# Patient Record
Sex: Female | Born: 1945
Health system: Southern US, Community
[De-identification: ages and names within clinical notes are randomized; demographics above are authoritative.]

## PROBLEM LIST (undated history)

## (undated) DIAGNOSIS — R0902 Hypoxemia: Secondary | ICD-10-CM

## (undated) DIAGNOSIS — G709 Myoneural disorder, unspecified: Secondary | ICD-10-CM

## (undated) DIAGNOSIS — G4733 Obstructive sleep apnea (adult) (pediatric): Secondary | ICD-10-CM

## (undated) DIAGNOSIS — E785 Hyperlipidemia, unspecified: Secondary | ICD-10-CM

## (undated) DIAGNOSIS — IMO0002 Reserved for concepts with insufficient information to code with codable children: Secondary | ICD-10-CM

## (undated) DIAGNOSIS — I219 Acute myocardial infarction, unspecified: Secondary | ICD-10-CM

## (undated) DIAGNOSIS — I4819 Other persistent atrial fibrillation: Secondary | ICD-10-CM

## (undated) DIAGNOSIS — M8580 Other specified disorders of bone density and structure, unspecified site: Secondary | ICD-10-CM

## (undated) DIAGNOSIS — G4730 Sleep apnea, unspecified: Secondary | ICD-10-CM

## (undated) DIAGNOSIS — K219 Gastro-esophageal reflux disease without esophagitis: Secondary | ICD-10-CM

## (undated) DIAGNOSIS — M1990 Unspecified osteoarthritis, unspecified site: Secondary | ICD-10-CM

## (undated) DIAGNOSIS — I639 Cerebral infarction, unspecified: Secondary | ICD-10-CM

## (undated) DIAGNOSIS — R32 Unspecified urinary incontinence: Secondary | ICD-10-CM

## (undated) DIAGNOSIS — H269 Unspecified cataract: Secondary | ICD-10-CM

## (undated) DIAGNOSIS — R739 Hyperglycemia, unspecified: Secondary | ICD-10-CM

## (undated) DIAGNOSIS — M3500 Sicca syndrome, unspecified: Secondary | ICD-10-CM

## (undated) DIAGNOSIS — Z9289 Personal history of other medical treatment: Secondary | ICD-10-CM

## (undated) DIAGNOSIS — C50919 Malignant neoplasm of unspecified site of unspecified female breast: Secondary | ICD-10-CM

## (undated) DIAGNOSIS — F329 Major depressive disorder, single episode, unspecified: Secondary | ICD-10-CM

## (undated) HISTORY — PX: TUBAL LIGATION: SHX77

## (undated) HISTORY — DX: Gastro-esophageal reflux disease without esophagitis: K21.9

## (undated) HISTORY — PX: JOINT REPLACEMENT: SHX530

## (undated) HISTORY — DX: Myoneural disorder, unspecified: G70.9

## (undated) HISTORY — PX: CATARACT EXTRACTION, BILATERAL: SHX1313

## (undated) HISTORY — DX: Unspecified osteoarthritis, unspecified site: M19.90

## (undated) HISTORY — DX: Major depressive disorder, single episode, unspecified: F32.9

## (undated) HISTORY — PX: EYE SURGERY: SHX253

## (undated) HISTORY — DX: Personal history of other medical treatment: Z92.89

## (undated) HISTORY — DX: Cerebral infarction, unspecified: I63.9

## (undated) HISTORY — DX: Sleep apnea, unspecified: G47.30

## (undated) HISTORY — DX: Acute myocardial infarction, unspecified: I21.9

## (undated) HISTORY — DX: Hyperlipidemia, unspecified: E78.5

## (undated) HISTORY — DX: Obstructive sleep apnea (adult) (pediatric): G47.33

## (undated) HISTORY — DX: Unspecified urinary incontinence: R32

## (undated) HISTORY — DX: Other persistent atrial fibrillation: I48.19

## (undated) HISTORY — DX: Sjogren syndrome, unspecified: M35.00

## (undated) HISTORY — PX: BILATERAL OOPHORECTOMY: SHX1221

## (undated) HISTORY — DX: Reserved for concepts with insufficient information to code with codable children: IMO0002

## (undated) HISTORY — DX: Hyperglycemia, unspecified: R73.9

## (undated) HISTORY — DX: Other specified disorders of bone density and structure, unspecified site: M85.80

## (undated) HISTORY — DX: Unspecified cataract: H26.9

## (undated) HISTORY — DX: Malignant neoplasm of unspecified site of unspecified female breast: C50.919

## (undated) HISTORY — DX: Hypoxemia: R09.02

## (undated) HISTORY — PX: BREAST SURGERY: SHX581

## (undated) SURGERY — CARDIOVERSION
Anesthesia: Moderate Sedation

---

## 1972-02-08 HISTORY — PX: TONSILLECTOMY: SUR1361

## 1989-02-07 HISTORY — PX: LUMBAR DISC SURGERY: SHX700

## 1999-02-08 HISTORY — PX: MASTECTOMY: SHX3

## 2000-02-08 DIAGNOSIS — C50919 Malignant neoplasm of unspecified site of unspecified female breast: Secondary | ICD-10-CM

## 2000-02-08 HISTORY — DX: Malignant neoplasm of unspecified site of unspecified female breast: C50.919

## 2003-03-11 HISTORY — PX: BILATERAL OOPHORECTOMY: SHX1221

## 2004-02-08 HISTORY — PX: TOTAL KNEE ARTHROPLASTY: SHX125

## 2005-02-07 DIAGNOSIS — M8580 Other specified disorders of bone density and structure, unspecified site: Secondary | ICD-10-CM

## 2005-02-07 HISTORY — DX: Other specified disorders of bone density and structure, unspecified site: M85.80

## 2005-06-07 HISTORY — PX: COLONOSCOPY: SHX174

## 2006-01-10 ENCOUNTER — Encounter: Admission: RE | Admit: 2006-01-10 | Discharge: 2006-01-10 | Payer: Self-pay | Admitting: Family Medicine

## 2006-01-10 IMAGING — CR DG LUMBAR SPINE COMPLETE 4+V
5 series · 5 of 5 positions shown · non-contrast
Comparison: None.

CLINICAL DATA: Low back and right leg pain.  
 LUMBAR SPINE, FOUR VIEWS:

[t l-spine a.p.]
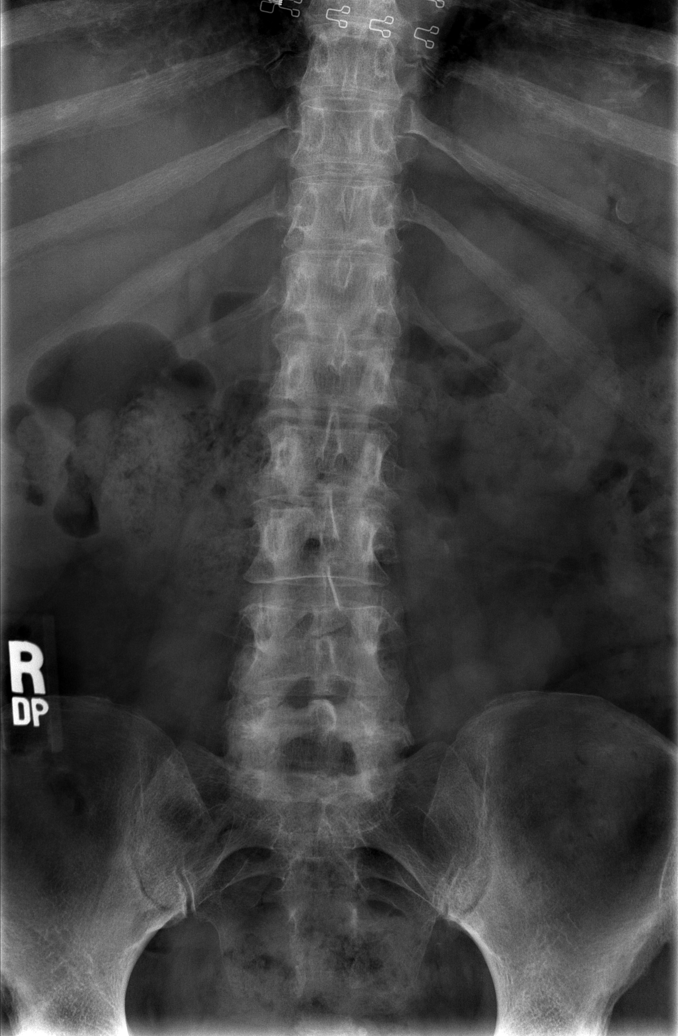

[t l-spine oblique exposure (1 of 2)]
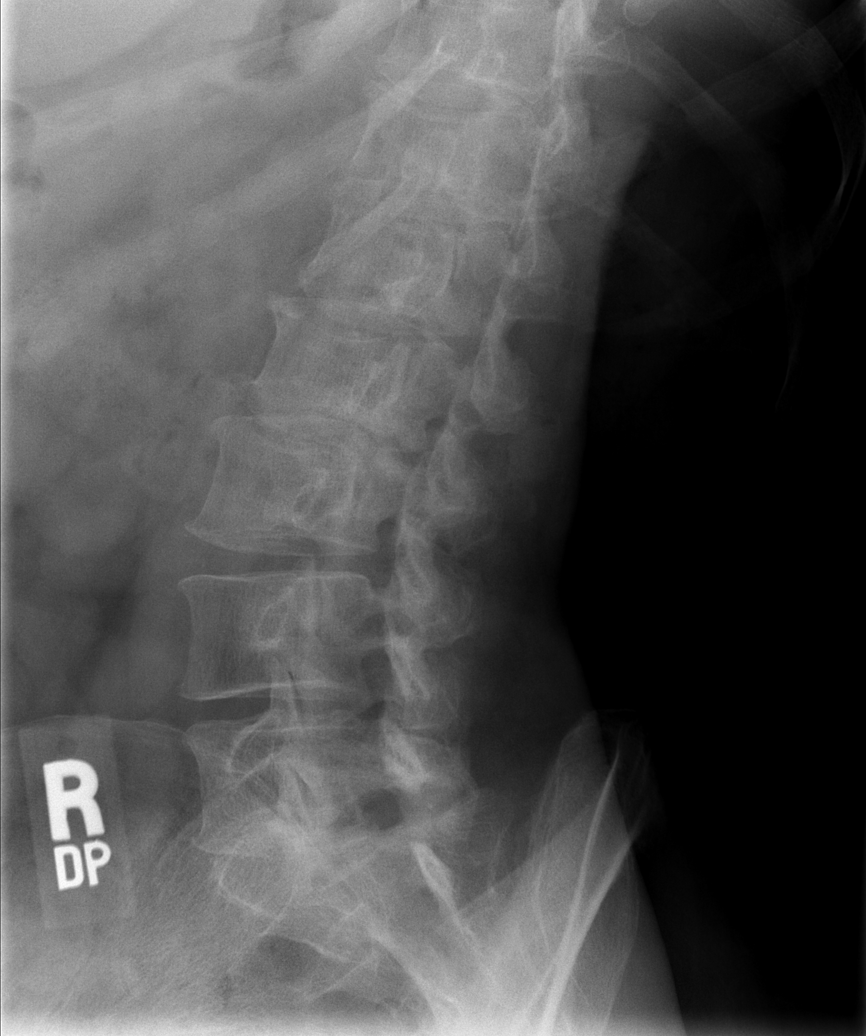

[t l-spine oblique exposure (2 of 2)]
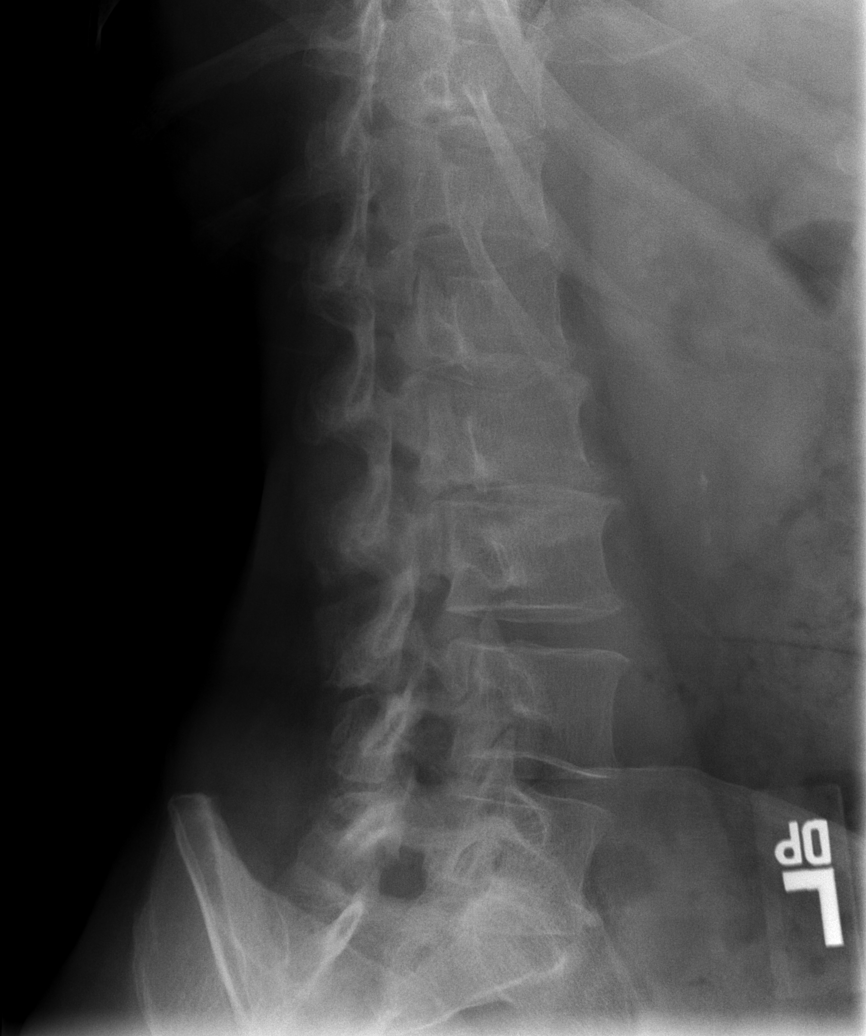

[t l-spine lat]
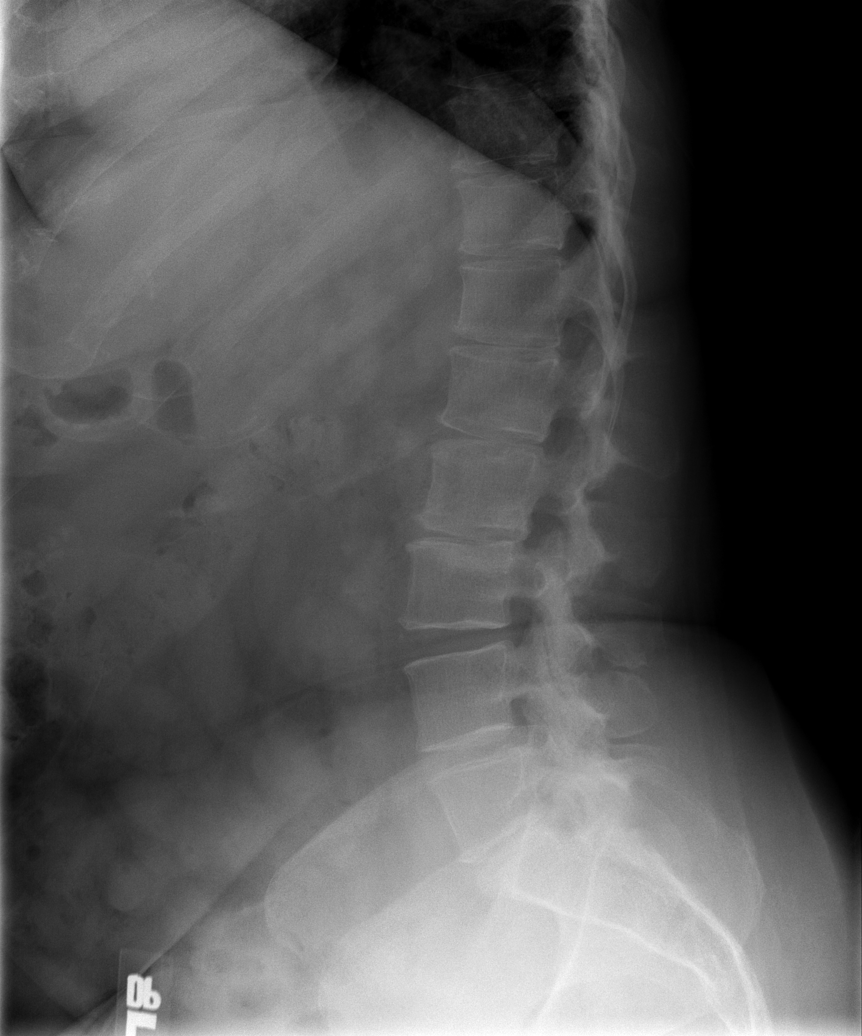

[t l-spine l5-s1 spot]
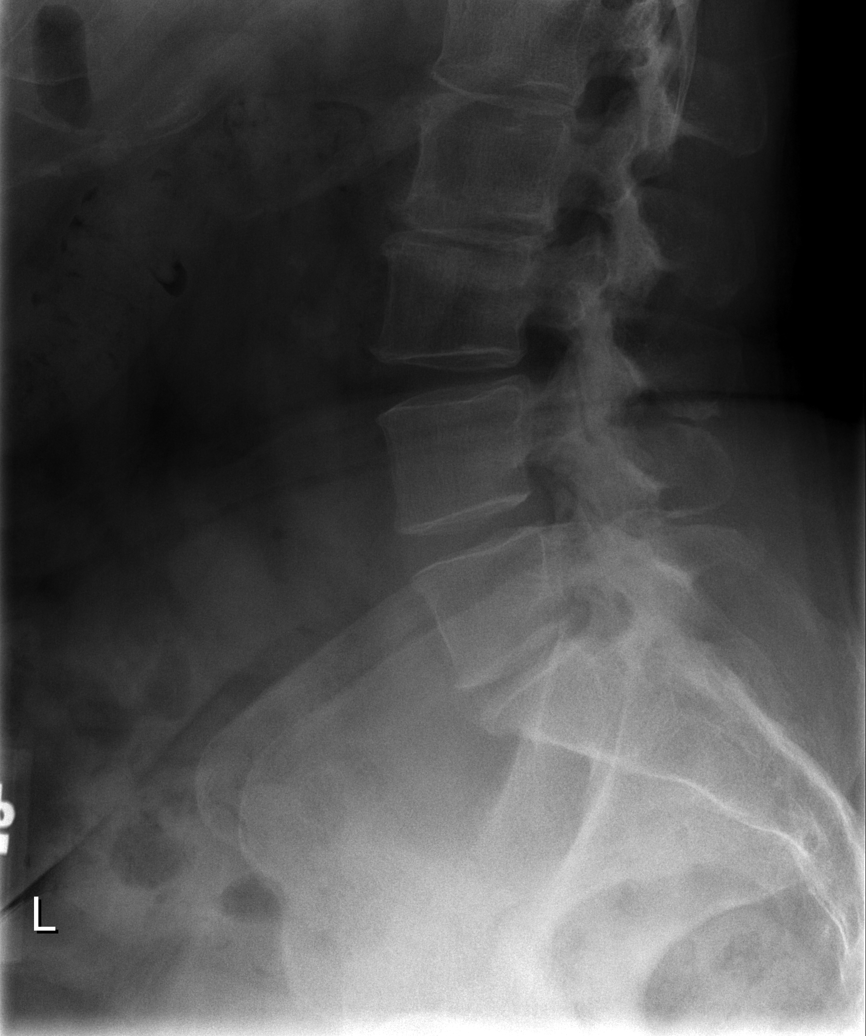

[5 of 5 positions shown; findings below may reference images not displayed]

Five non-rib-bearing lumbar vertebrae are seen.  Moderate degenerative disk disease is seen at L5-S1 and L2-3.  Posterior vertebral alignment is normally maintained. Slight left L5-S1 facet degenerative joint disease is seen.  Diffuse osteopenia is noted.  No other significant radiographic abnormality is seen.
IMPRESSION: 1.  Moderate degenerative disk disease at L2-3 and L5-S1.
 2.  Diffuse osteopenia.
 3.  Slight left L5-S1 facet degenerative joint disease.
 4.  Otherwise no significant abnormality.

## 2006-11-01 ENCOUNTER — Ambulatory Visit: Payer: Self-pay | Admitting: Hematology & Oncology

## 2006-11-13 ENCOUNTER — Ambulatory Visit: Payer: Self-pay | Admitting: Oncology

## 2006-11-14 ENCOUNTER — Encounter: Payer: Self-pay | Admitting: Family Medicine

## 2006-11-14 LAB — CBC WITH DIFFERENTIAL (CANCER CENTER ONLY)
BASO#: 0 10*3/uL (ref 0.0–0.2)
BASO%: 0.5 % (ref 0.0–2.0)
EOS%: 3.4 % (ref 0.0–7.0)
Eosinophils Absolute: 0.3 10*3/uL (ref 0.0–0.5)
HCT: 41.2 % (ref 34.8–46.6)
HGB: 13.7 g/dL (ref 11.6–15.9)
LYMPH#: 1.6 10*3/uL (ref 0.9–3.3)
LYMPH%: 19.6 % (ref 14.0–48.0)
MCH: 27.5 pg (ref 26.0–34.0)
MCHC: 33.2 g/dL (ref 32.0–36.0)
MCV: 83 fL (ref 81–101)
MONO#: 0.4 10*3/uL (ref 0.1–0.9)
MONO%: 5.3 % (ref 0.0–13.0)
NEUT#: 5.8 10*3/uL (ref 1.5–6.5)
NEUT%: 71.2 % (ref 39.6–80.0)
Platelets: 214 10*3/uL (ref 145–400)
RBC: 4.97 10*6/uL (ref 3.70–5.32)
RDW: 14.3 % (ref 10.5–14.6)
WBC: 8.2 10*3/uL (ref 3.9–10.0)

## 2006-11-15 LAB — CANCER ANTIGEN 27.29: CA 27.29: 19 U/mL (ref 0–39)

## 2006-11-15 LAB — LACTATE DEHYDROGENASE: LDH: 153 U/L (ref 94–250)

## 2006-11-15 LAB — COMPREHENSIVE METABOLIC PANEL
ALT: 24 U/L (ref 0–35)
AST: 25 U/L (ref 0–37)
Albumin: 3.6 g/dL (ref 3.5–5.2)
Alkaline Phosphatase: 71 U/L (ref 39–117)
BUN: 18 mg/dL (ref 6–23)
CO2: 26 mEq/L (ref 19–32)
Calcium: 9.3 mg/dL (ref 8.4–10.5)
Chloride: 103 mEq/L (ref 96–112)
Creatinine, Ser: 0.71 mg/dL (ref 0.40–1.20)
Glucose, Bld: 119 mg/dL — ABNORMAL HIGH (ref 70–99)
Potassium: 3.8 mEq/L (ref 3.5–5.3)
Sodium: 138 mEq/L (ref 135–145)
Total Bilirubin: 0.4 mg/dL (ref 0.3–1.2)
Total Protein: 6.7 g/dL (ref 6.0–8.3)

## 2007-10-09 DIAGNOSIS — R739 Hyperglycemia, unspecified: Secondary | ICD-10-CM

## 2007-10-09 HISTORY — DX: Hyperglycemia, unspecified: R73.9

## 2007-10-17 ENCOUNTER — Encounter: Payer: Self-pay | Admitting: Family Medicine

## 2007-10-17 LAB — CONVERTED CEMR LAB: Pap Smear: NORMAL

## 2007-11-06 ENCOUNTER — Ambulatory Visit: Payer: Self-pay | Admitting: Oncology

## 2007-11-09 LAB — CBC WITH DIFFERENTIAL (CANCER CENTER ONLY)
BASO#: 0 10*3/uL (ref 0.0–0.2)
BASO%: 0.6 % (ref 0.0–2.0)
EOS%: 2.5 % (ref 0.0–7.0)
Eosinophils Absolute: 0.2 10*3/uL (ref 0.0–0.5)
HCT: 40.3 % (ref 34.8–46.6)
HGB: 13.9 g/dL (ref 11.6–15.9)
LYMPH#: 1.5 10*3/uL (ref 0.9–3.3)
LYMPH%: 22.9 % (ref 14.0–48.0)
MCH: 28.9 pg (ref 26.0–34.0)
MCHC: 34.5 g/dL (ref 32.0–36.0)
MCV: 84 fL (ref 81–101)
MONO#: 0.4 10*3/uL (ref 0.1–0.9)
MONO%: 6.6 % (ref 0.0–13.0)
NEUT#: 4.5 10*3/uL (ref 1.5–6.5)
NEUT%: 67.4 % (ref 39.6–80.0)
Platelets: 189 10*3/uL (ref 145–400)
RBC: 4.8 10*6/uL (ref 3.70–5.32)
RDW: 13.9 % (ref 10.5–14.6)
WBC: 6.6 10*3/uL (ref 3.9–10.0)

## 2007-11-09 LAB — COMPREHENSIVE METABOLIC PANEL
ALT: 36 U/L — ABNORMAL HIGH (ref 0–35)
AST: 26 U/L (ref 0–37)
Albumin: 4.1 g/dL (ref 3.5–5.2)
Alkaline Phosphatase: 69 U/L (ref 39–117)
BUN: 16 mg/dL (ref 6–23)
CO2: 26 mEq/L (ref 19–32)
Calcium: 9.1 mg/dL (ref 8.4–10.5)
Chloride: 103 mEq/L (ref 96–112)
Creatinine, Ser: 0.68 mg/dL (ref 0.40–1.20)
Glucose, Bld: 77 mg/dL (ref 70–99)
Potassium: 4.1 mEq/L (ref 3.5–5.3)
Sodium: 139 mEq/L (ref 135–145)
Total Bilirubin: 0.3 mg/dL (ref 0.3–1.2)
Total Protein: 6.9 g/dL (ref 6.0–8.3)

## 2007-11-09 LAB — LACTATE DEHYDROGENASE: LDH: 169 U/L (ref 94–250)

## 2007-11-09 LAB — CANCER ANTIGEN 27.29: CA 27.29: 19 U/mL (ref 0–39)

## 2008-04-22 ENCOUNTER — Ambulatory Visit: Payer: Self-pay | Admitting: Family Medicine

## 2008-04-22 DIAGNOSIS — R7303 Prediabetes: Secondary | ICD-10-CM | POA: Insufficient documentation

## 2008-04-22 DIAGNOSIS — K219 Gastro-esophageal reflux disease without esophagitis: Secondary | ICD-10-CM | POA: Insufficient documentation

## 2008-04-22 DIAGNOSIS — E559 Vitamin D deficiency, unspecified: Secondary | ICD-10-CM | POA: Insufficient documentation

## 2008-04-22 DIAGNOSIS — E785 Hyperlipidemia, unspecified: Secondary | ICD-10-CM | POA: Insufficient documentation

## 2008-04-23 DIAGNOSIS — M3500 Sicca syndrome, unspecified: Secondary | ICD-10-CM | POA: Insufficient documentation

## 2008-04-23 LAB — CONVERTED CEMR LAB
Cholesterol: 171 mg/dL (ref 0–200)
Creatinine, Ser: 0.6 mg/dL (ref 0.4–1.2)
Glucose, Bld: 92 mg/dL (ref 70–99)
HDL: 44.5 mg/dL (ref >39.00)
Hgb A1c MFr Bld: 5.8 % (ref 4.6–6.5)
LDL Cholesterol: 107 mg/dL — ABNORMAL HIGH (ref 0–99)
Phosphorus: 4 mg/dL (ref 2.3–4.6)
Potassium: 4.4 meq/L (ref 3.5–5.1)
Sodium: 141 meq/L (ref 135–145)
Total CHOL/HDL Ratio: 4
Triglycerides: 97 mg/dL (ref 0.0–149.0)
VLDL: 19.4 mg/dL (ref 0.0–40.0)

## 2008-04-24 LAB — CONVERTED CEMR LAB: Vit D, 25-Hydroxy: 51 ng/mL (ref 30–89)

## 2008-04-30 ENCOUNTER — Encounter: Payer: Self-pay | Admitting: Family Medicine

## 2008-05-28 ENCOUNTER — Ambulatory Visit: Payer: Self-pay | Admitting: Family Medicine

## 2008-05-29 ENCOUNTER — Encounter: Admission: RE | Admit: 2008-05-29 | Discharge: 2008-05-29 | Payer: Self-pay | Admitting: Family Medicine

## 2008-05-29 IMAGING — CR DG LUMBAR SPINE COMPLETE 4+V
5 series · 5 of 5 positions shown · non-contrast
Comparison: [DATE]

CLINICAL DATA: Back pain.

LUMBAR SPINE - COMPLETE 4+ VIEW

[t l-spine a.p.]
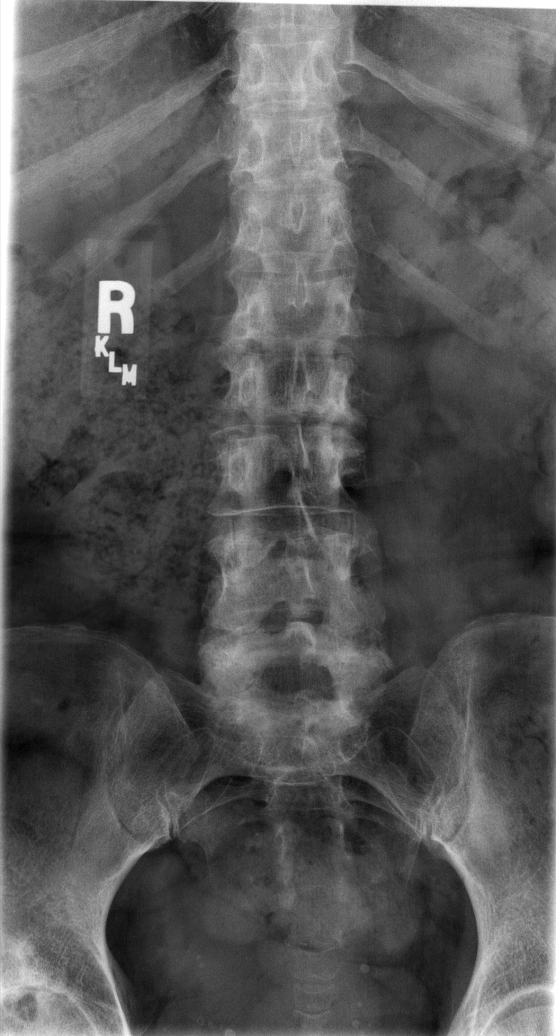

[t l-spine oblique exposure (1 of 2)]
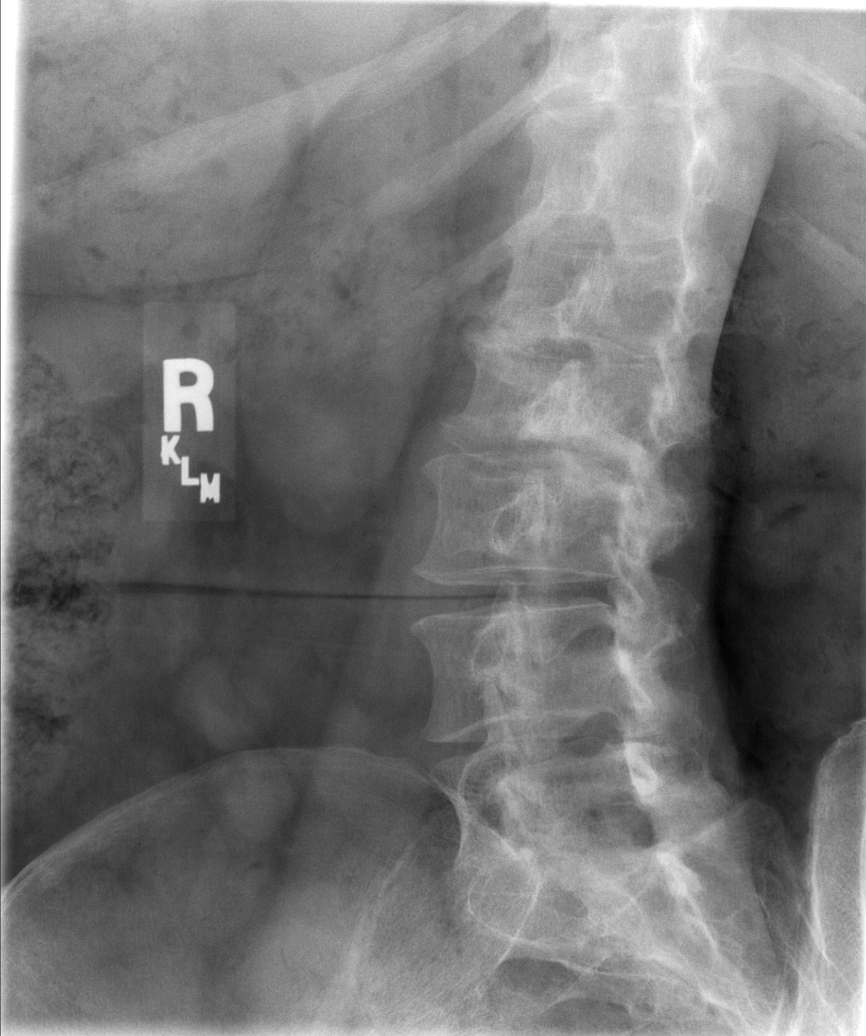

[t l-spine oblique exposure (2 of 2)]
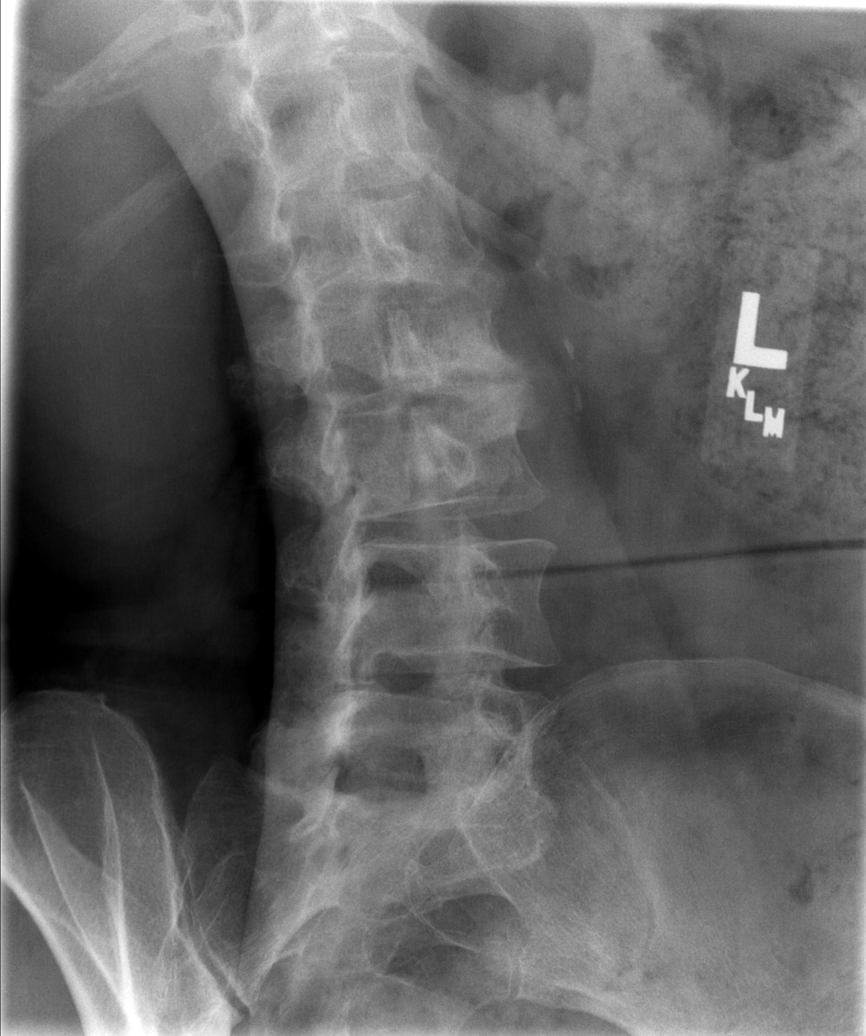

[t l-spine lat]
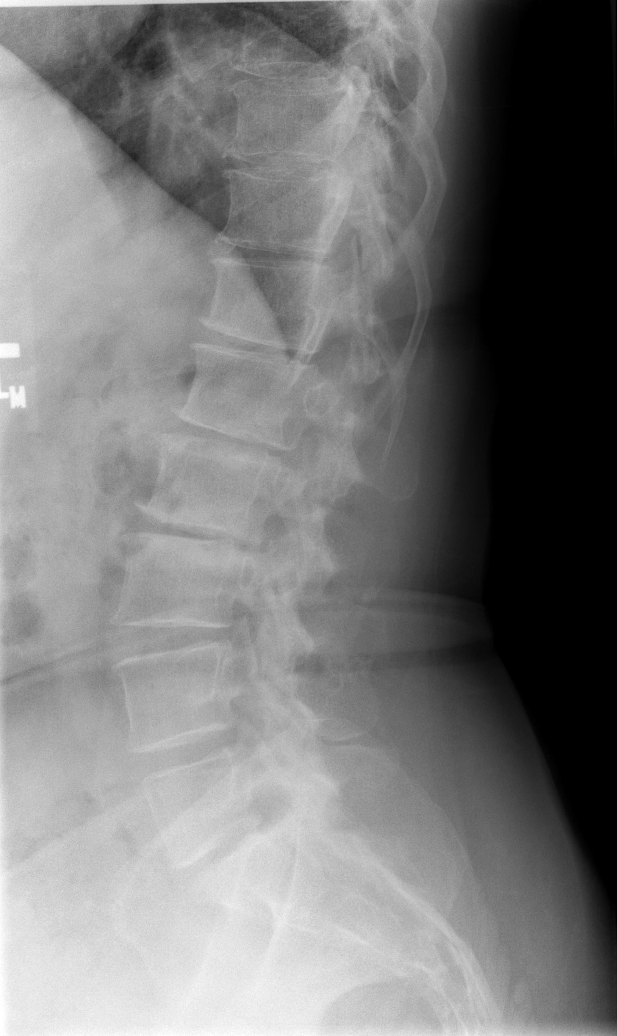

[t l-spine l5-s1 spot]
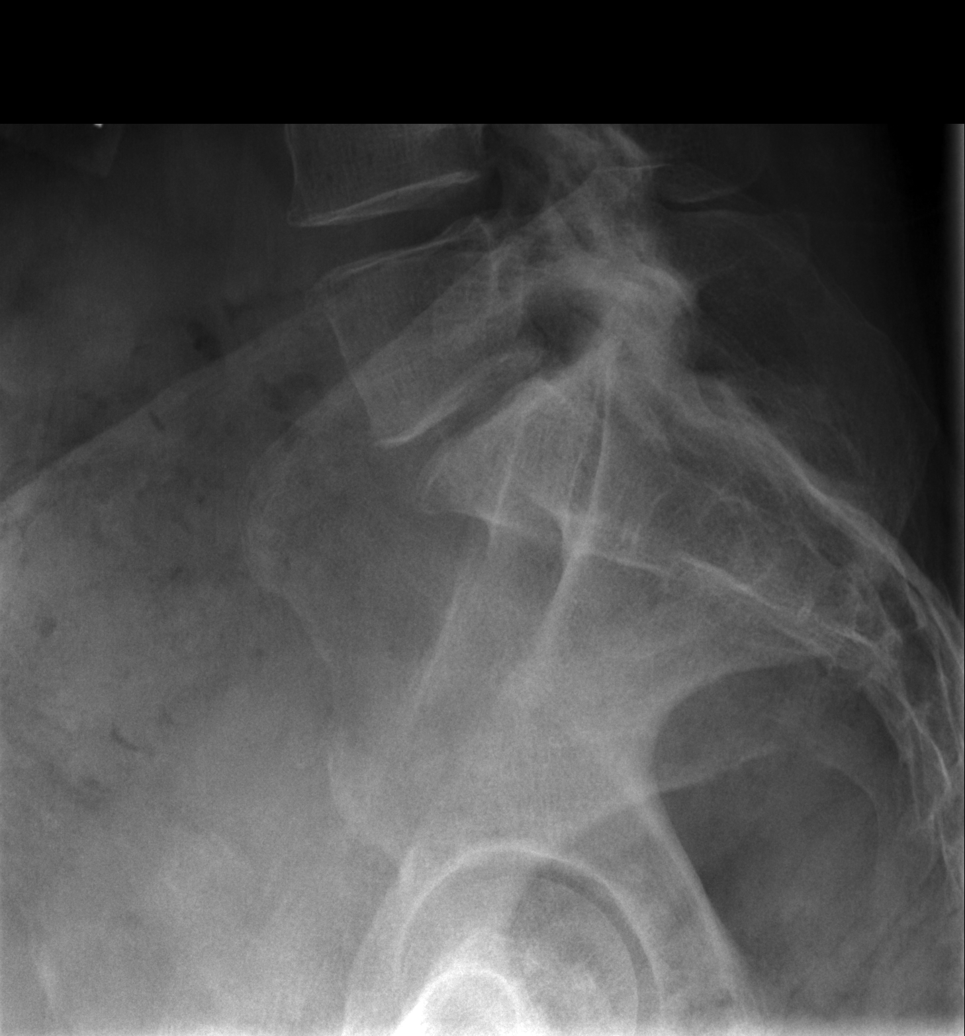

[5 of 5 positions shown; findings below may reference images not displayed]

FINDINGS: The lateral film demonstrates normal overall alignment of
the lumbar vertebral bodies.  Stable degenerative disc disease at
L1-2 and L2-3.  No acute bony findings or destructive bony changes.
Moderate facet disease is relatively stable.  No definite pars
defects.  The visualized bony pelvis is intact.  Advanced
degenerative changes are noted involving the right hip.  Aortic
calcifications are noted without definite aneurysm.
IMPRESSION: 1.  Normal alignment and no acute bony findings.
2.  Degenerative disc disease mainly in the upper lumbar spine.
3.  Moderate facet disease without definite pars defects.
4.  Advanced degenerative changes involving the right hip.

## 2008-05-29 IMAGING — CR DG HIP COMPLETE 2+V*R*
2 series · 2 of 2 positions shown · non-contrast
Comparison: None

CLINICAL DATA: Right hip pain.

RIGHT HIP - COMPLETE 2+ VIEW

[t hip ap right]
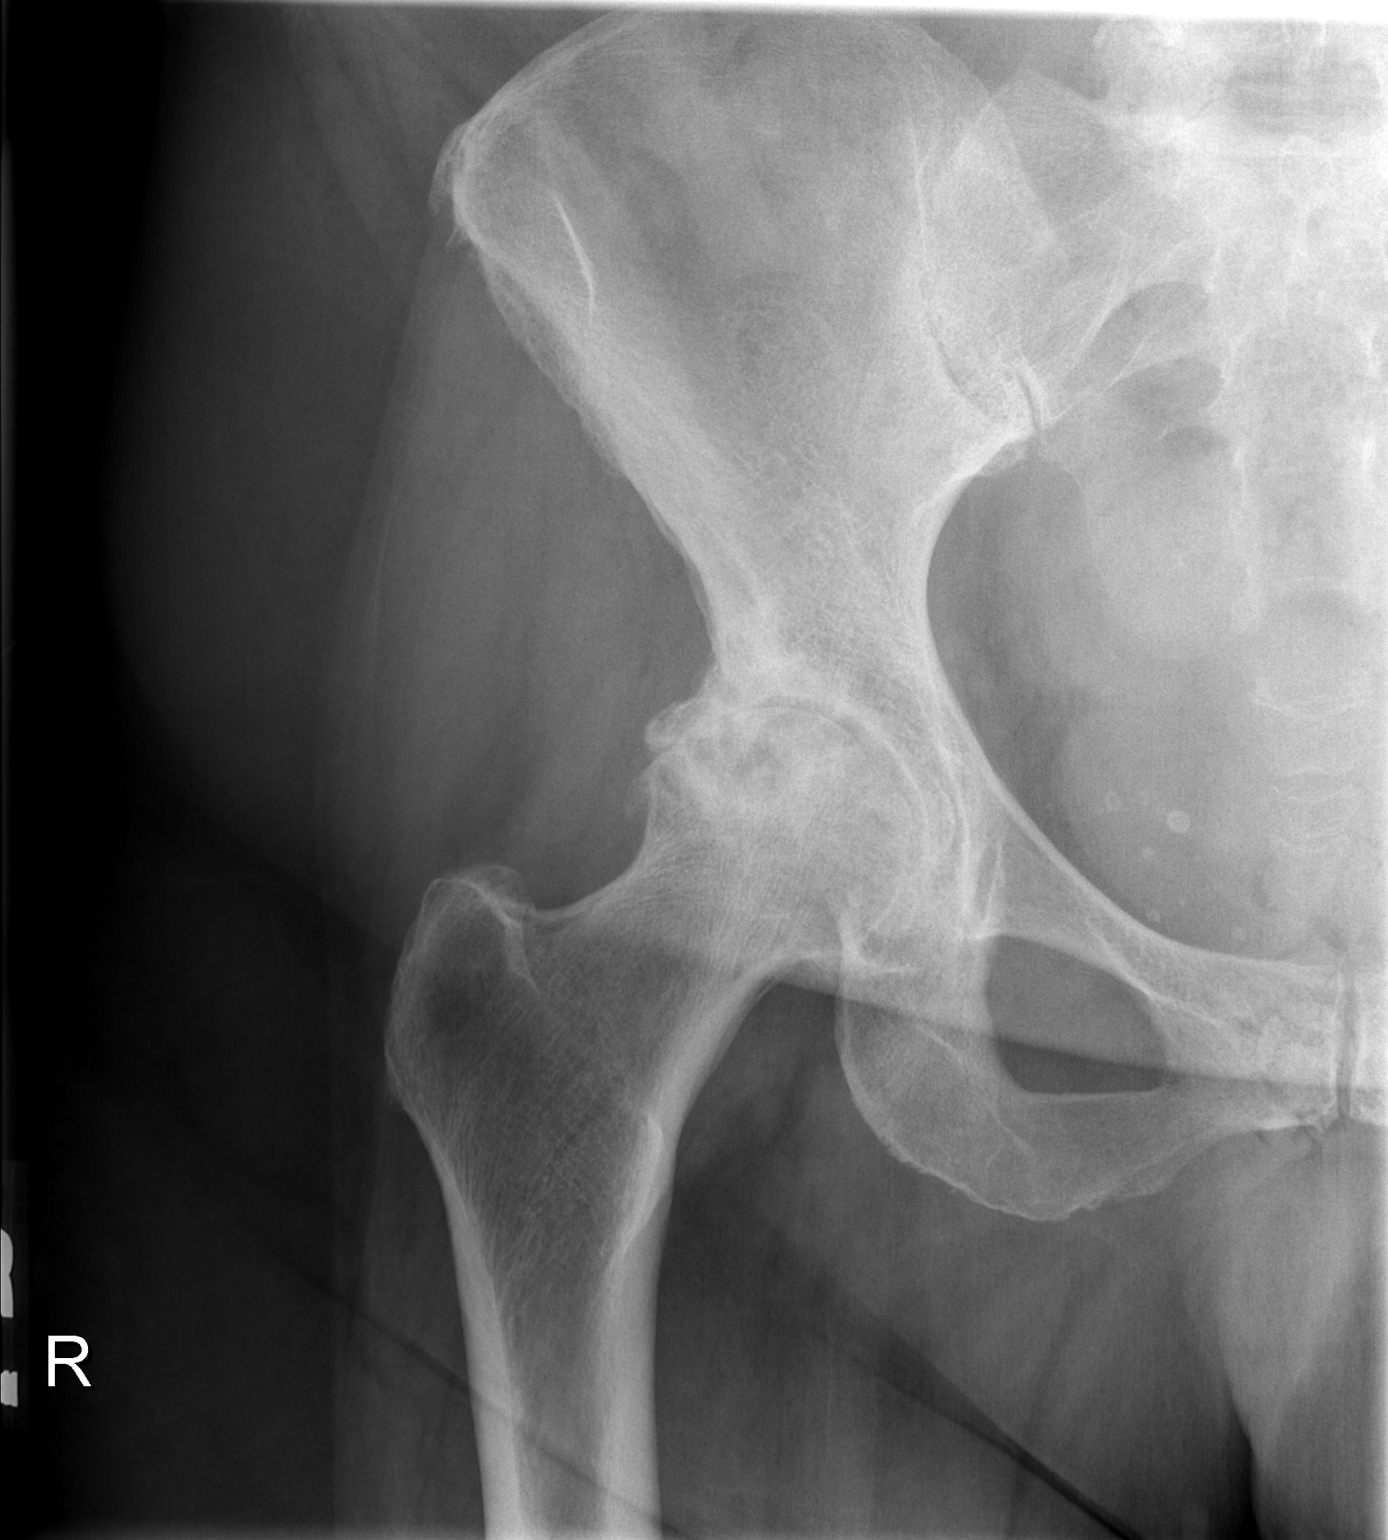

[t hip frog leg right]
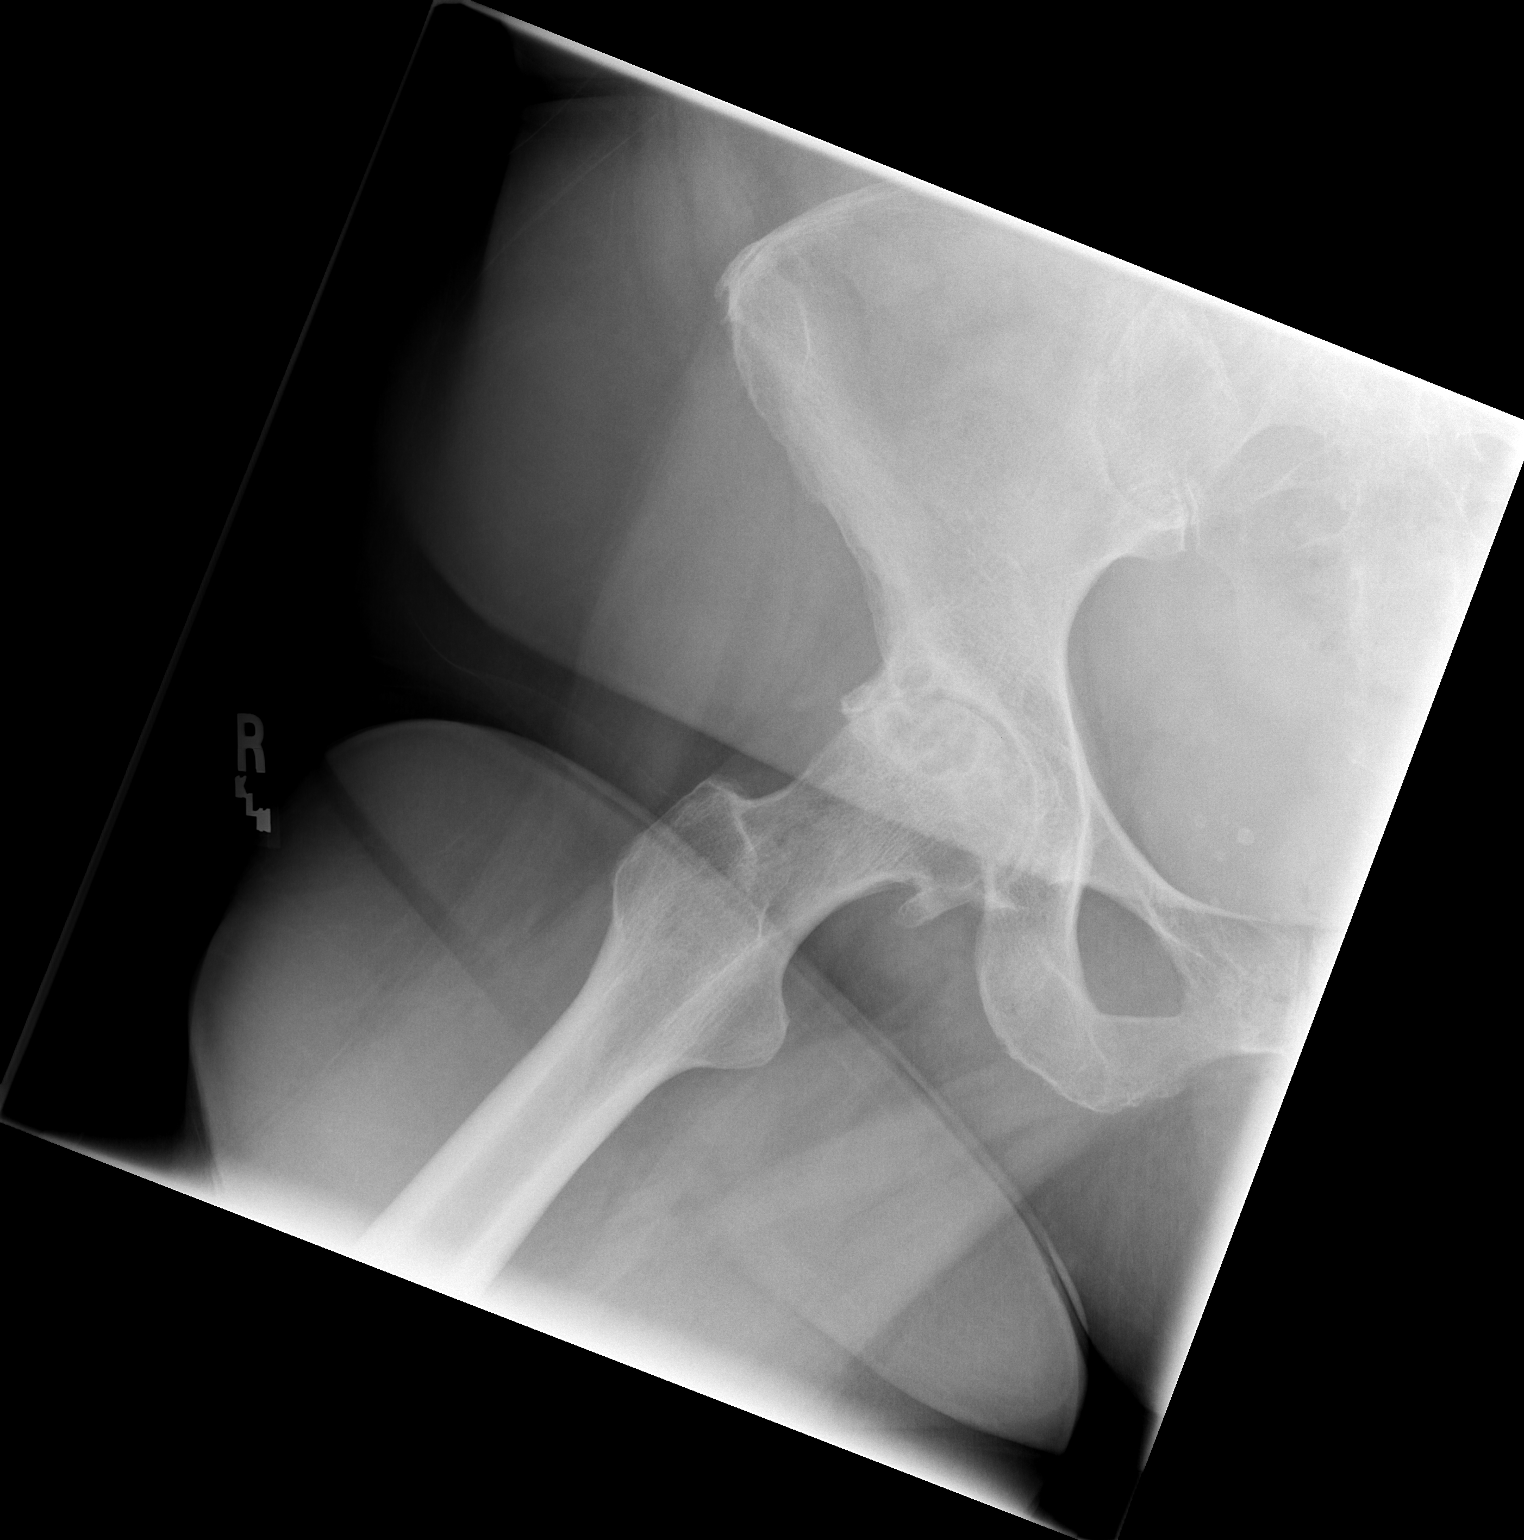

[2 of 2 positions shown; findings below may reference images not displayed]

FINDINGS: There are significant osteoarthritic type degenerative
changes involving the right hip with joint space narrowing,
osteophytic spurring and subchondral cystic change.  The pubic
symphysis is intact.  The visualized bony pelvis is intact.
IMPRESSION: Significant osteoarthritic type degenerative changes involving the
right hip.

## 2008-06-13 ENCOUNTER — Encounter: Admission: RE | Admit: 2008-06-13 | Discharge: 2008-06-13 | Payer: Self-pay | Admitting: Sports Medicine

## 2008-06-25 ENCOUNTER — Ambulatory Visit: Payer: Self-pay | Admitting: Family Medicine

## 2008-07-02 ENCOUNTER — Encounter: Payer: Self-pay | Admitting: Family Medicine

## 2008-08-14 ENCOUNTER — Encounter: Payer: Self-pay | Admitting: Family Medicine

## 2008-09-15 ENCOUNTER — Telehealth: Payer: Self-pay | Admitting: Family Medicine

## 2008-09-16 ENCOUNTER — Encounter: Admission: RE | Admit: 2008-09-16 | Discharge: 2008-09-16 | Payer: Self-pay | Admitting: Orthopedic Surgery

## 2008-09-16 IMAGING — US US EXTREM LOW VENOUS BILAT
1 series · 14 of 24 positions shown · non-contrast
Comparison: None

CLINICAL DATA: Lower leg pain and swelling status post total joint
replacement

VENOUS DUPLEX ULTRASOUND OF BILATERAL LOWER EXTREMITIES
TECHNIQUE: Gray-scale sonography with graded compression, as well
as color Doppler and duplex ultrasound, were performed to evaluate
the deep venous system of both lower extremities from the level of
the common femoral vein through the popliteal and proximal calf
veins. Spectral Doppler was utilized to evaluate flow at rest and
with distal augmentation maneuvers.

[Series 1: us extrem low venous bilat · 14 of 61 slices shown]
[im 1/61]
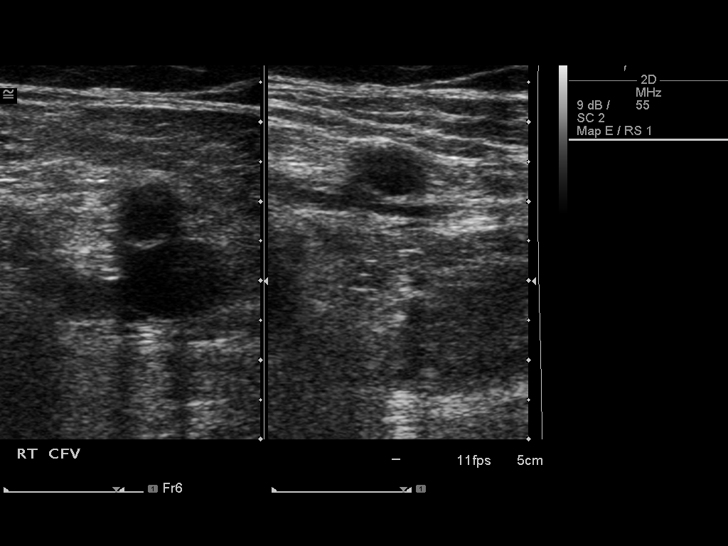
[im 6/61]
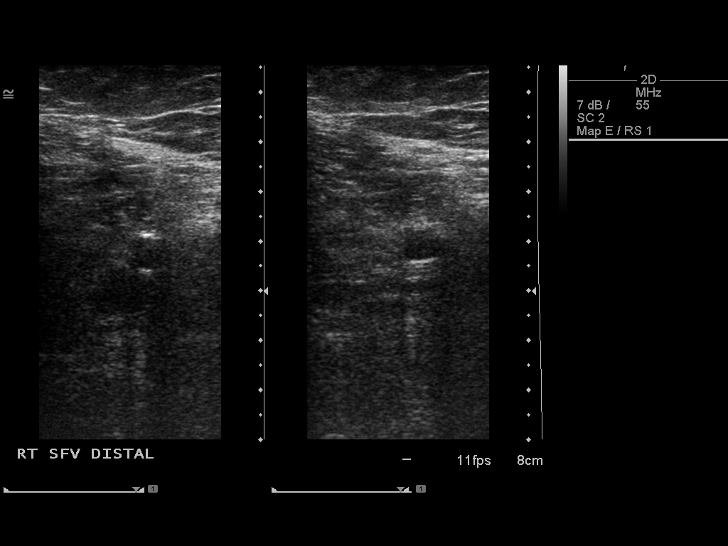
[im 11/61]
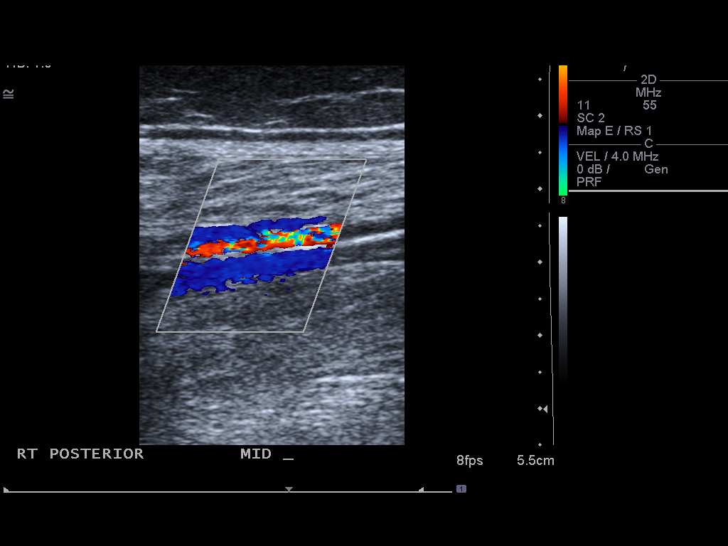
[im 16/61]
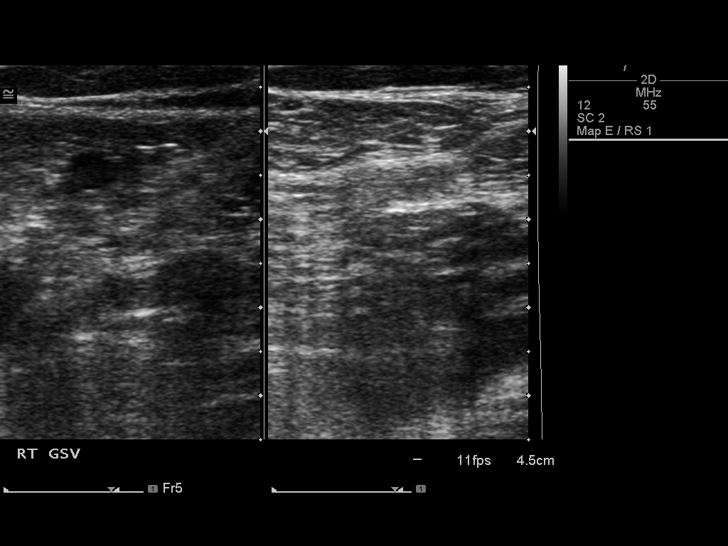
[im 19/61]
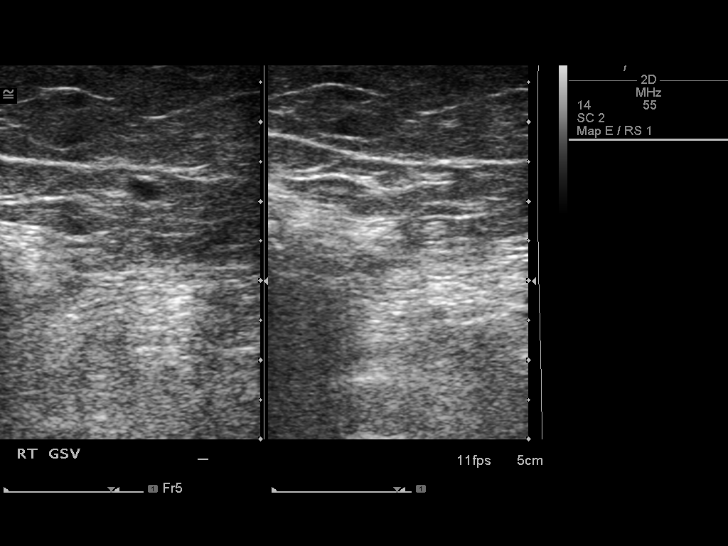
[im 24/61]
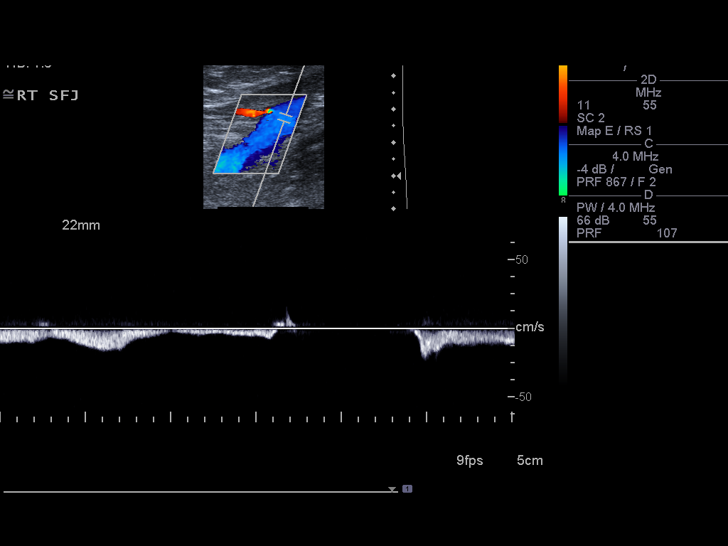
[im 29/61]
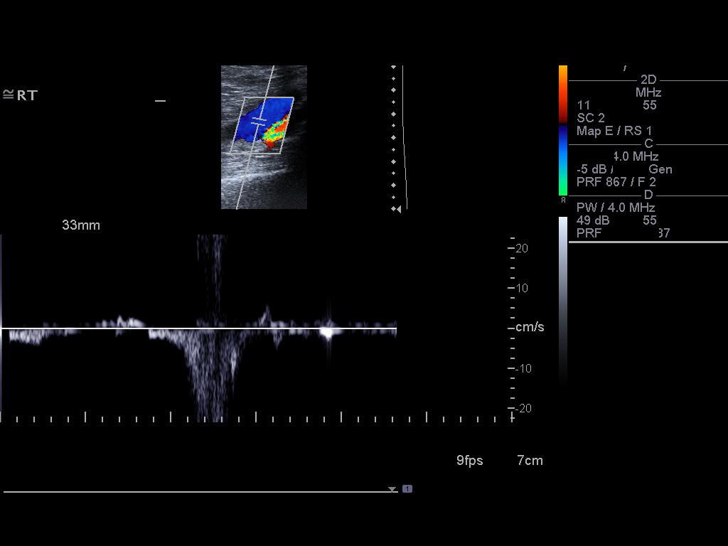
[im 32/61]
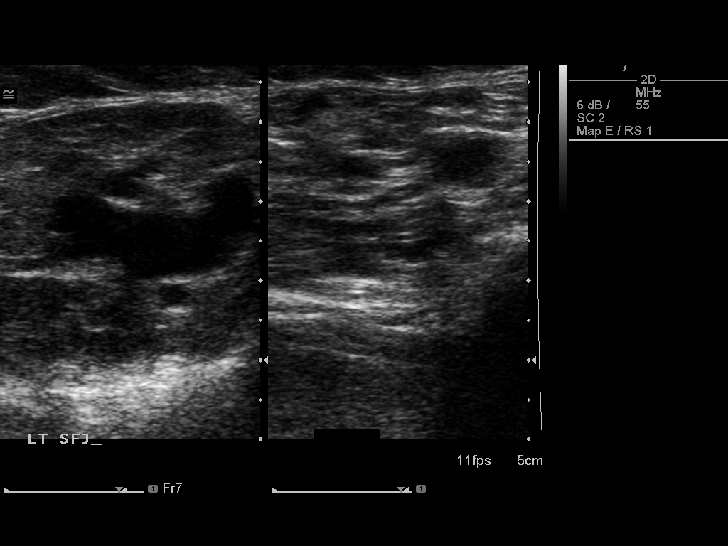
[im 37/61]
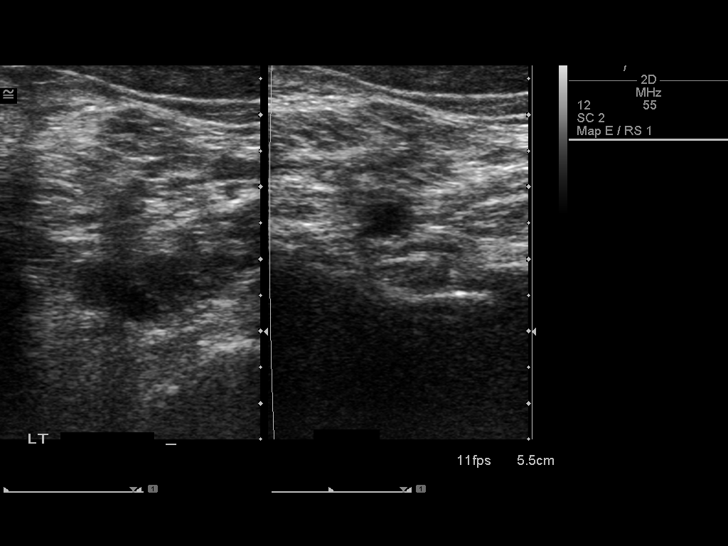
[im 42/61]
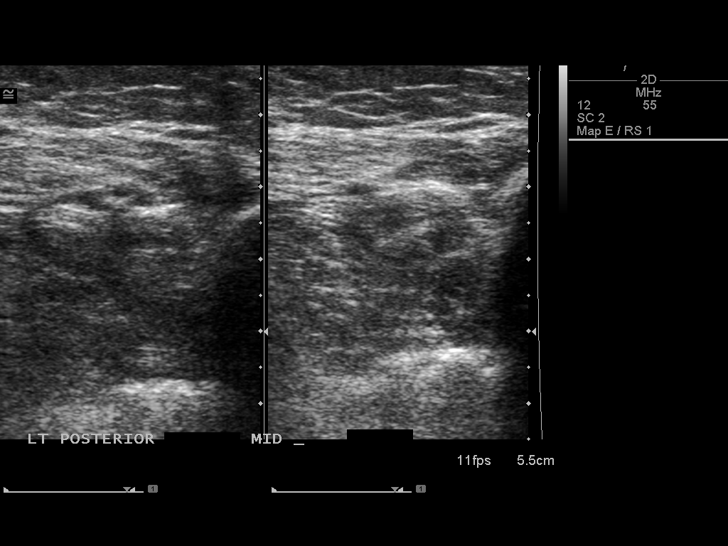
[im 47/61]
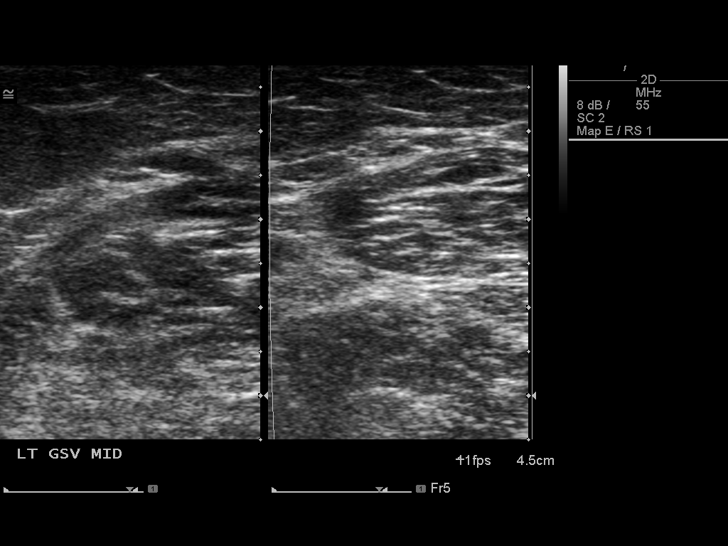
[im 50/61]
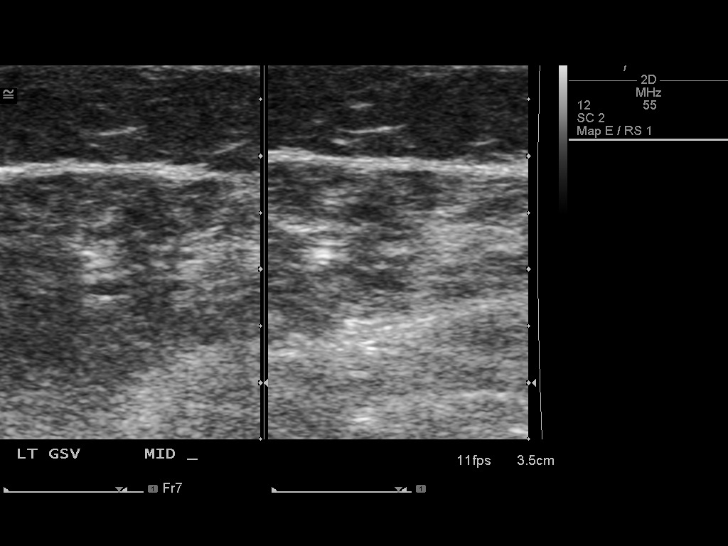
[im 55/61]
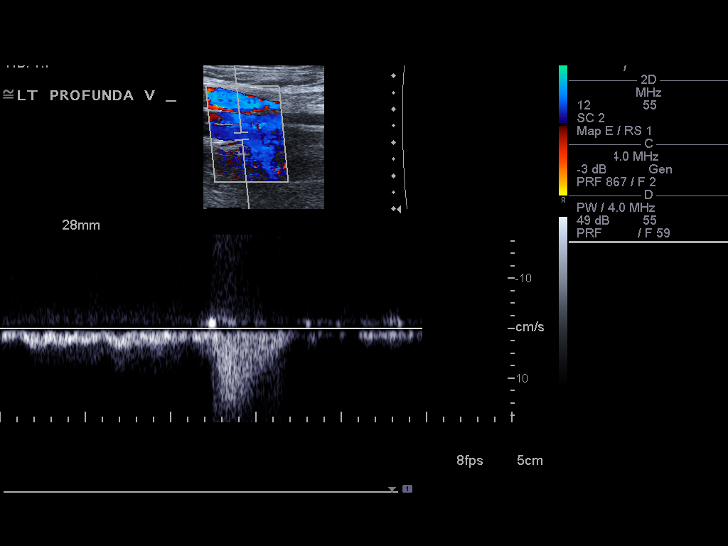
[im 61/61]
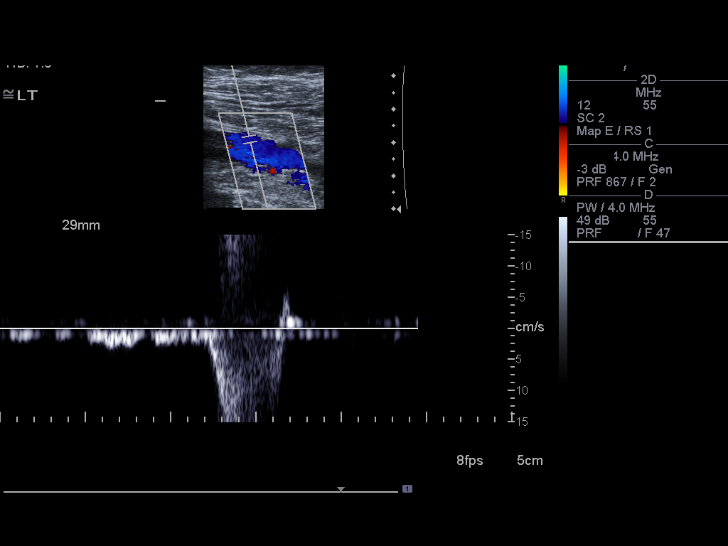

[14 of 24 positions shown; findings below may reference images not displayed]

FINDINGS: Ultrasound examination bilateral lower extremity
demonstrate the visualized deep veins to be patent with good
augmentation and compressibility noted.  There is no evidence of
deep vein thrombosis.
IMPRESSION: No evidence of deep vein thrombosis bilateral lower extremity.

## 2008-09-26 ENCOUNTER — Encounter: Payer: Self-pay | Admitting: Family Medicine

## 2008-10-22 ENCOUNTER — Encounter: Payer: Self-pay | Admitting: Family Medicine

## 2008-12-12 ENCOUNTER — Ambulatory Visit: Payer: Self-pay | Admitting: Family Medicine

## 2008-12-29 ENCOUNTER — Ambulatory Visit: Payer: Self-pay | Admitting: Family Medicine

## 2008-12-29 LAB — HM DEXA SCAN: HM Dexa Scan: NORMAL

## 2009-01-14 ENCOUNTER — Encounter: Payer: Self-pay | Admitting: Family Medicine

## 2009-02-07 HISTORY — PX: TOTAL HIP ARTHROPLASTY: SHX124

## 2009-02-11 ENCOUNTER — Ambulatory Visit: Payer: Self-pay | Admitting: Family Medicine

## 2009-02-12 ENCOUNTER — Telehealth: Payer: Self-pay | Admitting: Family Medicine

## 2009-02-12 LAB — CONVERTED CEMR LAB
ALT: 11 units/L (ref 0–35)
AST: 18 units/L (ref 0–37)
Albumin: 3.6 g/dL (ref 3.5–5.2)
Alkaline Phosphatase: 70 units/L (ref 39–117)
BUN: 13 mg/dL (ref 6–23)
Basophils Absolute: 0 10*3/uL (ref 0.0–0.1)
Basophils Relative: 0.4 % (ref 0.0–3.0)
Bilirubin, Direct: 0.1 mg/dL (ref 0.0–0.3)
CO2: 30 meq/L (ref 19–32)
Calcium: 9.2 mg/dL (ref 8.4–10.5)
Chloride: 103 meq/L (ref 96–112)
Cholesterol: 254 mg/dL — ABNORMAL HIGH (ref 0–200)
Creatinine, Ser: 0.5 mg/dL (ref 0.4–1.2)
Direct LDL: 184.7 mg/dL
Eosinophils Absolute: 0.2 10*3/uL (ref 0.0–0.7)
Eosinophils Relative: 2.9 % (ref 0.0–5.0)
GFR calc non Af Amer: 132.1 mL/min (ref >60)
Glucose, Bld: 88 mg/dL (ref 70–99)
HCT: 41.9 % (ref 36.0–46.0)
HDL: 54.3 mg/dL (ref >39.00)
Hemoglobin: 13.7 g/dL (ref 12.0–15.0)
Hgb A1c MFr Bld: 5.8 % (ref 4.6–6.5)
Lymphocytes Relative: 23.9 % (ref 12.0–46.0)
Lymphs Abs: 1.2 10*3/uL (ref 0.7–4.0)
MCHC: 32.7 g/dL (ref 30.0–36.0)
MCV: 91.5 fL (ref 78.0–100.0)
Monocytes Absolute: 0.4 10*3/uL (ref 0.1–1.0)
Monocytes Relative: 6.8 % (ref 3.0–12.0)
Neutro Abs: 3.4 10*3/uL (ref 1.4–7.7)
Neutrophils Relative %: 66 % (ref 43.0–77.0)
Platelets: 157 10*3/uL (ref 150.0–400.0)
Potassium: 4.2 meq/L (ref 3.5–5.1)
RBC: 4.58 M/uL (ref 3.87–5.11)
RDW: 13.8 % (ref 11.5–14.6)
Sodium: 138 meq/L (ref 135–145)
TSH: 1.2 microintl units/mL (ref 0.35–5.50)
Total Bilirubin: 0.8 mg/dL (ref 0.3–1.2)
Total CHOL/HDL Ratio: 5
Total Protein: 7 g/dL (ref 6.0–8.3)
Triglycerides: 102 mg/dL (ref 0.0–149.0)
VLDL: 20.4 mg/dL (ref 0.0–40.0)
WBC: 5.2 10*3/uL (ref 4.5–10.5)

## 2009-02-13 LAB — CONVERTED CEMR LAB: Vit D, 25-Hydroxy: 34 ng/mL (ref 30–89)

## 2009-02-19 ENCOUNTER — Ambulatory Visit: Payer: Self-pay | Admitting: Family Medicine

## 2009-04-30 ENCOUNTER — Encounter (INDEPENDENT_AMBULATORY_CARE_PROVIDER_SITE_OTHER): Payer: Self-pay | Admitting: *Deleted

## 2009-06-04 ENCOUNTER — Telehealth: Payer: Self-pay | Admitting: Gastroenterology

## 2009-06-04 ENCOUNTER — Encounter (INDEPENDENT_AMBULATORY_CARE_PROVIDER_SITE_OTHER): Payer: Self-pay | Admitting: *Deleted

## 2009-06-05 ENCOUNTER — Ambulatory Visit: Payer: Self-pay | Admitting: Gastroenterology

## 2009-06-11 ENCOUNTER — Ambulatory Visit: Payer: Self-pay | Admitting: Gastroenterology

## 2009-06-11 LAB — HM COLONOSCOPY

## 2009-06-16 ENCOUNTER — Encounter: Payer: Self-pay | Admitting: Gastroenterology

## 2009-09-09 ENCOUNTER — Encounter: Payer: Self-pay | Admitting: Family Medicine

## 2010-01-08 ENCOUNTER — Telehealth (INDEPENDENT_AMBULATORY_CARE_PROVIDER_SITE_OTHER): Payer: Self-pay | Admitting: *Deleted

## 2010-01-12 ENCOUNTER — Ambulatory Visit: Payer: Self-pay | Admitting: Family Medicine

## 2010-01-12 LAB — CONVERTED CEMR LAB
ALT: 13 units/L (ref 0–35)
AST: 19 units/L (ref 0–37)
Albumin: 4.1 g/dL (ref 3.5–5.2)
Alkaline Phosphatase: 70 units/L (ref 39–117)
BUN: 16 mg/dL (ref 6–23)
Basophils Absolute: 0 10*3/uL (ref 0.0–0.1)
Basophils Relative: 0.4 % (ref 0.0–3.0)
Bilirubin, Direct: 0.1 mg/dL (ref 0.0–0.3)
CO2: 30 meq/L (ref 19–32)
Calcium: 9.1 mg/dL (ref 8.4–10.5)
Chloride: 102 meq/L (ref 96–112)
Cholesterol: 193 mg/dL (ref 0–200)
Creatinine, Ser: 0.6 mg/dL (ref 0.4–1.2)
Eosinophils Absolute: 0.2 10*3/uL (ref 0.0–0.7)
Eosinophils Relative: 2.5 % (ref 0.0–5.0)
GFR calc non Af Amer: 102.76 mL/min (ref >60.00)
Glucose, Bld: 103 mg/dL — ABNORMAL HIGH (ref 70–99)
HCT: 41.5 % (ref 36.0–46.0)
HDL: 54.4 mg/dL (ref >39.00)
Hemoglobin: 14.1 g/dL (ref 12.0–15.0)
Hgb A1c MFr Bld: 5.8 % (ref 4.6–6.5)
LDL Cholesterol: 126 mg/dL — ABNORMAL HIGH (ref 0–99)
Lymphocytes Relative: 21.5 % (ref 12.0–46.0)
Lymphs Abs: 1.4 10*3/uL (ref 0.7–4.0)
MCHC: 34 g/dL (ref 30.0–36.0)
MCV: 90.2 fL (ref 78.0–100.0)
Monocytes Absolute: 0.5 10*3/uL (ref 0.1–1.0)
Monocytes Relative: 7 % (ref 3.0–12.0)
Neutro Abs: 4.6 10*3/uL (ref 1.4–7.7)
Neutrophils Relative %: 68.6 % (ref 43.0–77.0)
Platelets: 185 10*3/uL (ref 150.0–400.0)
Potassium: 4.1 meq/L (ref 3.5–5.1)
RBC: 4.6 M/uL (ref 3.87–5.11)
RDW: 14 % (ref 11.5–14.6)
Sodium: 140 meq/L (ref 135–145)
TSH: 0.54 microintl units/mL (ref 0.35–5.50)
Total Bilirubin: 0.7 mg/dL (ref 0.3–1.2)
Total CHOL/HDL Ratio: 4
Total Protein: 7 g/dL (ref 6.0–8.3)
Triglycerides: 64 mg/dL (ref 0.0–149.0)
VLDL: 12.8 mg/dL (ref 0.0–40.0)
WBC: 6.7 10*3/uL (ref 4.5–10.5)

## 2010-01-13 LAB — CONVERTED CEMR LAB: Vit D, 25-Hydroxy: 91 ng/mL — ABNORMAL HIGH (ref 30–89)

## 2010-01-19 ENCOUNTER — Ambulatory Visit: Payer: Self-pay | Admitting: Family Medicine

## 2010-03-09 NOTE — Progress Notes (Signed)
Summary: needs written script for zetia  Phone Note Refill Request Message from:  Patient  Refills Requested: Medication #1:  zetia Pt needs 90 day written script for zetia to send to mail order.  This is no longer on med list but you have advised her to restart it.    Initial call taken by: Lowella Petties CMA,  February 12, 2009 9:45 AM  Follow-up for Phone Call        printed in put in nurse in box for pickup  Follow-up by: Judith Part MD,  February 12, 2009 10:39 AM  Additional Follow-up for Phone Call Additional follow up Details #1::        Mailed to pt's home per her request. Additional Follow-up by: Lowella Petties CMA,  February 12, 2009 10:50 AM    New/Updated Medications: ZETIA 10 MG TABS (EZETIMIBE) 1 by mouth once daily Prescriptions: ZETIA 10 MG TABS (EZETIMIBE) 1 by mouth once daily  #90 x 3   Entered and Authorized by:   Judith Part MD   Signed by:   Judith Part MD on 02/12/2009   Method used:   Print then Give to Patient   RxID:   [id number]

## 2010-03-09 NOTE — Miscellaneous (Signed)
Summary: BONE DENSITY  Clinical Lists Changes  Orders: Added new Test order of T-Bone Densitometry (77080) - Signed Added new Test order of T-Lumbar Vertebral Assessment (77082) - Signed 

## 2010-03-09 NOTE — Letter (Signed)
Summary: [facility]   Imported By: Maryln Gottron 10/28/2008 15:15:01  _____________________________________________________________________  External Attachment:    Type:   Image     Comment:   External Document

## 2010-03-09 NOTE — Letter (Signed)
Summary: [facility]  [facility]   Imported By: Lanelle Bal 08/18/2008 10:57:18  _____________________________________________________________________  External Attachment:    Type:   Image     Comment:   External Document

## 2010-03-09 NOTE — Progress Notes (Signed)
  Phone Note Call from Patient   Caller: Patient Summary of Call: Patient is calling to request that you please order for her a Bilateral Lower Extremity Venous Doppler her Orthopedic Surgeon wants her to have this test done.  She had a total hip replacement at [facility] and would like to be able to have this test done locally. She is supposed to have this done 7-9 days after her surgery. She had this done 09/11/2008. The order says this test is necessay and says to use lower leg pain and swelling and THR. please call patient at [phone].  She needs to have this done by Friday.  Initial call taken by: Carlton Adam,  September 15, 2008 10:13 AM  Follow-up for Phone Call        this should be ordered by doctor who wants it -- since result is going to him-- and I am not treating for the problem  please call her surgeon's office to see what the deal is - thank you Follow-up by: Judith Part MD,  September 15, 2008 10:28 AM  Additional Follow-up for Phone Call Additional follow up Details #1::        Called the patient back. She will call [facility] to have the doppler done.  Additional Follow-up by: Carlton Adam,  September 15, 2008 11:56 AM

## 2010-03-09 NOTE — Miscellaneous (Signed)
Summary: [facility] Previsit/prep  Clinical Lists Changes  Medications: Added new medication of MOVIPREP 100 GM  SOLR (PEG-KCL-NACL-NASULF-NA ASC-C) As per prep instructions. - Signed Rx of MOVIPREP 100 GM  SOLR (PEG-KCL-NACL-NASULF-NA ASC-C) As per prep instructions.;  #1 x 0;  Signed;  Entered by: Wyona Almas RN;  Authorized by: Louis Meckel MD;  Method used: Electronically to [facility], [place], [place], [place]  [address], Ph: [phone], Fax: [phone] Observations: Added new observation of ALLERGY REV: Done (06/05/2009 14:24)    Prescriptions: MOVIPREP 100 GM  SOLR (PEG-KCL-NACL-NASULF-NA ASC-C) As per prep instructions.  #1 x 0   Entered by:   Wyona Almas RN   Authorized by:   Louis Meckel MD   Signed by:   Wyona Almas RN on 06/05/2009   Method used:   Electronically to        [facility]       [address]       [place], [place]  [address]       Ph: [phone]       Fax: [phone]   RxID:   [id number]

## 2010-03-09 NOTE — Assessment & Plan Note (Signed)
Summary: NEW PT TO EST/CLE   Vital Signs:  Patient profile:   65 year old female Height:      68.25 inches Weight:      226 pounds BMI:     34.24 Temp:     97.4 degrees F oral Pulse rate:   64 / minute Pulse rhythm:   regular BP sitting:   140 / 86  (left arm) Cuff size:   regular  Vitals Entered By: Liane Comber (April 22, 2008 11:19 AM)  History of Present Illness: used to see Dr Sonia Baller  lot of health problems - but overal healthy   lost from 268 to 226 - lb since last phys visit  started program called [facility] - using ADA diet and stationary bike  is biking about 5 times per week (25 minutes avg) last eye exam last week - noDM in eyes - Dr Dione Booze  was feeling some tingling in feet   screening - last mam 2001( had mastectomy) last pap 9/09  labs 9.09 AIC 6.2- just found out -- excited to check  chol LDL 99, HDL 50, trig 94-- on zetia   vt D level 46 in 08- after weekly tx  takes 2 (? international units) tabs per day  taking ca with magnesium  last dexa was 2006 - nl dexa (? osteopenia on x r)  just started back on glucosamine-? 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    Allergies (verified): 1)  ! * Statins  Past History:  Past Medical History:    GERD    Hyperlipidemia- statin intolerant     osteoarthritis  with Sjogren's syndrome    breast ca DCIS, BRCA 1 pos HER2 pos, ER2 pos (bilat mastectomy and oophrectomy)    urine incontinence   deg disk disease     depression (situational, very well controlled , and now takes prozac for sleep)     hx of vit D deficiency    AIC elevated to 6.2 (9/09) hyperglycemia     osteopenia 07--? from x ray with nl dexa         oncology Dr Park Breed  Past Surgical History:    Tonsillectomy (1974)    Ruptured Disc 5th lumbar (1991)    Mastectomy (bilat) 2001    ovaries removed 2005    bilat knee replacement 2006    dexa nl in 2006   Family History:    Family History of Arthritis    Family History Breast cancer 1st degree relative <50    Family History Diabetes 1st degree relative (other relative)    Family History High cholesterol    Family History Hypertension    Family History Ovarian cancer    Family History of Prostate CA 1st degree relative <50    Family History of Stroke  psoriasis - brother     daugter with breast ca gene  Social History:    Retired Runner, broadcasting/film/video with master's degree    moved from [place] 3 y ago     Divorced    G3P3    Never Smoked    Alcohol use-no    Drug use-no    Regular exercise-yes    Smoking Status:  never    Drug Use:  no    Does Patient Exercise:  yes  Review of Systems General:  Denies fatigue, fever, loss of appetite, and malaise. Eyes:  Denies blurring and eye pain. ENT:  Denies sinus pressure and sore throat. CV:  Denies chest pain or discomfort, lightheadness, palpitations, shortness of breath with exertion, and swelling of feet. Resp:  Denies cough and wheezing. GI:  Denies abdominal pain, change in bowel habits, and nausea. GU:  Denies dysuria, urinary frequency, and urinary hesitancy. MS:  Complains of joint pain and stiffness; denies joint redness and joint swelling. Derm:  Complains of dryness, itching, and rash; denies lesion(s). Neuro:  Denies numbness, tingling, and weakness. Psych:  Denies anxiety and depression. Endo:  Denies cold intolerance, excessive thirst, excessive urination, and heat intolerance. Heme:  Denies abnormal  bruising.  Physical Exam  General:  overweight but generally well appearing  Head:  normocephalic, atraumatic, and no abnormalities observed.   Eyes:  vision grossly intact, pupils equal, pupils round, and pupils reactive to light.  no conjunctival pallor, injection or icterus  Ears:  R ear normal and L ear normal.   Nose:  no nasal discharge.  - nares are boggy Mouth:  pharynx pink and moist, no erythema, and no exudates.   Neck:  supple with full rom and no masses or thyromegally, no JVD or carotid bruit  Chest Wall:  No deformities, masses, or tenderness noted. Breasts:  s/p mastectomy Lungs:  Normal respiratory effort, chest expands symmetrically. Lungs are clear to auscultation, no crackles or wheezes. Heart:  Normal rate and regular rhythm. S1 and S2 normal without gallop, murmur, click, rub or other extra sounds. Abdomen:  Bowel sounds positive,abdomen soft and non-tender without masses, organomegaly or hernias noted. no renal bruits  Msk:  No deformity or scoliosis noted of thoracic or lumbar spine.  no acute joint changes Pulses:  R and L carotid,radial,femoral,dorsalis pedis and posterior tibial pulses are full and equal bilaterally Extremities:  No clubbing, cyanosis, edema, or deformity noted with normal full range of motion of all joints.   Neurologic:  sensation intact to light touch, gait normal, and DTRs symmetrical and normal.   Skin:  dry skin on arms - some papules redness over 3-4 cm area of each antecubital fossa without vesicle or skin breakdown no excoriations Cervical Nodes:  No lymphadenopathy noted Inguinal Nodes:  No significant adenopathy Psych:  normal affect, talkative and pleasant    Impression & Recommendations:  Problem # 1:  UNSPECIFIED VITAMIN D DEFICIENCY (ICD-268.9) Assessment New was on weekly tx check level and adv  will plan dexa at next health mt exam Orders: Venipuncture (04540) T-Vitamin D (25-Hydroxy) (98119-14782) Specimen Handling  (99000)  Problem # 2:  HYPERGLYCEMIA (ICD-790.29) Assessment: New suspect AIC will be lower this check with wt loss and good habits  disc healthy diet (low simple sugar/ choose complex carbs/ low sat fat) diet and exercise in detail  Orders: Venipuncture (95621) TLB-Renal Function Panel (80069-RENAL) TLB-A1C / Hgb A1C (Glycohemoglobin) (83036-A1C)  Problem # 3:  HYPERLIPIDEMIA (ICD-272.4) Assessment: New lipids today has been  well controlled on zetia and diet  Her updated medication list for this problem includes:    Zetia 10 Mg Tabs (Ezetimibe) .[facility] Kitchen... Take 1 tablet by mouth once a day  Orders: Venipuncture (11914) TLB-Lipid Panel (80061-LIPID)  Problem # 4:  CONTACT DERMATITIS-NOS (ICD-692.9) Assessment: New in flexor surf arms - will try amylactin for dry skin and cort aid on red areas update if worse or no imp  will be on look out for any auto immune skin changes  Complete Medication List: 1)  Pilocarpine Hcl 5 Mg Tabs (Pilocarpine hcl) .... Three tabs by mouth daily 2)  Celebrex 200 Mg Caps (Celecoxib) .... Take 1 tablet by mouth once a day 3)  Oxytrol 3.9 Mg/24hr Pttw (Oxybutynin) .... One patch twice weekly as directed 4)  Prozac 20 Mg Caps (Fluoxetine hcl) .... Take 1 tablet by mouth once a day 5)  Restasis 0.05 % Emul (Cyclosporine) .... As directed 6)  Zetia 10 Mg Tabs (Ezetimibe) .... Take 1 tablet by mouth once a day 7)  Folbic 2.5-25-2 Mg Tabs (Fa-pyridoxine-cyancobalamin) .... Take 1 tablet by mouth once a day 8)  Prilosec 20 Mg Cpdr (Omeprazole) .... Take 1 tablet by mouth once a day 9)  Cal/mag Citrate 250-125 Mg Tabs (Calcium-magnesium) .... Take 1 tablet by mouth once a day 10)  Fish Oil Oil (Fish oil) .... Take 1 tablet by mouth once a day 11)  Flax Oil (Flaxseed (linseed)) .... Take 1 tablet by mouth once a day 12)  Vitamin C 1000 Mg Tabs (ascorbic Acid)  .... Take 1 tablet by mouth two times a day 13)  Glucosamine-chondroitin Caps (Glucosamine-chondroit-vit  c-mn) .... Take 1 tablet by mouth once a day  Patient Instructions: 1)  try amylactin for dry skin  2)  avoid hot water 3)  use some over the counter cort aid - for spots on inner arms- and update me if they do not improve  4)  labs today - sugar and lipid and vit D level to advise you further  5)  schedule PE in fall  6)  keep up the great job with diet and exercise  7)  The medication list was reviewed and reconciled.  All changed / newly prescribed medications were explained.  A complete medication list was provided to the patient / caregiver. Prescriptions: OXYTROL 3.9 MG/24HR PTTW (OXYBUTYNIN) one patch twice weekly as directed  #3 months x 3   Entered and Authorized by:   Judith Part MD   Signed by:   Judith Part MD on 04/22/2008   Method used:   Print then Give to Patient   RxID:   [id number]    Preventive Care Screening  Pap Smear:    Date:  10/17/2007    Results:  normal   Last Tetanus Booster:    Date:  10/13/2006    Results:  Tdap

## 2010-03-09 NOTE — Procedures (Signed)
Summary: Colonoscopy  Patient: [name] Note: All result statuses are Final unless otherwise noted.  Tests: (1) Colonoscopy (COL)   COL Colonoscopy           DONE     [facility]     [address]     [place], [place]  [address]           COLONOSCOPY PROCEDURE REPORT           PATIENT:  Lauren [name], Lauren [name]  MR#:  [id number]     BIRTHDATE:  1946-01-11, 64 yrs. old  GENDER:  female           ENDOSCOPIST:  Barbette Hair. Arlyce Dice, MD     Referred by:  Marne A. Milinda Antis, M.D.           PROCEDURE DATE:  06/11/2009     PROCEDURE:  Colon with cold biopsy polypectomy     ASA CLASS:  Class II     INDICATIONS:  1) Routine Risk Screening           MEDICATIONS:  None           DESCRIPTION OF PROCEDURE:   After the risks benefits and     alternatives of the procedure were thoroughly explained, informed     consent was obtained.  Digital rectal exam was performed and     revealed no abnormalities.   The LB [id number] endoscope     was introduced through the anus and advanced to the cecum, which     was identified by the ileocecal valve, without limitations.  The     quality of the prep was excellent, using MoviPrep.  The instrument     was then slowly withdrawn as the colon was fully examined.     <<PROCEDUREIMAGES>>           FINDINGS:  There were multiple polyps identified and removed. in     the ascending colon (see image4). 2 1-32mm diminutive polyps -     removed with cold bx forceps  Moderate diverticulosis was found in     the sigmoid to descending colon segments (see image3, image12, and     image13). Moderately severe diverticular changes  This was     otherwise a normal examination of the colon (see image6, image8,     image7, image10, image11, image16, and image17).   Retroflexed     views in the rectum revealed no abnormalities.    The time to     cecum =  time to Cecum 5.0  minutes. The scope was then withdrawn     (time =  13.0  min) from the patient and the  procedure completed.           COMPLICATIONS:  None           ENDOSCOPIC IMPRESSION:     1) Polyps, multiple in the ascending colon     2) Moderate diverticulosis in the sigmoid to descending colon     segments     3) Otherwise normal examination     RECOMMENDATIONS:     1) If the polyp(s) removed today are proven to be adenomatous     (pre-cancerous) polyps, you will need a repeat colonoscopy in 5     years. Otherwise you should continue to follow colorectal cancer     screening guidelines for "routine risk" patients with colonoscopy     in 10 years.  REPEAT EXAM:   You will receive a letter from Dr. Arlyce Dice in 1-2     weeks, after reviewing the final pathology, with followup     recommendations.           ______________________________     Barbette Hair Arlyce Dice, MD           CC:           n.     eSIGNED:   Barbette Hair. Theodus Ran at 06/11/2009 09:17 AM           Page 2 of 3   [name], [name], [id number]  Note: An exclamation mark (!) indicates a result that was not dispersed into the flowsheet. Document Creation Date: 06/11/2009 9:18 AM _______________________________________________________________________  (1) Order result status: Final Collection or observation date-time: 06/11/2009 09:05 Requested date-time:  Receipt date-time:  Reported date-time:  Referring Physician:   Ordering Physician: Melvia Heaps [phone]) Specimen Source:  Source: Launa Grill Order Number: [id number] Lab site:   Appended Document: Colonoscopy f/u colonoscopy in 5 years  Appended Document: Colonoscopy     Procedures Next Due Date:    Colonoscopy: 06/2014

## 2010-03-09 NOTE — Letter (Signed)
Summary: Dr.Annmarie Mazzocchi Records  Dr.Annmarie Mazzocchi Records   Imported By: Beau Fanny 04/22/2008 14:26:42  _____________________________________________________________________  External Attachment:    Type:   Image     Comment:   External Document

## 2010-03-09 NOTE — Letter (Signed)
Summary: Patient Notice- Polyp Results  [facility]  [address] [place], [place] [address]   Phone: [phone]  Fax: [phone]        Jun 16, 2009 MRN: [id number]    [facility] [address] [place], [place]  [address]    Dear Ms. Hallberg,  I am pleased to inform you that the colon polyp(s) removed during your recent colonoscopy was (were) found to be benign (no cancer detected) upon pathologic examination.  I recommend you have a repeat colonoscopy examination in 5_ years to look for recurrent polyps, as having colon polyps increases your risk for having recurrent polyps or even colon cancer in the future.  Should you develop new or worsening symptoms of abdominal pain, bowel habit changes or bleeding from the rectum or bowels, please schedule an evaluation with either your primary care physician or with me.  Additional information/recommendations:  __ No further action with gastroenterology is needed at this time. Please      follow-up with your primary care physician for your other healthcare      needs.  __ Please call [phone] to schedule a return visit to review your      situation.  __ Please keep your follow-up visit as already scheduled.  _x_ Continue treatment plan as outlined the day of your exam.  Please call us if you are having persistent problems or have questions about your condition that have not been fully answered at this time.  Sincerely,  Louis Meckel MD  This letter has been electronically signed by your physician.  Appended Document: Patient Notice- Polyp Results letter mailed

## 2010-03-09 NOTE — Letter (Signed)
Summary: Patient Questionnaire  Patient Questionnaire   Imported By: Beau Fanny 04/22/2008 14:37:25  _____________________________________________________________________  External Attachment:    Type:   Image     Comment:   External Document

## 2010-03-09 NOTE — Letter (Signed)
Summary: [facility]   Imported By: Lanelle Bal 09/16/2009 12:56:42  _____________________________________________________________________  External Attachment:    Type:   Image     Comment:   External Document

## 2010-03-09 NOTE — Progress Notes (Signed)
----   Converted from flag ---- ---- 01/07/2010 9:53 PM, Colon Flattery Tower MD wrote: please check vit D and wellness and lipid for v70.0 and 272 and vit D def thanks  ---- 01/06/2010 10:55 AM, Liane Comber CMA (AAMA) wrote: Lab orders please! Good Morning! This pt is scheduled for cpx labs Tuesday, which labs to draw and dx codes to use? Thanks Tasha ------------------------------

## 2010-03-09 NOTE — Progress Notes (Signed)
Summary: Last Gi Md Information  Phone Note From Other Clinic   Caller: Referral Coordinator Summary of Call: Register Called and states that the patients last GI MD was about 3 years ago. Patient has a direct scheduled for 06/18/2009.  Dr Jonny Ruiz Ward [address] [place] [address] Phone Number: [phone] Initial call taken by: Harlow Mares CMA Duncan Dull),  June 04, 2009 2:27 PM  Follow-up for Phone Call        Contacted Dr Barney Drain office, for fax number to fax over signed medical release from pt for previous GI records. Pt has previsit scheduled for 06/10/2009 in room 51.Medical records fax# [phone], phone number: [phone] Follow-up by: Merri Ray CMA Duncan Dull),  June 04, 2009 2:44 PM     Appended Document: Last Gi Md Information Gave Previsit room 51 release to be signed

## 2010-03-09 NOTE — Assessment & Plan Note (Signed)
Summary: COUGH,CONGESTION/CLE   Vital Signs:  Patient profile:   65 year old female Weight:      209 pounds Temp:     97.6 degrees F oral Pulse rate:   60 / minute Pulse rhythm:   regular BP sitting:   150 / 92  (left arm) Cuff size:   regular  Vitals Entered By: Lowella Petties CMA (February 19, 2009 10:51 AM) CC: Cough, chest congestion   History of Present Illness: started with sinus symptoms after end of nov  then caught a cold last week -- much worse  now is having more cough- now productive and rattling -- yellow  no fever - some chills with virus   is using zinc cough drops - does not help much  alka selzer plus - helped initially  mucinex now   both stuffy and runny nose - uses sinus rinse - yellow d/c  ears and throat ok  is having facial pain behind eyes   Allergies: 1)  ! * Statins 2)  ! Augmentin  Past History:  Past Medical History: Last updated: 04/22/2008 GERD Hyperlipidemia- statin intolerant  osteoarthritis  with Sjogren's syndrome breast ca DCIS, BRCA 1 pos HER2 pos, ER2 pos (bilat mastectomy and oophrectomy) urine incontinence deg disk disease  depression (situational, very well controlled , and now takes prozac for sleep)  hx of vit D deficiency AIC elevated to 6.2 (9/09) hyperglycemia  osteopenia 07--? from x ray with nl dexa   oncology Dr Park Breed  Past Surgical History: Last updated: 12/12/2008 Tonsillectomy (1974) Ruptured Disc 5th lumbar (1991) Mastectomy (bilat) 2001 ovaries removed 2005 bilat knee replacement 2006 dexa nl in 2006  colonoscopy 5/07-- normal rec re check 3 y   Family History: Last updated: 12/12/2008 Family History of Arthritis Family History Breast cancer 1st degree relative <50 Family History Diabetes 1st degree relative (other relative) Family History High cholesterol Family History Hypertension Family History Ovarian cancer Family History of Prostate CA 1st degree relative <50 Family History of  Stroke psoriasis - brother  daugter with breast ca gene brother brain ca brother brca gene and colon cancer  mother with dementia   Social History: Last updated: 04/22/2008 Retired Runner, 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 Eyes:  Complains of eye pain; denies discharge. ENT:  Complains of nasal congestion, postnasal drainage, and sinus pressure. CV:  Denies chest pain or discomfort and palpitations. Resp:  Complains of cough and sputum productive; denies shortness of breath and wheezing. GI:  Denies abdominal pain, diarrhea, nausea, and vomiting. Derm:  Denies itching and rash.  Physical Exam  General:  overweight but generally well appearing  Head:  normocephalic, atraumatic, and no abnormalities observed.  maxillary and ethmoid sinus tendenress bilat  Eyes:  vision grossly intact, pupils equal, pupils round, pupils reactive to light, and no injection.   Ears:  R ear normal and L ear normal.   Nose:  nares are injected and congested bilat  Mouth:  pharynx pink and moist, no erythema, and no exudates.   Neck:  No deformities, masses, or tenderness noted. Lungs:  CTA with harsh bs at bases  no rales or rhonchi or wheeze Skin:  Intact without suspicious lesions or rashes Cervical Nodes:  No lymphadenopathy noted  Psych:  normal affect, talkative and pleasant    Impression & Recommendations:  Problem # 1:  SINUSITIS - ACUTE-NOS (ICD-461.9) Assessment New s/p 2 uri - now with persistant facial pain and cough  will tx with levaquin and update  recommend sympt care- see pt instructions   pt advised to update me if symptoms worsen or do not improve - esp facial pain or cough Her updated  medication list for this problem includes:    Levaquin 500 Mg Tabs (Levofloxacin) .Marland Kitchen... 1 by mouth once daily for 10 days    Guaifenesin-codeine 100-10 Mg/50ml Liqd (Guaifenesin-codeine) .Marland Kitchen... 1-2 teaspoons up to every 4 hours as needed cough  Complete Medication List: 1)  Pilocarpine Hcl 5 Mg Tabs (Pilocarpine hcl) .... Three tabs by mouth daily 2)  Oxytrol 3.9 Mg/24hr Pttw (Oxybutynin) .... One patch twice weekly as directed 3)  Prozac 20 Mg Caps (Fluoxetine hcl) .... Take 1 tablet by mouth once a day 4)  Restasis 0.05 % Emul (Cyclosporine) .... As directed 5)  Prilosec 20 Mg Cpdr (Omeprazole) .... Take 1 tablet by mouth once a day 6)  Cal/mag Citrate 250-125 Mg Tabs (Calcium-magnesium) .... Take 1 tablet by mouth once a day 7)  Fish Oil Oil (Fish oil) .... Take 1 tablet by mouth twice a day 8)  Flax Oil (Flaxseed (linseed)) .... Take 1 tablet by mouth 2 times a day 9)  Vitamin C 1000 Mg Tabs (ascorbic Acid)  .... Take 1 tablet by mouth two times a day 10)  Bilateral Breast Prosthesis  .... For use as directed 11)  Zetia 10 Mg Tabs (Ezetimibe) .Marland Kitchen.. 1 by mouth once daily 12)  Levaquin 500 Mg Tabs (Levofloxacin) .Marland Kitchen.. 1 by mouth once daily for 10 days 13)  Guaifenesin-codeine 100-10 Mg/12ml Liqd (Guaifenesin-codeine) .Marland Kitchen.. 1-2 teaspoons up to every 4 hours as needed cough  Patient Instructions: 1)  you can try mucinex over the counter twice daily as directed and nasal saline spray for congestion 2)  tylenol over the counter as directed may help with aches, headache and fever 3)  call if symptoms worsen or if not improved in 4-5 days  4)  take the levaquin as directed for sinus infection  Prescriptions: GUAIFENESIN-CODEINE 100-10 MG/5ML LIQD (GUAIFENESIN-CODEINE) 1-2 teaspoons up to every 4 hours as needed cough  #120cc x 0   Entered and Authorized by:   Judith Part MD   Signed by:   Judith Part MD on 02/19/2009   Method used:   Print then Give to Patient   RxID:    [id number] LEVAQUIN 500 MG TABS (LEVOFLOXACIN) 1 by mouth once daily for 10 days  #10 x 0   Entered and Authorized by:   Judith Part MD   Signed by:   Judith Part MD on 02/19/2009   Method used:   Print then Give to Patient   RxID:   [id number]   Prior Medications (reviewed today): PILOCARPINE HCL 5 MG TABS (PILOCARPINE HCL) three tabs by mouth daily OXYTROL 3.9 MG/24HR PTTW (OXYBUTYNIN) one patch twice weekly as directed PROZAC 20 MG CAPS (FLUOXETINE HCL) Take 1 tablet by mouth once a day RESTASIS 0.05 % EMUL (CYCLOSPORINE) as directed PRILOSEC 20 MG CPDR (OMEPRAZOLE) Take 1 tablet by mouth once a day CAL/MAG CITRATE 250-125 MG TABS (CALCIUM-MAGNESIUM) Take 1 tablet by mouth once a day FISH OIL   OIL (FISH OIL) Take 1 tablet by mouth twice a day FLAX   OIL (FLAXSEED (LINSEED)) Take 1 tablet  by mouth 2 times a day VITAMIN C 1000 MG  TABS (ASCORBIC ACID) () Take 1 tablet by mouth two times a day BILATERAL BREAST PROSTHESIS () for use as directed ZETIA 10 MG TABS (EZETIMIBE) 1 by mouth once daily LEVAQUIN 500 MG TABS (LEVOFLOXACIN) 1 by mouth once daily for 10 days GUAIFENESIN-CODEINE 100-10 MG/5ML LIQD (GUAIFENESIN-CODEINE) 1-2 teaspoons up to every 4 hours as needed cough Current Allergies (reviewed today): ! * STATINS ! AUGMENTIN

## 2010-03-09 NOTE — Progress Notes (Signed)
----   Converted from flag ---- ---- 01/07/2010 9:56 PM, Judith Part MD wrote: I think I forgot to add for labs for Lauren [name] for hyperglycemia -- thanks ------------------------------

## 2010-03-09 NOTE — Miscellaneous (Signed)
Summary: px for pilocarpine  Clinical Lists Changes  Medications: Rx of PILOCARPINE HCL 5 MG TABS (PILOCARPINE HCL) three tabs by mouth daily;  #3 months x 3;  Signed;  Entered by: Judith Part MD;  Authorized by: Colon Flattery Kaliana Albino MD;  Method used: Printed then faxed to    Prescriptions: PILOCARPINE HCL 5 MG TABS (PILOCARPINE HCL) three tabs by mouth daily  #3 months x 3   Entered and Authorized by:   Judith Part MD   Signed by:   Judith Part MD on 04/30/2008   Method used:   Printed then faxed to ...         RxID:   [id number]

## 2010-03-09 NOTE — Letter (Signed)
Summary: [facility]  [facility]   Imported By: Lanelle Bal 10/03/2008 12:07:36  _____________________________________________________________________  External Attachment:    Type:   Image     Comment:   External Document

## 2010-03-09 NOTE — Letter (Signed)
Summary: Patient Notice- Polyp Results  [facility]  [address]   [place], [place] [address]   Phone: [phone]  Fax: [phone]        Jun 16, 2009 MRN: [id number]    Lauren [name] [address] [place], [place]  [address]    Dear [name],  I am pleased to inform you that the colon polyp(s) removed during your recent colonoscopy was (were) found to be benign (no cancer detected) upon pathologic examination.  I recommend you have a repeat colonoscopy examination in 5_ years to look for recurrent polyps, as having colon polyps increases your risk for having recurrent polyps or even colon cancer in the future.  Should you develop new or worsening symptoms of abdominal pain, bowel habit changes or bleeding from the rectum or bowels, please schedule an evaluation with either your primary care physician or with me.  Additional information/recommendations:  __ No further action with gastroenterology is needed at this time. Please      follow-up with your primary care physician for your other healthcare      needs.  __ Please call [phone] to schedule a return visit to review your      situation.  __ Please keep your follow-up visit as already scheduled.  _x_ Continue treatment plan as outlined the day of your exam.  Please call us if you are having persistent problems or have questions about your condition that have not been fully answered at this time.  Sincerely,  Jamere Stidham D Jazion Atteberry MD  This letter has been electronically signed by your physician.  Appended Document: Patient Notice- Polyp Results letter mailed 

## 2010-03-09 NOTE — Assessment & Plan Note (Signed)
Summary: cpx/pap/mk   Vital Signs:  Patient profile:   65 year old female Height:      67.75 inches Weight:      206 pounds BMI:     31.67 Temp:     97.7 degrees F oral Pulse rate:   60 / minute BP sitting:   140 / 80  (right arm) Cuff size:   large  Vitals Entered By: Lowella Petties CMA (December 12, 2008 2:54 PM) CC: 30 minute check up   History of Present Illness: is here for health mt exam   wt is down 11 lb from last visit is very pleased with this  no pain at all   had hip repl at [facility] has already taken 3 trips - thrilled with that    bp 140/80 today-- has a little white cuff sydrome is 112/70 at home usually   hx of breast ca and mastect as well as oophrect for [facility] gene has not been back for yearly f/u yet- this will be her last one  last pap was 9/09-- does not need one this year    osteopenia by hx ca and D dexa - ? 06- needs to schedule it  is currently taking 1000 international units two times a day   Td 08 flu shot - wants today  had shingles vaccine  last colonosc in 5/07 -- all nl , and rec 3 y f/u due to [place] gene   needs labs  last lipids fair with LDL 107 with diet control and zetia  wants to stop it now  is still loosing wt and plans on 20-30 more pounds  diet is totally different now also wants to stop folyx   did stop her celebrex-- getting used to being without it   Allergies: 1)  ! * Statins  Past History:  Past Medical History: Last updated: 04/22/2008 GERD Hyperlipidemia- statin intolerant  osteoarthritis  with Sjogren's syndrome breast ca DCIS, BRCA 1 pos HER2 pos, ER2 pos (bilat mastectomy and oophrectomy) urine incontinence deg disk disease  depression (situational, very well controlled , and now takes prozac for sleep)  hx of vit D deficiency AIC elevated to 6.2 (9/09) hyperglycemia  osteopenia 07--? 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  Divorced G3P3 Never Smoked Alcohol use-no Drug use-no Regular exercise-yes  Risk Factors: Exercise: yes (04/22/2008)  Risk Factors: Smoking Status: never (04/22/2008)  Past Surgical History: Tonsillectomy (1974) Ruptured Disc 5th lumbar (1991) Mastectomy (bilat) 2001 ovaries removed 2005 bilat knee replacement 2006 dexa nl in 2006  colonoscopy 5/07-- normal rec re check 3 y   Family History: Family History of Arthritis Family History Breast cancer 1st degree relative <50 Family History Diabetes 1st degree relative (other relative) Family History High cholesterol Family History Hypertension Family History Ovarian cancer Family History of Prostate CA 1st degree relative <50 Family History of Stroke psoriasis - brother  daugter with breast ca gene brother brain  ca brother brca gene and colon cancer  mother with dementia   Review of Systems General:  Denies fatigue, fever, loss of appetite, and malaise. Eyes:  Denies blurring and eye irritation. CV:  Denies chest pain or discomfort, palpitations, shortness of breath with exertion, and swelling of feet. Resp:  Denies cough and wheezing. GI:  Denies abdominal pain, bloody stools, change in bowel habits, indigestion, and nausea. GU:  Denies abnormal vaginal bleeding, discharge, dysuria, hematuria, and urinary frequency. MS:  Complains of stiffness; denies joint pain, cramps, and muscle  weakness. Derm:  Denies itching, lesion(s), poor wound healing, and rash. Neuro:  Denies numbness and tingling. Psych:  mood is very good . Endo:  Denies excessive thirst and excessive urination. Heme:  Denies abnormal bruising and bleeding.  Physical Exam  General:  overweight but generally well appearing  Head:  Normocephalic and atraumatic without obvious abnormalities. No apparent alopecia or balding. Eyes:  vision grossly intact, pupils equal, pupils round, and pupils reactive to light.  no conjunctival pallor, injection or icterus  Ears:  R ear normal and L ear normal.   Nose:  no nasal discharge.   Mouth:  pharynx pink and moist.   Neck:  supple with full rom and no masses or thyromegally, no JVD or carotid bruit  Chest Wall:  No deformities, masses, or tenderness noted. bilat mastectomy noted  Lungs:  Normal respiratory effort, chest expands symmetrically. Lungs are clear to auscultation, no crackles or wheezes. Heart:  Normal rate and regular rhythm. S1 and S2 normal without gallop, murmur, click, rub or other extra sounds. Abdomen:  Bowel sounds positive,abdomen soft and non-tender without masses, organomegaly or hernias noted. no renal bruits  Msk:  No deformity or scoliosis noted of thoracic or lumbar spine.  no acute joint changes hip rom is much improved  Pulses:  R and L carotid,radial,femoral,dorsalis pedis and posterior tibial pulses are full and equal bilaterally Extremities:  No clubbing, cyanosis, edema, or deformity noted with normal full range of motion of all joints.   Neurologic:  strength normal in all extremities, sensation intact to light touch, gait normal, and DTRs symmetrical and normal.   Skin:  Intact without suspicious lesions or rashes uninfected seb cyst mid chest under scar-- is mobile and nontender/ no drainage Cervical Nodes:  No lymphadenopathy noted Axillary Nodes:  No palpable lymphadenopathy Inguinal Nodes:  No significant adenopathy Psych:   normal affect, talkative and pleasant    Impression & Recommendations:  Problem # 1:  HEALTH MAINTENANCE EXAM (ICD-V70.0) Assessment Comment Only reviewed health habits including diet, exercise and skin cancer prevention reviewed health maintenance list and family history flu shot today labs 2 mo   Problem # 2:  SEBACEOUS CYST (ICD-706.2) Assessment: New under scar mid chest - bothersome ref to derm for eval Orders: Dermatology Referral (Derma)  Problem # 3:  SPECIAL SCREENING FOR OSTEOPOROSIS (ICD-V82.81) Assessment: Unchanged pt has risk of post men status with oophrectomy and prior breast ca tx  disc rec for ca and D check D level with fasting labs  sched dexa  Orders: Radiology Referral (Radiology)  Problem # 4:  OSTEOARTHRITIS, HIP (ICD-715.95) Assessment: Improved doing great off celebrex s/p hip repl The following medications were removed from the medication list:    Celebrex 200 Mg Caps (Celecoxib) .Marland Kitchen... Take 1 tablet by mouth once a day    Vicodin 5-500 Mg Tabs (Hydrocodone-acetaminophen) .Marland Kitchen... 1 by mouth up to every 6 hours as needed severe pain  Problem # 5:  HYPERGLYCEMIA (ICD-790.29) Assessment: Improved better diet and exercise and wt loss- exp imp check glucose and AIC at 2 mo labs   Problem # 6:  HYPERLIPIDEMIA (ICD-272.4) Assessment: Improved  in light of much better diet- pt wants to hold zetia for 2 mo and re check rev low sat fat diet  plan lab in 2 mo  The following medications were removed from the medication list:    Zetia 10 Mg Tabs (Ezetimibe) .Marland Kitchen... Take 1 tablet by mouth once a day    HDL:44.50 (04/22/2008)  LDL:107 (04/22/2008)  Chol:171 (04/22/2008)  Trig:97.0 (04/22/2008)  Problem # 7:  UNSPECIFIED VITAMIN D DEFICIENCY (ICD-268.9) Assessment: Unchanged on 2000 international units daily check D with next labs  Orders: Radiology Referral (Radiology)  Problem # 8:  SCREENING, COLON CANCER (ICD-V76.51) Assessment: Comment Only due 3  y f/u for Brac gene and prior breast ca  Orders: Gastroenterology Referral (GI)  Complete Medication List: 1)  Pilocarpine Hcl 5 Mg Tabs (Pilocarpine hcl) .... Three tabs by mouth daily 2)  Oxytrol 3.9 Mg/24hr Pttw (Oxybutynin) .... One patch twice weekly as directed 3)  Prozac 20 Mg Caps (Fluoxetine hcl) .... Take 1 tablet by mouth once a day 4)  Restasis 0.05 % Emul (Cyclosporine) .... As directed 5)  Prilosec 20 Mg Cpdr (Omeprazole) .... Take 1 tablet by mouth once a day 6)  Cal/mag Citrate 250-125 Mg Tabs (Calcium-magnesium) .... Take 1 tablet by mouth once a day 7)  Fish Oil Oil (Fish oil) .... Take 1 tablet by mouth twice a day 8)  Flax Oil (Flaxseed (linseed)) .... Take 1 tablet by mouth 2 times a day 9)  Vitamin C 1000 Mg Tabs (ascorbic Acid)  .... Take 1 tablet by mouth two times a day 10)  Bilateral Breast Prosthesis  .... For use as directed  Patient Instructions: 1)  stop your zetia and folbic  2)  schedule fasting labs in 2 month wellness/ lipids/vit D v70.0, and 272, 733.0, and AIC 790.29 3)  we will schedule dexa at check out  4)  we will schedule derm ref at check out  5)  flu shot today  6)  we will refer for a June colonoscopy at check out  Prescriptions: PROZAC 20 MG CAPS (FLUOXETINE HCL) Take 1 tablet by mouth once a day  #90 x 3   Entered and Authorized by:   Judith Part MD   Signed by:   Judith Part MD on 12/12/2008   Method used:   Print then Give to Patient   RxID:   [id number] OXYTROL 3.9 MG/24HR PTTW (OXYBUTYNIN) one patch twice weekly as directed  #3 months x 3   Entered and Authorized by:   Judith Part MD   Signed by:   Judith Part MD on 12/12/2008   Method used:   Print then Give to Patient   RxID:   [id number] PILOCARPINE HCL 5 MG TABS (PILOCARPINE HCL) three tabs by mouth daily  #3 months x 3   Entered and Authorized by:   Judith Part MD   Signed by:   Judith Part MD on 12/12/2008   Method used:   Print then Give  to Patient   RxID:   [id number]   Prior Medications (reviewed today): PILOCARPINE HCL 5 MG TABS (PILOCARPINE HCL) three tabs by mouth daily OXYTROL 3.9 MG/24HR PTTW (OXYBUTYNIN) one patch twice weekly as directed PROZAC 20 MG CAPS (FLUOXETINE HCL) Take 1 tablet by mouth once a  day RESTASIS 0.05 % EMUL (CYCLOSPORINE) as directed PRILOSEC 20 MG CPDR (OMEPRAZOLE) Take 1 tablet by mouth once a day CAL/MAG CITRATE 250-125 MG TABS (CALCIUM-MAGNESIUM) Take 1 tablet by mouth once a day FISH OIL   OIL (FISH OIL) Take 1 tablet by mouth twice a day FLAX   OIL (FLAXSEED (LINSEED)) Take 1 tablet by mouth 2 times a day VITAMIN C 1000 MG  TABS (ASCORBIC ACID) () Take 1 tablet by mouth two times a day BILATERAL BREAST PROSTHESIS () for use as directed Current Allergies: ! * STATINS   Preventive Care Screening  Colonoscopy:    Date:  06/07/2005    Next Due:  06/2008    Results:  normal      Other Immunization History:    Zostavax # 1:  Zostavax (02/07/2005)

## 2010-03-09 NOTE — Letter (Signed)
Summary: Previsit letter  [facility]  [address] [place], [place] [address]   Phone: [phone]  Fax: [phone]       06/04/2009 MRN: [id number]  [facility] [address] [place], [place]  [address]  Dear Lauren [name],  Welcome to the Gastroenterology Division at [facility].    You are scheduled to see a nurse for your pre-procedure visit on 06-10-09 at 1:00p.m. on the 3rd floor at [facility], [address]  We ask that you try to arrive at our office 15 minutes prior to your appointment time to allow for check-in.  Your nurse visit will consist of discussing your medical and surgical history, your immediate family medical history, and your medications.    Please bring a complete list of all your medications or, if you prefer, bring the medication bottles and we will list them.  We will need to be aware of both prescribed and over the counter drugs.  We will need to know exact dosage information as well.  If you are on blood thinners (Coumadin, Plavix, Aggrenox, Ticlid, etc.) please call our office today/prior to your appointment, as we need to consult with your physician about holding your medication.   Please be prepared to read and sign documents such as consent forms, a financial agreement, and acknowledgement forms.  If necessary, and with your consent, a friend or relative is welcome to sit-in on the nurse visit with you.  Please bring your insurance card so that we may make a copy of it.  If your insurance requires a referral to see a specialist, please bring your referral form from your primary care physician.  No co-pay is required for this nurse visit.     If you cannot keep your appointment, please call [phone] to cancel or reschedule prior to your appointment date.  This allows [place] the opportunity to schedule an appointment for another patient in need of care.    Thank you for choosing [facility] for your medical  needs.  We appreciate the opportunity to care for you.  Please visit [place] at our website  to learn more about our practice.                     Sincerely.                                                                                                                   The Gastroenterology Division

## 2010-03-09 NOTE — Letter (Signed)
Summary: [place] No Show Letter  [place] [facility]  [address] [place], [place] [address]   Phone: [phone]  Fax: [phone]    04/30/2009 MRN: [id number]  [facility] [address] [place], [place]  [address]   Dear Lauren [name],   Our records indicate that you missed your scheduled appointment with _______Lab______________ on _____3/23/11_______.  Please contact this office to reschedule your appointment as soon as possible.  It is important that you keep your scheduled appointments with your physician, so we can provide you the best care possible.  Please be advised that there may be a charge for "no show" appointments.    Sincerely,   [place] at [facility]

## 2010-03-09 NOTE — Letter (Signed)
Summary: Moviprep Instructions  [facility]  [address]   [place], [place] [address]   Phone: [phone]  Fax: [phone]       Lauren [name]    07-23-1945    MRN: [id number]        Procedure Day /Date: Thursday    06-11-09     Arrival Time: 7:30 a.m.     Procedure Time: 8:30 a.m.     Location of Procedure:                    x   [facility] (4th Floor)                        PREPARATION FOR COLONOSCOPY WITH MOVIPREP   Starting 5 days prior to your procedure  06-06-09 do not eat nuts, seeds, popcorn, corn, beans, peas,  salads, or any raw vegetables.  Do not take any fiber supplements (e.g. Metamucil, Citrucel, and Benefiber).  THE DAY BEFORE YOUR PROCEDURE         DATE:  06-10-09   DAY: Wednesday  1.  Drink clear liquids the entire day-NO SOLID FOOD  2.  Do not drink anything colored red or purple.  Avoid juices with pulp.  No orange juice.  3.  Drink at least 64 oz. (8 glasses) of fluid/clear liquids during the day to prevent dehydration and help the prep work efficiently.  CLEAR LIQUIDS INCLUDE: Water Jello Ice Popsicles Tea (sugar ok, no milk/cream) Powdered fruit flavored drinks Coffee (sugar ok, no milk/cream) Gatorade Juice: apple, white grape, white cranberry  Lemonade Clear bullion, consomm, broth Carbonated beverages (any kind) Strained chicken noodle soup Hard Candy                             4.  In the morning, mix first dose of MoviPrep solution:    Empty 1 Pouch A and 1 Pouch B into the disposable container    Add lukewarm drinking water to the top line of the container. Mix to dissolve    Refrigerate (mixed solution should be used within 24 hrs)  5.  Begin drinking the prep at 5:00 p.m. The MoviPrep container is divided by 4 marks.   Every 15 minutes drink the solution down to the next mark (approximately 8 oz) until the full liter is complete.   6.  Follow completed prep with 16 oz of clear liquid of your  choice (Nothing red or purple).  Continue to drink clear liquids until bedtime.  7.  Before going to bed, mix second dose of MoviPrep solution:    Empty 1 Pouch A and 1 Pouch B into the disposable container    Add lukewarm drinking water to the top line of the container. Mix to dissolve    Refrigerate  THE DAY OF YOUR PROCEDURE      DATE: 06-11-09  DAY: Thursday  Beginning at 3:30 a.m. (5 hours before procedure):         1. Every 15 minutes, drink the solution down to the next mark (approx 8 oz) until the full liter is complete.  2. Follow completed prep with 16 oz. of clear liquid of your choice.    3. You may drink clear liquids until  6:30 a.m.  (2 HOURS BEFORE PROCEDURE).   MEDICATION INSTRUCTIONS  Unless otherwise instructed, you should take regular prescription medications with a small sip of water  as early as possible the morning of your procedure.        OTHER INSTRUCTIONS  You will need a responsible adult at least 65 years of age to accompany you and drive you home.   This person must remain in the waiting room during your procedure.  Wear loose fitting clothing that is easily removed.  Leave jewelry and other valuables at home.  However, you may wish to bring a book to read or  an iPod/MP3 player to listen to music as you wait for your procedure to start.  Remove all body piercing jewelry and leave at home.  Total time from sign-in until discharge is approximately 2-3 hours.  You should go home directly after your procedure and rest.  You can resume normal activities the  day after your procedure.  The day of your procedure you should not:   Drive   Make legal decisions   Operate machinery   Drink alcohol   Return to work  You will receive specific instructions about eating, activities and medications before you leave.    The above instructions have been reviewed and explained to me by   _______________________    I fully understand and  can verbalize these instructions _____________________________ Date _________

## 2010-03-09 NOTE — Consult Note (Signed)
Summary: [facility]  [facility]   Imported By: Lester Silverton 01/22/2009 10:55:06  _____________________________________________________________________  External Attachment:    Type:   Image     Comment:   External Document

## 2010-03-09 NOTE — Letter (Signed)
Summary: Dr.Kalsoom Khan,Heme Note  Dr.Kalsoom Khan,Heme Note   Imported By: Beau Fanny 04/22/2008 14:22:39  _____________________________________________________________________  External Attachment:    Type:   Image     Comment:   External Document

## 2010-03-09 NOTE — Assessment & Plan Note (Signed)
Summary: Lower Back Pain   Vital Signs:  Patient profile:   65 year old female Height:      68.25 inches Weight:      220 pounds Temp:     97.2 degrees F oral Pulse rate:   64 / minute Pulse rhythm:   regular BP sitting:   126 / 80  (left arm) Cuff size:   large  Vitals Entered By: Liane Comber (May 28, 2008 3:20 PM)  History of Present Illness: is having trouble with her back  pain starts in R low back -- deep ache- shoots sharp pain down her R leg - side  stops at mid shin no numbness  R leg does feel weaker than ususally - just cannot walk well with it -- especially hard with stairs  no bowel or badder change  is on celebrex  in the past prednisone pack has helped temporarily   had disk surg in past 2001 in [place] with L5 - disk proceedure without a fusion  this does not feel as bad as that  last MRI 2005 or 2006  has had x rays recent -- fall 2008 (Dr Wilford Sports)    loosing wt - intentional , 6 more pounds  went to PT last fall  got fitted for new shoes  walking makes it much worse  stationary bike is more comfortable   Allergies: 1)  ! * Statins  Past History:  Past Medical History:    GERD    Hyperlipidemia- statin intolerant     osteoarthritis  with Sjogren's syndrome    breast ca DCIS, BRCA 1 pos HER2 pos, ER2 pos (bilat mastectomy and oophrectomy)    urine incontinence    deg disk disease     depression (situational, very well controlled , and now takes prozac for sleep)     hx of vit D deficiency    AIC elevated to 6.2 (9/09) hyperglycemia     osteopenia 07--? from x ray with nl dexa     oncology Dr Park Breed     (04/22/2008)  Past Surgical History:    Tonsillectomy (1974)    Ruptured Disc 5th lumbar (1991)    Mastectomy (bilat) 2001    ovaries removed 2005    bilat knee replacement 2006    dexa nl in 2006      (04/22/2008)  Family History:    Family History of Arthritis    Family History Breast cancer 1st degree relative <50    Family  History Diabetes 1st degree relative (other relative)    Family History High cholesterol    Family History Hypertension    Family History Ovarian cancer    Family History of Prostate CA 1st degree relative <50    Family History of Stroke    psoriasis - brother     daugter with breast ca gene (04/22/2008)  Social History:    Retired Runner, broadcasting/film/video with master's degree    moved from [place] 3 y ago     Divorced    G3P3    Never Smoked    Alcohol use-no    Drug use-no    Regular exercise-yes     (04/22/2008)  Review of Systems General:  Denies chills, fatigue, fever, and loss of appetite. CV:  Denies chest pain or discomfort. Resp:  Denies cough and wheezing. GU:  Denies dysuria, incontinence, urinary frequency, and urinary hesitancy. Derm:  Denies itching, lesion(s), poor wound healing, and rash. Neuro:  Denies numbness, tingling, and weakness.  Psych:  mood is ok . Endo:  Denies polyuria.  Physical Exam  General:  overweight but generally well appearing  Head:  normocephalic, atraumatic, and no abnormalities observed.   Mouth:  pharynx pink and moist.   Neck:  supple with full rom and no masses or thyromegally, no JVD or carotid bruit  Lungs:  Normal respiratory effort, chest expands symmetrically. Lungs are clear to auscultation, no crackles or wheezes. Heart:  Normal rate and regular rhythm. S1 and S2 normal without gallop, murmur, click, rub or other extra sounds. Abdomen:  soft, non-tender, normal bowel sounds, and no distention.  no suprapubic tenderness or fullness felt  Msk:  LS - with some lower bony tenderness (under scar)  nl flex, ext 10 deg with pain, nl med/lat bend  neg slr for leg pain (does yeild back pain in R) pain on int/ext rot R hip  Pulses:  R and L carotid,radial,femoral,dorsalis pedis and posterior tibial pulses are full and equal bilaterally Extremities:  No clubbing, cyanosis, edema, or deformity noted with normal full range of motion of all joints.     Neurologic:  strength normal in all extremities, sensation intact to light touch, gait normal, and DTRs symmetrical and normal.   Skin:  Intact without suspicious lesions or rashes Cervical Nodes:  No lymphadenopathy noted Inguinal Nodes:  No significant adenopathy Psych:  normal affect, talkative and pleasant    Impression & Recommendations:  Problem # 1:  BACK PAIN, LUMBAR (ICD-724.2) Assessment Deteriorated chronic with deg disk dz and surg in past  some neuol symptoms - but no deficits on exam send for ls films and R hip- update  hold celebrex and do conservative prednisone taper heat/ice/stretches pend xray- may need to f/u with MRI or ref to ortho based on result  Her updated medication list for this problem includes:    Celebrex 200 Mg Caps (Celecoxib) .Marland Kitchen... Take 1 tablet by mouth once a day  Orders: Radiology Referral (Radiology)  Complete Medication List: 1)  Pilocarpine Hcl 5 Mg Tabs (Pilocarpine hcl) .... Three tabs by mouth daily 2)  Celebrex 200 Mg Caps (Celecoxib) .... Take 1 tablet by mouth once a day 3)  Oxytrol 3.9 Mg/24hr Pttw (Oxybutynin) .... One patch twice weekly as directed 4)  Prozac 20 Mg Caps (Fluoxetine hcl) .... Take 1 tablet by mouth once a day 5)  Restasis 0.05 % Emul (Cyclosporine) .... As directed 6)  Zetia 10 Mg Tabs (Ezetimibe) .... Take 1 tablet by mouth once a day 7)  Folbic 2.5-25-2 Mg Tabs (Fa-pyridoxine-cyancobalamin) .... Take 1 tablet by mouth once a day 8)  Prilosec 20 Mg Cpdr (Omeprazole) .... Take 1 tablet by mouth once a day 9)  Cal/mag Citrate 250-125 Mg Tabs (Calcium-magnesium) .... Take 1 tablet by mouth once a day 10)  Fish Oil Oil (Fish oil) .... Take 1 tablet by mouth once a day 11)  Flax Oil (Flaxseed (linseed)) .... Take 1 tablet by mouth once a day 12)  Vitamin C 1000 Mg Tabs (ascorbic Acid)  .... Take 1 tablet by mouth two times a day 13)  Prednisone 10 Mg Tabs (Prednisone) .... Take by mouth as directed  Patient  Instructions: 1)  hold celebrex until last 2 days of prednisone  2)  take prednisone as follows  3)  3 pills once daily for 3 days then 4)  2 pills once daily for 3 days then  5)  1 pill once daily for 3 days then stop  6)  use heat and ice if it helps  7)  continue exercise bike and PT stretches  8)  we will send you for x ray at check out  Prescriptions: PREDNISONE 10 MG TABS (PREDNISONE) take by mouth as directed  #18 x 0   Entered and Authorized by:   Judith Part MD   Signed by:   Judith Part MD on 05/28/2008   Method used:   Print then Give to Patient   RxID:   [id number]   Prior Medications (reviewed today): PILOCARPINE HCL 5 MG TABS (PILOCARPINE HCL) three tabs by mouth daily CELEBREX 200 MG CAPS (CELECOXIB) Take 1 tablet by mouth once a day OXYTROL 3.9 MG/24HR PTTW (OXYBUTYNIN) one patch twice weekly as directed PROZAC 20 MG CAPS (FLUOXETINE HCL) Take 1 tablet by mouth once a day RESTASIS 0.05 % EMUL (CYCLOSPORINE) as directed ZETIA 10 MG TABS (EZETIMIBE) Take 1 tablet by mouth once a day FOLBIC 2.5-25-2 MG TABS (FA-PYRIDOXINE-CYANCOBALAMIN) Take 1 tablet by mouth once a day PRILOSEC 20 MG CPDR (OMEPRAZOLE) Take 1 tablet by mouth once a day CAL/MAG CITRATE 250-125 MG TABS (CALCIUM-MAGNESIUM) Take 1 tablet by mouth once a day FISH OIL   OIL (FISH OIL) Take 1 tablet by mouth once a day FLAX   OIL (FLAXSEED (LINSEED)) Take 1 tablet by mouth once a day VITAMIN C 1000 MG  TABS (ASCORBIC ACID) () Take 1 tablet by mouth two times a day Current Allergies (reviewed today): ! * STATINS Current Medications (including changes made in today's visit):  PILOCARPINE HCL 5 MG TABS (PILOCARPINE HCL) three tabs by mouth daily CELEBREX 200 MG CAPS (CELECOXIB) Take 1 tablet by mouth once a day OXYTROL 3.9 MG/24HR PTTW (OXYBUTYNIN) one patch twice weekly as directed PROZAC 20 MG CAPS (FLUOXETINE HCL) Take 1 tablet by mouth once a day RESTASIS 0.05 % EMUL (CYCLOSPORINE) as  directed ZETIA 10 MG TABS (EZETIMIBE) Take 1 tablet by mouth once a day FOLBIC 2.5-25-2 MG TABS (FA-PYRIDOXINE-CYANCOBALAMIN) Take 1 tablet by mouth once a day PRILOSEC 20 MG CPDR (OMEPRAZOLE) Take 1 tablet by mouth once a day CAL/MAG CITRATE 250-125 MG TABS (CALCIUM-MAGNESIUM) Take 1 tablet by mouth once a day FISH OIL   OIL (FISH OIL) Take 1 tablet by mouth once a day FLAX   OIL (FLAXSEED (LINSEED)) Take 1 tablet by mouth once a day * VITAMIN C 1000 MG  TABS (ASCORBIC ACID) Take 1 tablet by mouth two times a day PREDNISONE 10 MG TABS (PREDNISONE) take by mouth as directed

## 2010-03-09 NOTE — Assessment & Plan Note (Signed)
Summary: F/U/CLE   Vital Signs:  Patient profile:   65 year old female Height:      68.25 inches Weight:      217 pounds BMI:     32.87 Temp:     97.7 degrees F oral Pulse rate:   76 / minute Pulse rhythm:   regular BP sitting:   126 / 80  (left arm) Cuff size:   large  Vitals Entered By: Liane Comber (Jun 25, 2008 9:04 AM)  History of Present Illness: wt is down 3 more pounds diet is good  cannot get much exercise - can do some stationary bike without too much pain    last visit-  back pain tx with prednisone  hip x ray showed end stage deg changes- ref to ortho (Dr Farris Has )  then went to radiolgy for injection in hip - helped for a little while 2 weeks ago  was recommended she get hip replacement  pain is still about the same   is wanting to do the hip replacement   has a friend who is a physical trainer -  disc a different approach - aterolateral - at [facility]   last lipis fairly good with trig 97/ HDL 44/ LDL 107  AIC was 5.8  needs pain med - at night  is on celebrex 200 without problems    Allergies: 1)  ! * Statins  Past History:  Past Medical History:    GERD    Hyperlipidemia- statin intolerant     osteoarthritis  with Sjogren's syndrome    breast ca DCIS, BRCA 1 pos HER2 pos, ER2 pos (bilat mastectomy and oophrectomy)    urine incontinence    deg disk disease     depression (situational, very well controlled , and now takes prozac for sleep)     hx of vit D deficiency    AIC elevated to 6.2 (9/09) hyperglycemia     osteopenia 07--? from x ray with nl dexa     oncology Dr Park Breed     (04/22/2008)  Past Surgical History:    Tonsillectomy (1974)    Ruptured Disc 5th lumbar (1991)    Mastectomy (bilat) 2001    ovaries removed 2005    bilat knee replacement 2006    dexa nl in 2006      (04/22/2008)  Family History:    Family History of Arthritis    Family History Breast cancer 1st degree relative <50    Family History Diabetes 1st degree  relative (other relative)    Family History High cholesterol    Family History Hypertension    Family History Ovarian cancer    Family History of Prostate CA 1st degree relative <50    Family History of Stroke    psoriasis - brother     daugter with breast ca gene (04/22/2008)  Social History:    Retired Runner, broadcasting/film/video with master's degree    moved from [place] 3 y ago     Divorced    G3P3    Never Smoked    Alcohol use-no    Drug use-no    Regular exercise-yes     (04/22/2008)  Review of Systems General:  Denies fatigue, fever, and loss of appetite. Eyes:  Denies blurring. CV:  Denies chest pain or discomfort and palpitations. Resp:  Denies cough and wheezing. GI:  Denies abdominal pain and change in bowel habits. GU:  Denies dysuria. MS:  Complains of joint pain, low back pain, mid  back pain, and stiffness; denies joint redness and joint swelling. Derm:  Denies poor wound healing and rash. Neuro:  Complains of tingling; denies numbness and weakness. Psych:  mood is ok.  Physical Exam  General:  overweight but generally well appearing little exam done today Head:  Normocephalic and atraumatic without obvious abnormalities. No apparent alopecia or balding. Skin:  Intact without suspicious lesions or rashes Psych:  gait is labored due to pain   Impression & Recommendations:  Problem # 1:  OSTEOARTHRITIS, HIP (ICD-715.95) Assessment New disc use of celebrex with caution - watch for GI eff or swelling or inc bp I do not recommend use two times a day  given vicodiin for breakthrough pain - disc warnings/ risks with that  pt is deciding what to do in terms of planning hiip replacement at this time - disc this in detail  will ref to ortho in [place] that does a slt less invasive surgical type  Her updated medication list for this problem includes:    Celebrex 200 Mg Caps (Celecoxib) .Marland Kitchen... Take 1 tablet by mouth once a day    Vicodin 5-500 Mg Tabs (Hydrocodone-acetaminophen)  .Marland Kitchen... 1 by mouth up to every 6 hours as needed severe pain  Orders: Orthopedic Referral (Ortho)  Complete Medication List: 1)  Pilocarpine Hcl 5 Mg Tabs (Pilocarpine hcl) .... Three tabs by mouth daily 2)  Celebrex 200 Mg Caps (Celecoxib) .... Take 1 tablet by mouth once a day 3)  Oxytrol 3.9 Mg/24hr Pttw (Oxybutynin) .... One patch twice weekly as directed 4)  Prozac 20 Mg Caps (Fluoxetine hcl) .... Take 1 tablet by mouth once a day 5)  Restasis 0.05 % Emul (Cyclosporine) .... As directed 6)  Zetia 10 Mg Tabs (Ezetimibe) .... Take 1 tablet by mouth once a day 7)  Folbic 2.5-25-2 Mg Tabs (Fa-pyridoxine-cyancobalamin) .... Take 1 tablet by mouth once a day 8)  Prilosec 20 Mg Cpdr (Omeprazole) .... Take 1 tablet by mouth once a day 9)  Cal/mag Citrate 250-125 Mg Tabs (Calcium-magnesium) .... Take 1 tablet by mouth once a day 10)  Fish Oil Oil (Fish oil) .... Take 1 tablet by mouth twice a day 11)  Flax Oil (Flaxseed (linseed)) .... Take 1 tablet by mouth 2 times a day 12)  Vitamin C 1000 Mg Tabs (ascorbic Acid)  .... Take 1 tablet by mouth two times a day 13)  Vicodin 5-500 Mg Tabs (Hydrocodone-acetaminophen) .Marland Kitchen.. 1 by mouth up to every 6 hours as needed severe pain 14)  Bilateral Breast Prosthesis  .... For use as directed  Patient Instructions: 1)  we will do orthopedic referral at check out  2)  use vicodin with caution  Prescriptions: BILATERAL BREAST PROSTHESIS for use as directed  #1 pr x 1   Entered and Authorized by:   Judith Part MD   Signed by:   Judith Part MD on 06/25/2008   Method used:   Print then Give to Patient   RxID:   [id number] VICODIN 5-500 MG TABS (HYDROCODONE-ACETAMINOPHEN) 1 by mouth up to every 6 hours as needed severe pain  #30 x 0   Entered and Authorized by:   Judith Part MD   Signed by:   Judith Part MD on 06/25/2008   Method used:   Print then Give to Patient   RxID:   [id number]     Prior Medications (reviewed  today): PILOCARPINE HCL 5 MG TABS (PILOCARPINE HCL) three tabs by mouth daily CELEBREX  200 MG CAPS (CELECOXIB) Take 1 tablet by mouth once a day OXYTROL 3.9 MG/24HR PTTW (OXYBUTYNIN) one patch twice weekly as directed PROZAC 20 MG CAPS (FLUOXETINE HCL) Take 1 tablet by mouth once a day RESTASIS 0.05 % EMUL (CYCLOSPORINE) as directed ZETIA 10 MG TABS (EZETIMIBE) Take 1 tablet by mouth once a day FOLBIC 2.5-25-2 MG TABS (FA-PYRIDOXINE-CYANCOBALAMIN) Take 1 tablet by mouth once a day PRILOSEC 20 MG CPDR (OMEPRAZOLE) Take 1 tablet by mouth once a day CAL/MAG CITRATE 250-125 MG TABS (CALCIUM-MAGNESIUM) Take 1 tablet by mouth once a day FISH OIL   OIL (FISH OIL) Take 1 tablet by mouth twice a day FLAX   OIL (FLAXSEED (LINSEED)) Take 1 tablet by mouth 2 times a day VITAMIN C 1000 MG  TABS (ASCORBIC ACID) () Take 1 tablet by mouth two times a day VICODIN 5-500 MG TABS (HYDROCODONE-ACETAMINOPHEN) 1 by mouth up to every 6 hours as needed severe pain BILATERAL BREAST PROSTHESIS () for use as directed Current Allergies (reviewed today): ! * STATINS Current Medications (including changes made in today's visit):  PILOCARPINE HCL 5 MG TABS (PILOCARPINE HCL) three tabs by mouth daily CELEBREX 200 MG CAPS (CELECOXIB) Take 1 tablet by mouth once a day OXYTROL 3.9 MG/24HR PTTW (OXYBUTYNIN) one patch twice weekly as directed PROZAC 20 MG CAPS (FLUOXETINE HCL) Take 1 tablet by mouth once a day RESTASIS 0.05 % EMUL (CYCLOSPORINE) as directed ZETIA 10 MG TABS (EZETIMIBE) Take 1 tablet by mouth once a day FOLBIC 2.5-25-2 MG TABS (FA-PYRIDOXINE-CYANCOBALAMIN) Take 1 tablet by mouth once a day PRILOSEC 20 MG CPDR (OMEPRAZOLE) Take 1 tablet by mouth once a day CAL/MAG CITRATE 250-125 MG TABS (CALCIUM-MAGNESIUM) Take 1 tablet by mouth once a day FISH OIL   OIL (FISH OIL) Take 1 tablet by mouth twice a day FLAX   OIL (FLAXSEED (LINSEED)) Take 1 tablet by mouth 2 times a day * VITAMIN C 1000 MG  TABS (ASCORBIC ACID)  Take 1 tablet by mouth two times a day VICODIN 5-500 MG TABS (HYDROCODONE-ACETAMINOPHEN) 1 by mouth up to every 6 hours as needed severe pain * BILATERAL BREAST PROSTHESIS for use as directed

## 2010-03-09 NOTE — Consult Note (Signed)
Summary: [facility]   [facility]edic [facility]   Imported By: Lanelle Bal 07/04/2008 11:22:21  _____________________________________________________________________  External Attachment:    Type:   Image     Comment:   External Document

## 2010-03-11 NOTE — Assessment & Plan Note (Signed)
Summary: cpx/alc   Vital Signs:  Patient profile:   65 year old female Height:      67.75 inches Weight:      223.75 pounds BMI:     34.40 Temp:     98.5 degrees F oral Pulse rate:   68 / minute Pulse rhythm:   regular BP sitting:   120 / 76  (left arm) Cuff size:   large  Vitals Entered By: Lewanda Rife LPN (January 19, 2010 2:18 PM) CC: CPX LMP 06/24/1999   History of Present Illness: here for health mt exam and also to disc chronic med problems   wt is up 14lb she is aware of that -- over the holidays  originally lost her wt over long peroid of time  craves sweets and sugars -- was interested in phenteramine does water aerobics 3 d per week plans to add her stationary bike    bp good at 120/76  lipids on zetia are improved with LDL 126 down from 184 is also eating healthy foods   vit D is high at 91--hx of deficiency was taking 1000 international units per day     osteopenia past and now normal  rev dexa 11/10-- normal ranges for T scores -- good news   hx of bca- bilat mastect and oophrect  pap 9/09 does not see a gyn will plan to do this every 3 years does not have ovaries   colonosc nl 5/11- re check 5 y  Tdap 9/08 had flu shot this year zostavax 07  hyperglycemia stable AIC at 5.8  bilat mastectomy -- -- no mammograms  no lumps or change in exam  has a cyst over scar -- derm decided not to remove it unless she needs  not getting bigger   no longer takes celebrex since her hip replacement   pneumovax - wants one today , has had pneumonia in the past     Allergies: 1)  ! * Statins 2)  ! Augmentin  Past History:  Past Medical History: Last updated: 04/22/2008 GERD Hyperlipidemia- statin intolerant  osteoarthritis  with Sjogren's syndrome breast ca DCIS, BRCA 1 pos HER2 pos, ER2 pos (bilat mastectomy and oophrectomy) urine incontinence deg disk disease  depression (situational, very well controlled , and now takes prozac for sleep)    hx of vit D deficiency AIC elevated to 6.2 (9/09) hyperglycemia  osteopenia 07--? from x ray with nl dexa   oncology Dr Park Breed  Family History: Last updated: 12/12/2008 Family History of Arthritis Family History Breast cancer 1st degree relative <50 Family History Diabetes 1st degree relative (other relative) Family History High cholesterol Family History Hypertension Family History Ovarian cancer Family History of Prostate CA 1st degree relative <50 Family History of Stroke psoriasis - brother  daugter with breast ca gene brother brain ca brother brca gene and colon cancer  mother with dementia   Social History: Last updated: 04/22/2008 Retired Runner, broadcasting/film/video with master's degree moved from [place] 3 y ago  Divorced G3P3 Never Smoked Alcohol use-no Drug use-no Regular exercise-yes  Risk Factors: Exercise: yes (04/22/2008)  Risk Factors: Smoking Status: never (04/22/2008)  Past Surgical History: Tonsillectomy (1974) Ruptured Disc 5th lumbar (1610) Mastectomy (bilat) 2001 ovaries removed 2005 bilat knee replacement 2006 dexa nl in 2006  colonoscopy 5/07-- normal rec re check 3 y  hip replacement 2011  Review of Systems General:  Denies fatigue, loss of appetite, and malaise. Eyes:  Denies blurring and eye irritation. CV:  Denies chest pain  or discomfort, lightheadness, palpitations, and shortness of breath with exertion. Resp:  Denies cough, shortness of breath, and wheezing. GI:  Denies abdominal pain, change in bowel habits, indigestion, and nausea. GU:  Denies abnormal vaginal bleeding, discharge, dysuria, and urinary frequency. MS:  Denies joint pain, joint redness, and joint swelling. Derm:  Denies itching, lesion(s), poor wound healing, and rash. Neuro:  Denies numbness and tingling. Psych:  mood is ok . Endo:  Denies cold intolerance, excessive thirst, excessive urination, and heat intolerance. Heme:  Denies abnormal bruising and bleeding.  Physical  Exam  General:  overweight but generally well appearing  Head:  normocephalic, atraumatic, and no abnormalities observed.   Eyes:  vision grossly intact, pupils equal, pupils round, and pupils reactive to light.  no conjunctival pallor, injection or icterus  Ears:  R ear normal and L ear normal.   Nose:  no nasal discharge.   Mouth:  pharynx pink and moist.   Neck:  supple with full rom and no masses or thyromegally, no JVD or carotid bruit  Chest Wall:  No deformities, masses, or tenderness noted. Breasts:  s/p mastectomy-- no changes in exam stable b9 app cyst over scar in mid chest  Lungs:  Normal respiratory effort, chest expands symmetrically. Lungs are clear to auscultation, no crackles or wheezes. Heart:  Normal rate and regular rhythm. S1 and S2 normal without gallop, murmur, click, rub or other extra sounds. Abdomen:  Bowel sounds positive,abdomen soft and non-tender without masses, organomegaly or hernias noted. no renal bruits  Msk:  No deformity or scoliosis noted of thoracic or lumbar spine.  no acute joint changes  Pulses:  R and L carotid,radial,femoral,dorsalis pedis and posterior tibial pulses are full and equal bilaterally Extremities:  No clubbing, cyanosis, edema, or deformity noted with normal full range of motion of all joints.   Neurologic:  sensation intact to light touch, gait normal, and DTRs symmetrical and normal.   Skin:  Intact without suspicious lesions or rashes Cervical Nodes:  No lymphadenopathy noted Axillary Nodes:  No palpable lymphadenopathy Inguinal Nodes:  No significant adenopathy Psych:  normal affect, talkative and pleasant    Impression & Recommendations:  Problem # 1:  HEALTH MAINTENANCE EXAM (ICD-V70.0) Assessment Comment Only reviewed health habits including diet, exercise and skin cancer prevention reviewed health maintenance list and family history disc imp of wt loss to overall health rev wellness labs in detail  Problem # 2:   SPECIAL SCREENING FOR OSTEOPOROSIS (ICD-V82.81) Assessment: Unchanged hx of osteopenia but most recent dexa is nl  rev ca and vit d and T scores  needs to exercise   Problem # 3:  UNSPECIFIED VITAMIN D DEFICIENCY (ICD-268.9) Assessment: Improved actually oversuppl now will take 800 international units daily and continue to monitor  Problem # 4:  HYPERGLYCEMIA (ICD-790.29) Assessment: Unchanged  stable with AIC below 6 disc healthy diet (low simple sugar/ choose complex carbs/ low sat fat) diet and exercise in detail  will continue to monitor addressed wt gain and plan to loose  Labs Reviewed: Creat: 0.6 (01/12/2010)     Problem # 5:  HYPERLIPIDEMIA (ICD-272.4) Assessment: Improved  much imp with diet and zetia enc to keep it up  rev low sat fat diet today Her updated medication list for this problem includes:    Zetia 10 Mg Tabs (Ezetimibe) .Marland Kitchen... 1 by mouth once daily  Labs Reviewed: SGOT: 19 (01/12/2010)   SGPT: 13 (01/12/2010)   HDL:54.40 (01/12/2010), 54.30 (02/11/2009)  LDL:126 (01/12/2010), 107 (04/22/2008)  Chol:193 (01/12/2010), 254 (02/11/2009)  Trig:64.0 (01/12/2010), 102.0 (02/11/2009)  Complete Medication List: 1)  Pilocarpine Hcl 5 Mg Tabs (Pilocarpine hcl) .... Three tabs by mouth daily 2)  Oxytrol 3.9 Mg/24hr Pttw (Oxybutynin) .... One patch twice weekly as directed 3)  Prozac 20 Mg Caps (Fluoxetine hcl) .... Take 1 tablet by mouth once a day 4)  Restasis 0.05 % Emul (Cyclosporine) .... As directed 5)  Prilosec 20 Mg Cpdr (Omeprazole) .... Take 1 tablet by mouth once a day 6)  Cal/mag Citrate 250-125 Mg Tabs (Calcium-magnesium) .... Take 1 tablet by mouth once a day 7)  Fish Oil Oil (Fish oil) .... Take 1 tablet by mouth twice a day 8)  Flax Oil (Flaxseed (linseed)) .... Take 1 tablet by mouth 2 times a day 9)  Vitamin C 1000 Mg Tabs (ascorbic Acid)  .... Take 1 tablet by mouth two times a day 10)  Bilateral Breast Prosthesis  .... For use as directed 11)   Zetia 10 Mg Tabs (Ezetimibe) .Marland Kitchen.. 1 by mouth once daily 12)  Vitamin D 800 Iu  .... Take 1 tablet by mouth once a day  Other Orders: Pneumococcal Vaccine (16109) Admin 1st Vaccine (60454)  Patient Instructions: 1)  cut your vitamin D to 800 international units daily  2)  work on diet and exercise for wt loss  3)  medicines refilled 4)  zetia and diet are working well 5)  pneumonia vaccine today Prescriptions: ZETIA 10 MG TABS (EZETIMIBE) 1 by mouth once daily  #30 x 11   Entered and Authorized by:   Judith Part MD   Signed by:   Judith Part MD on 01/19/2010   Method used:   Print then Give to Patient   RxID:   [id number] PROZAC 20 MG CAPS (FLUOXETINE HCL) Take 1 tablet by mouth once a day  #30 x 11   Entered and Authorized by:   Judith Part MD   Signed by:   Judith Part MD on 01/19/2010   Method used:   Print then Give to Patient   RxID:   [id number] OXYTROL 3.9 MG/24HR PTTW (OXYBUTYNIN) one patch twice weekly as directed  #8 x 11   Entered and Authorized by:   Judith Part MD   Signed by:   Judith Part MD on 01/19/2010   Method used:   Print then Give to Patient   RxID:   [id number] PILOCARPINE HCL 5 MG TABS (PILOCARPINE HCL) three tabs by mouth daily  #90 x 11   Entered and Authorized by:   Judith Part MD   Signed by:   Judith Part MD on 01/19/2010   Method used:   Print then Give to Patient   RxID:   [id number]    Orders Added: 1)  Pneumococcal Vaccine [90732] 2)  Admin 1st Vaccine [90471] 3)  Est. Patient 40-64 years [99396]   Immunization History:  Influenza Immunization History:    Influenza:  historical (12/06/2009)  Immunizations Administered:  Pneumonia Vaccine:    Vaccine Type: Pneumovax    Site: right deltoid    Mfr: Merck    Dose: 0.5 ml    Route: IM    Given by: Lewanda Rife LPN    Exp. Date: 06/04/2011    Lot #: [id number]    VIS given: 01/12/09 version given January 19, 2010.   Immunization  History:  Influenza Immunization History:    Influenza:  Historical (12/06/2009)  Immunizations Administered:  Pneumonia  Vaccine:    Vaccine Type: Pneumovax    Site: right deltoid    Mfr: Merck    Dose: 0.5 ml    Route: IM    Given by: Lewanda Rife LPN    Exp. Date: 06/04/2011    Lot #: [id number]    VIS given: 01/12/09 version given January 19, 2010.  Current Allergies (reviewed today): ! * STATINS ! AUGMENTIN   Preventive Care Screening  T-score L femur:    Date:  12/29/2008    Results:  0.0    T-score L-Spine:    Date:  12/29/2008    Results:  1.8   Bone Density:    Date:  12/29/2008    Results:  normal std dev

## 2010-11-22 ENCOUNTER — Other Ambulatory Visit: Payer: Self-pay | Admitting: *Deleted

## 2010-11-22 MED ORDER — OXYBUTYNIN 3.9 MG/24HR TD PTTW: 1.0000 | MEDICATED_PATCH | TRANSDERMAL | Status: DC

## 2010-11-22 MED ORDER — EZETIMIBE 10 MG PO TABS: 10.0000 mg | ORAL_TABLET | Freq: Every day | ORAL | Status: DC

## 2010-11-22 NOTE — Telephone Encounter (Signed)
Px written for call in   

## 2010-11-22 NOTE — Telephone Encounter (Signed)
Unable to reach [place] [phone]. Will try again.

## 2010-11-23 NOTE — Telephone Encounter (Signed)
Medication phoned to [facility] [phone] pharmacy as instructed.Spoke with [employer].

## 2010-11-24 ENCOUNTER — Encounter: Payer: Self-pay | Admitting: Family Medicine

## 2010-11-26 ENCOUNTER — Ambulatory Visit (INDEPENDENT_AMBULATORY_CARE_PROVIDER_SITE_OTHER)
Admission: RE | Admit: 2010-11-26 | Discharge: 2010-11-26 | Disposition: A | Discharge status: Home / Self Care | Payer: Managed Care, Other (non HMO) | Source: Ambulatory Visit | Attending: Family Medicine | Admitting: Family Medicine

## 2010-11-26 ENCOUNTER — Ambulatory Visit (INDEPENDENT_AMBULATORY_CARE_PROVIDER_SITE_OTHER): Payer: Commercial Indemnity | Admitting: Family Medicine

## 2010-11-26 ENCOUNTER — Encounter: Payer: Self-pay | Admitting: Family Medicine

## 2010-11-26 VITALS — BP 128/82 | HR 80 | Temp 98.0°F | Ht 67.75 in | Wt 227.7 lb

## 2010-11-26 DIAGNOSIS — R109 Unspecified abdominal pain: Secondary | ICD-10-CM

## 2010-11-26 DIAGNOSIS — Z23 Encounter for immunization: Secondary | ICD-10-CM

## 2010-11-26 IMAGING — CR DG ABDOMEN 2V
2 series · 2 of 2 positions shown · non-contrast
Comparison: None.

CLINICAL DATA: Abdominal pain and cramping.

ABDOMEN - 2 VIEW

[view not recorded (1 of 2)]
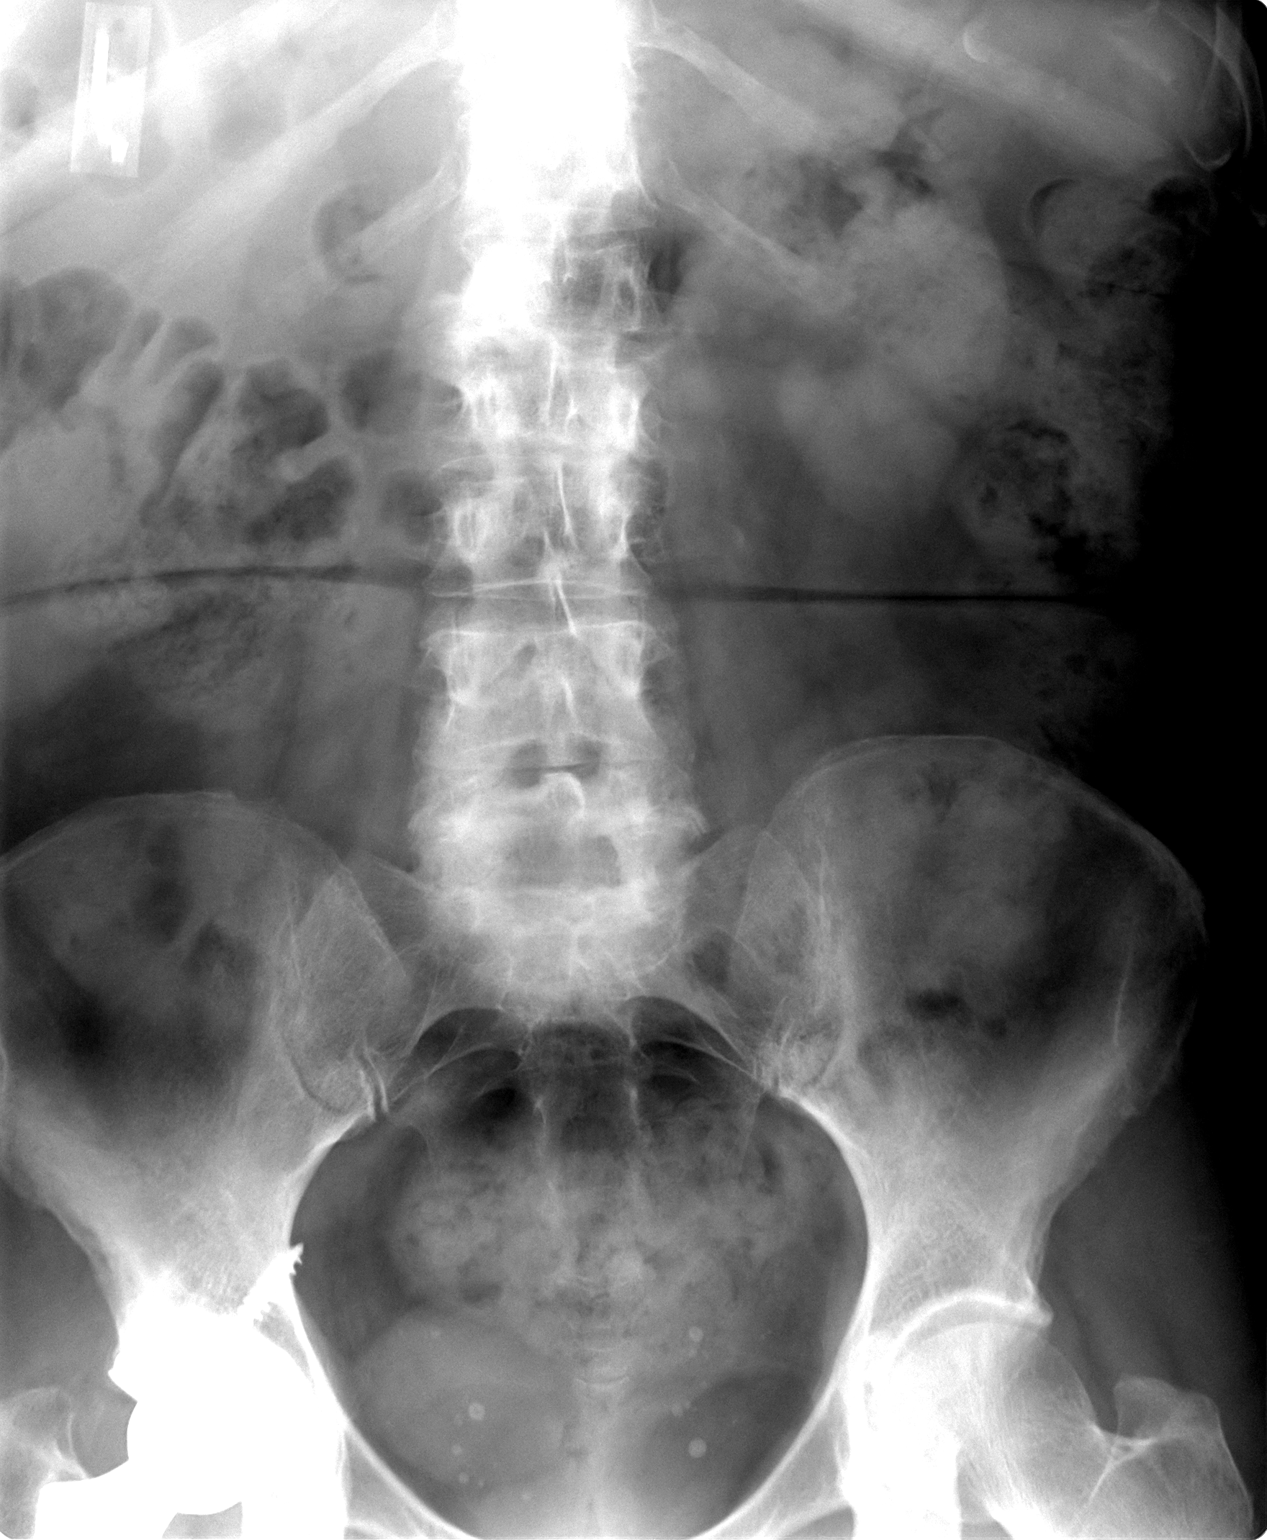

[view not recorded (2 of 2)]
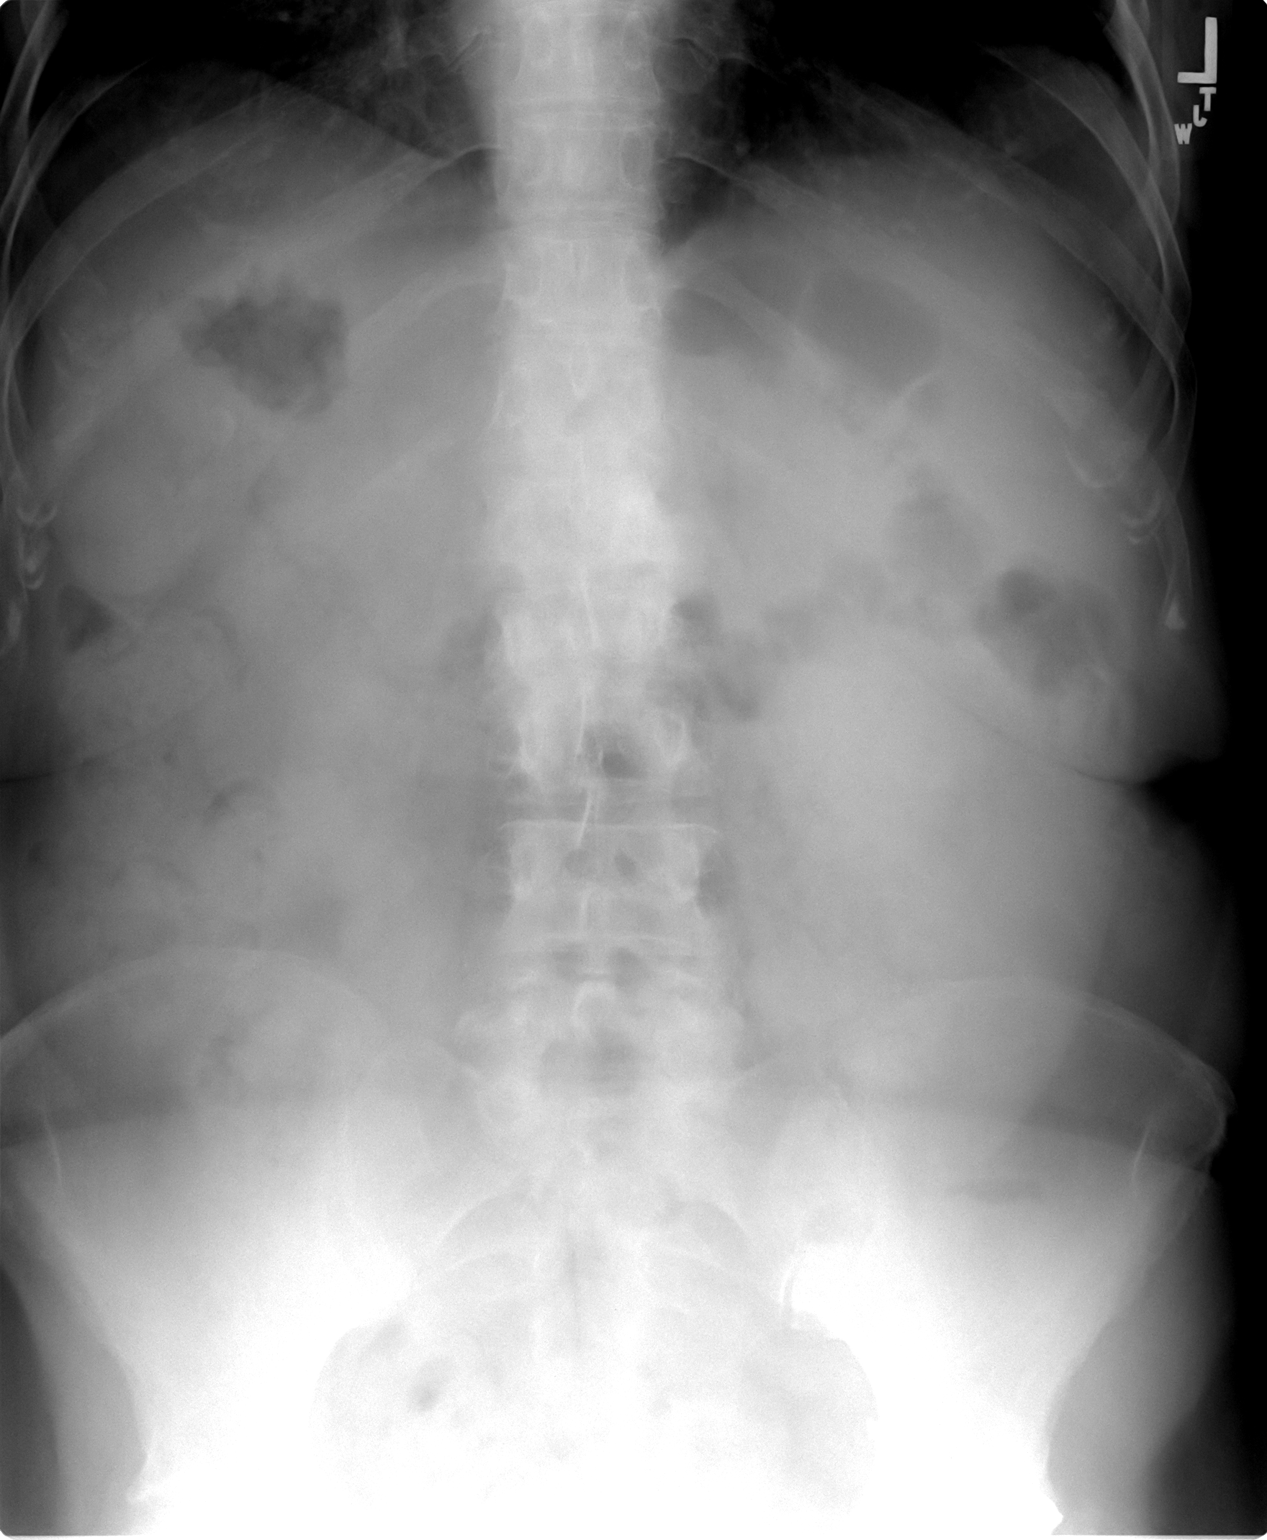

[2 of 2 positions shown; findings below may reference images not displayed]

FINDINGS: No free intraperitoneal air is identified.  There is no
evidence of bowel obstruction.  Large stool burden is noted.
Multilevel lumbar spondylosis is present.  The patient is status
post right hip replacement.
IMPRESSION: Large stool burden.  No acute finding.

## 2010-11-26 MED ORDER — PROMETHAZINE HCL 25 MG PO TABS: 25.0000 mg | ORAL_TABLET | Freq: Three times a day (TID) | ORAL | Status: DC | PRN

## 2010-11-26 MED ORDER — HYOSCYAMINE SULFATE 0.125 MG SL SUBL: 0.1250 mg | SUBLINGUAL_TABLET | SUBLINGUAL | Status: AC | PRN

## 2010-11-26 NOTE — Patient Instructions (Signed)
Abdominal x ray today  If recurrent symptoms - let me know  Drink good amount of fluids  Eat fiber - you can also try metamucil daily  Avoid nuts/ seeds and popcorn for now (get seeds out of vegetables first )  Keep a diet diary  If blood in stool or fever or other symptoms - let me know  Try levsin for cramps  Then if necessary - the phenergan for nausea - I sent these to your pharmacy

## 2010-11-26 NOTE — Progress Notes (Signed)
Subjective:    Patient ID: Lauren [name], female    DOB: 06/19/1945, 65 y.o.   MRN: [id number]  HPI Here for GI problem  Pt has low abd pain on and off as if she needs to move her bowels - and then does not  2 episodes - one in aug and one in sept  Lasting about 24 hours  Wakes up in the middle of the night -- with low abdominal cramping - cannot have a bm , and gets nauseated and sweaty 2nd episode -- had pain the next day -- suppos and enema no help  Did not have bp for 4 d after that  Then back to normal -- with bms    Last week -- had a virus and vomiting -- others in the family got sick -that was different  No diarrhea , however , no fever - but did get a headache - lasted few days    Wonders if it is related to her diet  First time had eaten out of town -- quizno sub and salad and spaghetti 2nd time out of town salad with veggie pizza   Has had colonoscopy 1 year ago - all was fine but few adenomatous polyps   Has hx of diverticulosis -- even diverticulitis  Found that on MRI years ago as well   ? If problems with salads   Wt is up 4 lb with bmi of 39  Getting flu shot today   Only abd surgery- was lap oophrectomy   Patient Active Problem List  Diagnoses  . UNSPECIFIED VITAMIN D DEFICIENCY  . HYPERLIPIDEMIA  . GERD  . OSTEOARTHRITIS  . HYPERGLYCEMIA  . Abdominal cramps   Past Medical History  Diagnosis Date  . GERD (gastroesophageal reflux disease)   . HLD (hyperlipidemia)     statin intolerant  . Osteoarthritis   . Sjogren's syndrome   . Breast cancer     DCIS; BRCA 1 pos HER2 pos, ER2 pos  . Urine incontinence   . DDD (degenerative disc disease)   . Reactive depression (situational)     very well controlled, and now takes prozac for sleep  . Vitamin D deficiency   . Hyperglycemia 9/09    A1C elvated to 6.2  . Osteopenia 2007    ? from xray with nl dexa   Past Surgical History  Procedure Date  . Tonsillectomy 1974  . Lumbar disc surgery 1991      Ruptured disk L5  . Mastectomy 2001    bilateral  . Bilateral oophorectomy 205  . Total knee arthroplasty 2006    bilateral  . Colonoscopy 5/07    normal; recheck 3 years  . Total hip arthroplasty 2011   History  Substance Use Topics  . Smoking status: Never Smoker   . Smokeless tobacco: Not on file  . Alcohol Use: No   Family History  Problem Relation Age of Onset  . Arthritis      family hx  . Breast cancer      1st degree relative <50  . Diabetes      1st degree relative  . Hyperlipidemia      family hx  . Hypertension      family hx  . Ovarian cancer      family hx  . Prostate cancer      1st degree relative <50  . Stroke      family hx  . Dementia Mother   . Psoriasis Brother   .  Other Daughter     BRCA gene  . Brain cancer Brother   . Colon cancer Brother     also has BRCA gene   Allergies  Allergen Reactions  . ZOX:[id number]+[id number] Acid+Aspartame     REACTION: n/v  . Statins     REACTION: joint pain   Current Outpatient Prescriptions on File Prior to Visit  Medication Sig Dispense Refill  . Ascorbic Acid (VITAMIN C) 1000 MG tablet Take 1,000 mg by mouth 2 (two) times daily.        . Calcium-Magnesium 250-125 MG TABS Take 1 tablet by mouth daily.        . Cholecalciferol (VITAMIN D3) 400 UNITS CAPS Take 400 Units by mouth daily.       . cycloSPORINE (RESTASIS) 0.05 % ophthalmic emulsion 1 drop as directed.        . ezetimibe (ZETIA) 10 MG tablet Take 1 tablet (10 mg total) by mouth daily.  90 tablet  3  . fish oil-omega-3 fatty acids 1000 MG capsule Take 1 g by mouth 2 (two) times daily.        . Flax OIL Take 1 capsule by mouth daily.       Marland Kitchen FLUoxetine (PROZAC) 20 MG capsule Take 20 mg by mouth daily.        Marland Kitchen omeprazole (PRILOSEC) 20 MG capsule Take 20 mg by mouth daily.        Marland Kitchen oxybutynin (OXYTROL) 3.9 MG/24HR Place 1 patch onto the skin 2 (two) times a week.  8 patch  11  . pilocarpine (SALAGEN) 5 MG tablet Take 5 mg by  mouth 3 (three) times daily.             Review of Systems Review of Systems  Constitutional: Negative for fever, appetite change, fatigue and unexpected weight change.  Eyes: Negative for pain and visual disturbance.  Respiratory: Negative for cough and shortness of breath.   Cardiovascular: Negative for cp or palpitations    Gastrointestinal: Negative for diarrhea or blood in stool or dark stools   Genitourinary: Negative for urgency and frequency.  Skin: Negative for pallor or rash   Neurological: Negative for weakness, light-headedness, numbness and headaches.  Hematological: Negative for adenopathy. Does not bruise/bleed easily.  Psychiatric/Behavioral: Negative for dysphoric mood. The patient is not nervous/anxious.         Objective:   Physical Exam  Constitutional: She appears well-developed and well-nourished. No distress.       overwt and well appearing   HENT:  Head: Normocephalic and atraumatic.  Mouth/Throat: Oropharynx is clear and moist.  Eyes: Conjunctivae and EOM are normal. Pupils are equal, round, and reactive to light. No scleral icterus.  Neck: Normal range of motion. Neck supple. No JVD present. No thyromegaly present.  Cardiovascular: Regular rhythm and normal heart sounds.   Pulmonary/Chest: Effort normal and breath sounds normal. No respiratory distress. She has no wheezes.  Abdominal: Soft. Bowel sounds are normal. She exhibits no distension and no mass. There is no tenderness. There is no rebound and no guarding.       Nl abd exam today  Musculoskeletal: She exhibits no edema.  Lymphadenopathy:    She has no cervical adenopathy.  Neurological: She is alert. She has normal reflexes. She exhibits normal muscle tone. Coordination normal.       No tremor   Skin: Skin is warm and dry. No rash noted. No erythema. No pallor.       No jaundice  Psychiatric: She has a normal mood and affect.          Assessment & Plan:

## 2010-11-26 NOTE — Assessment & Plan Note (Addendum)
Intermittent, poss diet related with hx of diverticulosis  Eats high fiber - lots of nuts and seeds  Symptom free now  Disc diet diary/ avoidance nuts and seeds/ trial of fiber abd x ray today - to see if any signs of ileus or severe constipation  levsin prn  Phenergan prn  Update if symptoms return- will consider GI appt Note- is utd on colonosc

## 2011-01-14 ENCOUNTER — Encounter: Payer: Self-pay | Admitting: Family Medicine

## 2011-01-14 ENCOUNTER — Ambulatory Visit (INDEPENDENT_AMBULATORY_CARE_PROVIDER_SITE_OTHER): Payer: Managed Care, Other (non HMO) | Admitting: Family Medicine

## 2011-01-14 VITALS — BP 126/72 | HR 70 | Temp 97.8°F | Wt 227.8 lb

## 2011-01-14 DIAGNOSIS — J40 Bronchitis, not specified as acute or chronic: Secondary | ICD-10-CM | POA: Insufficient documentation

## 2011-01-14 MED ORDER — AZITHROMYCIN 250 MG PO TABS: ORAL_TABLET | ORAL | Status: AC

## 2011-01-14 MED ORDER — BENZONATATE 100 MG PO CAPS: 100.0000 mg | ORAL_CAPSULE | Freq: Three times a day (TID) | ORAL | Status: DC | PRN

## 2011-01-14 NOTE — Assessment & Plan Note (Signed)
Anticipate bronchitis. Given upcoming surgery, will treat aggressively with zpack. Update [place] if sxs not improving Tessalon for cough prn.

## 2011-01-14 NOTE — Patient Instructions (Signed)
Could be developing bronchitis. Take zpack. Update [place] if symptoms not improving as expected. May continue mucinex D. Good to see you today, call us with questions.

## 2011-01-14 NOTE — Progress Notes (Signed)
  Subjective:    Patient ID: Lauren [name], female    DOB: [birth year], 65 y.o.   MRN: [id number]  HPI CC: congestion, cough  7d h/o sinus congestion, HA, now getting into chest.  Cough dry overall.  Recent air travel.  Has upcoming appt for lacrimal duct tumor surgery scheduled for Thursday.  Taking mucinex D which is helping  No abd pain, n/v, f/c, diarrhea, tooth pain, body aches, myalgia, arthralgia.  No sick contacts .  No smokers at home.  No h/o asthma/COPD  Review of Systems Per HPI    Objective:   Physical Exam  Nursing note and vitals reviewed. Constitutional: She appears well-developed and well-nourished. No distress.  HENT:  Head: Normocephalic and atraumatic.  Right Ear: Hearing, tympanic membrane, external ear and ear canal normal.  Left Ear: Hearing, tympanic membrane, external ear and ear canal normal.  Nose: No mucosal edema or rhinorrhea. Right sinus exhibits no maxillary sinus tenderness and no frontal sinus tenderness. Left sinus exhibits no maxillary sinus tenderness and no frontal sinus tenderness.  Mouth/Throat: Uvula is midline and mucous membranes are normal. Posterior oropharyngeal erythema present. No oropharyngeal exudate, posterior oropharyngeal edema or tonsillar abscesses.  Eyes: Conjunctivae and EOM are normal. Pupils are equal, round, and reactive to light. No scleral icterus.  Neck: Normal range of motion. Neck supple.  Cardiovascular: Normal rate, regular rhythm, normal heart sounds and intact distal pulses.   No murmur heard. Pulmonary/Chest: Effort normal and breath sounds normal. No respiratory distress. She has no wheezes. She has no rales.  Lymphadenopathy:    She has no cervical adenopathy.  Skin: Skin is warm and dry. No rash noted.      Assessment & Plan:

## 2011-01-20 DIAGNOSIS — H04123 Dry eye syndrome of bilateral lacrimal glands: Secondary | ICD-10-CM | POA: Insufficient documentation

## 2011-02-06 ENCOUNTER — Other Ambulatory Visit: Payer: Self-pay | Admitting: Family Medicine

## 2011-02-06 DIAGNOSIS — R7309 Other abnormal glucose: Secondary | ICD-10-CM

## 2011-02-06 DIAGNOSIS — E785 Hyperlipidemia, unspecified: Secondary | ICD-10-CM

## 2011-02-06 DIAGNOSIS — K219 Gastro-esophageal reflux disease without esophagitis: Secondary | ICD-10-CM

## 2011-02-06 DIAGNOSIS — E559 Vitamin D deficiency, unspecified: Secondary | ICD-10-CM

## 2011-02-06 NOTE — Telephone Encounter (Signed)
Message copied by [name] on Sun Feb 06, 2011 12:51 PM ------      Message from: Alvina Chou      Created: Wed Feb 02, 2011 11:22 AM      Regarding: lab orders for 02-09-10       Patient is scheduled for CPX labs, please order future labs, Thanks , Camelia Eng

## 2011-02-10 ENCOUNTER — Other Ambulatory Visit (INDEPENDENT_AMBULATORY_CARE_PROVIDER_SITE_OTHER): Payer: Commercial Indemnity

## 2011-02-10 DIAGNOSIS — E785 Hyperlipidemia, unspecified: Secondary | ICD-10-CM

## 2011-02-10 DIAGNOSIS — K219 Gastro-esophageal reflux disease without esophagitis: Secondary | ICD-10-CM

## 2011-02-10 DIAGNOSIS — R7309 Other abnormal glucose: Secondary | ICD-10-CM

## 2011-02-10 DIAGNOSIS — E559 Vitamin D deficiency, unspecified: Secondary | ICD-10-CM | POA: Diagnosis not present

## 2011-02-10 LAB — LIPID PANEL
Cholesterol: 191 mg/dL (ref 0–200)
HDL: 58.2 mg/dL (ref >39.00)
LDL Cholesterol: 117 mg/dL — ABNORMAL HIGH (ref 0–99)
Total CHOL/HDL Ratio: 3
Triglycerides: 78 mg/dL (ref 0.0–149.0)
VLDL: 15.6 mg/dL (ref 0.0–40.0)

## 2011-02-10 LAB — CBC WITH DIFFERENTIAL/PLATELET
Basophils Absolute: 0 10*3/uL (ref 0.0–0.1)
Basophils Relative: 0.5 % (ref 0.0–3.0)
Eosinophils Absolute: 0.2 10*3/uL (ref 0.0–0.7)
Eosinophils Relative: 2.8 % (ref 0.0–5.0)
HCT: 43 % (ref 36.0–46.0)
Hemoglobin: 14.3 g/dL (ref 12.0–15.0)
Lymphocytes Relative: 21.2 % (ref 12.0–46.0)
Lymphs Abs: 1.7 10*3/uL (ref 0.7–4.0)
MCHC: 33.4 g/dL (ref 30.0–36.0)
MCV: 89.6 fl (ref 78.0–100.0)
Monocytes Absolute: 0.5 10*3/uL (ref 0.1–1.0)
Monocytes Relative: 6.5 % (ref 3.0–12.0)
Neutro Abs: 5.5 10*3/uL (ref 1.4–7.7)
Neutrophils Relative %: 69 % (ref 43.0–77.0)
Platelets: 218 10*3/uL (ref 150.0–400.0)
RBC: 4.79 Mil/uL (ref 3.87–5.11)
RDW: 14.8 % — ABNORMAL HIGH (ref 11.5–14.6)
WBC: 7.9 10*3/uL (ref 4.5–10.5)

## 2011-02-10 LAB — COMPREHENSIVE METABOLIC PANEL
ALT: 15 U/L (ref 0–35)
AST: 19 U/L (ref 0–37)
Albumin: 4 g/dL (ref 3.5–5.2)
Alkaline Phosphatase: 79 U/L (ref 39–117)
BUN: 20 mg/dL (ref 6–23)
CO2: 27 mEq/L (ref 19–32)
Calcium: 9.2 mg/dL (ref 8.4–10.5)
Chloride: 105 mEq/L (ref 96–112)
Creatinine, Ser: 0.5 mg/dL (ref 0.4–1.2)
GFR: 131.27 mL/min (ref >60.00)
Glucose, Bld: 97 mg/dL (ref 70–99)
Potassium: 4.4 mEq/L (ref 3.5–5.1)
Sodium: 140 mEq/L (ref 135–145)
Total Bilirubin: 0.4 mg/dL (ref 0.3–1.2)
Total Protein: 7.2 g/dL (ref 6.0–8.3)

## 2011-02-10 LAB — HEMOGLOBIN A1C: Hgb A1c MFr Bld: 6 % (ref 4.6–6.5)

## 2011-02-10 LAB — TSH: TSH: 1.05 u[IU]/mL (ref 0.35–5.50)

## 2011-02-11 LAB — VITAMIN D 25 HYDROXY (VIT D DEFICIENCY, FRACTURES): Vit D, 25-Hydroxy: 65 ng/mL (ref 30–89)

## 2011-02-14 ENCOUNTER — Encounter: Payer: Self-pay | Admitting: Family Medicine

## 2011-02-14 ENCOUNTER — Ambulatory Visit (INDEPENDENT_AMBULATORY_CARE_PROVIDER_SITE_OTHER): Payer: Commercial Indemnity | Admitting: Family Medicine

## 2011-02-14 ENCOUNTER — Other Ambulatory Visit (HOSPITAL_COMMUNITY)
Admission: RE | Admit: 2011-02-14 | Discharge: 2011-02-14 | Disposition: A | Discharge status: Home / Self Care | Payer: Medicare Other | Source: Ambulatory Visit | Attending: Family Medicine | Admitting: Family Medicine

## 2011-02-14 VITALS — BP 126/76 | HR 84 | Temp 98.2°F | Ht 67.75 in | Wt 230.2 lb

## 2011-02-14 DIAGNOSIS — R7309 Other abnormal glucose: Secondary | ICD-10-CM | POA: Diagnosis not present

## 2011-02-14 DIAGNOSIS — E559 Vitamin D deficiency, unspecified: Secondary | ICD-10-CM

## 2011-02-14 DIAGNOSIS — Z1159 Encounter for screening for other viral diseases: Secondary | ICD-10-CM | POA: Diagnosis not present

## 2011-02-14 DIAGNOSIS — Z01419 Encounter for gynecological examination (general) (routine) without abnormal findings: Secondary | ICD-10-CM | POA: Diagnosis not present

## 2011-02-14 DIAGNOSIS — E785 Hyperlipidemia, unspecified: Secondary | ICD-10-CM | POA: Diagnosis not present

## 2011-02-14 NOTE — Progress Notes (Signed)
Subjective:    Patient ID: Lauren [name], female    DOB: May 06, 1945, 66 y.o.   MRN: [id number]  HPI Here for check up of chronic medical conditions and to review health mt list   Is doing great  Had one uri - saw Dr Reece Agar -- and got better    Wt is up 3 lb with bmi of 35 Had great holidays - started back with a wt loss program  Water aerobics 3 times per week   bp is good at 126/76  Hyperglycemia a1c 6.0- up from 5.8=- a lot of holiday eating - lots of sugar  Diet- back on target (involved in a program )  Exercise- back on target    Hx of vit D def  Level 65 which is good - is good about her ca and vit D  dexa -- 1-2 years ago was normal    Mammogram- nothing to do after mastectomy  No respiratory or other symptoms   colonosc 2011  Lipids are fair on the zetia Lab Results  Component Value Date   CHOL 191 02/10/2011   CHOL 193 01/12/2010   CHOL 254* 02/11/2009   Lab Results  Component Value Date   HDL 58.20 02/10/2011   HDL 54.40 01/12/2010   HDL 40.98 02/11/2009   Lab Results  Component Value Date   LDLCALC 117* 02/10/2011   LDLCALC 126* 01/12/2010   LDLCALC 107* 04/22/2008   Lab Results  Component Value Date   TRIG 78.0 02/10/2011   TRIG 64.0 01/12/2010   TRIG 102.0 02/11/2009   Lab Results  Component Value Date   CHOLHDL 3 02/10/2011   CHOLHDL 4 01/12/2010   CHOLHDL 5 02/11/2009   Lab Results  Component Value Date   LDLDIRECT 184.7 02/11/2009   overall improved - zetia is working and also doing more exercise No red meat or fatty foods   Gyn care -- ? When - perhaps 3 years ago -- when she came to [place]  Has everything but ovaries  Will do pap this year  No hx of abn paps  No new partners   Patient Active Problem List  Diagnoses  . UNSPECIFIED VITAMIN D DEFICIENCY  . HYPERLIPIDEMIA  . GERD  . OSTEOARTHRITIS  . HYPERGLYCEMIA  . Gynecological examination   Past Medical History  Diagnosis Date  . GERD (gastroesophageal reflux disease)   . HLD (hyperlipidemia)    statin intolerant  . Osteoarthritis   . Sjogren's syndrome   . Breast cancer     DCIS; BRCA 1 pos HER2 pos, ER2 pos  . Urine incontinence   . DDD (degenerative disc disease)   . Reactive depression (situational)     very well controlled, and now takes prozac for sleep  . Vitamin D deficiency   . Hyperglycemia 9/09    A1C elvated to 6.2  . Osteopenia 2007    ? from xray with nl dexa   Past Surgical History  Procedure Date  . Tonsillectomy 1974  . Lumbar disc surgery 1991    Ruptured disk L5  . Mastectomy 2001    bilateral  . Bilateral oophorectomy 205  . Total knee arthroplasty 2006    bilateral  . Colonoscopy 5/07    normal; recheck 3 years  . Total hip arthroplasty 2011   History  Substance Use Topics  . Smoking status: Never Smoker   . Smokeless tobacco: Not on file  . Alcohol Use: No   Family History  Problem Relation Age of  Onset  . Arthritis      family hx  . Breast cancer      1st degree relative <50  . Diabetes      1st degree relative  . Hyperlipidemia      family hx  . Hypertension      family hx  . Ovarian cancer      family hx  . Prostate cancer      1st degree relative <50  . Stroke      family hx  . Dementia Mother   . Psoriasis Brother   . Other Daughter     BRCA gene  . Brain cancer Brother   . Colon cancer Brother     also has BRCA gene   Allergies  Allergen Reactions  . JXB:JYNWGNFAOZH+YQMVHQION+GEXBMWUXLK Acid+Aspartame     REACTION: n/v  . Statins     REACTION: joint pain   Current Outpatient Prescriptions on File Prior to Visit  Medication Sig Dispense Refill  . Ascorbic Acid (VITAMIN C) 1000 MG tablet Take 1,000 mg by mouth 2 (two) times daily.        . Calcium-Magnesium 250-125 MG TABS Take 1 tablet by mouth daily.        . Cholecalciferol (VITAMIN D3) 400 UNITS CAPS Take 400 Units by mouth daily.       . cycloSPORINE (RESTASIS) 0.05 % ophthalmic emulsion 1 drop as directed.        . ezetimibe (ZETIA) 10 MG tablet Take 1  tablet (10 mg total) by mouth daily.  90 tablet  3  . fish oil-omega-3 fatty acids 1000 MG capsule Take 1 g by mouth 2 (two) times daily.        . Flax OIL Take 1 capsule by mouth daily.       Marland Kitchen FLUoxetine (PROZAC) 20 MG capsule TAKE ONE CAPSULE BY MOUTH ONE TIME DAILY  30 capsule  10  . omeprazole (PRILOSEC) 20 MG capsule Take 20 mg by mouth daily.        Marland Kitchen oxybutynin (OXYTROL) 3.9 MG/24HR Place 1 patch onto the skin 2 (two) times a week.  8 patch  11  . pilocarpine (SALAGEN) 5 MG tablet Take 5 mg by mouth 3 (three) times daily.        . benzonatate (TESSALON PERLES) 100 MG capsule Take 1 capsule (100 mg total) by mouth 3 (three) times daily as needed for cough.  30 capsule  0      Review of Systems Review of Systems  Constitutional: Negative for fever, appetite change, fatigue and unexpected weight change.  Eyes: Negative for pain and visual disturbance.  Respiratory: Negative for cough and shortness of breath.   Cardiovascular: Negative for cp or palpitations    Gastrointestinal: Negative for nausea, diarrhea and constipation. (does take miralax for constipation and figuring out best schedule for that --daily is too much) Genitourinary: Negative for urgency and frequency.  Skin: Negative for pallor or rash   Neurological: Negative for weakness, light-headedness, numbness and headaches.  Hematological: Negative for adenopathy. Does not bruise/bleed easily.  Psychiatric/Behavioral: Negative for dysphoric mood. The patient is not nervous/anxious.          Objective:   Physical Exam  Constitutional: She appears well-developed and well-nourished. No distress.       overwt and well appearing   HENT:  Head: Normocephalic and atraumatic.  Right Ear: External ear normal.  Left Ear: External ear normal.  Nose: Nose normal.  Mouth/Throat: Oropharynx is clear  and moist.  Eyes: Conjunctivae and EOM are normal. Pupils are equal, round, and reactive to light. No scleral icterus.  Neck:  Normal range of motion. Neck supple. No JVD present. Carotid bruit is not present. No thyromegaly present.  Cardiovascular: Normal rate, regular rhythm, normal heart sounds and intact distal pulses.   Pulmonary/Chest: Effort normal and breath sounds normal. No respiratory distress. She has no wheezes.  Abdominal: Soft. Bowel sounds are normal. She exhibits no distension and no mass. There is no tenderness.  Genitourinary: Rectum normal, vagina normal and uterus normal. Rectal exam shows no external hemorrhoid. There is no rash or lesion on the right labia. There is no rash or lesion on the left labia. Uterus is not enlarged and not tender. Cervix exhibits no motion tenderness, no discharge and no friability. No erythema or bleeding around the vagina. No vaginal discharge found.       S/p mastectomy- nl chest wall and axillae bilaterally  o External genitalia - nl appearing , no erythema o Urethral meatus normal appearing , no mass or discharge o Urethra  Mobile and normal appearing  o Bladder nontender and empty on exam o Vagina nl mucosa / no discharge or bleeding  o Cervix  Nl appearing / posterior/ no friability or discharge o Uterus  Nl size/ nontender  o Adnexa/parametria  Surgically absent  o Anus and perineum normal appearing    Musculoskeletal: Normal range of motion. She exhibits no edema and no tenderness.  Lymphadenopathy:    She has no cervical adenopathy.  Neurological: She is alert. She has normal reflexes. No cranial nerve deficit. She exhibits normal muscle tone. Coordination normal.  Skin: Skin is warm and dry. No rash noted. No erythema. No pallor.       lentigos diffusely  Psychiatric: She has a normal mood and affect.       Cheerful and talkative           Assessment & Plan:

## 2011-02-14 NOTE — Assessment & Plan Note (Signed)
Doing well with current supplementation -- with level of 65  Disc imp of ca and D Per pt nl dexa within past 2 years- reassuring

## 2011-02-14 NOTE — Patient Instructions (Addendum)
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

## 2011-02-14 NOTE — Assessment & Plan Note (Signed)
Improved today with zetia and  Low sat fat diet  Disc goals for lipids and reasons to control them Rev labs with pt Rev low sat fat diet in detail

## 2011-02-14 NOTE — Assessment & Plan Note (Signed)
a1c is fairly stable as pt gets back into her healthy diet and exercise program  Working steadily on wt loss  Rev low glycemic diet

## 2011-02-14 NOTE — Assessment & Plan Note (Signed)
3 year exam and pap done  No problems or compliants  Is a breast cancer survivor (utd colonosc and does not need mammograms)

## 2011-02-23 ENCOUNTER — Encounter: Payer: Self-pay | Admitting: *Deleted

## 2011-03-11 ENCOUNTER — Other Ambulatory Visit: Payer: Self-pay | Admitting: *Deleted

## 2011-03-11 MED ORDER — FLUOXETINE HCL 20 MG PO CAPS: 20.0000 mg | ORAL_CAPSULE | Freq: Every day | ORAL | Status: DC

## 2011-03-11 MED ORDER — PILOCARPINE HCL 5 MG PO TABS: 5.0000 mg | ORAL_TABLET | Freq: Three times a day (TID) | ORAL | Status: DC

## 2011-03-11 NOTE — Telephone Encounter (Signed)
Will refill electronically  

## 2011-06-07 ENCOUNTER — Encounter: Payer: Self-pay | Admitting: Family Medicine

## 2011-06-07 ENCOUNTER — Ambulatory Visit (INDEPENDENT_AMBULATORY_CARE_PROVIDER_SITE_OTHER): Payer: Managed Care, Other (non HMO) | Admitting: Family Medicine

## 2011-06-07 VITALS — BP 140/88 | HR 64 | Temp 98.7°F | Ht 67.75 in | Wt 235.8 lb

## 2011-06-07 DIAGNOSIS — B029 Zoster without complications: Secondary | ICD-10-CM | POA: Diagnosis not present

## 2011-06-07 DIAGNOSIS — J029 Acute pharyngitis, unspecified: Secondary | ICD-10-CM | POA: Diagnosis not present

## 2011-06-07 LAB — POCT RAPID STREP A (OFFICE): Rapid Strep A Screen: NEGATIVE

## 2011-06-07 MED ORDER — ACYCLOVIR 800 MG PO TABS: 800.0000 mg | ORAL_TABLET | Freq: Every day | ORAL | Status: AC

## 2011-06-07 NOTE — Patient Instructions (Signed)
Strep test negative today. You have shingles. take antiviral.  Shingles Shingles is caused by the same virus that causes chickenpox (varicella zoster virus or VZV). Shingles often occurs many years or decades after having chickenpox. That is why it is more common in adults older than 50 years. The virus reactivates and breaks out as an infection in a nerve root. SYMPTOMS   The initial feeling (sensations) may be pain. This pain is usually described as:   Burning.   Stabbing.   Throbbing.   Tingling in the nerve root.   A red rash will follow in a couple days. The rash may occur in any area of the body and is usually on one side (unilateral) of the body in a band or belt-like pattern. The rash usually starts out as very small blisters (vesicles). They will dry up after 7 to 10 days. This is not usually a significant problem except for the pain it causes.   Long-lasting (chronic) pain is more likely in an elderly person. It can last months to years. This condition is called postherpetic neuralgia.  Shingles can be an extremely severe infection in someone with AIDS, a weakened immune system, or with forms of leukemia. It can also be severe if you are taking transplant medicines or other medicines that weaken the immune system. TREATMENT  Your caregiver will often treat you with:  Antiviral drugs.   Anti-inflammatory drugs.   Pain medicines.  Bed rest is very important in preventing the pain associated with herpes zoster (postherpetic neuralgia). Application of heat in the form of a hot water bottle or electric heating pad or gentle pressure with the hand is recommended to help with the pain or discomfort. PREVENTION  A varicella zoster vaccine is available to help protect against the virus. The Food and Drug Administration approved the varicella zoster vaccine for individuals 15 years of age and older. HOME CARE INSTRUCTIONS   Cool compresses to the area of rash may be helpful.    Only take over-the-counter or prescription medicines for pain, discomfort, or fever as directed by your caregiver.   Avoid contact with:   Babies.   Pregnant women.   Children with eczema.   Elderly people with transplants.   People with chronic illnesses, such as leukemia and AIDS.   If the area involved is on your face, you may receive a referral for follow-up to a specialist. It is very important to keep all follow-up appointments. This will help avoid eye complications, chronic pain, or disability.  SEEK IMMEDIATE MEDICAL CARE IF:   You develop any pain (headache) in the area of the face or eye. This must be followed carefully by your caregiver or ophthalmologist. An infection in part of your eye (cornea) can be very serious. It could lead to blindness.   You do not have pain relief from prescribed medicines.   Your redness or swelling spreads.   The area involved becomes very swollen and painful.   You have a fever.   You notice any red or painful lines extending away from the affected area toward your heart (lymphangitis).   Your condition is worsening or has changed.  Document Released: 01/24/2005 Document Revised: 01/13/2011 Document Reviewed: 12/29/2008 [facility] Patient Information 2012 [place], [place].

## 2011-06-07 NOTE — Assessment & Plan Note (Signed)
Outside of 72 hour window, however possible new lesions developing so will treat with acyclovir (pt prefers cheaper med). Update [place] if not improving as expected. rec use OTC NSAID, update if pain not controlled with this for stronger prescription analgesic.

## 2011-06-07 NOTE — Assessment & Plan Note (Signed)
Low centor criteria, RST neg. Anticipate viral, less likely allergic.  Supportive care, rec NSAIDs.

## 2011-06-07 NOTE — Progress Notes (Addendum)
  Subjective:    Patient ID: Lauren [name], female    DOB: 05-11-1945, 66 y.o.   MRN: [id number]  HPI CC: check back, ST  5d ago started having strange sensation on back, that developed into bumps, then burning pain and prickly tenderness.  + itchy.  Now tender over last 24 hours.  So far has tried steroid cream.  Did have zostavax 5 yrs ago.  Also with soreness in throat - exposure to strep (grandson) recently.  No congestion, coughing, fevers/chills.  Past Medical History  Diagnosis Date  . GERD (gastroesophageal reflux disease)   . HLD (hyperlipidemia)     statin intolerant  . Osteoarthritis   . Sjogren's syndrome   . Breast cancer     DCIS; BRCA 1 pos HER2 pos, ER2 pos  . Urine incontinence   . DDD (degenerative disc disease)   . Reactive depression (situational)     very well controlled, and now takes prozac for sleep  . Vitamin d deficiency   . Hyperglycemia 9/09    A1C elvated to 6.2  . Osteopenia 2007    ? from xray with nl dexa    Review of Systems Per HPI    Objective:   Physical Exam  Nursing note and vitals reviewed. Constitutional: She appears well-developed and well-nourished. No distress.  HENT:  Head: Normocephalic and atraumatic.  Right Ear: Hearing, tympanic membrane, external ear and ear canal normal.  Left Ear: Hearing, tympanic membrane, external ear and ear canal normal.  Nose: Nose normal. No mucosal edema or rhinorrhea. Right sinus exhibits no maxillary sinus tenderness and no frontal sinus tenderness. Left sinus exhibits no maxillary sinus tenderness and no frontal sinus tenderness.  Mouth/Throat: Uvula is midline and mucous membranes are normal. Posterior oropharyngeal erythema present. No oropharyngeal exudate, posterior oropharyngeal edema or tonsillar abscesses.  Eyes: Conjunctivae and EOM are normal. Pupils are equal, round, and reactive to light. No scleral icterus.  Neck: Normal range of motion. Neck supple.  Cardiovascular: Normal rate,  regular rhythm, normal heart sounds and intact distal pulses.   No murmur heard. Pulmonary/Chest: Effort normal and breath sounds normal. No respiratory distress. She has no wheezes. She has no rales.  Lymphadenopathy:    She has no cervical adenopathy.  Skin: Skin is warm and dry. Rash noted.       Left mid back with cluster of erythematous vesicles, some spreading laterally across dermatomal band.  Psychiatric: She has a normal mood and affect.   S/p bilateral mastectomy     Assessment & Plan:

## 2011-06-28 ENCOUNTER — Encounter: Payer: Self-pay | Admitting: Family Medicine

## 2011-06-28 ENCOUNTER — Ambulatory Visit (INDEPENDENT_AMBULATORY_CARE_PROVIDER_SITE_OTHER): Payer: Managed Care, Other (non HMO) | Admitting: Family Medicine

## 2011-06-28 VITALS — BP 112/70 | HR 73 | Temp 97.8°F | Ht 67.75 in | Wt 236.5 lb

## 2011-06-28 DIAGNOSIS — J209 Acute bronchitis, unspecified: Secondary | ICD-10-CM

## 2011-06-28 MED ORDER — AZITHROMYCIN 250 MG PO TABS: ORAL_TABLET | ORAL | Status: AC

## 2011-06-28 MED ORDER — PREDNISONE 10 MG PO TABS: ORAL_TABLET | ORAL | Status: DC

## 2011-06-28 MED ORDER — ALBUTEROL SULFATE HFA 108 (90 BASE) MCG/ACT IN AERS: 2.0000 | INHALATION_SPRAY | RESPIRATORY_TRACT | Status: DC | PRN

## 2011-06-28 NOTE — Progress Notes (Signed)
Subjective:    Patient ID: Lauren [name], female    DOB: August 17, 1945, 66 y.o.   MRN: [id number]  HPI Here for uri Started Thursday with ST - then worse Friday too  Is having nasal congestion and post nasal drip  Bad sinus pressure- getting better Now is in her chest  Some wheezing at night  Cough- yellow phlegm No fever     Just got over shingles, even though she had the shot - was not severe, however Rough year of being sick a lot  Patient Active Problem List  Diagnoses  . UNSPECIFIED VITAMIN D DEFICIENCY  . HYPERLIPIDEMIA  . GERD  . OSTEOARTHRITIS  . HYPERGLYCEMIA  . Gynecological examination  . Shingles  . Sore throat  . Bronchitis with bronchospasm   Past Medical History  Diagnosis Date  . GERD (gastroesophageal reflux disease)   . HLD (hyperlipidemia)     statin intolerant  . Osteoarthritis   . Sjogren's syndrome   . Breast cancer     DCIS; BRCA 1 pos HER2 pos, ER2 pos  . Urine incontinence   . DDD (degenerative disc disease)   . Reactive depression (situational)     very well controlled, and now takes prozac for sleep  . Vitamin d deficiency   . Hyperglycemia 9/09    A1C elvated to 6.2  . Osteopenia 2007    ? from xray with nl dexa   Past Surgical History  Procedure Date  . Tonsillectomy 1974  . Lumbar disc surgery 1991    Ruptured disk L5  . Mastectomy 2001    bilateral  . Bilateral oophorectomy 205  . Total knee arthroplasty 2006    bilateral  . Colonoscopy 5/07    normal; recheck 3 years  . Total hip arthroplasty 2011   History  Substance Use Topics  . Smoking status: Never Smoker   . Smokeless tobacco: Never Used  . Alcohol Use: Yes     occassional wine   Family History  Problem Relation Age of Onset  . Arthritis      family hx  . Breast cancer      1st degree relative <50  . Diabetes      1st degree relative  . Hyperlipidemia      family hx  . Hypertension      family hx  . Ovarian cancer      family hx  . Prostate cancer       1st degree relative <50  . Stroke      family hx  . Dementia Mother   . Psoriasis Brother   . Other Daughter     BRCA gene  . Brain cancer Brother   . Colon cancer Brother     also has BRCA gene   Allergies  Allergen Reactions  . Amoxicillin-Pot Clavulanate     REACTION: n/v  . Statins     REACTION: joint pain   Current Outpatient Prescriptions on File Prior to Visit  Medication Sig Dispense Refill  . Ascorbic Acid (VITAMIN C) 1000 MG tablet Take 1,000 mg by mouth 2 (two) times daily.        . Calcium-Magnesium 250-125 MG TABS Take 1 tablet by mouth daily.        . Cholecalciferol (VITAMIN D3) 400 UNITS CAPS Take 400 Units by mouth daily.       . cycloSPORINE (RESTASIS) 0.05 % ophthalmic emulsion 1 drop as directed.        . ezetimibe (ZETIA) 10  MG tablet Take 1 tablet (10 mg total) by mouth daily.  90 tablet  3  . fish oil-omega-3 fatty acids 1000 MG capsule Take 1 g by mouth 2 (two) times daily.        Marland Kitchen FLUoxetine (PROZAC) 20 MG capsule Take 1 capsule (20 mg total) by mouth daily.  90 capsule  3  . omeprazole (PRILOSEC) 20 MG capsule Take 20 mg by mouth daily.        Marland Kitchen oxybutynin (OXYTROL) 3.9 MG/24HR Place 1 patch onto the skin 2 (two) times a week.  8 patch  11  . pilocarpine (SALAGEN) 5 MG tablet Take 1 tablet (5 mg total) by mouth 3 (three) times daily.  270 tablet  3  . albuterol (PROVENTIL HFA;VENTOLIN HFA) 108 (90 BASE) MCG/ACT inhaler Inhale 2 puffs into the lungs every 4 (four) hours as needed for wheezing.  1 Inhaler  1      Review of Systems Review of Systems  Constitutional: Negative for fever, appetite change, and unexpected weight change. pos for malaise  Eyes: Negative for pain and visual disturbance.  ENT pos for runny/ stuffy nose and st  Respiratory: Negative for sob or stridor Cardiovascular: Negative for cp or palpitations    Gastrointestinal: Negative for nausea, diarrhea and constipation.  Genitourinary: Negative for urgency and frequency.    Skin: Negative for pallor or rash   Neurological: Negative for weakness, light-headedness, numbness and headaches.  Hematological: Negative for adenopathy. Does not bruise/bleed easily.  Psychiatric/Behavioral: Negative for dysphoric mood. The patient is not nervous/anxious.         Objective:   Physical Exam  Constitutional: She appears well-nourished. No distress.       Obese and well app  HENT:  Head: Normocephalic and atraumatic.  Right Ear: External ear normal.  Left Ear: External ear normal.  Mouth/Throat: Oropharynx is clear and moist. No oropharyngeal exudate.       Nares are injected and congested   No sinus tenderness TMs dull Clear post nasal drip  Eyes: Conjunctivae and EOM are normal. Pupils are equal, round, and reactive to light. Right eye exhibits no discharge. Left eye exhibits no discharge.  Neck: Normal range of motion. Neck supple. No JVD present. No thyromegaly present.  Cardiovascular: Normal rate and regular rhythm.   Pulmonary/Chest: Effort normal. No respiratory distress. She has wheezes. She has no rales. She exhibits no tenderness.       Diffuse exp wheezes  Fair air exch/ no prolonged exp phase  Harsh cough    Lymphadenopathy:    She has no cervical adenopathy.  Neurological: She is alert.  Skin: Skin is warm and dry. No rash noted.  Psychiatric: She has a normal mood and affect.          Assessment & Plan:

## 2011-06-28 NOTE — Assessment & Plan Note (Signed)
Will cover with zpk and also pred taper 30 mg  Disc use of albuterol hfa inhaler  Disc symptomatic care - see instructions on AVS  Update if not starting to improve in a week or if worsening

## 2011-06-28 NOTE — Patient Instructions (Signed)
Drink lots of water and rest  Take prednisone and zithromax as directed  Use inhaler for wheezing if needed  Update if not starting to improve in a week or if worsening   mucinex is fine

## 2011-08-22 ENCOUNTER — Encounter: Payer: Self-pay | Admitting: Family Medicine

## 2011-08-22 ENCOUNTER — Ambulatory Visit (INDEPENDENT_AMBULATORY_CARE_PROVIDER_SITE_OTHER): Payer: Managed Care, Other (non HMO) | Admitting: Family Medicine

## 2011-08-22 ENCOUNTER — Telehealth: Payer: Self-pay | Admitting: Oncology

## 2011-08-22 ENCOUNTER — Ambulatory Visit (INDEPENDENT_AMBULATORY_CARE_PROVIDER_SITE_OTHER)
Admission: RE | Admit: 2011-08-22 | Discharge: 2011-08-22 | Disposition: A | Discharge status: Home / Self Care | Payer: Managed Care, Other (non HMO) | Source: Ambulatory Visit | Attending: Family Medicine | Admitting: Family Medicine

## 2011-08-22 VITALS — BP 139/83 | HR 68 | Temp 97.9°F | Ht 68.0 in | Wt 239.0 lb

## 2011-08-22 DIAGNOSIS — Z853 Personal history of malignant neoplasm of breast: Secondary | ICD-10-CM | POA: Insufficient documentation

## 2011-08-22 DIAGNOSIS — J209 Acute bronchitis, unspecified: Secondary | ICD-10-CM | POA: Diagnosis not present

## 2011-08-22 DIAGNOSIS — R05 Cough: Secondary | ICD-10-CM | POA: Diagnosis not present

## 2011-08-22 DIAGNOSIS — R059 Cough, unspecified: Secondary | ICD-10-CM | POA: Diagnosis not present

## 2011-08-22 IMAGING — CR DG CHEST 2V
2 series · 2 of 2 positions shown · non-contrast
Comparison: None.

CLINICAL DATA: Cough.

CHEST - 2 VIEW

[view not recorded (1 of 2)]
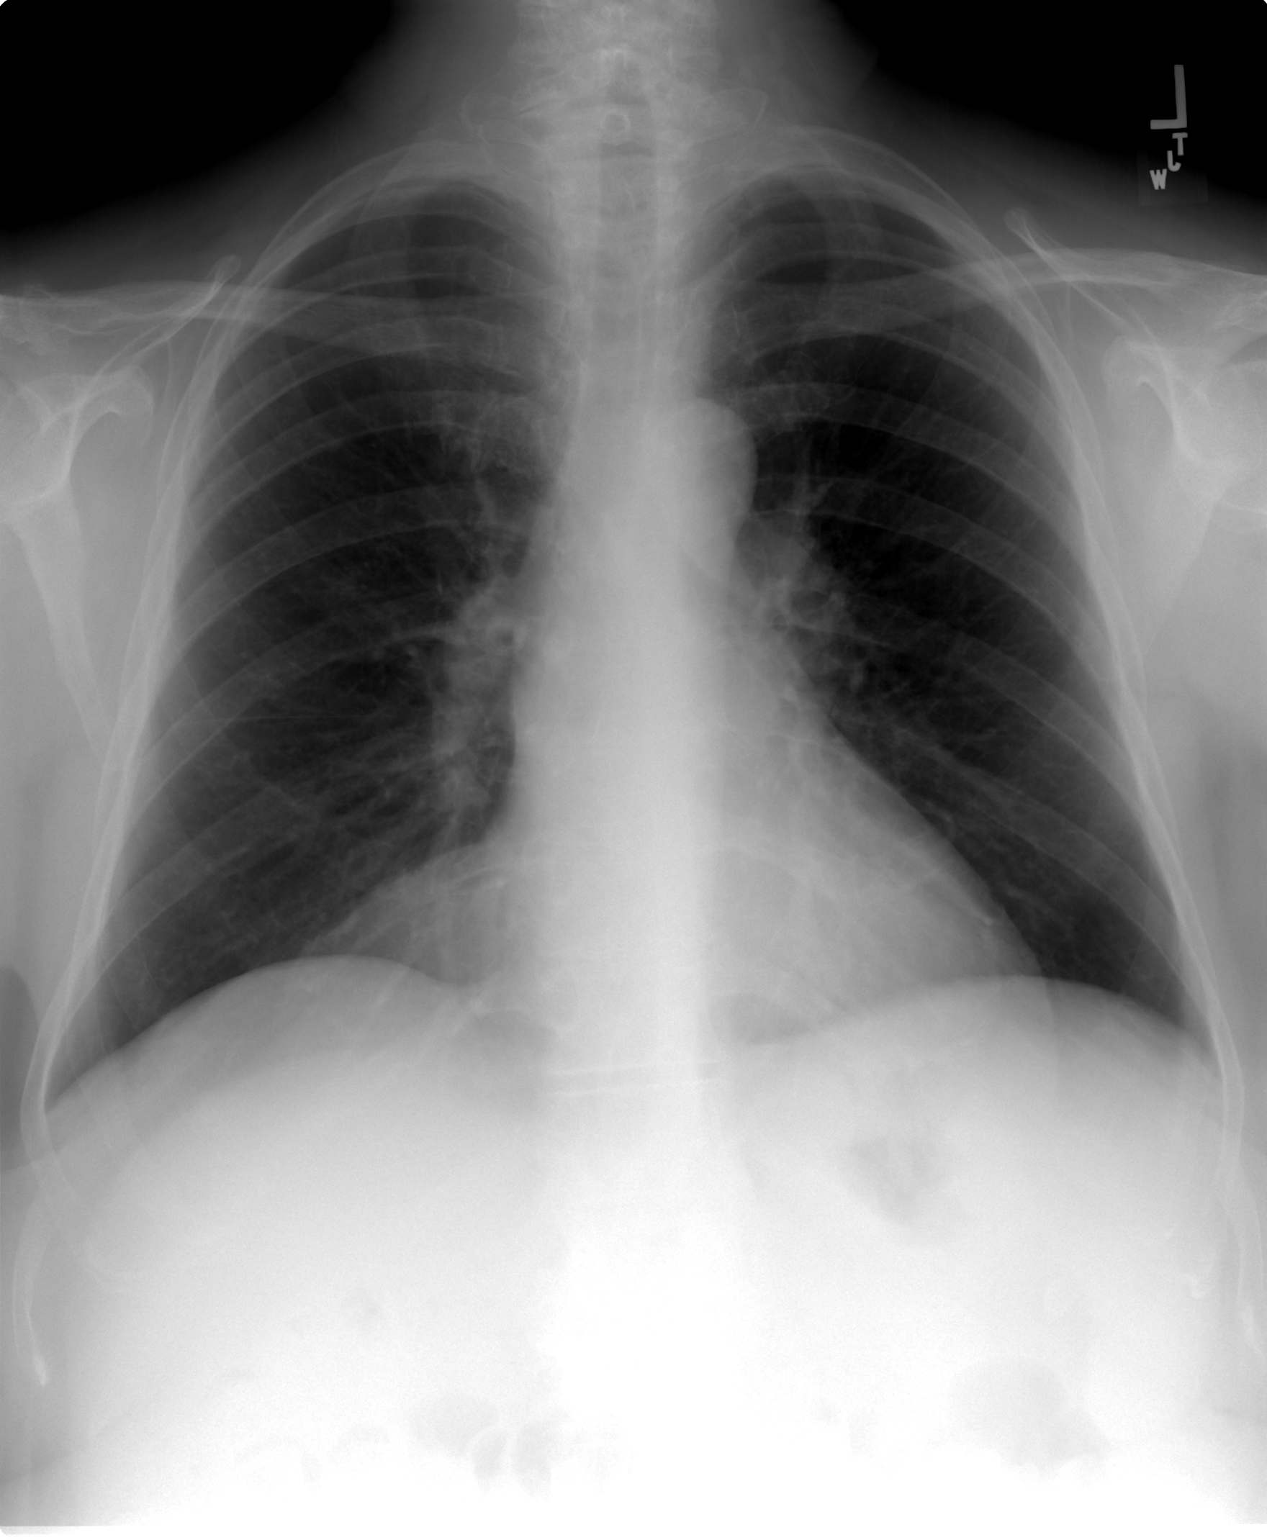

[view not recorded (2 of 2)]
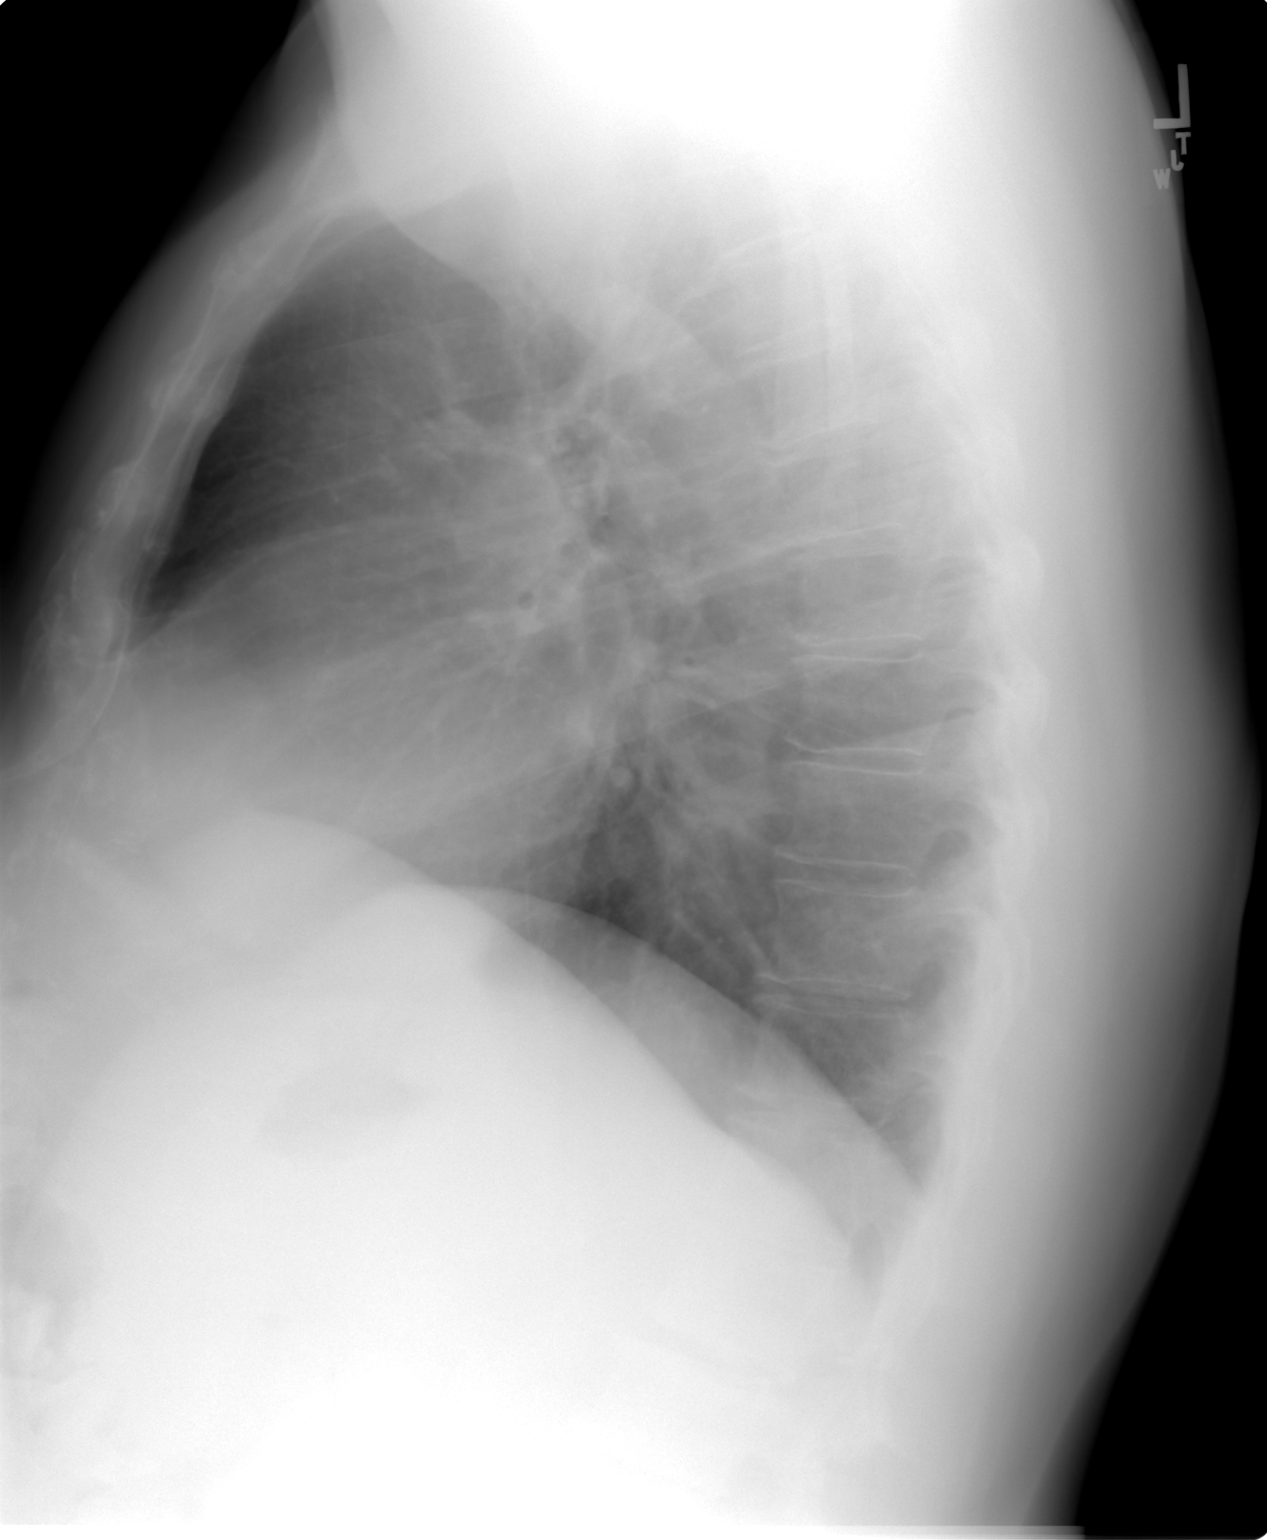

[2 of 2 positions shown; findings below may reference images not displayed]

FINDINGS: Density in the right cardiophrenic angle likely
represents prominent epicardial fat pad.  Heart is upper limits
normal in size.  Lungs are clear.  No effusions.  No acute bony
abnormality.
IMPRESSION: No active cardiopulmonary disease.

## 2011-08-22 NOTE — Assessment & Plan Note (Signed)
Pt still has slight cough with am prod since the last bronchitis - mild  ? If could be post nasal drip (corresp with stopping her claritin)- will try re starting it  cxr today and update

## 2011-08-22 NOTE — Patient Instructions (Addendum)
We will do referral to Dr Park Breed for follow up for breast cancer at check out  We will do cxr today  Try claritin 10 mg otc daily for post nasal drip and mucous

## 2011-08-22 NOTE — Assessment & Plan Note (Signed)
Pt needs to go back for f/u with Dr Park Breed Has BRCA 1 gene and family has been tested  Will ref for that appt  Overall doing well except for some fatigue

## 2011-08-22 NOTE — Telephone Encounter (Signed)
This pt called to set up an appt with dr Rogelia Boga. She is a pt of [facility]

## 2011-08-22 NOTE — Progress Notes (Signed)
Subjective:    Patient ID: Lauren [name], female    DOB: 1945/05/20, 66 y.o.   MRN: [id number]  HPI Cannot get over a uri -- just cannot get her strength back 100% Had it in May Also had shingles even though she had the shot  Getting tired more easily than usual   Daughters are more upset than she is    Chemistry      Component Value Date/Time   NA 140 02/10/2011 0923   K 4.4 02/10/2011 0923   CL 105 02/10/2011 0923   CO2 27 02/10/2011 0923   BUN 20 02/10/2011 0923   CREATININE 0.5 02/10/2011 0923      Component Value Date/Time   CALCIUM 9.2 02/10/2011 0923   ALKPHOS 79 02/10/2011 0923   AST 19 02/10/2011 0923   ALT 15 02/10/2011 0923   BILITOT 0.4 02/10/2011 0923      Lab Results  Component Value Date   WBC 7.9 02/10/2011   HGB 14.3 02/10/2011   HCT 43.0 02/10/2011   MCV 89.6 02/10/2011   PLT 218.0 02/10/2011   Lab Results  Component Value Date   TSH 1.05 02/10/2011    No new stressors Never been happier in her life      Is gaining weight even though exercising more in the pool , and also plays a lot with her grandkids    Has BRACA 1 gene and wants oncology follow up  Used to see Dr Park Breed  Wants to get her set her up for an appt   Patient Active Problem List  Diagnosis  . UNSPECIFIED VITAMIN D DEFICIENCY  . HYPERLIPIDEMIA  . GERD  . OSTEOARTHRITIS  . HYPERGLYCEMIA  . Gynecological examination  . Shingles  . Sore throat  . Bronchitis with bronchospasm  . Hx of breast cancer   Past Medical History  Diagnosis Date  . GERD (gastroesophageal reflux disease)   . HLD (hyperlipidemia)     statin intolerant  . Osteoarthritis   . Sjogren's syndrome   . Breast cancer     DCIS; BRCA 1 pos HER2 pos, ER2 pos  . Urine incontinence   . DDD (degenerative disc disease)   . Reactive depression (situational)     very well controlled, and now takes prozac for sleep  . Vitamin d deficiency   . Hyperglycemia 9/09    A1C elvated to 6.2  . Osteopenia 2007    ? from xray with nl dexa   Past  Surgical History  Procedure Date  . Tonsillectomy 1974  . Lumbar disc surgery 1991    Ruptured disk L5  . Mastectomy 2001    bilateral  . Bilateral oophorectomy 205  . Total knee arthroplasty 2006    bilateral  . Colonoscopy 5/07    normal; recheck 3 years  . Total hip arthroplasty 2011   History  Substance Use Topics  . Smoking status: Never Smoker   . Smokeless tobacco: Never Used  . Alcohol Use: Yes     occassional wine   Family History  Problem Relation Age of Onset  . Arthritis      family hx  . Breast cancer      1st degree relative <50  . Diabetes      1st degree relative  . Hyperlipidemia      family hx  . Hypertension      family hx  . Ovarian cancer      family hx  . Prostate cancer  1st degree relative <50  . Stroke      family hx  . Dementia Mother   . Psoriasis Brother   . Other Daughter     BRCA gene  . Brain cancer Brother   . Colon cancer Brother     also has BRCA gene   Allergies  Allergen Reactions  . Amoxicillin-Pot Clavulanate     REACTION: n/v  . Statins     REACTION: joint pain   Current Outpatient Prescriptions on File Prior to Visit  Medication Sig Dispense Refill  . Ascorbic Acid (VITAMIN C) 1000 MG tablet Take 1,000 mg by mouth 2 (two) times daily.        . Calcium-Magnesium 250-125 MG TABS Take 1 tablet by mouth daily.        . Cholecalciferol (VITAMIN D3) 400 UNITS CAPS Take 400 Units by mouth daily.       . cycloSPORINE (RESTASIS) 0.05 % ophthalmic emulsion 1 drop as directed.        . ezetimibe (ZETIA) 10 MG tablet Take 1 tablet (10 mg total) by mouth daily.  90 tablet  3  . fish oil-omega-3 fatty acids 1000 MG capsule Take 1 g by mouth 2 (two) times daily.        Marland Kitchen FLUoxetine (PROZAC) 20 MG capsule Take 1 capsule (20 mg total) by mouth daily.  90 capsule  3  . omeprazole (PRILOSEC) 20 MG capsule Take 20 mg by mouth daily.        Marland Kitchen oxybutynin (OXYTROL) 3.9 MG/24HR Place 1 patch onto the skin 2 (two) times a week.  8  patch  11  . pilocarpine (SALAGEN) 5 MG tablet Take 1 tablet (5 mg total) by mouth 3 (three) times daily.  270 tablet  3  . albuterol (PROVENTIL HFA;VENTOLIN HFA) 108 (90 BASE) MCG/ACT inhaler Inhale 2 puffs into the lungs every 4 (four) hours as needed for wheezing.  1 Inhaler  1     Review of Systems Review of Systems  Constitutional: Negative for fever, appetite change, fatigue and unexpected weight change.  Eyes: Negative for pain and visual disturbance.  ENT pos for post nasal drip Respiratory: pos for mild cough, neg for sob or wheeze Cardiovascular: Negative for cp or palpitations    Gastrointestinal: Negative for nausea, diarrhea and constipation.  Genitourinary: Negative for urgency and frequency.  Skin: Negative for pallor or rash   Neurological: Negative for weakness, light-headedness, numbness and headaches.  Hematological: Negative for adenopathy. Does not bruise/bleed easily.  Psychiatric/Behavioral: Negative for dysphoric mood. The patient is not nervous/anxious.         Objective:   Physical Exam  Constitutional: She appears well-developed and well-nourished. No distress.       Obese and well appearing   HENT:  Head: Normocephalic and atraumatic.  Right Ear: External ear normal.  Left Ear: External ear normal.  Mouth/Throat: Oropharynx is clear and moist.       Nares are injected and congested  No sinus tenderness Clear post nasal drip  Eyes: Conjunctivae and EOM are normal. Pupils are equal, round, and reactive to light. Right eye exhibits no discharge. Left eye exhibits no discharge.  Neck: Normal range of motion. Neck supple. No JVD present. Carotid bruit is not present. No thyromegaly present.  Cardiovascular: Normal rate, regular rhythm, normal heart sounds and intact distal pulses.  Exam reveals no gallop.   Pulmonary/Chest: Effort normal and breath sounds normal. No respiratory distress. She has no wheezes.  Abdominal:  Soft. Bowel sounds are normal. She  exhibits no distension, no abdominal bruit and no mass. There is no tenderness.  Musculoskeletal: She exhibits no edema and no tenderness.  Lymphadenopathy:    She has no cervical adenopathy.  Neurological: She is alert. No cranial nerve deficit. She exhibits normal muscle tone. Coordination normal.  Skin: Skin is warm and dry. No rash noted. No erythema. No pallor.  Psychiatric: She has a normal mood and affect.          Assessment & Plan:

## 2011-08-24 ENCOUNTER — Telehealth: Payer: Self-pay | Admitting: Oncology

## 2011-08-24 NOTE — Telephone Encounter (Signed)
lmonvm adviisng the pt of her sept appts

## 2011-10-05 DIAGNOSIS — H04129 Dry eye syndrome of unspecified lacrimal gland: Secondary | ICD-10-CM | POA: Diagnosis not present

## 2011-10-05 DIAGNOSIS — D3130 Benign neoplasm of unspecified choroid: Secondary | ICD-10-CM | POA: Diagnosis not present

## 2011-10-05 DIAGNOSIS — H1045 Other chronic allergic conjunctivitis: Secondary | ICD-10-CM | POA: Diagnosis not present

## 2011-10-25 ENCOUNTER — Other Ambulatory Visit: Payer: Self-pay | Admitting: *Deleted

## 2011-10-25 DIAGNOSIS — Z853 Personal history of malignant neoplasm of breast: Secondary | ICD-10-CM

## 2011-10-26 ENCOUNTER — Telehealth: Payer: Self-pay | Admitting: *Deleted

## 2011-10-26 ENCOUNTER — Other Ambulatory Visit (HOSPITAL_BASED_OUTPATIENT_CLINIC_OR_DEPARTMENT_OTHER): Payer: Medicare Other | Admitting: Lab

## 2011-10-26 ENCOUNTER — Encounter: Payer: Self-pay | Admitting: Oncology

## 2011-10-26 ENCOUNTER — Ambulatory Visit (HOSPITAL_BASED_OUTPATIENT_CLINIC_OR_DEPARTMENT_OTHER): Payer: Managed Care, Other (non HMO) | Admitting: Oncology

## 2011-10-26 VITALS — BP 136/85 | HR 60 | Temp 97.7°F | Resp 20 | Ht 68.0 in | Wt 234.4 lb

## 2011-10-26 DIAGNOSIS — Z853 Personal history of malignant neoplasm of breast: Secondary | ICD-10-CM

## 2011-10-26 DIAGNOSIS — K219 Gastro-esophageal reflux disease without esophagitis: Secondary | ICD-10-CM | POA: Diagnosis not present

## 2011-10-26 DIAGNOSIS — IMO0002 Reserved for concepts with insufficient information to code with codable children: Secondary | ICD-10-CM

## 2011-10-26 DIAGNOSIS — Z1501 Genetic susceptibility to malignant neoplasm of breast: Secondary | ICD-10-CM | POA: Diagnosis not present

## 2011-10-26 LAB — COMPREHENSIVE METABOLIC PANEL (CC13)
ALT: 10 U/L (ref 0–55)
AST: 12 U/L (ref 5–34)
Albumin: 3.8 g/dL (ref 3.5–5.0)
Alkaline Phosphatase: 78 U/L (ref 40–150)
BUN: 15 mg/dL (ref 7.0–26.0)
CO2: 26 mEq/L (ref 22–29)
Calcium: 9.2 mg/dL (ref 8.4–10.4)
Chloride: 103 mEq/L (ref 98–107)
Creatinine: 0.7 mg/dL (ref 0.6–1.1)
Glucose: 103 mg/dl — ABNORMAL HIGH (ref 70–99)
Potassium: 4.1 mEq/L (ref 3.5–5.1)
Sodium: 139 mEq/L (ref 136–145)
Total Bilirubin: 0.3 mg/dL (ref 0.20–1.20)
Total Protein: 6.8 g/dL (ref 6.4–8.3)

## 2011-10-26 LAB — CBC WITH DIFFERENTIAL/PLATELET
BASO%: 0.6 % (ref 0.0–2.0)
Basophils Absolute: 0 10*3/uL (ref 0.0–0.1)
EOS%: 2.6 % (ref 0.0–7.0)
Eosinophils Absolute: 0.2 10*3/uL (ref 0.0–0.5)
HCT: 42.5 % (ref 34.8–46.6)
HGB: 14.1 g/dL (ref 11.6–15.9)
LYMPH%: 23.9 % (ref 14.0–49.7)
MCH: 29.4 pg (ref 25.1–34.0)
MCHC: 33.3 g/dL (ref 31.5–36.0)
MCV: 88.5 fL (ref 79.5–101.0)
MONO#: 0.5 10*3/uL (ref 0.1–0.9)
MONO%: 8.2 % (ref 0.0–14.0)
NEUT#: 3.9 10*3/uL (ref 1.5–6.5)
NEUT%: 64.7 % (ref 38.4–76.8)
Platelets: 182 10*3/uL (ref 145–400)
RBC: 4.8 10*6/uL (ref 3.70–5.45)
RDW: 14 % (ref 11.2–14.5)
WBC: 6.1 10*3/uL (ref 3.9–10.3)
lymph#: 1.4 10*3/uL (ref 0.9–3.3)

## 2011-10-26 NOTE — Patient Instructions (Addendum)
Doing well   I will continue to see you on a yearly basis

## 2011-10-26 NOTE — Progress Notes (Signed)
OFFICE PROGRESS NOTE  CC  Roxy Manns, MD [address] [place] [address], [place] [address]  DIAGNOSIS: 66 year old female with high risk breast cancer diagnosed in 2008 66.  PRIOR THERAPY:  #1Patient underwent bilateral mastectomies for a stage I left breast cancer. Tumor was ER positive PR positive HER-2/neu 2+. She subsequently went on to receive 4 cycles of Adriamycin and Cytoxan. She then received adjuvant antiestrogen therapy   #2 patient was found to be BRCA1 positive. She also had bilateral salpingo-oophorectomies performed.  CURRENT THERAPY:Observation  INTERVAL HISTORY: Aslin Farinas 66 y.o. female returns forFollowup visit today. She hasn't been seen by me since 2009. Overall she is doing well she denies any fevers chills night sweats headaches shortness of breath chest pains palpitations. Patient does have degenerative disc disease she does become achy. She also has gastroesophageal reflux disease. Clinically patient has no evidence of recurrent breast cancer.  MEDICAL HISTORY: Past Medical History  Diagnosis Date  . GERD (gastroesophageal reflux disease)   . HLD (hyperlipidemia)     statin intolerant  . Osteoarthritis   . Sjogren's syndrome   . Breast cancer     DCIS; BRCA 1 pos HER2 pos, ER2 pos  . Urine incontinence   . DDD (degenerative disc disease)   . Reactive depression (situational)     very well controlled, and now takes prozac for sleep  . Vitamin d deficiency   . Hyperglycemia 9/09    A1C elvated to 6.2  . Osteopenia 2007    ? from xray with nl dexa    ALLERGIES:  is allergic to statins.  MEDICATIONS:  Current Outpatient Prescriptions  Medication Sig Dispense Refill  . albuterol (PROVENTIL HFA;VENTOLIN HFA) 108 (90 BASE) MCG/ACT inhaler Inhale 2 puffs into the lungs every 4 (four) hours as needed for wheezing.  1 Inhaler  1  . Ascorbic Acid (VITAMIN C) 1000 MG tablet Take 1,000 mg by mouth 2 (two) times daily.        .  Calcium-Magnesium 250-125 MG TABS Take 1 tablet by mouth daily.        . Cholecalciferol (VITAMIN D3) 400 UNITS CAPS Take 400 Units by mouth daily.       . cycloSPORINE (RESTASIS) 0.05 % ophthalmic emulsion 1 drop as directed.        . ezetimibe (ZETIA) 10 MG tablet Take 1 tablet (10 mg total) by mouth daily.  90 tablet  3  . fish oil-omega-3 fatty acids 1000 MG capsule Take 1 g by mouth 2 (two) times daily.        Judie Petit Prim-Borage (FLAX OIL XTRA PO) Take 1 each by mouth.      Marland Kitchen FLUoxetine (PROZAC) 20 MG capsule Take 1 capsule (20 mg total) by mouth daily.  90 capsule  3  . omeprazole (PRILOSEC) 20 MG capsule Take 20 mg by mouth daily.        Marland Kitchen oxybutynin (OXYTROL) 3.9 MG/24HR Place 1 patch onto the skin 2 (two) times a week.  8 patch  11  . pilocarpine (SALAGEN) 5 MG tablet Take 1 tablet (5 mg total) by mouth 3 (three) times daily.  270 tablet  3  . Probiotic Product (PROBIOTIC DAILY PO) Take 1 each by mouth.        SURGICAL HISTORY:  Past Surgical History  Procedure Date  . Tonsillectomy 1974  . Lumbar disc surgery 1991    Ruptured disk L5  . Mastectomy 2001    bilateral  .  Bilateral oophorectomy 205  . Total knee arthroplasty 2006    bilateral  . Colonoscopy 5/07    normal; recheck 3 years  . Total hip arthroplasty 2011    REVIEW OF SYSTEMS:  Pertinent items are noted in HPI.   PHYSICAL EXAMINATION:  Well-developed female in no acute distress HEENT exam EOMI PERRLA sclerae anicteric no conjunctival pallor oral mucosa is moist neck is supple lungs are clear cardiovascular is regular rate rhythm no murmurs gallops or rubs abdomen is soft nontender nondistended bowel sounds are present no just M. Extremities no clubbing edema or cyanosis neuro patient's alert oriented otherwise nonfocal bilateral incisional mastectomy scars reveal no evidence of nodularity no tenderness. ECOG PERFORMANCE STATUS: 0 - Asymptomatic  Blood pressure 136/85, pulse 60, temperature 97.7 F (36.5  C), temperature source Oral, resp. rate 20, height 5\' 8"  (1.727 m), weight 234 lb 6.4 oz (106.323 kg).  LABORATORY DATA: Lab Results  Component Value Date   WBC 6.1 10/26/2011   HGB 14.1 10/26/2011   HCT 42.5 10/26/2011   MCV 88.5 10/26/2011   PLT 182 10/26/2011      Chemistry      Component Value Date/Time   NA 139 10/26/2011 0957   NA 140 02/10/2011 0923   K 4.1 10/26/2011 0957   K 4.4 02/10/2011 0923   CL 103 10/26/2011 0957   CL 105 02/10/2011 0923   CO2 26 10/26/2011 0957   CO2 27 02/10/2011 0923   BUN 15.0 10/26/2011 0957   BUN 20 02/10/2011 0923   CREATININE 0.7 10/26/2011 0957   CREATININE 0.5 02/10/2011 0923      Component Value Date/Time   CALCIUM 9.2 10/26/2011 0957   CALCIUM 9.2 02/10/2011 0923   ALKPHOS 78 10/26/2011 0957   ALKPHOS 79 02/10/2011 0923   AST 12 10/26/2011 0957   AST 19 02/10/2011 0923   ALT 10 10/26/2011 0957   ALT 15 02/10/2011 0923   BILITOT 0.30 10/26/2011 0957   BILITOT 0.4 02/10/2011 0923       RADIOGRAPHIC STUDIES:  No results found.  ASSESSMENT: 66 year old female with history of stage I breast cancer status post bilateral mastectomies. Patient also is a BRCA1 carrier. She has had bilateral salpingo-oophorectomies as well. Patient did receive chemotherapy for her stage I breast cancer consisting of Adriamycin and Cytoxan. She has no clinical evidence of recurrent disease. There is no role of staging studies.   PLAN: Patient will continue to followup with me on a yearly basis. All questions were answered today.   All questions were answered. The patient knows to call the clinic with any problems, questions or concerns. We can certainly see the patient much sooner if necessary.  I spent 15 minutes counseling the patient face to face. The total time spent in the appointment was 30 minutes.   Drue Second, MD Medical/Oncology [facility] [phone] (beeper) [phone] (Office)  10/26/2011, 11:54 AM

## 2011-10-26 NOTE — Telephone Encounter (Signed)
Gave patient appointment for 10-31-2012 starting at 8:30am

## 2011-11-08 DIAGNOSIS — Z23 Encounter for immunization: Secondary | ICD-10-CM | POA: Diagnosis not present

## 2011-11-11 ENCOUNTER — Other Ambulatory Visit: Payer: Self-pay | Admitting: Family Medicine

## 2011-11-11 NOTE — Telephone Encounter (Signed)
Ok to refill? Received faxed request and placed in your tray

## 2011-11-14 MED ORDER — OXYBUTYNIN 3.9 MG/24HR TD PTTW: 1.0000 | MEDICATED_PATCH | TRANSDERMAL | Status: DC

## 2011-11-14 MED ORDER — EZETIMIBE 10 MG PO TABS: 10.0000 mg | ORAL_TABLET | Freq: Every day | ORAL | Status: DC

## 2011-11-14 NOTE — Telephone Encounter (Signed)
Printed for fax- in IN box

## 2011-11-14 NOTE — Telephone Encounter (Signed)
Rx faxed to [facility]

## 2012-02-05 ENCOUNTER — Telehealth: Payer: Self-pay | Admitting: Family Medicine

## 2012-02-05 DIAGNOSIS — K219 Gastro-esophageal reflux disease without esophagitis: Secondary | ICD-10-CM

## 2012-02-05 DIAGNOSIS — R7309 Other abnormal glucose: Secondary | ICD-10-CM

## 2012-02-05 DIAGNOSIS — E559 Vitamin D deficiency, unspecified: Secondary | ICD-10-CM

## 2012-02-05 DIAGNOSIS — E785 Hyperlipidemia, unspecified: Secondary | ICD-10-CM

## 2012-02-05 NOTE — Telephone Encounter (Signed)
Message copied by [name] on Sun Feb 05, 2012  3:41 PM ------      Message from: Baldomero Lamy      Created: Thu Jan 26, 2012  1:58 PM      Regarding: Cpx labs 12/30 Mon       Please order  future cpx labs for pt's upcoming lab appt.      Thanks      Rodney Booze

## 2012-02-06 ENCOUNTER — Other Ambulatory Visit (INDEPENDENT_AMBULATORY_CARE_PROVIDER_SITE_OTHER): Payer: Managed Care, Other (non HMO)

## 2012-02-06 DIAGNOSIS — R7309 Other abnormal glucose: Secondary | ICD-10-CM

## 2012-02-06 DIAGNOSIS — E785 Hyperlipidemia, unspecified: Secondary | ICD-10-CM | POA: Diagnosis not present

## 2012-02-06 LAB — COMPREHENSIVE METABOLIC PANEL
ALT: 16 U/L (ref 0–35)
AST: 19 U/L (ref 0–37)
Albumin: 3.8 g/dL (ref 3.5–5.2)
Alkaline Phosphatase: 65 U/L (ref 39–117)
BUN: 16 mg/dL (ref 6–23)
CO2: 25 mEq/L (ref 19–32)
Calcium: 8.9 mg/dL (ref 8.4–10.5)
Chloride: 102 mEq/L (ref 96–112)
Creatinine, Ser: 0.6 mg/dL (ref 0.4–1.2)
GFR: 110.27 mL/min (ref >60.00)
Glucose, Bld: 103 mg/dL — ABNORMAL HIGH (ref 70–99)
Potassium: 4 mEq/L (ref 3.5–5.1)
Sodium: 136 mEq/L (ref 135–145)
Total Bilirubin: 0.5 mg/dL (ref 0.3–1.2)
Total Protein: 7.3 g/dL (ref 6.0–8.3)

## 2012-02-06 LAB — HEMOGLOBIN A1C: Hgb A1c MFr Bld: 6.1 % (ref 4.6–6.5)

## 2012-02-06 LAB — LIPID PANEL
Cholesterol: 200 mg/dL (ref 0–200)
HDL: 49.7 mg/dL (ref >39.00)
LDL Cholesterol: 129 mg/dL — ABNORMAL HIGH (ref 0–99)
Total CHOL/HDL Ratio: 4
Triglycerides: 108 mg/dL (ref 0.0–149.0)
VLDL: 21.6 mg/dL (ref 0.0–40.0)

## 2012-02-10 ENCOUNTER — Other Ambulatory Visit: Payer: Commercial Indemnity

## 2012-02-15 ENCOUNTER — Encounter: Payer: Self-pay | Admitting: Family Medicine

## 2012-02-15 ENCOUNTER — Ambulatory Visit (INDEPENDENT_AMBULATORY_CARE_PROVIDER_SITE_OTHER): Payer: Managed Care, Other (non HMO) | Admitting: Family Medicine

## 2012-02-15 VITALS — BP 126/86 | HR 72 | Temp 98.3°F | Ht 67.75 in | Wt 242.0 lb

## 2012-02-15 DIAGNOSIS — E785 Hyperlipidemia, unspecified: Secondary | ICD-10-CM

## 2012-02-15 DIAGNOSIS — E559 Vitamin D deficiency, unspecified: Secondary | ICD-10-CM | POA: Diagnosis not present

## 2012-02-15 DIAGNOSIS — K219 Gastro-esophageal reflux disease without esophagitis: Secondary | ICD-10-CM

## 2012-02-15 DIAGNOSIS — R7309 Other abnormal glucose: Secondary | ICD-10-CM | POA: Diagnosis not present

## 2012-02-15 MED ORDER — PILOCARPINE HCL 5 MG PO TABS: 5.0000 mg | ORAL_TABLET | Freq: Three times a day (TID) | ORAL | Status: DC

## 2012-02-15 MED ORDER — OXYBUTYNIN 3.9 MG/24HR TD PTTW: 1.0000 | MEDICATED_PATCH | TRANSDERMAL | Status: DC

## 2012-02-15 MED ORDER — EZETIMIBE 10 MG PO TABS: 10.0000 mg | ORAL_TABLET | Freq: Every day | ORAL | Status: DC

## 2012-02-15 NOTE — Patient Instructions (Addendum)
Avoid red meat/ fried foods/ egg yolks/ fatty breakfast meats/ butter, cheese and high fat dairy/ and shellfish  Work hard on healthy diet and exercise for weight loss  Follow up in 6 months  I'm glad you are doing well

## 2012-02-15 NOTE — Progress Notes (Signed)
Subjective:    Patient ID: Lauren [name], female    DOB: 08/11/1945, 67 y.o.   MRN: [id number]  HPI Here for check up of chronic medical conditions and to review health mt list   Feels good  No new issues except for obesity  Wt is up 8 lb with bmi of 37 Gained it over the holidays  Going to eat veggies and lean protien -and get rid of the junk food  Is not exercising yet  Has a bike in her bedroom - needs to use it    Hx of breast cancer  No news -had a good follow up with dr Park Breed- goes once per year   Flu vaccine- [facility] in sept  Her family has the flu   Pap 1/13 Normal  No symptoms or problems   colonosc 5/11   Hyperlipidemia On zetia Lab Results  Component Value Date   CHOL 200 02/06/2012   CHOL 191 02/10/2011   CHOL 193 01/12/2010   Lab Results  Component Value Date   HDL 49.70 02/06/2012   HDL 58.20 02/10/2011   HDL 54.40 01/12/2010   Lab Results  Component Value Date   LDLCALC 129* 02/06/2012   LDLCALC 117* 02/10/2011   LDLCALC 126* 01/12/2010   Lab Results  Component Value Date   TRIG 108.0 02/06/2012   TRIG 78.0 02/10/2011   TRIG 64.0 01/12/2010   Lab Results  Component Value Date   CHOLHDL 4 02/06/2012   CHOLHDL 3 02/10/2011   CHOLHDL 4 01/12/2010   Lab Results  Component Value Date   LDLDIRECT 184.7 02/11/2009   thinks better diet and exercise will take care of that   Hyperglycemia  Lab Results  Component Value Date   HGBA1C 6.1 02/06/2012   she eats way too much sugar  Her feet tingle if she eats too much sugar    Patient Active Problem List  Diagnosis  . UNSPECIFIED VITAMIN D DEFICIENCY  . HYPERLIPIDEMIA  . GERD  . OSTEOARTHRITIS  . HYPERGLYCEMIA  . Gynecological examination  . Shingles  . Sore throat  . Bronchitis with bronchospasm  . Hx of breast cancer   Past Medical History  Diagnosis Date  . GERD (gastroesophageal reflux disease)   . HLD (hyperlipidemia)     statin intolerant  . Osteoarthritis   . Sjogren's  syndrome   . Breast cancer     DCIS; BRCA 1 pos HER2 pos, ER2 pos  . Urine incontinence   . DDD (degenerative disc disease)   . Reactive depression (situational)     very well controlled, and now takes prozac for sleep  . Vitamin D deficiency   . Hyperglycemia 9/09    A1C elvated to 6.2  . Osteopenia 2007    ? from xray with nl dexa   Past Surgical History  Procedure Date  . Tonsillectomy 1974  . Lumbar disc surgery 1991    Ruptured disk L5  . Mastectomy 2001    bilateral  . Bilateral oophorectomy 205  . Total knee arthroplasty 2006    bilateral  . Colonoscopy 5/07    normal; recheck 3 years  . Total hip arthroplasty 2011   History  Substance Use Topics  . Smoking status: Never Smoker   . Smokeless tobacco: Never Used  . Alcohol Use: Yes     Comment: occassional wine   Family History  Problem Relation Age of Onset  . Arthritis      family hx  .  Breast cancer      1st degree relative <50  . Diabetes      1st degree relative  . Hyperlipidemia      family hx  . Hypertension      family hx  . Ovarian cancer      family hx  . Prostate cancer      1st degree relative <50  . Stroke      family hx  . Dementia Mother   . Psoriasis Brother   . Other Daughter     BRCA gene  . Brain cancer Brother   . Colon cancer Brother     also has BRCA gene   Allergies  Allergen Reactions  . Statins     REACTION: joint pain   Current Outpatient Prescriptions on File Prior to Visit  Medication Sig Dispense Refill  . Ascorbic Acid (VITAMIN C) 1000 MG tablet Take 1,000 mg by mouth 2 (two) times daily.        . Calcium-Magnesium 250-125 MG TABS Take 1 tablet by mouth daily.        . Cholecalciferol (VITAMIN D3) 400 UNITS CAPS Take 400 Units by mouth daily.       . cycloSPORINE (RESTASIS) 0.05 % ophthalmic emulsion 1 drop as directed.        . ezetimibe (ZETIA) 10 MG tablet Take 1 tablet (10 mg total) by mouth daily.  90 tablet  3  . fish oil-omega-3 fatty acids 1000 MG  capsule Take 1 g by mouth 2 (two) times daily.        Judie Petit Prim-Borage (FLAX OIL XTRA PO) Take 1 each by mouth.      Marland Kitchen FLUoxetine (PROZAC) 20 MG capsule Take 1 capsule (20 mg total) by mouth daily.  90 capsule  3  . omeprazole (PRILOSEC) 20 MG capsule Take 20 mg by mouth daily.        . Probiotic Product (PROBIOTIC DAILY PO) Take 1 each by mouth.      . [DISCONTINUED] oxybutynin (OXYTROL) 3.9 MG/24HR Place 1 patch onto the skin 2 (two) times a week.  24 patch  3    Review of SystemsReview of Systems  Constitutional: Negative for fever, appetite change, fatigue and unexpected weight change.  Eyes: Negative for pain and visual disturbance.  Respiratory: Negative for cough and shortness of breath.   Cardiovascular: Negative for cp or palpitations    Gastrointestinal: Negative for nausea, diarrhea and constipation.  Genitourinary: Negative for urgency and frequency.  Skin: Negative for pallor or rash   Neurological: Negative for weakness, light-headedness, numbness and headaches.  Hematological: Negative for adenopathy. Does not bruise/bleed easily.  Psychiatric/Behavioral: Negative for dysphoric mood. The patient is not nervous/anxious.         Objective:   Physical Exam  Constitutional: She appears well-developed and well-nourished. No distress.       obese and well appearing   HENT:  Head: Normocephalic and atraumatic.  Right Ear: External ear normal.  Left Ear: External ear normal.  Nose: Nose normal.  Mouth/Throat: Oropharynx is clear and moist.  Eyes: Conjunctivae normal and EOM are normal. Pupils are equal, round, and reactive to light. Right eye exhibits no discharge. Left eye exhibits no discharge. No scleral icterus.  Neck: Normal range of motion. Neck supple. No JVD present. Carotid bruit is not present. No thyromegaly present.  Cardiovascular: Normal rate, regular rhythm, normal heart sounds and intact distal pulses.  Exam reveals no gallop.   No murmur  heard. Pulmonary/Chest: Effort normal and breath sounds normal. No respiratory distress. She has no wheezes. She has no rales.  Abdominal: Soft. Bowel sounds are normal. She exhibits no distension, no abdominal bruit and no mass. There is no tenderness.  Genitourinary:       S/p double mastectomy No masses or abn of chest wall  Musculoskeletal: She exhibits no edema and no tenderness.  Lymphadenopathy:    She has no cervical adenopathy.  Neurological: She is alert. She has normal reflexes. No cranial nerve deficit. She exhibits normal muscle tone. Coordination normal.  Skin: Skin is warm and dry. No rash noted. No erythema. No pallor.  Psychiatric: She has a normal mood and affect.          Assessment & Plan:

## 2012-02-16 NOTE — Assessment & Plan Note (Signed)
D level is stable Disc imp to overall and bone health She gets bone density assessment through [facility]

## 2012-02-16 NOTE — Assessment & Plan Note (Signed)
Does well with prilosec No changes Disc diet and need for wt loss

## 2012-02-16 NOTE — Assessment & Plan Note (Signed)
Lab Results  Component Value Date   HGBA1C 6.1 02/06/2012    Disc imp of wt loss and low glycemic diet Will follow

## 2012-02-16 NOTE — Assessment & Plan Note (Signed)
Chol is up a bit  Disc los sat fat diet Continue zetia

## 2012-03-03 ENCOUNTER — Other Ambulatory Visit: Payer: Self-pay | Admitting: Family Medicine

## 2012-03-05 NOTE — Telephone Encounter (Signed)
Please refil for 12 months, thanks

## 2012-03-05 NOTE — Telephone Encounter (Signed)
done

## 2012-03-05 NOTE — Telephone Encounter (Signed)
Ok to refill 

## 2012-04-16 ENCOUNTER — Other Ambulatory Visit: Payer: Self-pay | Admitting: Family Medicine

## 2012-08-14 ENCOUNTER — Ambulatory Visit: Payer: Medicare Other | Admitting: Family Medicine

## 2012-08-20 ENCOUNTER — Ambulatory Visit (INDEPENDENT_AMBULATORY_CARE_PROVIDER_SITE_OTHER): Payer: Medicare Other | Admitting: Family Medicine

## 2012-08-20 ENCOUNTER — Encounter: Payer: Self-pay | Admitting: Family Medicine

## 2012-08-20 VITALS — BP 136/78 | HR 72 | Temp 98.4°F | Wt 237.5 lb

## 2012-08-20 DIAGNOSIS — E785 Hyperlipidemia, unspecified: Secondary | ICD-10-CM | POA: Diagnosis not present

## 2012-08-20 DIAGNOSIS — R7309 Other abnormal glucose: Secondary | ICD-10-CM

## 2012-08-20 LAB — LIPID PANEL
Cholesterol: 208 mg/dL — ABNORMAL HIGH (ref 0–200)
HDL: 52.3 mg/dL (ref >39.00)
Total CHOL/HDL Ratio: 4
Triglycerides: 121 mg/dL (ref 0.0–149.0)
VLDL: 24.2 mg/dL (ref 0.0–40.0)

## 2012-08-20 LAB — HEMOGLOBIN A1C: Hgb A1c MFr Bld: 6.2 % (ref 4.6–6.5)

## 2012-08-20 LAB — LDL CHOLESTEROL, DIRECT: Direct LDL: 148.9 mg/dL

## 2012-08-20 NOTE — Assessment & Plan Note (Signed)
A1c today- exp improvement with lower sugar diet and wt loss Enc further exercise and wt loss If under 6 will f/u 1 y for annual exam No symptoms

## 2012-08-20 NOTE — Patient Instructions (Addendum)
Keep up the good work with diet and exercise - and do try to reduce fats as well as sugars in diet  Lab today  Follow up in 1 year for annual exam with labs prior

## 2012-08-20 NOTE — Assessment & Plan Note (Signed)
Lab today Diet is improved On zetia-intol of statins Disc goals and what to watch in diet

## 2012-08-20 NOTE — Progress Notes (Signed)
Subjective:    Patient ID: Lauren [name], female    DOB: 30-Dec-1945, 67 y.o.   MRN: [id number]  HPI Here for f/u of chronic medical conditions  Is feeling great overall  No problems   Wt is down 5 lb with bmi of 36 She cut out sugar from diet "alltogether"  Was really hard at first  No added sugar or "white foods"  Hyperglycemia Lab Results  Component Value Date   HGBA1C 6.1 02/06/2012   thinks this should be down  Diet- much better  Exercise -- is going to to the pool twice per day and does water aerobics (using a belt)   Hyperlipidemia Lab Results  Component Value Date   CHOL 200 02/06/2012   HDL 49.70 02/06/2012   LDLCALC 129* 02/06/2012   LDLDIRECT 184.7 02/11/2009   TRIG 108.0 02/06/2012   CHOLHDL 4 02/06/2012   Is on zetia alone-intol of statin Diet - is fair / uses veg oil and olive oils- does fry squash and April    Needs px for new prosthesis - bilateral from her mastectomy  Patient Active Problem List   Diagnosis Date Noted  . Hx of breast cancer 08/22/2011  . Gynecological examination 02/14/2011  . OSTEOARTHRITIS 04/23/2008  . UNSPECIFIED VITAMIN D DEFICIENCY 04/22/2008  . HYPERLIPIDEMIA 04/22/2008  . GERD 04/22/2008  . HYPERGLYCEMIA 04/22/2008   Past Medical History  Diagnosis Date  . GERD (gastroesophageal reflux disease)   . HLD (hyperlipidemia)     statin intolerant  . Osteoarthritis   . Sjogren's syndrome   . Breast cancer     DCIS; BRCA 1 pos HER2 pos, ER2 pos  . Urine incontinence   . DDD (degenerative disc disease)   . Reactive depression (situational)     very well controlled, and now takes prozac for sleep  . Vitamin D deficiency   . Hyperglycemia 9/09    A1C elvated to 6.2  . Osteopenia 2007    ? from xray with nl dexa   Past Surgical History  Procedure Laterality Date  . Tonsillectomy  1974  . Lumbar disc surgery  1991    Ruptured disk L5  . Mastectomy  2001    bilateral  . Bilateral oophorectomy  205  . Total knee  arthroplasty  2006    bilateral  . Colonoscopy  5/07    normal; recheck 3 years  . Total hip arthroplasty  2011   History  Substance Use Topics  . Smoking status: Never Smoker   . Smokeless tobacco: Never Used  . Alcohol Use: Yes     Comment: occassional wine   Family History  Problem Relation Age of Onset  . Arthritis      family hx  . Breast cancer      1st degree relative <50  . Diabetes      1st degree relative  . Hyperlipidemia      family hx  . Hypertension      family hx  . Ovarian cancer      family hx  . Prostate cancer      1st degree relative <50  . Stroke      family hx  . Dementia Mother   . Psoriasis Brother   . Other Daughter     BRCA gene  . Brain cancer Brother   . Colon cancer Brother     also has BRCA gene   Allergies  Allergen Reactions  . Statins     REACTION: joint pain  Current Outpatient Prescriptions on File Prior to Visit  Medication Sig Dispense Refill  . Ascorbic Acid (VITAMIN C) 1000 MG tablet Take 1,000 mg by mouth 2 (two) times daily.        . Calcium-Magnesium 250-125 MG TABS Take 1 tablet by mouth daily.        . Cholecalciferol (VITAMIN D3) 400 UNITS CAPS Take 400 Units by mouth daily.       . cycloSPORINE (RESTASIS) 0.05 % ophthalmic emulsion 1 drop as directed.        . ezetimibe (ZETIA) 10 MG tablet Take 1 tablet (10 mg total) by mouth daily.  90 tablet  3  . fish oil-omega-3 fatty acids 1000 MG capsule Take 1 g by mouth 2 (two) times daily.        Judie Petit Prim-Borage (FLAX OIL XTRA PO) Take 1 each by mouth.      Marland Kitchen FLUoxetine (PROZAC) 20 MG capsule TAKE ONE CAPSULE BY MOUTH ONE TIME DAILY  90 capsule  3  . omeprazole (PRILOSEC) 20 MG capsule Take 20 mg by mouth daily.        Marland Kitchen oxybutynin (OXYTROL) 3.9 MG/24HR Place 1 patch onto the skin 2 (two) times a week.  24 patch  3  . pilocarpine (SALAGEN) 5 MG tablet Take 1 tablet (5 mg total) by mouth 3 (three) times daily.  270 tablet  3  . Probiotic Product (PROBIOTIC  DAILY PO) Take 1 each by mouth.      . [DISCONTINUED] oxybutynin (OXYTROL) 3.9 MG/24HR Place 1 patch onto the skin 2 (two) times a week.  24 patch  3   No current facility-administered medications on file prior to visit.    Review of Systems Review of Systems  Constitutional: Negative for fever, appetite change, fatigue and unexpected weight change.  Eyes: Negative for pain and visual disturbance.  Respiratory: Negative for cough and shortness of breath.   Cardiovascular: Negative for cp or palpitations    Gastrointestinal: Negative for nausea, diarrhea and constipation.  Genitourinary: Negative for urgency and frequency.  Skin: Negative for pallor or rash   Neurological: Negative for weakness, light-headedness, numbness and headaches.  Hematological: Negative for adenopathy. Does not bruise/bleed easily.  Psychiatric/Behavioral: Negative for dysphoric mood. The patient is not nervous/anxious.         Objective:   Physical Exam  Constitutional: She appears well-developed and well-nourished. No distress.  obese and well appearing   HENT:  Head: Normocephalic and atraumatic.  Mouth/Throat: Oropharynx is clear and moist.  Eyes: Conjunctivae and EOM are normal. Pupils are equal, round, and reactive to light. No scleral icterus.  Neck: Normal range of motion. Neck supple. No JVD present. Carotid bruit is not present. No thyromegaly present.  Cardiovascular: Normal rate, regular rhythm, normal heart sounds and intact distal pulses.  Exam reveals no gallop.   Pulmonary/Chest: Effort normal and breath sounds normal. No respiratory distress. She has no wheezes. She has no rales.  Abdominal: She exhibits no abdominal bruit.  Musculoskeletal: She exhibits no edema.  Lymphadenopathy:    She has no cervical adenopathy.  Neurological: She is alert. She has normal reflexes.  Skin: Skin is warm and dry. No rash noted. No erythema. No pallor.  Psychiatric: She has a normal mood and affect.           Assessment & Plan:

## 2012-08-21 ENCOUNTER — Encounter: Payer: Self-pay | Admitting: *Deleted

## 2012-10-29 DIAGNOSIS — Z23 Encounter for immunization: Secondary | ICD-10-CM | POA: Diagnosis not present

## 2012-10-31 ENCOUNTER — Telehealth: Payer: Self-pay | Admitting: *Deleted

## 2012-10-31 ENCOUNTER — Encounter: Payer: Self-pay | Admitting: Medical Oncology

## 2012-10-31 ENCOUNTER — Ambulatory Visit (HOSPITAL_BASED_OUTPATIENT_CLINIC_OR_DEPARTMENT_OTHER): Payer: Medicare Other | Admitting: Oncology

## 2012-10-31 ENCOUNTER — Encounter: Payer: Self-pay | Admitting: Oncology

## 2012-10-31 ENCOUNTER — Other Ambulatory Visit (HOSPITAL_BASED_OUTPATIENT_CLINIC_OR_DEPARTMENT_OTHER): Payer: Medicare Other | Admitting: Lab

## 2012-10-31 VITALS — BP 125/81 | HR 64 | Temp 98.1°F | Resp 20 | Ht 67.75 in | Wt 232.4 lb

## 2012-10-31 DIAGNOSIS — Z853 Personal history of malignant neoplasm of breast: Secondary | ICD-10-CM

## 2012-10-31 LAB — CBC WITH DIFFERENTIAL/PLATELET
BASO%: 0.6 % (ref 0.0–2.0)
Basophils Absolute: 0 10*3/uL (ref 0.0–0.1)
EOS%: 2.4 % (ref 0.0–7.0)
Eosinophils Absolute: 0.2 10*3/uL (ref 0.0–0.5)
HCT: 43.8 % (ref 34.8–46.6)
HGB: 14.7 g/dL (ref 11.6–15.9)
LYMPH%: 18.2 % (ref 14.0–49.7)
MCH: 29.2 pg (ref 25.1–34.0)
MCHC: 33.5 g/dL (ref 31.5–36.0)
MCV: 87.1 fL (ref 79.5–101.0)
MONO#: 0.6 10*3/uL (ref 0.1–0.9)
MONO%: 7.9 % (ref 0.0–14.0)
NEUT#: 5.2 10*3/uL (ref 1.5–6.5)
NEUT%: 70.9 % (ref 38.4–76.8)
Platelets: 202 10*3/uL (ref 145–400)
RBC: 5.02 10*6/uL (ref 3.70–5.45)
RDW: 14.2 % (ref 11.2–14.5)
WBC: 7.4 10*3/uL (ref 3.9–10.3)
lymph#: 1.3 10*3/uL (ref 0.9–3.3)

## 2012-10-31 LAB — COMPREHENSIVE METABOLIC PANEL (CC13)
ALT: 12 U/L (ref 0–55)
AST: 15 U/L (ref 5–34)
Albumin: 3.6 g/dL (ref 3.5–5.0)
Alkaline Phosphatase: 82 U/L (ref 40–150)
BUN: 12.1 mg/dL (ref 7.0–26.0)
CO2: 22 mEq/L (ref 22–29)
Calcium: 9.6 mg/dL (ref 8.4–10.4)
Chloride: 107 mEq/L (ref 98–109)
Creatinine: 0.7 mg/dL (ref 0.6–1.1)
Glucose: 89 mg/dl (ref 70–140)
Potassium: 4.3 mEq/L (ref 3.5–5.1)
Sodium: 140 mEq/L (ref 136–145)
Total Bilirubin: 0.37 mg/dL (ref 0.20–1.20)
Total Protein: 7.4 g/dL (ref 6.4–8.3)

## 2012-10-31 NOTE — Progress Notes (Signed)
OFFICE PROGRESS NOTE  CC  Roxy Manns, MD [address] [place] [address], [place] [address]  DIAGNOSIS: 67 year old female with high risk breast cancer diagnosed in 2008 11.  PRIOR THERAPY:  #1Patient underwent bilateral mastectomies for a stage I left breast cancer. Tumor was ER positive PR positive HER-2/neu 2+. She subsequently went on to receive 4 cycles of Adriamycin and Cytoxan. She then received adjuvant antiestrogen therapy   #2 patient was found to be BRCA1 positive. She also had bilateral salpingo-oophorectomies performed.  CURRENT THERAPY:Observation  INTERVAL HISTORY: Lauren [name] 67 y.o. female returns forFollowup visit today. She hasn't been seen by me since 2009. Overall she is doing well she denies any fevers chills night sweats headaches shortness of breath chest pains palpitations. Patient does have degenerative disc disease she does become achy. She also has gastroesophageal reflux disease. Clinically patient has no evidence of recurrent breast cancer.  MEDICAL HISTORY: Past Medical History  Diagnosis Date  . GERD (gastroesophageal reflux disease)   . HLD (hyperlipidemia)     statin intolerant  . Osteoarthritis   . Sjogren's syndrome   . Breast cancer     DCIS; BRCA 1 pos HER2 pos, ER2 pos  . Urine incontinence   . DDD (degenerative disc disease)   . Reactive depression (situational)     very well controlled, and now takes prozac for sleep  . Vitamin D deficiency   . Hyperglycemia 9/09    A1C elvated to 6.2  . Osteopenia 2007    ? from xray with nl dexa    ALLERGIES:  is allergic to statins.  MEDICATIONS:  Current Outpatient Prescriptions  Medication Sig Dispense Refill  . Ascorbic Acid (VITAMIN C) 1000 MG tablet Take 1,000 mg by mouth 2 (two) times daily.        . Calcium-Magnesium 250-125 MG TABS Take 1 tablet by mouth daily.        . Cholecalciferol (VITAMIN D3) 400 UNITS CAPS Take 400 Units by mouth daily.       .  cycloSPORINE (RESTASIS) 0.05 % ophthalmic emulsion 1 drop as directed.        . ezetimibe (ZETIA) 10 MG tablet Take 1 tablet (10 mg total) by mouth daily.  90 tablet  3  . fish oil-omega-3 fatty acids 1000 MG capsule Take 1 g by mouth 2 (two) times daily.        Judie Petit Prim-Borage (FLAX OIL XTRA PO) Take 1 each by mouth.      Marland Kitchen FLUoxetine (PROZAC) 20 MG capsule TAKE ONE CAPSULE BY MOUTH ONE TIME DAILY  90 capsule  3  . omeprazole (PRILOSEC) 20 MG capsule Take 20 mg by mouth daily.        Marland Kitchen oxybutynin (OXYTROL) 3.9 MG/24HR Place 1 patch onto the skin 2 (two) times a week.  24 patch  3  . pilocarpine (SALAGEN) 5 MG tablet Take 1 tablet (5 mg total) by mouth 3 (three) times daily.  270 tablet  3  . Probiotic Product (PROBIOTIC DAILY PO) Take 1 each by mouth.      . [DISCONTINUED] oxybutynin (OXYTROL) 3.9 MG/24HR Place 1 patch onto the skin 2 (two) times a week.  24 patch  3   No current facility-administered medications for this visit.    SURGICAL HISTORY:  Past Surgical History  Procedure Laterality Date  . Tonsillectomy  1974  . Lumbar disc surgery  1991    Ruptured disk L5  . Mastectomy  2001  bilateral  . Bilateral oophorectomy  205  . Total knee arthroplasty  2006    bilateral  . Colonoscopy  5/07    normal; recheck 3 years  . Total hip arthroplasty  2011    REVIEW OF SYSTEMS:  Pertinent items are noted in HPI.   PHYSICAL EXAMINATION:  Well-developed female in no acute distress HEENT exam EOMI PERRLA sclerae anicteric no conjunctival pallor oral mucosa is moist neck is supple lungs are clear cardiovascular is regular rate rhythm no murmurs gallops or rubs abdomen is soft nontender nondistended bowel sounds are present no just M. Extremities no clubbing edema or cyanosis neuro patient's alert oriented otherwise nonfocal bilateral incisional mastectomy scars reveal no evidence of nodularity no tenderness. ECOG PERFORMANCE STATUS: 0 - Asymptomatic  Blood pressure 125/81,  pulse 64, temperature 98.1 F (36.7 C), temperature source Oral, resp. rate 20, height 5' 7.75" (1.721 m), weight 232 lb 6.4 oz (105.416 kg).  LABORATORY DATA: Lab Results  Component Value Date   WBC 7.4 10/31/2012   HGB 14.7 10/31/2012   HCT 43.8 10/31/2012   MCV 87.1 10/31/2012   PLT 202 10/31/2012      Chemistry      Component Value Date/Time   NA 136 02/06/2012 1001   NA 139 10/26/2011 0957   K 4.0 02/06/2012 1001   K 4.1 10/26/2011 0957   CL 102 02/06/2012 1001   CL 103 10/26/2011 0957   CO2 25 02/06/2012 1001   CO2 26 10/26/2011 0957   BUN 16 02/06/2012 1001   BUN 15.0 10/26/2011 0957   CREATININE 0.6 02/06/2012 1001   CREATININE 0.7 10/26/2011 0957      Component Value Date/Time   CALCIUM 8.9 02/06/2012 1001   CALCIUM 9.2 10/26/2011 0957   ALKPHOS 65 02/06/2012 1001   ALKPHOS 78 10/26/2011 0957   AST 19 02/06/2012 1001   AST 12 10/26/2011 0957   ALT 16 02/06/2012 1001   ALT 10 10/26/2011 0957   BILITOT 0.5 02/06/2012 1001   BILITOT 0.30 10/26/2011 0957       RADIOGRAPHIC STUDIES:  No results found.  ASSESSMENT: 67 year old female with history of stage I breast cancer status post bilateral mastectomies. Patient also is a BRCA1 carrier. She has had bilateral salpingo-oophorectomies as well. Patient did receive chemotherapy for her stage I breast cancer consisting of Adriamycin and Cytoxan. She has no clinical evidence of recurrent disease. There is no role of staging studies.   PLAN: Patient will continue to followup with me on a yearly basis. All questions were answered today.   All questions were answered. The patient knows to call the clinic with any problems, questions or concerns. We can certainly see the patient much sooner if necessary.  I spent 15 minutes counseling the patient face to face. The total time spent in the appointment was 20 minutes.   Drue Second, MD Medical/Oncology [facility] [phone] (beeper) [phone]  (Office)  10/31/2012, 8:57 AM

## 2012-10-31 NOTE — Patient Instructions (Addendum)
Doing well  BRCA-1 and BRCA-2 BRCA-1 and BRCA-2 are 2 genes that are linked with hereditary breast and ovarian cancers. About 200,000 women are diagnosed with invasive breast cancer each year and about 23,000 with ovarian cancer (according to the American Cancer Society). Of these cancers, about 5% to 10% will be due to a mutation in one of the BRCA genes. Men can also inherit an increased risk of developing breast cancer, primarily from an alteration in the BRCA-2 gene.  Individuals with mutations in BRCA1 or BRCA2 have significantly elevated risks for breast cancer (up to 80% lifetime risk), ovarian cancer (up to 40% lifetime risk), bilateral breast cancer and other types of cancers. BRCA mutations are inherited and passed from generation to generation. One half of the time, they are passed from the father's side of the family.  The DNA in white blood cells is used to detect mutations in the BRCA genes. While the gene products (proteins) of the BRCA genes act only in breast and ovarian tissue, the genes are present in every cell of the body and blood is the most easily accessible source of that DNA. PREPARATION FOR TEST The test for BRCA mutations is done on a blood sample collected by needle from a vein in the arm. The test does not require surgical biopsy of breast or ovarian tissue.  NORMAL FINDINGS No genetic mutations. Ranges for normal findings may vary among different laboratories and hospitals. You should always check with your doctor after having lab work or other tests done to discuss the meaning of your test results and whether your values are considered within normal limits. MEANING OF TEST  Your caregiver will go over the test results with you and discuss the importance and meaning of your results, as well as treatment options and the need for additional tests if necessary. OBTAINING THE TEST RESULTS It is your responsibility to obtain your test results. Ask the lab or department  performing the test when and how you will get your results. OTHER THINGS TO KNOW Your test results may have implications for other family members. When one member of a family is tested for BRCA mutations, issues often arise about how or whether to share this information with other family members. Seek advice from a genetic counselor about communication of result with your family members.  Pre and post test consultation with a health care provider knowledgeable about genetic testing cannot be overemphasized.  There are many issues to be considered when preparing for a genetic test and upon learning the results, and a genetic counselor has the knowledge and experience to help you sort through them.  If the BRCA test is positive, the options include increased frequency of check-ups (e.g., mammography, blood tests for CA-125, or transvaginal ultrasonography); medications that could reduce risk (e.g., oral contraceptives or tamoxifen); or surgical removal of the ovaries or breasts. There are a number of variables involved and it is important to discuss your options with your doctor and genetic counselor. Research studies have reported that for every 1000 women negative for BRCA mutations, between 12 and 45 of them will develop breast cancer by age 72 and between 3 and 4 will develop ovarian cancer by age 23. The risk increases with age. The test can be ordered by a doctor, preferably by one who can also offer genetic counseling. The blood sample will be sent to a laboratory that specializes in BRCA testing. The American Society of Clinical Oncology and the [facility] Breast Cancer Coalition encourage  women seeking the test to participate in long-term outcome studies to help gather information on the effectiveness of different check-up and treatment options. Document Released: 02/18/2004 Document Revised: 04/18/2011 Document Reviewed: 12/31/2007 [facility] Patient Information 2014 [place], [place].

## 2012-10-31 NOTE — Telephone Encounter (Signed)
appts made and printed...td 

## 2013-05-03 ENCOUNTER — Other Ambulatory Visit: Payer: Self-pay | Admitting: Family Medicine

## 2013-05-03 NOTE — Telephone Encounter (Signed)
Please refill for 6 months 

## 2013-05-03 NOTE — Telephone Encounter (Signed)
Electronic refill request, please advise  

## 2013-06-28 ENCOUNTER — Other Ambulatory Visit: Payer: Self-pay | Admitting: Family Medicine

## 2013-08-19 ENCOUNTER — Other Ambulatory Visit: Payer: Medicare Other

## 2013-08-26 ENCOUNTER — Encounter: Payer: Medicare Other | Admitting: Family Medicine

## 2013-09-18 DIAGNOSIS — Z23 Encounter for immunization: Secondary | ICD-10-CM | POA: Diagnosis not present

## 2013-10-25 ENCOUNTER — Telehealth: Payer: Self-pay | Admitting: Adult Health

## 2013-10-31 ENCOUNTER — Ambulatory Visit: Payer: Medicare Other | Admitting: Oncology

## 2013-10-31 DIAGNOSIS — L089 Local infection of the skin and subcutaneous tissue, unspecified: Secondary | ICD-10-CM | POA: Diagnosis not present

## 2013-11-02 DIAGNOSIS — B029 Zoster without complications: Secondary | ICD-10-CM | POA: Diagnosis not present

## 2013-11-04 DIAGNOSIS — B029 Zoster without complications: Secondary | ICD-10-CM | POA: Diagnosis not present

## 2013-11-06 ENCOUNTER — Telehealth: Payer: Self-pay | Admitting: Adult Health

## 2013-11-08 ENCOUNTER — Ambulatory Visit: Payer: Medicare Other | Admitting: Adult Health

## 2013-11-08 DIAGNOSIS — B029 Zoster without complications: Secondary | ICD-10-CM | POA: Diagnosis not present

## 2013-11-15 ENCOUNTER — Encounter: Payer: Self-pay | Admitting: Gastroenterology

## 2013-11-25 ENCOUNTER — Telehealth: Payer: Self-pay | Admitting: Family Medicine

## 2013-11-25 DIAGNOSIS — E559 Vitamin D deficiency, unspecified: Secondary | ICD-10-CM

## 2013-11-25 DIAGNOSIS — R739 Hyperglycemia, unspecified: Secondary | ICD-10-CM

## 2013-11-25 DIAGNOSIS — K219 Gastro-esophageal reflux disease without esophagitis: Secondary | ICD-10-CM

## 2013-11-25 DIAGNOSIS — E785 Hyperlipidemia, unspecified: Secondary | ICD-10-CM

## 2013-11-25 NOTE — Telephone Encounter (Signed)
Message copied by Abner Greenspan on Mon Nov 25, 2013  9:29 PM ------      Message from: Ellamae Sia      Created: Fri Nov 22, 2013  3:39 PM      Regarding: Lab orders for Tuesday, 10.20.15       Patient is scheduled for CPX labs, please order future labs, Thanks , Terri       ------

## 2013-11-26 ENCOUNTER — Encounter: Payer: Self-pay | Admitting: Family Medicine

## 2013-11-26 ENCOUNTER — Other Ambulatory Visit (INDEPENDENT_AMBULATORY_CARE_PROVIDER_SITE_OTHER): Payer: Medicare Other

## 2013-11-26 ENCOUNTER — Ambulatory Visit (INDEPENDENT_AMBULATORY_CARE_PROVIDER_SITE_OTHER): Payer: Medicare Other | Admitting: Family Medicine

## 2013-11-26 VITALS — BP 142/96 | HR 66 | Temp 98.2°F | Ht 67.75 in | Wt 245.8 lb

## 2013-11-26 DIAGNOSIS — J069 Acute upper respiratory infection, unspecified: Secondary | ICD-10-CM | POA: Diagnosis not present

## 2013-11-26 DIAGNOSIS — E559 Vitamin D deficiency, unspecified: Secondary | ICD-10-CM | POA: Diagnosis not present

## 2013-11-26 DIAGNOSIS — K219 Gastro-esophageal reflux disease without esophagitis: Secondary | ICD-10-CM | POA: Diagnosis not present

## 2013-11-26 DIAGNOSIS — Z8619 Personal history of other infectious and parasitic diseases: Secondary | ICD-10-CM | POA: Diagnosis not present

## 2013-11-26 DIAGNOSIS — B9789 Other viral agents as the cause of diseases classified elsewhere: Principal | ICD-10-CM

## 2013-11-26 DIAGNOSIS — Z23 Encounter for immunization: Secondary | ICD-10-CM

## 2013-11-26 DIAGNOSIS — R739 Hyperglycemia, unspecified: Secondary | ICD-10-CM

## 2013-11-26 DIAGNOSIS — E785 Hyperlipidemia, unspecified: Secondary | ICD-10-CM | POA: Diagnosis not present

## 2013-11-26 LAB — CBC WITH DIFFERENTIAL/PLATELET
Basophils Absolute: 0 10*3/uL (ref 0.0–0.1)
Basophils Relative: 0.6 % (ref 0.0–3.0)
Eosinophils Absolute: 0.4 10*3/uL (ref 0.0–0.7)
Eosinophils Relative: 4.3 % (ref 0.0–5.0)
HCT: 45.2 % (ref 36.0–46.0)
Hemoglobin: 14.7 g/dL (ref 12.0–15.0)
Lymphocytes Relative: 19.2 % (ref 12.0–46.0)
Lymphs Abs: 1.6 10*3/uL (ref 0.7–4.0)
MCHC: 32.5 g/dL (ref 30.0–36.0)
MCV: 88.6 fl (ref 78.0–100.0)
Monocytes Absolute: 0.6 10*3/uL (ref 0.1–1.0)
Monocytes Relative: 6.5 % (ref 3.0–12.0)
Neutro Abs: 5.9 10*3/uL (ref 1.4–7.7)
Neutrophils Relative %: 69.4 % (ref 43.0–77.0)
Platelets: 226 10*3/uL (ref 150.0–400.0)
RBC: 5.1 Mil/uL (ref 3.87–5.11)
RDW: 14.8 % (ref 11.5–15.5)
WBC: 8.5 10*3/uL (ref 4.0–10.5)

## 2013-11-26 LAB — COMPREHENSIVE METABOLIC PANEL
ALT: 21 U/L (ref 0–35)
AST: 24 U/L (ref 0–37)
Albumin: 3.6 g/dL (ref 3.5–5.2)
Alkaline Phosphatase: 70 U/L (ref 39–117)
BUN: 11 mg/dL (ref 6–23)
CO2: 22 mEq/L (ref 19–32)
Calcium: 9.2 mg/dL (ref 8.4–10.5)
Chloride: 101 mEq/L (ref 96–112)
Creatinine, Ser: 0.7 mg/dL (ref 0.4–1.2)
GFR: 92.86 mL/min (ref >60.00)
Glucose, Bld: 96 mg/dL (ref 70–99)
Potassium: 4 mEq/L (ref 3.5–5.1)
Sodium: 138 mEq/L (ref 135–145)
Total Bilirubin: 0.6 mg/dL (ref 0.2–1.2)
Total Protein: 8 g/dL (ref 6.0–8.3)

## 2013-11-26 LAB — LIPID PANEL
Cholesterol: 190 mg/dL (ref 0–200)
HDL: 51.6 mg/dL (ref >39.00)
LDL Cholesterol: 121 mg/dL — ABNORMAL HIGH (ref 0–99)
NonHDL: 138.4
Total CHOL/HDL Ratio: 4
Triglycerides: 85 mg/dL (ref 0.0–149.0)
VLDL: 17 mg/dL (ref 0.0–40.0)

## 2013-11-26 LAB — TSH: TSH: 0.62 u[IU]/mL (ref 0.35–4.50)

## 2013-11-26 LAB — VITAMIN D 25 HYDROXY (VIT D DEFICIENCY, FRACTURES): VITD: 65.24 ng/mL (ref 30.00–100.00)

## 2013-11-26 LAB — HEMOGLOBIN A1C: Hgb A1c MFr Bld: 6.1 % (ref 4.6–6.5)

## 2013-11-26 MED ORDER — GUAIFENESIN-CODEINE 100-10 MG/5ML PO SYRP: 5.0000 mL | ORAL_SOLUTION | Freq: Four times a day (QID) | ORAL | Status: DC | PRN

## 2013-11-26 NOTE — Progress Notes (Signed)
Subjective:    Patient ID: Lauren [name], female    DOB: 01-04-1946, 68 y.o.   MRN: [id number]  HPI Just had shingles on face - did not affect her eye- got over that  Had her shingles vaccine    Started getting uri symptoms on Friday  Coughing with tightness - non productive , no wheeze  Nose is very congested/ ears are ok  Throat -hoarse but not sore  Congestion and sinus pressure and pain  Not getting a lot out of her nose - yellow   Taking mucinex DM over the counter and also some alka selzer plus cold medication    Due for her labs today for her PE   Patient Active Problem List   Diagnosis Date Noted  . Hx of breast cancer 08/22/2011  . Gynecological examination 02/14/2011  . OSTEOARTHRITIS 04/23/2008  . Vitamin D deficiency 04/22/2008  . Hyperlipidemia 04/22/2008  . GERD 04/22/2008  . Hyperglycemia 04/22/2008   Past Medical History  Diagnosis Date  . GERD (gastroesophageal reflux disease)   . HLD (hyperlipidemia)     statin intolerant  . Osteoarthritis   . Sjogren's syndrome   . Breast cancer     DCIS; BRCA 1 pos HER2 pos, ER2 pos  . Urine incontinence   . DDD (degenerative disc disease)   . Reactive depression (situational)     very well controlled, and now takes prozac for sleep  . Vitamin D deficiency   . Hyperglycemia 9/09    A1C elvated to 6.2  . Osteopenia 2007    ? from xray with nl dexa   Past Surgical History  Procedure Laterality Date  . Tonsillectomy  1974  . Lumbar disc surgery  1991    Ruptured disk L5  . Mastectomy  2001    bilateral  . Bilateral oophorectomy  205  . Total knee arthroplasty  2006    bilateral  . Colonoscopy  5/07    normal; recheck 3 years  . Total hip arthroplasty  2011   History  Substance Use Topics  . Smoking status: Never Smoker   . Smokeless tobacco: Never Used  . Alcohol Use: Yes     Comment: occassional wine   Family History  Problem Relation Age of Onset  . Arthritis      family hx  . Breast  cancer      1st degree relative <50  . Diabetes      1st degree relative  . Hyperlipidemia      family hx  . Hypertension      family hx  . Ovarian cancer      family hx  . Prostate cancer      1st degree relative <50  . Stroke      family hx  . Dementia Mother   . Psoriasis Brother   . Other Daughter     BRCA gene  . Brain cancer Brother   . Colon cancer Brother     also has BRCA gene   Allergies  Allergen Reactions  . Statins     REACTION: joint pain   Current Outpatient Prescriptions on File Prior to Visit  Medication Sig Dispense Refill  . Ascorbic Acid (VITAMIN C) 1000 MG tablet Take 1,000 mg by mouth 2 (two) times daily.        . Calcium-Magnesium 250-125 MG TABS Take 1 tablet by mouth daily.        . Cholecalciferol (VITAMIN D3) 400 UNITS CAPS Take 400  Units by mouth daily.       . cycloSPORINE (RESTASIS) 0.05 % ophthalmic emulsion 1 drop as directed.        . fish oil-omega-3 fatty acids 1000 MG capsule Take 1 g by mouth 2 (two) times daily.        Randell Loop Prim-Borage (FLAX OIL XTRA PO) Take 1 each by mouth.      Marland Kitchen FLUoxetine (PROZAC) 20 MG capsule Take one capsule by mouth one time daily  90 capsule  1  . omeprazole (PRILOSEC) 20 MG capsule Take 20 mg by mouth daily.        Marland Kitchen oxybutynin (OXYTROL) 3.9 MG/24HR Place 1 patch onto the skin 2 (two) times a week.  24 patch  3  . pilocarpine (SALAGEN) 5 MG tablet Take one tablet by mouth three times daily  270 tablet  1  . ZETIA 10 MG tablet Take one tablet by mouth one time daily  30 tablet  5  . [DISCONTINUED] oxybutynin (OXYTROL) 3.9 MG/24HR Place 1 patch onto the skin 2 (two) times a week.  24 patch  3   No current facility-administered medications on file prior to visit.      Review of Systems Review of Systems  Constitutional: Negative for fever, appetite change,  and unexpected weight change.  ENT pos for cong and rhinorrhea and sinus pressure/neg for purulent nasal d/c  Eyes: Negative for pain and  visual disturbance.  Respiratory: Negative for shortness of breath.   Cardiovascular: Negative for cp or palpitations    Gastrointestinal: Negative for nausea, diarrhea and constipation.  Genitourinary: Negative for urgency and frequency.  Skin: Negative for pallor or rash  (pos for recent facial shingles) Neurological: Negative for weakness, light-headedness, numbness and headaches.  Hematological: Negative for adenopathy. Does not bruise/bleed easily.  Psychiatric/Behavioral: Negative for dysphoric mood. The patient is not nervous/anxious.         Objective:   Physical Exam  Constitutional: She appears well-developed and well-nourished. No distress.  obese and well appearing   HENT:  Head: Normocephalic and atraumatic.  Right Ear: External ear normal.  Left Ear: External ear normal.  Mouth/Throat: Oropharynx is clear and moist.  Nares are injected and congested   Clear rhinorrhea   Eyes: Conjunctivae and EOM are normal. Pupils are equal, round, and reactive to light. No scleral icterus.  Neck: Normal range of motion. Neck supple.  Cardiovascular: Normal rate, regular rhythm and normal heart sounds.   Pulmonary/Chest: Effort normal and breath sounds normal. No respiratory distress. She has no wheezes. She has no rales.  CTA Good air exch  Lymphadenopathy:    She has no cervical adenopathy.  Skin: Skin is warm and dry. No rash noted.  Psychiatric: She has a normal mood and affect.          Assessment & Plan:   Problem List Items Addressed This Visit     Respiratory   Viral URI with cough - Primary     Disc symptomatic care - see instructions on AVS  Disc use of expectorant/steam / fluids and rest  Update if not starting to improve in a week or if worsening   Will watch for signs of sinusitis      Other Visit Diagnoses   Need for influenza vaccination        Relevant Orders       Flu Vaccine QUAD 36+ mos PF IM (Fluarix Quad PF) (Completed)

## 2013-11-26 NOTE — Progress Notes (Signed)
Pre visit review using our clinic review tool, if applicable. No additional management support is needed unless otherwise documented below in the visit note. 

## 2013-11-26 NOTE — Patient Instructions (Signed)
Drink lots of fluids and rest  Try the robitussin with codeine for cough -watch out for sedation  Steam/ nasal saline are good for congestion If sinus pain/ green nasal discharge and fever - let us know (signs of a sinus infection)   Lab today

## 2013-11-27 DIAGNOSIS — Z8619 Personal history of other infectious and parasitic diseases: Secondary | ICD-10-CM | POA: Insufficient documentation

## 2013-11-27 NOTE — Assessment & Plan Note (Signed)
Disc symptomatic care - see instructions on AVS  Disc use of expectorant/steam / fluids and rest  Update if not starting to improve in a week or if worsening   Will watch for signs of sinusitis

## 2013-12-03 ENCOUNTER — Encounter: Payer: Self-pay | Admitting: Family Medicine

## 2013-12-03 ENCOUNTER — Ambulatory Visit (INDEPENDENT_AMBULATORY_CARE_PROVIDER_SITE_OTHER): Payer: Medicare Other | Admitting: Family Medicine

## 2013-12-03 VITALS — BP 130/80 | HR 64 | Temp 97.6°F | Ht 67.75 in | Wt 244.8 lb

## 2013-12-03 DIAGNOSIS — R739 Hyperglycemia, unspecified: Secondary | ICD-10-CM

## 2013-12-03 DIAGNOSIS — Z0000 Encounter for general adult medical examination without abnormal findings: Secondary | ICD-10-CM | POA: Insufficient documentation

## 2013-12-03 DIAGNOSIS — E785 Hyperlipidemia, unspecified: Secondary | ICD-10-CM

## 2013-12-03 DIAGNOSIS — E559 Vitamin D deficiency, unspecified: Secondary | ICD-10-CM

## 2013-12-03 NOTE — Assessment & Plan Note (Signed)
Reviewed health habits including diet and exercise and skin cancer prevention Reviewed appropriate screening tests for age  Also reviewed health mt list, fam hx and immunization status , as well as social and family history   See HPI Labs reviewed  Pneumovax next year  dexa next year

## 2013-12-03 NOTE — Progress Notes (Signed)
Subjective:    Patient ID: Lauren [name], female    DOB: 1945-12-14, 68 y.o.   MRN: [id number]  HPI I have personally reviewed the Medicare Annual Wellness questionnaire and have noted 1. The patient's medical and social history 2. Their use of alcohol, tobacco or illicit drugs 3. Their current medications and supplements 4. The patient's functional ability including ADL's, fall risks, home safety risks and hearing or visual             impairment. 5. Diet and physical activities 6. Evidence for depression or mood disorders  The patients weight, height, BMI have been recorded in the chart and visual acuity is per eye clinic.  I have made referrals, counseling and provided education to the patient based review of the above and I have provided the pt with a written personalized care plan for preventive services.  Getting over a uri  Cough med helped  Getting better  Cough is much better   Wt is down a lb with bmi of 37  See scanned forms.  Routine anticipatory guidance given to patient.  See health maintenance. Colon cancer screening 5/11 - 5year recall  Breast cancer screening- mastectomy , no more mammograms  Hx of breast ca  No change in self exam  Flu vaccine 10/15 Tetanus vaccine 9/08  Pneumovax 12/11  Zoster vaccine 1/07 Advance directive- has it set up  Cognitive function addressed- see scanned forms- and if abnormal then additional documentation follows. No major problems with memory - keeps her brain active   PMH and SH reviewed  Meds, vitals, and allergies reviewed.   ROS: See HPI.  Otherwise negative.      hyperlipidemia On zetia Lab Results  Component Value Date   CHOL 190 11/26/2013   CHOL 208* 08/20/2012   CHOL 200 02/06/2012   Lab Results  Component Value Date   HDL 51.60 11/26/2013   HDL 52.30 08/20/2012   HDL 49.70 02/06/2012   Lab Results  Component Value Date   LDLCALC 121* 11/26/2013   LDLCALC 129* 02/06/2012   LDLCALC 117* 02/10/2011    Lab Results  Component Value Date   TRIG 85.0 11/26/2013   TRIG 121.0 08/20/2012   TRIG 108.0 02/06/2012   Lab Results  Component Value Date   CHOLHDL 4 11/26/2013   CHOLHDL 4 08/20/2012   CHOLHDL 4 02/06/2012   Lab Results  Component Value Date   LDLDIRECT 148.9 08/20/2012   LDLDIRECT 184.7 02/11/2009   doing well with diet  She eats eggs  Diet is fair -does not watch everything   Is trying to stay away from sugar (she feels better with out it)  Hyperglycemia Lab Results  Component Value Date   HGBA1C 6.1 11/26/2013  still prediabetic  Is aware she needs to cut back more on sugar and carbs and loose wt    Hx of D def dexa 2010 -normal -(good)  D level is 2- good Takes D and is outdoors a lot  She wants to wait a year on the next dexa    Patient Active Problem List   Diagnosis Date Noted  . Encounter for Medicare annual wellness exam 12/03/2013  . History of shingles 11/27/2013  . Viral URI with cough 11/26/2013  . Hx of breast cancer 08/22/2011  . Gynecological examination 02/14/2011  . OSTEOARTHRITIS 04/23/2008  . Vitamin D deficiency 04/22/2008  . Hyperlipidemia 04/22/2008  . GERD 04/22/2008  . Hyperglycemia 04/22/2008   Past Medical History  Diagnosis Date  .  GERD (gastroesophageal reflux disease)   . HLD (hyperlipidemia)     statin intolerant  . Osteoarthritis   . Sjogren's syndrome   . Breast cancer     DCIS; BRCA 1 pos HER2 pos, ER2 pos  . Urine incontinence   . DDD (degenerative disc disease)   . Reactive depression (situational)     very well controlled, and now takes prozac for sleep  . Vitamin D deficiency   . Hyperglycemia 9/09    A1C elvated to 6.2  . Osteopenia 2007    ? from xray with nl dexa   Past Surgical History  Procedure Laterality Date  . Tonsillectomy  1974  . Lumbar disc surgery  1991    Ruptured disk L5  . Mastectomy  2001    bilateral  . Bilateral oophorectomy  205  . Total knee arthroplasty  2006    bilateral  .  Colonoscopy  5/07    normal; recheck 3 years  . Total hip arthroplasty  2011   History  Substance Use Topics  . Smoking status: Never Smoker   . Smokeless tobacco: Never Used  . Alcohol Use: Yes     Comment: occassional wine   Family History  Problem Relation Age of Onset  . Arthritis      family hx  . Breast cancer      1st degree relative <50  . Diabetes      1st degree relative  . Hyperlipidemia      family hx  . Hypertension      family hx  . Ovarian cancer      family hx  . Prostate cancer      1st degree relative <50  . Stroke      family hx  . Dementia Mother   . Psoriasis Brother   . Other Daughter     BRCA gene  . Brain cancer Brother   . Colon cancer Brother     also has BRCA gene   Allergies  Allergen Reactions  . Statins     REACTION: joint pain   Current Outpatient Prescriptions on File Prior to Visit  Medication Sig Dispense Refill  . Ascorbic Acid (VITAMIN C) 1000 MG tablet Take 1,000 mg by mouth 2 (two) times daily.        . Calcium-Magnesium 250-125 MG TABS Take 1 tablet by mouth daily.        . Cholecalciferol (VITAMIN D3) 400 UNITS CAPS Take 400 Units by mouth daily.       . cycloSPORINE (RESTASIS) 0.05 % ophthalmic emulsion 1 drop as directed.        . fish oil-omega-3 fatty acids 1000 MG capsule Take 1 g by mouth 2 (two) times daily.        Randell Loop Prim-Borage (FLAX OIL XTRA PO) Take 1 each by mouth.      Marland Kitchen FLUoxetine (PROZAC) 20 MG capsule Take one capsule by mouth one time daily  90 capsule  1  . omeprazole (PRILOSEC) 20 MG capsule Take 20 mg by mouth daily.        Marland Kitchen oxybutynin (OXYTROL) 3.9 MG/24HR Place 1 patch onto the skin 2 (two) times a week.  24 patch  3  . pilocarpine (SALAGEN) 5 MG tablet Take one tablet by mouth three times daily  270 tablet  1  . ZETIA 10 MG tablet Take one tablet by mouth one time daily  30 tablet  5  . [DISCONTINUED] oxybutynin (OXYTROL) 3.9 MG/24HR  Place 1 patch onto the skin 2 (two) times a week.  24  patch  3   No current facility-administered medications on file prior to visit.     Review of Systems Review of Systems  Constitutional: Negative for fever, appetite change, fatigue and unexpected weight change.  Eyes: Negative for pain and visual disturbance.  Respiratory: Negative for and shortness of breath.  pos for cough that is improving  Cardiovascular: Negative for cp or palpitations    Gastrointestinal: Negative for nausea, diarrhea and constipation.  Genitourinary: Negative for urgency and frequency.  Skin: Negative for pallor or rash   Neurological: Negative for weakness, light-headedness, numbness and headaches.  Hematological: Negative for adenopathy. Does not bruise/bleed easily.  Psychiatric/Behavioral: Negative for dysphoric mood. The patient is not nervous/anxious.         Objective:   Physical Exam  Constitutional: She appears well-developed and well-nourished. No distress.  obese and well appearing   HENT:  Head: Normocephalic and atraumatic.  Right Ear: External ear normal.  Left Ear: External ear normal.  Nose: Nose normal.  Mouth/Throat: Oropharynx is clear and moist.  Eyes: Conjunctivae and EOM are normal. Pupils are equal, round, and reactive to light. No scleral icterus.  Neck: Normal range of motion. Neck supple. No JVD present. Carotid bruit is not present. No thyromegaly present.  Cardiovascular: Normal rate, regular rhythm, normal heart sounds and intact distal pulses.  Exam reveals no gallop.   Pulmonary/Chest: Effort normal and breath sounds normal. No respiratory distress. She has no wheezes. She exhibits no tenderness.  Abdominal: Soft. Bowel sounds are normal. She exhibits no distension, no abdominal bruit and no mass. There is no tenderness.  Genitourinary:  S/p double mastectomy  Sites are nl / no LN felt in axillae   Musculoskeletal: Normal range of motion. She exhibits no edema and no tenderness.  No kyphosis   Lymphadenopathy:    She has  no cervical adenopathy.  Neurological: She is alert. She has normal reflexes. No cranial nerve deficit. She exhibits normal muscle tone. Coordination normal.  Skin: Skin is warm and dry. No rash noted. No erythema. No pallor.  Psychiatric: She has a normal mood and affect.          Assessment & Plan:   Problem List Items Addressed This Visit     Other   Vitamin D deficiency     Vitamin D level is therapeutic with current supplementation Disc importance of this to bone and overall health   dexa nl in 2010 Pt wants to get that in another year     Hyperlipidemia     Disc goals for lipids and reasons to control them Rev labs with pt Rev low sat fat diet in detail LDL is under 130 Continue zetia Diet could improve    Hyperglycemia      Lab Results  Component Value Date   HGBA1C 6.1 11/26/2013   Stressed imp of wt loss Also low glycemic diet  F/u 6 mo     Relevant Orders      Hemoglobin A1c   Encounter for Medicare annual wellness exam - Primary     Reviewed health habits including diet and exercise and skin cancer prevention Reviewed appropriate screening tests for age  Also reviewed health mt list, fam hx and immunization status , as well as social and family history   See HPI Labs reviewed  Pneumovax next year  dexa next year

## 2013-12-03 NOTE — Patient Instructions (Signed)
I'm glad you are doing well  Continue working on healthy diet and exercise Weight loss would decrease risk of diabetes    Follow up in 6 months with labs prior

## 2013-12-03 NOTE — Assessment & Plan Note (Signed)
Disc goals for lipids and reasons to control them Rev labs with pt Rev low sat fat diet in detail LDL is under 130 Continue zetia Diet could improve

## 2013-12-03 NOTE — Progress Notes (Signed)
Pre visit review using our clinic review tool, if applicable. No additional management support is needed unless otherwise documented below in the visit note. 

## 2013-12-03 NOTE — Assessment & Plan Note (Signed)
Vitamin D level is therapeutic with current supplementation Disc importance of this to bone and overall health   dexa nl in 2010 Pt wants to get that in another year

## 2013-12-03 NOTE — Assessment & Plan Note (Signed)
Lab Results  Component Value Date   HGBA1C 6.1 11/26/2013   Stressed imp of wt loss Also low glycemic diet  F/u 6 mo

## 2013-12-04 DIAGNOSIS — D3131 Benign neoplasm of right choroid: Secondary | ICD-10-CM | POA: Diagnosis not present

## 2013-12-04 DIAGNOSIS — H2513 Age-related nuclear cataract, bilateral: Secondary | ICD-10-CM | POA: Diagnosis not present

## 2013-12-04 DIAGNOSIS — H11223 Conjunctival granuloma, bilateral: Secondary | ICD-10-CM | POA: Diagnosis not present

## 2013-12-04 DIAGNOSIS — H04123 Dry eye syndrome of bilateral lacrimal glands: Secondary | ICD-10-CM | POA: Diagnosis not present

## 2013-12-13 ENCOUNTER — Other Ambulatory Visit: Payer: Self-pay | Admitting: Family Medicine

## 2013-12-13 NOTE — Telephone Encounter (Signed)
Electronic refill request, please advise  

## 2013-12-13 NOTE — Telephone Encounter (Signed)
Please refill for a year  

## 2013-12-13 NOTE — Telephone Encounter (Signed)
done

## 2014-03-19 ENCOUNTER — Other Ambulatory Visit: Payer: Self-pay | Admitting: Family Medicine

## 2014-04-22 ENCOUNTER — Other Ambulatory Visit: Payer: Self-pay | Admitting: Family Medicine

## 2014-05-05 ENCOUNTER — Encounter: Payer: Self-pay | Admitting: Gastroenterology

## 2014-05-28 ENCOUNTER — Other Ambulatory Visit: Payer: Self-pay | Admitting: Family Medicine

## 2014-05-28 ENCOUNTER — Other Ambulatory Visit (INDEPENDENT_AMBULATORY_CARE_PROVIDER_SITE_OTHER): Payer: Medicare Other

## 2014-05-28 DIAGNOSIS — R739 Hyperglycemia, unspecified: Secondary | ICD-10-CM | POA: Diagnosis not present

## 2014-05-28 LAB — HEMOGLOBIN A1C: Hgb A1c MFr Bld: 6.1 % (ref 4.6–6.5)

## 2014-06-04 ENCOUNTER — Ambulatory Visit: Payer: Medicare Other | Admitting: Family Medicine

## 2014-06-10 ENCOUNTER — Ambulatory Visit (INDEPENDENT_AMBULATORY_CARE_PROVIDER_SITE_OTHER): Payer: Medicare Other | Admitting: Family Medicine

## 2014-06-10 ENCOUNTER — Encounter: Payer: Self-pay | Admitting: Family Medicine

## 2014-06-10 VITALS — BP 134/76 | HR 74 | Temp 98.4°F | Ht 67.75 in | Wt 249.8 lb

## 2014-06-10 DIAGNOSIS — R739 Hyperglycemia, unspecified: Secondary | ICD-10-CM

## 2014-06-10 DIAGNOSIS — Z91030 Bee allergy status: Secondary | ICD-10-CM

## 2014-06-10 DIAGNOSIS — Z91038 Other insect allergy status: Secondary | ICD-10-CM | POA: Diagnosis not present

## 2014-06-10 DIAGNOSIS — E669 Obesity, unspecified: Secondary | ICD-10-CM | POA: Diagnosis not present

## 2014-06-10 MED ORDER — EPINEPHRINE 0.3 MG/0.3ML IJ SOAJ: 0.3000 mg | Freq: Once | INTRAMUSCULAR | Status: DC

## 2014-06-10 NOTE — Progress Notes (Signed)
Pre visit review using our clinic review tool, if applicable. No additional management support is needed unless otherwise documented below in the visit note. 

## 2014-06-10 NOTE — Patient Instructions (Signed)
Your A1C is stable at 6.1 Keep working on low sugar diet and exercise and weight loss I sent epi pen px to the pharmacy  Follow up in 6 months for annual exam with labs prior

## 2014-06-10 NOTE — Progress Notes (Signed)
Subjective:    Patient ID: Lauren [name], female    DOB: [birth year], 69 y.o.   MRN: [id number]  HPI Here for f/u  Is feeling great   Wt is up 5 lb with bmi of 38   Eating much more sugar for a while - hard to resist /thinks she is addicted   Now back off of it 2 weeks   Her feet were tingling   Hyperglycemia  Lab Results  Component Value Date   HGBA1C 6.1 05/28/2014  it has not gone up   Wt is up 5 lb   Exercise - she opened the pool- is back to swimming  Does water aerobics - in [place] / cardio - does 1 hour every day   Has a stationary bike - does not use it   Needs an epi pen - allergic to wasps and bees and horseflies    Patient Active Problem List   Diagnosis Date Noted  . Bee sting allergy 06/10/2014  . Obesity 06/10/2014  . Encounter for Medicare annual wellness exam 12/03/2013  . History of shingles 11/27/2013  . Hx of breast cancer 08/22/2011  . Gynecological examination 02/14/2011  . OSTEOARTHRITIS 04/23/2008  . Vitamin D deficiency 04/22/2008  . Hyperlipidemia 04/22/2008  . GERD 04/22/2008  . Hyperglycemia 04/22/2008   Past Medical History  Diagnosis Date  . GERD (gastroesophageal reflux disease)   . HLD (hyperlipidemia)     statin intolerant  . Osteoarthritis   . Sjogren's syndrome   . Breast cancer     DCIS; BRCA 1 pos HER2 pos, ER2 pos  . Urine incontinence   . DDD (degenerative disc disease)   . Reactive depression (situational)     very well controlled, and now takes prozac for sleep  . Vitamin D deficiency   . Hyperglycemia 9/09    A1C elvated to 6.2  . Osteopenia 2007    ? from xray with nl dexa   Past Surgical History  Procedure Laterality Date  . Tonsillectomy  1974  . Lumbar disc surgery  1991    Ruptured disk L5  . Mastectomy  2001    bilateral  . Bilateral oophorectomy  205  . Total knee arthroplasty  2006    bilateral  . Colonoscopy  5/07    normal; recheck 3 years  . Total hip arthroplasty  2011   History    Substance Use Topics  . Smoking status: Never Smoker   . Smokeless tobacco: Never Used  . Alcohol Use: 0.0 oz/week    0 Standard drinks or equivalent per week     Comment: occassional wine   Family History  Problem Relation Age of Onset  . Arthritis      family hx  . Breast cancer      1st degree relative <50  . Diabetes      1st degree relative  . Hyperlipidemia      family hx  . Hypertension      family hx  . Ovarian cancer      family hx  . Prostate cancer      1st degree relative <50  . Stroke      family hx  . Dementia Mother   . Psoriasis Brother   . Other Daughter     BRCA gene  . Brain cancer Brother   . Colon cancer Brother     also has BRCA gene   Allergies  Allergen Reactions  . Bee Venom   .  Statins     REACTION: joint pain   Current Outpatient Prescriptions on File Prior to Visit  Medication Sig Dispense Refill  . cycloSPORINE (RESTASIS) 0.05 % ophthalmic emulsion 1 drop as directed.      Marland Kitchen omeprazole (PRILOSEC) 20 MG capsule Take 20 mg by mouth daily.      Marland Kitchen oxybutynin (OXYTROL) 3.9 MG/24HR Place 1 patch onto the skin 2 (two) times a week. 24 patch 3  . pilocarpine (SALAGEN) 5 MG tablet TAKE ONE TABLET BY MOUTH THREE TIMES DAILY  270 tablet 3  . ZETIA 10 MG tablet TAKE 1 TABLET EVERY DAY 30 tablet 0   No current facility-administered medications on file prior to visit.     Review of Systems Review of Systems  Constitutional: Negative for fever, appetite change, fatigue and unexpected weight change.  Eyes: Negative for pain and visual disturbance.  Respiratory: Negative for cough and shortness of breath.   Cardiovascular: Negative for cp or palpitations    Gastrointestinal: Negative for nausea, diarrhea and constipation.  Genitourinary: Negative for urgency and frequency.  Skin: Negative for pallor or rash   Neurological: Negative for weakness, light-headedness, numbness and headaches.  Hematological: Negative for adenopathy. Does not  bruise/bleed easily.  Psychiatric/Behavioral: Negative for dysphoric mood. The patient is not nervous/anxious.  pos for compulsive overeating with "addiction to sugar and food in general"       Objective:   Physical Exam  Constitutional: She appears well-developed and well-nourished. No distress.  obese and well appearing   HENT:  Head: Normocephalic and atraumatic.  Mouth/Throat: Oropharynx is clear and moist.  Eyes: Conjunctivae and EOM are normal. Pupils are equal, round, and reactive to light. No scleral icterus.  Neck: Normal range of motion. Neck supple. No JVD present. Carotid bruit is not present. No thyromegaly present.  Cardiovascular: Normal rate, regular rhythm, normal heart sounds and intact distal pulses.  Exam reveals no gallop.   Pulmonary/Chest: Effort normal and breath sounds normal. No respiratory distress. She has no wheezes. She has no rales.  Musculoskeletal: She exhibits no edema.  Lymphadenopathy:    She has no cervical adenopathy.  Neurological: She is alert. She has normal reflexes. No cranial nerve deficit. She exhibits normal muscle tone. Coordination normal.  Skin: Skin is warm and dry. No rash noted. No erythema. No pallor.  Psychiatric: She has a normal mood and affect.          Assessment & Plan:   Problem List Items Addressed This Visit      Other   Bee sting allergy    Sent epi pen to pharmacy  Knows to keep this on hand when working outdoors       Hyperglycemia - Primary    Lab Results  Component Value Date   HGBA1C 6.1 05/28/2014   This is stable  Disc low glycemic diet and imp of exercise Enc wt loss       Obesity    Discussed how this problem influences overall health and the risks it imposes  Reviewed plan for weight loss with lower calorie diet (via better food choices and also portion control or program like weight watchers) and exercise building up to or more than 30 minutes 5 days per week including some aerobic activity    Is hyperglycemic

## 2014-06-11 NOTE — Assessment & Plan Note (Signed)
Discussed how this problem influences overall health and the risks it imposes  Reviewed plan for weight loss with lower calorie diet (via better food choices and also portion control or program like weight watchers) and exercise building up to or more than 30 minutes 5 days per week including some aerobic activity   Is hyperglycemic

## 2014-06-11 NOTE — Assessment & Plan Note (Signed)
Sent epi pen to pharmacy  Knows to keep this on hand when working outdoors

## 2014-06-11 NOTE — Assessment & Plan Note (Signed)
Lab Results  Component Value Date   HGBA1C 6.1 05/28/2014   This is stable  Disc low glycemic diet and imp of exercise Enc wt loss

## 2014-06-26 ENCOUNTER — Other Ambulatory Visit: Payer: Self-pay | Admitting: Family Medicine

## 2014-06-29 ENCOUNTER — Other Ambulatory Visit: Payer: Self-pay | Admitting: Family Medicine

## 2014-08-28 ENCOUNTER — Encounter: Payer: Self-pay | Admitting: *Deleted

## 2014-09-09 ENCOUNTER — Encounter: Payer: Self-pay | Admitting: *Deleted

## 2014-09-16 ENCOUNTER — Encounter: Payer: Self-pay | Admitting: *Deleted

## 2014-09-16 ENCOUNTER — Telehealth: Payer: Self-pay | Admitting: *Deleted

## 2014-09-16 NOTE — Telephone Encounter (Signed)
Mammogram f/u call: pt has had double mastectomy so she doesn't gets mammograms anymore, chart updated

## 2014-11-10 DIAGNOSIS — Z23 Encounter for immunization: Secondary | ICD-10-CM | POA: Diagnosis not present

## 2014-12-17 ENCOUNTER — Other Ambulatory Visit: Payer: Medicare Other

## 2014-12-18 ENCOUNTER — Telehealth: Payer: Self-pay | Admitting: Family Medicine

## 2014-12-18 DIAGNOSIS — E559 Vitamin D deficiency, unspecified: Secondary | ICD-10-CM

## 2014-12-18 DIAGNOSIS — E785 Hyperlipidemia, unspecified: Secondary | ICD-10-CM

## 2014-12-18 DIAGNOSIS — R739 Hyperglycemia, unspecified: Secondary | ICD-10-CM

## 2014-12-18 NOTE — Telephone Encounter (Signed)
-----   Message from Marchia Bond sent at 12/17/2014  9:12 AM EST ----- Regarding: Cpx labs Mon 11/14 need orders, thanks :-) Please order  future cpx labs for pt's upcoming lab appt. Thanks Aniceto Boss

## 2014-12-22 ENCOUNTER — Other Ambulatory Visit (INDEPENDENT_AMBULATORY_CARE_PROVIDER_SITE_OTHER): Payer: Medicare Other

## 2014-12-22 DIAGNOSIS — E785 Hyperlipidemia, unspecified: Secondary | ICD-10-CM

## 2014-12-22 DIAGNOSIS — E559 Vitamin D deficiency, unspecified: Secondary | ICD-10-CM | POA: Diagnosis not present

## 2014-12-22 DIAGNOSIS — R739 Hyperglycemia, unspecified: Secondary | ICD-10-CM

## 2014-12-22 LAB — COMPREHENSIVE METABOLIC PANEL
ALT: 12 U/L (ref 0–35)
AST: 15 U/L (ref 0–37)
Albumin: 3.9 g/dL (ref 3.5–5.2)
Alkaline Phosphatase: 69 U/L (ref 39–117)
BUN: 15 mg/dL (ref 6–23)
CO2: 29 mEq/L (ref 19–32)
Calcium: 9 mg/dL (ref 8.4–10.5)
Chloride: 104 mEq/L (ref 96–112)
Creatinine, Ser: 0.61 mg/dL (ref 0.40–1.20)
GFR: 103.15 mL/min (ref >60.00)
Glucose, Bld: 102 mg/dL — ABNORMAL HIGH (ref 70–99)
Potassium: 4.7 mEq/L (ref 3.5–5.1)
Sodium: 139 mEq/L (ref 135–145)
Total Bilirubin: 0.4 mg/dL (ref 0.2–1.2)
Total Protein: 6.8 g/dL (ref 6.0–8.3)

## 2014-12-22 LAB — LIPID PANEL
Cholesterol: 169 mg/dL (ref 0–200)
HDL: 46.3 mg/dL (ref >39.00)
LDL Cholesterol: 104 mg/dL — ABNORMAL HIGH (ref 0–99)
NonHDL: 123.11
Total CHOL/HDL Ratio: 4
Triglycerides: 94 mg/dL (ref 0.0–149.0)
VLDL: 18.8 mg/dL (ref 0.0–40.0)

## 2014-12-22 LAB — HEMOGLOBIN A1C: Hgb A1c MFr Bld: 6 % (ref 4.6–6.5)

## 2014-12-22 LAB — VITAMIN D 25 HYDROXY (VIT D DEFICIENCY, FRACTURES): VITD: 51.16 ng/mL (ref 30.00–100.00)

## 2014-12-24 ENCOUNTER — Encounter (INDEPENDENT_AMBULATORY_CARE_PROVIDER_SITE_OTHER): Payer: Self-pay

## 2014-12-24 ENCOUNTER — Encounter: Payer: Self-pay | Admitting: Family Medicine

## 2014-12-24 ENCOUNTER — Ambulatory Visit (INDEPENDENT_AMBULATORY_CARE_PROVIDER_SITE_OTHER): Payer: Medicare Other | Admitting: Family Medicine

## 2014-12-24 VITALS — BP 124/76 | HR 66 | Temp 98.1°F | Ht 67.5 in | Wt 244.8 lb

## 2014-12-24 DIAGNOSIS — E669 Obesity, unspecified: Secondary | ICD-10-CM

## 2014-12-24 DIAGNOSIS — Z0000 Encounter for general adult medical examination without abnormal findings: Secondary | ICD-10-CM

## 2014-12-24 DIAGNOSIS — Z1211 Encounter for screening for malignant neoplasm of colon: Secondary | ICD-10-CM | POA: Insufficient documentation

## 2014-12-24 DIAGNOSIS — R739 Hyperglycemia, unspecified: Secondary | ICD-10-CM | POA: Diagnosis not present

## 2014-12-24 DIAGNOSIS — Z23 Encounter for immunization: Secondary | ICD-10-CM | POA: Diagnosis not present

## 2014-12-24 DIAGNOSIS — E785 Hyperlipidemia, unspecified: Secondary | ICD-10-CM

## 2014-12-24 DIAGNOSIS — E559 Vitamin D deficiency, unspecified: Secondary | ICD-10-CM | POA: Diagnosis not present

## 2014-12-24 MED ORDER — EZETIMIBE 10 MG PO TABS: 10.0000 mg | ORAL_TABLET | Freq: Every day | ORAL | Status: DC

## 2014-12-24 MED ORDER — PILOCARPINE HCL 5 MG PO TABS: 5.0000 mg | ORAL_TABLET | Freq: Three times a day (TID) | ORAL | Status: DC

## 2014-12-24 MED ORDER — FLUOXETINE HCL 20 MG PO CAPS: 20.0000 mg | ORAL_CAPSULE | Freq: Every day | ORAL | Status: DC

## 2014-12-24 MED ORDER — OXYBUTYNIN 3.9 MG/24HR TD PTTW: 1.0000 | MEDICATED_PATCH | TRANSDERMAL | Status: DC

## 2014-12-24 NOTE — Progress Notes (Signed)
Pre visit review using our clinic review tool, if applicable. No additional management support is needed unless otherwise documented below in the visit note. 

## 2014-12-24 NOTE — Progress Notes (Signed)
Subjective:    Patient ID: Lauren [name], female    DOB: [birth year], 69 y.o.   MRN: [id number]  HPI Here for annual medicare wellness visit as well as chronic/acute medical problems   I have personally reviewed the Medicare Annual Wellness questionnaire and have noted 1. The patient's medical and social history 2. Their use of alcohol, tobacco or illicit drugs 3. Their current medications and supplements 4. The patient's functional ability including ADL's, fall risks, home safety risks and hearing or visual             impairment. 5. Diet and physical activities 6. Evidence for depression or mood disorders  The patients weight, height, BMI have been recorded in the chart and visual acuity is per eye clinic.  I have made referrals, counseling and provided education to the patient based review of the above and I have provided the pt with a written personalized care plan for preventive services. Reviewed and updated provider list, see scanned forms.  Doing great overall    Daughter had a double mastectomy - breast ca / now has had her hysterectomy  Also BRAC  She has lymphedema in her L arm from her own surgery (lost 20 LN)    See scanned forms.  Routine anticipatory guidance given to patient.  See health maintenance. Colon cancer screening 5/11 - polyps - 5 year recall  Her constipation is much better than it used to be  Breast cancer screening - does not need - /had mastectomy  Self breast exam-no change in surgical sites  Flu vaccine 10/16 Tetanus vaccine 9/08 Pap nl 2013  No gyn symptoms  Pneumovax 12/11 23, due for a PCV 13 Zoster vaccine 1/07 dexa 11/10 -in the normal range , no falls or fractures , takes her vit D (level is 51)  Hep C screen -not high risk/declines  Advance directive- has that already  Cognitive function addressed- see scanned forms- and if abnormal then additional documentation follows.  No major changes   PMH and SH reviewed  Meds, vitals, and  allergies reviewed.   ROS: See HPI.  Otherwise negative.    Hyperglycemia Lab Results  Component Value Date   HGBA1C 6.0 12/22/2014  down from 6.1  Eating healthy and less sugar   Wt is down 5 lb  No exercise now - wants to - may look for a pool /indor  bmi of 37 Obese  Hx of D def D level is 51- good  Hyperlipidemia Lab Results  Component Value Date   CHOL 169 12/22/2014   CHOL 190 11/26/2013   CHOL 208* 08/20/2012   Lab Results  Component Value Date   HDL 46.30 12/22/2014   HDL 51.60 11/26/2013   HDL 52.30 08/20/2012   Lab Results  Component Value Date   LDLCALC 104* 12/22/2014   LDLCALC 121* 11/26/2013   LDLCALC 129* 02/06/2012   Lab Results  Component Value Date   TRIG 94.0 12/22/2014   TRIG 85.0 11/26/2013   TRIG 121.0 08/20/2012   Lab Results  Component Value Date   CHOLHDL 4 12/22/2014   CHOLHDL 4 11/26/2013   CHOLHDL 4 08/20/2012   Lab Results  Component Value Date   LDLDIRECT 148.9 08/20/2012   LDLDIRECT 184.7 02/11/2009    Stable and improved LDL - better diet     Chemistry      Component Value Date/Time   NA 139 12/22/2014 1042   NA 140 10/31/2012 0821   K 4.7 12/22/2014 1042  K 4.3 10/31/2012 0821   CL 104 12/22/2014 1042   CL 103 10/26/2011 0957   CO2 29 12/22/2014 1042   CO2 22 10/31/2012 0821   BUN 15 12/22/2014 1042   BUN 12.1 10/31/2012 0821   CREATININE 0.61 12/22/2014 1042   CREATININE 0.7 10/31/2012 0821      Component Value Date/Time   CALCIUM 9.0 12/22/2014 1042   CALCIUM 9.6 10/31/2012 0821   ALKPHOS 69 12/22/2014 1042   ALKPHOS 82 10/31/2012 0821   AST 15 12/22/2014 1042   AST 15 10/31/2012 0821   ALT 12 12/22/2014 1042   ALT 12 10/31/2012 0821   BILITOT 0.4 12/22/2014 1042   BILITOT 0.37 10/31/2012 0821      Lab Results  Component Value Date   WBC 8.5 11/26/2013   HGB 14.7 11/26/2013   HCT 45.2 11/26/2013   MCV 88.6 11/26/2013   PLT 226.0 11/26/2013   Lab Results  Component Value Date   TSH 0.62  11/26/2013     Patient Active Problem List   Diagnosis Date Noted  . Bee sting allergy 06/10/2014  . Obesity 06/10/2014  . Encounter for Medicare annual wellness exam 12/03/2013  . History of shingles 11/27/2013  . Hx of breast cancer 08/22/2011  . Gynecological examination 02/14/2011  . OSTEOARTHRITIS 04/23/2008  . Vitamin D deficiency 04/22/2008  . Hyperlipidemia 04/22/2008  . GERD 04/22/2008  . Hyperglycemia 04/22/2008   Past Medical History  Diagnosis Date  . GERD (gastroesophageal reflux disease)   . HLD (hyperlipidemia)     statin intolerant  . Osteoarthritis   . Sjogren's syndrome ([place])   . Breast cancer ([place])     DCIS; BRCA 1 pos HER2 pos, ER2 pos  . Urine incontinence   . DDD (degenerative disc disease)   . Reactive depression (situational)     very well controlled, and now takes prozac for sleep  . Vitamin D deficiency   . Hyperglycemia 9/09    A1C elvated to 6.2  . Osteopenia 2007    ? from xray with nl dexa   Past Surgical History  Procedure Laterality Date  . Tonsillectomy  1974  . Lumbar disc surgery  1991    Ruptured disk L5  . Mastectomy  2001    bilateral  . Bilateral oophorectomy  205  . Total knee arthroplasty  2006    bilateral  . Colonoscopy  5/07    normal; recheck 3 years  . Total hip arthroplasty  2011   Social History  Substance Use Topics  . Smoking status: Never Smoker   . Smokeless tobacco: Never Used  . Alcohol Use: 0.0 oz/week    0 Standard drinks or equivalent per week     Comment: occassional wine   Family History  Problem Relation Age of Onset  . Arthritis      family hx  . Breast cancer      1st degree relative <50  . Diabetes      1st degree relative  . Hyperlipidemia      family hx  . Hypertension      family hx  . Ovarian cancer      family hx  . Prostate cancer      1st degree relative <50  . Stroke      family hx  . Dementia Mother   . Psoriasis Brother   . Other Daughter     BRCA gene  . Brain  cancer Brother   . Colon cancer Brother  also has BRCA gene   Allergies  Allergen Reactions  . Bee Venom   . Statins     REACTION: joint pain   Current Outpatient Prescriptions on File Prior to Visit  Medication Sig Dispense Refill  . cycloSPORINE (RESTASIS) 0.05 % ophthalmic emulsion 1 drop as directed.      Marland Kitchen EPINEPHrine 0.3 mg/0.3 mL IJ SOAJ injection Inject 0.3 mLs (0.3 mg total) into the muscle once. 1 Device 5  . omeprazole (PRILOSEC) 20 MG capsule Take 20 mg by mouth daily.      . pilocarpine (SALAGEN) 5 MG tablet TAKE ONE TABLET BY MOUTH THREE TIMES DAILY  270 tablet 3  . ZETIA 10 MG tablet TAKE 1 TABLET EVERY DAY 30 tablet 5   No current facility-administered medications on file prior to visit.     Review of Systems Review of Systems  Constitutional: Negative for fever, appetite change, fatigue and unexpected weight change.  Eyes: Negative for pain and visual disturbance.  Respiratory: Negative for cough and shortness of breath.   Cardiovascular: Negative for cp or palpitations    Gastrointestinal: Negative for nausea, diarrhea and constipation.  Genitourinary: Negative for urgency and frequency.  Skin: Negative for pallor or rash   Neurological: Negative for weakness, light-headedness, numbness and headaches.  Hematological: Negative for adenopathy. Does not bruise/bleed easily.  Psychiatric/Behavioral: Negative for dysphoric mood. The patient is not nervous/anxious.  pos for stressors        Objective:   Physical Exam  Constitutional: She appears well-developed and well-nourished. No distress.  obese and well appearing   HENT:  Head: Normocephalic and atraumatic.  Right Ear: External ear normal.  Left Ear: External ear normal.  Mouth/Throat: Oropharynx is clear and moist.  Eyes: Conjunctivae and EOM are normal. Pupils are equal, round, and reactive to light. No scleral icterus.  Neck: Normal range of motion. Neck supple. No JVD present. Carotid bruit is not  present. No thyromegaly present.  Cardiovascular: Normal rate, regular rhythm, normal heart sounds and intact distal pulses.  Exam reveals no gallop.   Pulmonary/Chest: Effort normal and breath sounds normal. No respiratory distress. She has no wheezes. She exhibits no tenderness.  Abdominal: Soft. Bowel sounds are normal. She exhibits no distension, no abdominal bruit and no mass. There is no tenderness.  Genitourinary:  Mastectomy sites are clean and nt bilat   Musculoskeletal: Normal range of motion. She exhibits no edema or tenderness.  No kyphosis   Lymphadenopathy:    She has no cervical adenopathy.  Neurological: She is alert. She has normal reflexes. No cranial nerve deficit. She exhibits normal muscle tone. Coordination normal.  Skin: Skin is warm and dry. No rash noted. No erythema. No pallor.  Psychiatric: She has a normal mood and affect.          Assessment & Plan:   Problem List Items Addressed This Visit      Other   Colon cancer screening    Ref for screening colonoscopy      Relevant Orders   Fecal occult blood, imunochemical   Encounter for Medicare annual wellness exam - Primary    Reviewed health habits including diet and exercise and skin cancer prevention Reviewed appropriate screening tests for age  Also reviewed health mt list, fam hx and immunization status , as well as social and family history   See HPI Labs reviewed Call us to get referral for colonoscopy when ready - or do it yourself  Please do the ifob stool  kit  prevnar vaccine today  Get back to exercise when you can       Hyperglycemia    Lab Results  Component Value Date   HGBA1C 6.0 12/22/2014   This is diet controlled Stressed imp of low glycemic diet and exercise and wt loss to prevent DM Continue to follow       Hyperlipidemia    Disc goals for lipids and reasons to control them Rev labs with pt Rev low sat fat diet in detail Intol of statin Continue zetia and diet          Relevant Medications   ezetimibe (ZETIA) 10 MG tablet   Obesity    Discussed how this problem influences overall health and the risks it imposes  Reviewed plan for weight loss with lower calorie diet (via better food choices and also portion control or program like weight watchers) and exercise building up to or more than 30 minutes 5 days per week including some aerobic activity         Vitamin D deficiency    Vitamin D level is therapeutic with current supplementation Disc importance of this to bone and overall health        Other Visit Diagnoses    Need for vaccination with 13-polyvalent pneumococcal conjugate vaccine        Relevant Orders    Pneumococcal conjugate vaccine 13-valent (Completed)

## 2014-12-24 NOTE — Patient Instructions (Addendum)
Call us to get referral for colonoscopy when ready - or do it yourself  Please do the ifob stool kit  prevnar vaccine today  Get back to exercise when you can

## 2014-12-25 NOTE — Assessment & Plan Note (Signed)
Discussed how this problem influences overall health and the risks it imposes  Reviewed plan for weight loss with lower calorie diet (via better food choices and also portion control or program like weight watchers) and exercise building up to or more than 30 minutes 5 days per week including some aerobic activity    

## 2014-12-25 NOTE — Assessment & Plan Note (Signed)
Reviewed health habits including diet and exercise and skin cancer prevention Reviewed appropriate screening tests for age  Also reviewed health mt list, fam hx and immunization status , as well as social and family history   See HPI Labs reviewed Call us to get referral for colonoscopy when ready - or do it yourself  Please do the ifob stool kit  prevnar vaccine today  Get back to exercise when you can

## 2014-12-25 NOTE — Assessment & Plan Note (Signed)
Ref for screening colonoscopy 

## 2014-12-25 NOTE — Assessment & Plan Note (Signed)
Lab Results  Component Value Date   HGBA1C 6.0 12/22/2014   This is diet controlled Stressed imp of low glycemic diet and exercise and wt loss to prevent DM Continue to follow

## 2014-12-25 NOTE — Assessment & Plan Note (Signed)
Vitamin D level is therapeutic with current supplementation Disc importance of this to bone and overall health  

## 2014-12-25 NOTE — Assessment & Plan Note (Signed)
Disc goals for lipids and reasons to control them Rev labs with pt Rev low sat fat diet in detail Intol of statin Continue zetia and diet

## 2015-02-11 ENCOUNTER — Other Ambulatory Visit: Payer: Self-pay | Admitting: Family Medicine

## 2015-03-03 ENCOUNTER — Telehealth: Payer: Self-pay | Admitting: Family Medicine

## 2015-03-03 NOTE — Telephone Encounter (Signed)
Form faxed to fax # on file, pt notified and form sent to scanning

## 2015-03-03 NOTE — Telephone Encounter (Signed)
Form in your inbox 

## 2015-03-03 NOTE — Telephone Encounter (Signed)
Done and in IN box 

## 2015-03-03 NOTE — Telephone Encounter (Signed)
Pt dropped off a letter with a rx for her prosthesis (the one before has expired). The best numbers to reach pt is [phone] or [phone]. Placing ppw in rx tower slot.

## 2015-03-11 ENCOUNTER — Encounter: Payer: Self-pay | Admitting: Family Medicine

## 2015-03-11 ENCOUNTER — Ambulatory Visit (INDEPENDENT_AMBULATORY_CARE_PROVIDER_SITE_OTHER): Payer: Medicare Other | Admitting: Family Medicine

## 2015-03-11 ENCOUNTER — Telehealth: Payer: Self-pay | Admitting: Family Medicine

## 2015-03-11 VITALS — BP 122/80 | HR 74 | Temp 98.0°F | Ht 67.5 in | Wt 240.5 lb

## 2015-03-11 DIAGNOSIS — B9789 Other viral agents as the cause of diseases classified elsewhere: Secondary | ICD-10-CM

## 2015-03-11 DIAGNOSIS — S0990XA Unspecified injury of head, initial encounter: Secondary | ICD-10-CM | POA: Diagnosis not present

## 2015-03-11 DIAGNOSIS — J069 Acute upper respiratory infection, unspecified: Secondary | ICD-10-CM | POA: Diagnosis not present

## 2015-03-11 DIAGNOSIS — R55 Syncope and collapse: Secondary | ICD-10-CM | POA: Diagnosis not present

## 2015-03-11 MED ORDER — BENZONATATE 200 MG PO CAPS: 200.0000 mg | ORAL_CAPSULE | Freq: Three times a day (TID) | ORAL | Status: DC | PRN

## 2015-03-11 MED ORDER — AZITHROMYCIN 250 MG PO TABS: ORAL_TABLET | ORAL | Status: DC

## 2015-03-11 MED ORDER — EZETIMIBE 10 MG PO TABS: 10.0000 mg | ORAL_TABLET | Freq: Every day | ORAL | Status: DC

## 2015-03-11 NOTE — Telephone Encounter (Signed)
Spoke with pt and she said she passed out on Tues morning not Monday; pt thinks only unconscious for few seconds;now no h/a but has had h/a on and off since started cold symptoms this past weekend prior to passing out. No dizziness, CP, SOB or vision problems. Pt states "she thinks she is OK and does not want to go to ED." pt has appt to see Dr Glori Bickers 03/11/15 at 2 PM. If pt condition changes or worsens prior to appt pt will call 911 and go to ED. Pt voiced understanding.

## 2015-03-11 NOTE — Progress Notes (Signed)
Subjective:    Patient ID: Lauren [name], female    DOB: [birth year], 70 y.o.   MRN: [id number]  HPI Here with syncope episode and uri   Has been sick since sat- has been in bed and not eating  Yesterday am - 5 am took a shower- (hot) Felt nauseated and dizzy  Woke up lying down on the floor of the small shower stall  Fell on L hip  Hit head on L frontal area - now has a black eye   Not confused afterwards  Was able to get up by herself    She thinks she may have not eaten enough that day  She does not think she was dehydrated - drinks water constantly    Did not tell anybody and then daughter found her with black eye Last faint was in HS   EKG - rate 72 NSR  No dizziness  Has had a headache since Saturday- sinus ha  Head was sore yesterday over L eye-better today   One scrape on L knee   Samuel [place] started in chest - took mucinex DM 12 hour Yellow mucous  Nasal congestion -since Monday and also sneezing  Sinus pain  Aches -some No chills or fever    Patient Active Problem List   Diagnosis Date Noted  . Syncope 03/11/2015  . Viral URI with cough 03/11/2015  . Head injury 03/11/2015  . Colon cancer screening 12/24/2014  . Bee sting allergy 06/10/2014  . Obesity 06/10/2014  . Encounter for Medicare annual wellness exam 12/03/2013  . History of shingles 11/27/2013  . Hx of breast cancer 08/22/2011  . Gynecological examination 02/14/2011  . OSTEOARTHRITIS 04/23/2008  . Vitamin D deficiency 04/22/2008  . Hyperlipidemia 04/22/2008  . GERD 04/22/2008  . Hyperglycemia 04/22/2008   Past Medical History  Diagnosis Date  . GERD (gastroesophageal reflux disease)   . HLD (hyperlipidemia)     statin intolerant  . Osteoarthritis   . Sjogren's syndrome ([place])   . Breast cancer ([place])     DCIS; BRCA 1 pos HER2 pos, ER2 pos  . Urine incontinence   . DDD (degenerative disc disease)   . Reactive depression (situational)     very well controlled, and now takes prozac for  sleep  . Vitamin D deficiency   . Hyperglycemia 9/09    A1C elvated to 6.2  . Osteopenia 2007    ? from xray with nl dexa   Past Surgical History  Procedure Laterality Date  . Tonsillectomy  1974  . Lumbar disc surgery  1991    Ruptured disk L5  . Mastectomy  2001    bilateral  . Bilateral oophorectomy  205  . Total knee arthroplasty  2006    bilateral  . Colonoscopy  5/07    normal; recheck 3 years  . Total hip arthroplasty  2011   Social History  Substance Use Topics  . Smoking status: Never Smoker   . Smokeless tobacco: Never Used  . Alcohol Use: 0.0 oz/week    0 Standard drinks or equivalent per week     Comment: occassional wine   Family History  Problem Relation Age of Onset  . Arthritis      family hx  . Breast cancer      1st degree relative <50  . Diabetes      1st degree relative  . Hyperlipidemia      family hx  . Hypertension      family hx  .  Ovarian cancer      family hx  . Prostate cancer      1st degree relative <50  . Stroke      family hx  . Dementia Mother   . Psoriasis Brother   . Other Daughter     BRCA gene  . Brain cancer Brother    Allergies  Allergen Reactions  . Bee Venom   . Statins     REACTION: joint pain   Current Outpatient Prescriptions on File Prior to Visit  Medication Sig Dispense Refill  . cycloSPORINE (RESTASIS) 0.05 % ophthalmic emulsion 1 drop as directed.      Marland Kitchen EPINEPHrine 0.3 mg/0.3 mL IJ SOAJ injection Inject 0.3 mLs (0.3 mg total) into the muscle once. 1 Device 5  . Fish Oil-Cholecalciferol (OMEGA-3 + VITAMIN D3 PO) Take 1 capsule by mouth daily.    Marland Kitchen FLUoxetine (PROZAC) 20 MG capsule Take 1 capsule (20 mg total) by mouth daily. 90 capsule 3  . omeprazole (PRILOSEC) 20 MG capsule Take 20 mg by mouth daily.      Marland Kitchen oxybutynin (OXYTROL) 3.9 MG/24HR Place 1 patch onto the skin 2 (two) times a week. 24 patch 3  . pilocarpine (SALAGEN) 5 MG tablet Take 1 tablet (5 mg total) by mouth 3 (three) times daily. 270  tablet 3   No current facility-administered medications on file prior to visit.    Review of Systems Review of Systems  Constitutional: Negative for fever, appetite change,  and unexpected weight change. pos for fatigue and malaise ENT pos for cong/rhinorrhea and sinus pressure  Eyes: Negative for pain and visual disturbance.  Respiratory: Negative for wheeze and shortness of breath.   Cardiovascular: Negative for cp or palpitations    Gastrointestinal: Negative for nausea, diarrhea and constipation.  Genitourinary: Negative for urgency and frequency.  Skin: Negative for pallor or rash   Neurological: Negative for weakness, light-headedness, numbness and headaches.pos for syncope times one   Hematological: Negative for adenopathy. Does not bruise/bleed easily.  Psychiatric/Behavioral: Negative for dysphoric mood. The patient is not nervous/anxious.         Objective:   Physical Exam  Constitutional: She is oriented to person, place, and time. She appears well-developed and well-nourished. No distress.  obese and well appearing   HENT:  Head: Normocephalic and atraumatic.  Right Ear: External ear normal.  Left Ear: External ear normal.  Nose: Nose normal.  Mouth/Throat: Oropharynx is clear and moist. No oropharyngeal exudate.  Nares are injected and congested  Very mild maxillary  sinus tenderness Clear rhinorrhea and post nasal drip   Eyes: Conjunctivae and EOM are normal. Pupils are equal, round, and reactive to light. Right eye exhibits no discharge. Left eye exhibits no discharge. No scleral icterus.  No nystagmus  Neck: Normal range of motion and full passive range of motion without pain. Neck supple. No JVD present. Carotid bruit is not present. No tracheal deviation present. No thyromegaly present.  Cardiovascular: Normal rate, regular rhythm, normal heart sounds and intact distal pulses.  Exam reveals no gallop.   No murmur heard. Pulmonary/Chest: Effort normal and breath  sounds normal. No respiratory distress. She has no wheezes. She has no rales. She exhibits no tenderness.  No crackles  Harsh bs  Few scattered rhonchi  No rales   Abdominal: Soft. Bowel sounds are normal. She exhibits no distension, no abdominal bruit and no mass. There is no tenderness.  Musculoskeletal: She exhibits no edema or tenderness.  Lymphadenopathy:  She has no cervical adenopathy.  Neurological: She is alert and oriented to person, place, and time. She has normal strength and normal reflexes. She displays no atrophy and no tremor. No cranial nerve deficit or sensory deficit. She exhibits normal muscle tone. Coordination and gait normal.  No focal cerebellar signs   Skin: Skin is warm and dry. No rash noted. No pallor.  Ecchymosis over L upper eyelid  Small tender area L forehead with scant swelling   Psychiatric: She has a normal mood and affect. Her behavior is normal. Thought content normal.          Assessment & Plan:   Problem List Items Addressed This Visit      Cardiovascular and Mediastinum   Syncope - Primary    Suspect vasovagal syncope during illness that was both orthostatic and from vasodilation with hot water (shower)  Reassuring EKG and exam today  Will watch for any re occurrence -if pt feels faint will lie down  Disc imp of good hydration  Small meals - (even with no appetite)  Monitor head injury      Relevant Medications   ezetimibe (ZETIA) 10 MG tablet   Other Relevant Orders   EKG 12-Lead (Completed)     Respiratory   Viral URI with cough    With prod cough and episode of weakness and vasovagal syncope  Cover with zpak given severity -though did not hear rales Tessalon for cough  Rest /fluids Disc symptomatic care - see instructions on AVS  Update if not starting to improve in a week or if worsening        Relevant Medications   azithromycin (ZITHROMAX Z-PAK) 250 MG tablet     Other   Head injury    Bump on L forehead from  recent syncope and ecchymosis of eye (though no eye injury) No s/s of concussion Disc what to watch for re: head injury -see AVS Will alert [place] if needed or go to ED if sudden headache or confusion

## 2015-03-11 NOTE — Assessment & Plan Note (Signed)
Bump on L forehead from recent syncope and ecchymosis of eye (though no eye injury) No s/s of concussion Disc what to watch for re: head injury -see AVS Will alert [place] if needed or go to ED if sudden headache or confusion

## 2015-03-11 NOTE — Assessment & Plan Note (Signed)
With prod cough and episode of weakness and vasovagal syncope  Cover with zpak given severity -though did not hear rales Tessalon for cough  Rest /fluids Disc symptomatic care - see instructions on AVS  Update if not starting to improve in a week or if worsening

## 2015-03-11 NOTE — Telephone Encounter (Signed)
PLEASE NOTE: All timestamps contained within this report are represented as [place] Standard Time. CONFIDENTIALTY NOTICE: This fax transmission is intended only for the addressee. It contains information that is legally privileged, confidential or otherwise protected from use or disclosure. If you are not the intended recipient, you are strictly prohibited from reviewing, disclosing, copying using or disseminating any of this information or taking any action in reliance on or regarding this information. If you have received this fax in error, please notify us immediately by telephone so that we can arrange for its return to [place]. Phone: [phone], Toll-Free: [phone], Fax: [phone] Page: 1 of 2 Call Id: [id number] [place] Patient Name: Lauren [name] Gender: Female DOB: [birth year] Age: 70 Y 10 M 23 D Return Phone Number: [phone] (Primary), [phone] (Secondary) Address: [place] City/State/Zip: [place] [address] Client [facility] - Client Client Site [facility], Roque Lias Contact Type Call Call Type Triage / Clinical Relationship To Patient Self Appointment Disposition EMR Patient Reports Appointment Already Scheduled Info pasted into Epic Yes Return Phone Number [phone] (Primary) Chief Complaint HEAD INJURY - and not acting right. Change in behaviour after hitting head. Initial Comment Caller states has a respiratory thing; passed out yesterday; today eye is black; vomiting last night; has congestion; sick to stomach last night; has not eaten much; PreDisposition Call Doctor Nurse Assessment Nurse: Lauren Girt, RN, Almyra Free Date/Time [name] Time): 03/11/2015 8:27:41 AM Confirm and document reason for call. If symptomatic, describe symptoms. You must click the next button to save  text entered. ---[name] has had a cough/nasal congestion and passed out in the shower on Monday. She did vomit last night, is still nauseated and she has a black left eye. She does have a headache but that began on Saturday with respiratory sx and is not worse. Has the patient traveled out of the country within the last 30 days? ---Not Applicable Does the patient have any new or worsening symptoms? ---Yes Will a triage be completed? ---Yes Related visit to physician within the last 2 weeks? ---No Does the PT have any chronic conditions? (i.e. diabetes, asthma, etc.) ---Yes List chronic conditions. ---HX BTKA, RTHA Is this a behavioral health or substance abuse call? ---No Guidelines Guideline Title Affirmed Question Affirmed Notes Nurse Date/Time [name] Time) Head Injury Vomiting once or more Lauren [name] 03/11/2015 8:33:23 AM Disp. Time [name] Time) Disposition Final User 03/11/2015 8:22:08 AM Send to Urgent Lauren [name] 03/11/2015 9:03:56 AM Called On-Call Provider Lauren Girt, RN, Lauren [name] NOTE: All timestamps contained within this report are represented as [place] Standard Time. CONFIDENTIALTY NOTICE: This fax transmission is intended only for the addressee. It contains information that is legally privileged, confidential or otherwise protected from use or disclosure. If you are not the intended recipient, you are strictly prohibited from reviewing, disclosing, copying using or disseminating any of this information or taking any action in reliance on or regarding this information. If you have received this fax in error, please notify us immediately by telephone so that we can arrange for its return to [place]. Phone: [phone], Toll-Free: [phone], Fax: [phone] Page: 2 of 2 Call Id: [id number] [place]. Time [name] Time) Disposition Final User Reason: Called back line, information provided, pt refused ED outcome. Will fax note. 03/11/2015 9:04:12 AM Call  Completed Lauren Girt RN, Almyra Free 03/11/2015 8:36:10 AM Go to ED Now Yes Lauren Girt,  RN, Dagoberto Reef Understands: Yes Disagree/Comply: Disagree Disagree/Comply Reason: Disagree with instructions Care Advice Given Per Guideline GO TO ED NOW: You need to be seen in the Emergency Department. Go to the ER at ___________ [place] now. Drive carefully. DRIVING: Another adult should drive. CARE ADVICE given per Head Injury (Adult) guideline. After Care Instructions Given Call Event Type User Date / Time Description Comments User: Eloisa Northern, RN Date/Time [name] Time): 03/11/2015 8:54:37 AM Leda Gauze refused ED outcome, states she was given a 2 pm appointment prior to triage. She verbalized understanding of all instructions and information and will cb if her sx worsen. Referrals GO TO FACILITY REFUSED

## 2015-03-11 NOTE — Assessment & Plan Note (Signed)
Suspect vasovagal syncope during illness that was both orthostatic and from vasodilation with hot water (shower)  Reassuring EKG and exam today  Will watch for any re occurrence -if pt feels faint will lie down  Disc imp of good hydration  Small meals - (even with no appetite)  Monitor head injury

## 2015-03-11 NOTE — Patient Instructions (Addendum)
Eat a little through the day even if you are not hungry Drink lots of fluids  Take zpak as directed Tessalon for cough  If you feel faint again - lie down on the floor immediately -and let me know  Use ice on the bump on your head  If headache , dizziness, vision change, confusion, etc- do alert [place]   Update if not starting to improve in a week or if worsening

## 2015-03-11 NOTE — Progress Notes (Signed)
Pre visit review using our clinic review tool, if applicable. No additional management support is needed unless otherwise documented below in the visit note. 

## 2015-03-11 NOTE — Telephone Encounter (Signed)
[name]     Patient Name: Lauren [name] Initial Comment Caller states has a respiratory thing; passed out yesterday; today eye is black; vomiting last night; has congestion; sick to stomach last night; has not eaten much;   DOB: [birth year]      Nurse Assessment  Nurse: Julien Girt, RN, Almyra Free Date/Time (Eastern Time): 03/11/2015 8:27:41 AM  Confirm and document reason for call. If symptomatic, describe symptoms. You must click the next button to save text entered. ---[name] has had a cough/nasal congestion and passed out in the shower on Monday. She did vomit last night, is still nauseated and she has a black left eye. She does have a headache but that began on Saturday with respiratory sx and is not worse.  Has the patient traveled out of the country within the last 30 days? ---Not Applicable  Does the patient have any new or worsening symptoms? ---Yes  Will a triage be completed? ---Yes  Related visit to physician within the last 2 weeks? ---No  Does the PT have any chronic conditions? (i.e. diabetes, asthma, etc.) ---Yes  List chronic conditions. ---HX BTKA, RTHA  Is this a behavioral health or substance abuse call? ---No    Guidelines     Guideline Title Affirmed Question Affirmed Notes   Head Injury Vomiting once or more    Final Disposition User   Go to ED Now Julien Girt, RN, [place] refused ED outcome, states she was given a 2 pm appointment prior to triage. She verbalized understanding of all instructions and information and will cb if her sx worsen.   Referrals   GO TO FACILITY REFUSED   Disagree/Comply: Disagree   Disagree/Comply Reason: Disagree with instructions

## 2015-03-11 NOTE — Telephone Encounter (Signed)
I will see her as planned 

## 2015-04-06 ENCOUNTER — Encounter: Payer: Self-pay | Admitting: Family Medicine

## 2015-04-06 ENCOUNTER — Ambulatory Visit (INDEPENDENT_AMBULATORY_CARE_PROVIDER_SITE_OTHER): Payer: Medicare Other | Admitting: Family Medicine

## 2015-04-06 VITALS — BP 122/76 | HR 69 | Temp 97.7°F | Ht 67.5 in | Wt 243.8 lb

## 2015-04-06 DIAGNOSIS — J069 Acute upper respiratory infection, unspecified: Secondary | ICD-10-CM | POA: Diagnosis not present

## 2015-04-06 DIAGNOSIS — Z20818 Contact with and (suspected) exposure to other bacterial communicable diseases: Secondary | ICD-10-CM

## 2015-04-06 DIAGNOSIS — B9789 Other viral agents as the cause of diseases classified elsewhere: Principal | ICD-10-CM

## 2015-04-06 DIAGNOSIS — J029 Acute pharyngitis, unspecified: Secondary | ICD-10-CM | POA: Diagnosis not present

## 2015-04-06 DIAGNOSIS — Z2089 Contact with and (suspected) exposure to other communicable diseases: Secondary | ICD-10-CM | POA: Diagnosis not present

## 2015-04-06 LAB — POCT RAPID STREP A (OFFICE): Rapid Strep A Screen: NEGATIVE

## 2015-04-06 NOTE — Assessment & Plan Note (Signed)
Much improved / had zpak - but family has strep throat and wanted her tested Neg RST Pending throat cx  Thinks she was a carrier years ago  Disc symptomatic care - see instructions on AVS  Update if not starting to improve in a week or if worsening

## 2015-04-06 NOTE — Patient Instructions (Signed)
I did a strep culture- the rapid test is negative I think you are still getting over virus (and some allergies) Drink fluids  Take allegra if it helps Update if not starting to improve in a week or if worsening

## 2015-04-06 NOTE — Assessment & Plan Note (Signed)
Suspect from her viral uri  Family has strep Neg RST Will do cx Used to be a carrier years ago Reassuring exam Disc symptomatic care - see instructions on AVS  Update if not starting to improve in a week or if worsening

## 2015-04-06 NOTE — Progress Notes (Signed)
Pre visit review using our clinic review tool, if applicable. No additional management support is needed unless otherwise documented below in the visit note. 

## 2015-04-06 NOTE — Progress Notes (Signed)
Subjective:    Patient ID: Lauren [name], female    DOB: 01-26-1946, 70 y.o.   MRN: [id number]  HPI Here for strep check -whole family has it   Started with hacking cough/st and congestion  ST is not really bad  Needs proof it is not strep   Family is getting tx  Of note- they had neg RST but pos cx   Cough is not prod  Some sneezing (like allergy problems)   Was a carrier of strep years and years ago  She did have to have tonsils out as a kit   Taking allegra  Patient Active Problem List   Diagnosis Date Noted  . Syncope 03/11/2015  . Viral URI with cough 03/11/2015  . Head injury 03/11/2015  . Colon cancer screening 12/24/2014  . Bee sting allergy 06/10/2014  . Obesity 06/10/2014  . Encounter for Medicare annual wellness exam 12/03/2013  . History of shingles 11/27/2013  . Hx of breast cancer 08/22/2011  . Gynecological examination 02/14/2011  . OSTEOARTHRITIS 04/23/2008  . Vitamin D deficiency 04/22/2008  . Hyperlipidemia 04/22/2008  . GERD 04/22/2008  . Hyperglycemia 04/22/2008   Past Medical History  Diagnosis Date  . GERD (gastroesophageal reflux disease)   . HLD (hyperlipidemia)     statin intolerant  . Osteoarthritis   . Sjogren's syndrome ([place])   . Breast cancer ([place])     DCIS; BRCA 1 pos HER2 pos, ER2 pos  . Urine incontinence   . DDD (degenerative disc disease)   . Reactive depression (situational)     very well controlled, and now takes prozac for sleep  . Vitamin D deficiency   . Hyperglycemia 9/09    A1C elvated to 6.2  . Osteopenia 2007    ? from xray with nl dexa   Past Surgical History  Procedure Laterality Date  . Tonsillectomy  1974  . Lumbar disc surgery  1991    Ruptured disk L5  . Mastectomy  2001    bilateral  . Bilateral oophorectomy  205  . Total knee arthroplasty  2006    bilateral  . Colonoscopy  5/07    normal; recheck 3 years  . Total hip arthroplasty  2011   Social History  Substance Use Topics  . Smoking  status: Never Smoker   . Smokeless tobacco: Never Used  . Alcohol Use: 0.0 oz/week    0 Standard drinks or equivalent per week     Comment: occassional wine   Family History  Problem Relation Age of Onset  . Arthritis      family hx  . Breast cancer      1st degree relative <50  . Diabetes      1st degree relative  . Hyperlipidemia      family hx  . Hypertension      family hx  . Ovarian cancer      family hx  . Prostate cancer      1st degree relative <50  . Stroke      family hx  . Dementia Mother   . Psoriasis Brother   . Other Daughter     BRCA gene  . Brain cancer Brother    Allergies  Allergen Reactions  . Bee Venom   . Statins     REACTION: joint pain   Current Outpatient Prescriptions on File Prior to Visit  Medication Sig Dispense Refill  . cycloSPORINE (RESTASIS) 0.05 % ophthalmic emulsion 1 drop as directed.      Marland Kitchen  EPINEPHrine 0.3 mg/0.3 mL IJ SOAJ injection Inject 0.3 mLs (0.3 mg total) into the muscle once. 1 Device 5  . ezetimibe (ZETIA) 10 MG tablet Take 1 tablet (10 mg total) by mouth daily. 90 tablet 3  . Fish Oil-Cholecalciferol (OMEGA-3 + VITAMIN D3 PO) Take 1 capsule by mouth daily.    Marland Kitchen FLUoxetine (PROZAC) 20 MG capsule Take 1 capsule (20 mg total) by mouth daily. 90 capsule 3  . omeprazole (PRILOSEC) 20 MG capsule Take 20 mg by mouth daily.      Marland Kitchen oxybutynin (OXYTROL) 3.9 MG/24HR Place 1 patch onto the skin 2 (two) times a week. 24 patch 3  . pilocarpine (SALAGEN) 5 MG tablet Take 1 tablet (5 mg total) by mouth 3 (three) times daily. 270 tablet 3   No current facility-administered medications on file prior to visit.    Review of Systems    Review of Systems  Constitutional: Negative for fever, appetite change,  and unexpected weight change.  ENt pos for rhinorrhea and pnd and st (mild) , neg for sinus pain  Eyes: Negative for pain and visual disturbance.  Respiratory: Negative for wheeze  and shortness of breath.   Cardiovascular: Negative  for cp or palpitations    Gastrointestinal: Negative for nausea, diarrhea and constipation.  Genitourinary: Negative for urgency and frequency.  Skin: Negative for pallor or rash   Neurological: Negative for weakness, light-headedness, numbness and headaches.  Hematological: Negative for adenopathy. Does not bruise/bleed easily.  Psychiatric/Behavioral: Negative for dysphoric mood. The patient is not nervous/anxious.      Objective:   Physical Exam  Constitutional: She appears well-developed and well-nourished. No distress.  obese and well appearing   HENT:  Head: Normocephalic and atraumatic.  Right Ear: External ear normal.  Left Ear: External ear normal.  Mouth/Throat: Oropharynx is clear and moist.  Nares are injected and congested  No sinus tenderness Clear rhinorrhea and post nasal drip   Mild posterior inj of throat   Eyes: Conjunctivae and EOM are normal. Pupils are equal, round, and reactive to light. Right eye exhibits no discharge. Left eye exhibits no discharge.  Neck: Normal range of motion. Neck supple.  Cardiovascular: Normal rate and normal heart sounds.   Pulmonary/Chest: Effort normal and breath sounds normal. No respiratory distress. She has no wheezes. She has no rales. She exhibits no tenderness.  Abdominal: Soft. She exhibits no distension. There is no tenderness.  Lymphadenopathy:    She has no cervical adenopathy.  Neurological: She is alert.  Skin: Skin is warm and dry. No rash noted. No erythema.  Psychiatric: She has a normal mood and affect.          Assessment & Plan:   Problem List Items Addressed This Visit      Respiratory   Viral URI with cough    Much improved / had zpak - but family has strep throat and wanted her tested Neg RST Pending throat cx  Thinks she was a carrier years ago  Disc symptomatic care - see instructions on AVS  Update if not starting to improve in a week or if worsening          Other   Sore throat     Suspect from her viral uri  Family has strep Neg RST Will do cx Used to be a carrier years ago Reassuring exam Disc symptomatic care - see instructions on AVS  Update if not starting to improve in a week or if worsening  Relevant Orders   Culture, Group A Strep    Other Visit Diagnoses    Exposure to strep throat    -  Primary    Relevant Orders    Rapid Strep A (Completed)    Culture, Group A Strep

## 2015-04-08 LAB — CULTURE, GROUP A STREP: Organism ID, Bacteria: NORMAL

## 2015-07-21 ENCOUNTER — Ambulatory Visit (INDEPENDENT_AMBULATORY_CARE_PROVIDER_SITE_OTHER): Payer: Medicare Other | Admitting: Family

## 2015-07-21 ENCOUNTER — Encounter: Payer: Self-pay | Admitting: Family

## 2015-07-21 VITALS — BP 140/88 | HR 75 | Temp 98.5°F | Ht 68.0 in | Wt 251.2 lb

## 2015-07-21 DIAGNOSIS — R05 Cough: Secondary | ICD-10-CM

## 2015-07-21 DIAGNOSIS — R059 Cough, unspecified: Secondary | ICD-10-CM

## 2015-07-21 MED ORDER — AZITHROMYCIN 250 MG PO TABS: ORAL_TABLET | ORAL | Status: DC

## 2015-07-21 NOTE — Patient Instructions (Signed)

## 2015-07-21 NOTE — Progress Notes (Signed)
Subjective:    Patient ID: Lauren [name], female    DOB: [birth year], 70 y.o.   MRN: [id number]   Lauren [name] is a 70 y.o. female who presents today for an acute visit.    HPI Comments: Patient here for evaluation of nasal congestion and productive cough for one week, worsening. Cough worse at night. Endorses ear pain, sinus pressure. Has been taking Mucinex with some relief. Using nasal saline spray. Stopped using her allegra has thought allergy season was over. No sob, wheezing, fever, chills.   Past Medical History  Diagnosis Date  . GERD (gastroesophageal reflux disease)   . HLD (hyperlipidemia)     statin intolerant  . Osteoarthritis   . Sjogren's syndrome ([place])   . Breast cancer ([place])     DCIS; BRCA 1 pos HER2 pos, ER2 pos  . Urine incontinence   . DDD (degenerative disc disease)   . Reactive depression (situational)     very well controlled, and now takes prozac for sleep  . Vitamin D deficiency   . Hyperglycemia 9/09    A1C elvated to 6.2  . Osteopenia 2007    ? from xray with nl dexa   Allergies: Bee venom and Statins Current Outpatient Prescriptions on File Prior to Visit  Medication Sig Dispense Refill  . cycloSPORINE (RESTASIS) 0.05 % ophthalmic emulsion 1 drop as directed.      Marland Kitchen EPINEPHrine 0.3 mg/0.3 mL IJ SOAJ injection Inject 0.3 mLs (0.3 mg total) into the muscle once. 1 Device 5  . ezetimibe (ZETIA) 10 MG tablet Take 1 tablet (10 mg total) by mouth daily. 90 tablet 3  . Fish Oil-Cholecalciferol (OMEGA-3 + VITAMIN D3 PO) Take 1 capsule by mouth daily.    Marland Kitchen FLUoxetine (PROZAC) 20 MG capsule Take 1 capsule (20 mg total) by mouth daily. 90 capsule 3  . omeprazole (PRILOSEC) 20 MG capsule Take 20 mg by mouth daily.      Marland Kitchen oxybutynin (OXYTROL) 3.9 MG/24HR Place 1 patch onto the skin 2 (two) times a week. 24 patch 3  . pilocarpine (SALAGEN) 5 MG tablet Take 1 tablet (5 mg total) by mouth 3 (three) times daily. 270 tablet 3   No current facility-administered  medications on file prior to visit.    Social History  Substance Use Topics  . Smoking status: Never Smoker   . Smokeless tobacco: Never Used  . Alcohol Use: 0.0 oz/week    0 Standard drinks or equivalent per week     Comment: occassional wine    Review of Systems  Constitutional: Negative for fever and chills.  HENT: Positive for congestion and postnasal drip. Negative for sinus pressure and sore throat.   Respiratory: Positive for cough. Negative for shortness of breath and wheezing.   Cardiovascular: Negative for chest pain and palpitations.  Gastrointestinal: Negative for nausea and vomiting.      Objective:    BP 140/88 mmHg  Pulse 75  Temp(Src) 98.5 F (36.9 C)  Ht '5\' 8"'  (1.727 m)  Wt 251 lb 3.2 oz (113.944 kg)  BMI 38.20 kg/m2  SpO2 98%   Physical Exam  Constitutional: She appears well-developed and well-nourished.  HENT:  Head: Normocephalic and atraumatic.  Right Ear: Hearing, tympanic membrane, external ear and ear canal normal. No drainage, swelling or tenderness. No foreign bodies. Tympanic membrane is not erythematous and not bulging. No middle ear effusion. No decreased hearing is noted.  Left Ear: Hearing, tympanic membrane, external ear and ear canal normal. No drainage,  swelling or tenderness. No foreign bodies. Tympanic membrane is not erythematous and not bulging.  No middle ear effusion. No decreased hearing is noted.  Nose: Rhinorrhea present. Right sinus exhibits no maxillary sinus tenderness and no frontal sinus tenderness. Left sinus exhibits no maxillary sinus tenderness and no frontal sinus tenderness.  Mouth/Throat: Uvula is midline, oropharynx is clear and moist and mucous membranes are normal. No oropharyngeal exudate, posterior oropharyngeal edema, posterior oropharyngeal erythema or tonsillar abscesses.  Eyes: Conjunctivae are normal.  Cardiovascular: Regular rhythm, normal heart sounds and normal pulses.   Pulmonary/Chest: Effort normal and  breath sounds normal. She has no wheezes. She has no rhonchi. She has no rales.  Lymphadenopathy:       Head (right side): No submental, no submandibular, no tonsillar, no preauricular, no posterior auricular and no occipital adenopathy present.       Head (left side): No submental, no submandibular, no tonsillar, no preauricular, no posterior auricular and no occipital adenopathy present.    She has no cervical adenopathy.  Neurological: She is alert.  Skin: Skin is warm and dry.  Psychiatric: She has a normal mood and affect. Her speech is normal and behavior is normal. Thought content normal.  Vitals reviewed.      Assessment & Plan:   1. Cough Suspect bacterial URI. Due to worsening of cough and congestion symptoms, patient and I jointly agreed to start an antibiotic. - azithromycin (ZITHROMAX) 250 MG tablet; Tale 500 mg PO on day 1, then 250 mg PO q24h x 4 days.  Dispense: 6 tablet; Refill: 0    I am having Ms. Atkin maintain her omeprazole, cycloSPORINE, EPINEPHrine, Fish Oil-Cholecalciferol (OMEGA-3 + VITAMIN D3 PO), oxybutynin, pilocarpine, FLUoxetine, ezetimibe, and azithromycin.   Meds ordered this encounter  Medications  . DISCONTD: azithromycin (ZITHROMAX) 250 MG tablet    Sig: Tale 500 mg PO on day 1, then 250 mg PO q24h x 4 days.    Dispense:  6 tablet    Refill:  0    Order Specific Question:  Supervising Provider    Answer:  Cassandria Anger [[id number]]  . azithromycin (ZITHROMAX) 250 MG tablet    Sig: Tale 500 mg PO on day 1, then 250 mg PO q24h x 4 days.    Dispense:  6 tablet    Refill:  0    Order Specific Question:  Supervising Provider    Answer:  Cassandria Anger [[id number]]     Start medications as prescribed and explained to patient on After Visit Summary ( AVS). Risks, benefits, and alternatives of the medications and treatment plan prescribed today were discussed, and patient expressed understanding.   Education regarding symptom management and  diagnosis given to patient.   Follow-up:Plan follow-up and return precautions given if any worsening symptoms or change in condition.   Continue to follow with Loura Pardon, MD for routine health maintenance.   Lauren [name] and I agreed with plan.   Mable Paris, FNP

## 2015-07-25 DIAGNOSIS — Z23 Encounter for immunization: Secondary | ICD-10-CM | POA: Diagnosis not present

## 2015-07-25 DIAGNOSIS — S61202A Unspecified open wound of right middle finger without damage to nail, initial encounter: Secondary | ICD-10-CM | POA: Diagnosis not present

## 2015-09-09 DIAGNOSIS — D2261 Melanocytic nevi of right upper limb, including shoulder: Secondary | ICD-10-CM | POA: Diagnosis not present

## 2015-09-09 DIAGNOSIS — L728 Other follicular cysts of the skin and subcutaneous tissue: Secondary | ICD-10-CM | POA: Diagnosis not present

## 2015-09-09 DIAGNOSIS — X32XXXA Exposure to sunlight, initial encounter: Secondary | ICD-10-CM | POA: Diagnosis not present

## 2015-09-09 DIAGNOSIS — L570 Actinic keratosis: Secondary | ICD-10-CM | POA: Diagnosis not present

## 2015-10-23 DIAGNOSIS — R208 Other disturbances of skin sensation: Secondary | ICD-10-CM | POA: Diagnosis not present

## 2015-10-23 DIAGNOSIS — L538 Other specified erythematous conditions: Secondary | ICD-10-CM | POA: Diagnosis not present

## 2015-10-23 DIAGNOSIS — L728 Other follicular cysts of the skin and subcutaneous tissue: Secondary | ICD-10-CM | POA: Diagnosis not present

## 2015-10-23 DIAGNOSIS — L720 Epidermal cyst: Secondary | ICD-10-CM | POA: Diagnosis not present

## 2015-10-23 DIAGNOSIS — Z23 Encounter for immunization: Secondary | ICD-10-CM | POA: Diagnosis not present

## 2015-12-21 ENCOUNTER — Telehealth: Payer: Self-pay | Admitting: Family Medicine

## 2015-12-21 DIAGNOSIS — E7800 Pure hypercholesterolemia, unspecified: Secondary | ICD-10-CM

## 2015-12-21 DIAGNOSIS — R739 Hyperglycemia, unspecified: Secondary | ICD-10-CM

## 2015-12-21 DIAGNOSIS — K219 Gastro-esophageal reflux disease without esophagitis: Secondary | ICD-10-CM

## 2015-12-21 DIAGNOSIS — E559 Vitamin D deficiency, unspecified: Secondary | ICD-10-CM

## 2015-12-21 DIAGNOSIS — Z853 Personal history of malignant neoplasm of breast: Secondary | ICD-10-CM

## 2015-12-21 NOTE — Telephone Encounter (Signed)
Future labs 

## 2015-12-22 ENCOUNTER — Other Ambulatory Visit: Payer: Medicare Other

## 2015-12-22 ENCOUNTER — Ambulatory Visit (INDEPENDENT_AMBULATORY_CARE_PROVIDER_SITE_OTHER): Payer: Medicare Other

## 2015-12-22 VITALS — BP 112/80 | HR 68 | Temp 98.0°F | Ht 67.5 in | Wt 249.8 lb

## 2015-12-22 DIAGNOSIS — R739 Hyperglycemia, unspecified: Secondary | ICD-10-CM | POA: Diagnosis not present

## 2015-12-22 DIAGNOSIS — Z853 Personal history of malignant neoplasm of breast: Secondary | ICD-10-CM | POA: Diagnosis not present

## 2015-12-22 DIAGNOSIS — E559 Vitamin D deficiency, unspecified: Secondary | ICD-10-CM | POA: Diagnosis not present

## 2015-12-22 DIAGNOSIS — K219 Gastro-esophageal reflux disease without esophagitis: Secondary | ICD-10-CM | POA: Diagnosis not present

## 2015-12-22 DIAGNOSIS — Z1159 Encounter for screening for other viral diseases: Secondary | ICD-10-CM

## 2015-12-22 DIAGNOSIS — E7800 Pure hypercholesterolemia, unspecified: Secondary | ICD-10-CM | POA: Diagnosis not present

## 2015-12-22 DIAGNOSIS — Z0000 Encounter for general adult medical examination without abnormal findings: Secondary | ICD-10-CM

## 2015-12-22 LAB — CBC WITH DIFFERENTIAL/PLATELET
Basophils Absolute: 0 10*3/uL (ref 0.0–0.1)
Basophils Relative: 0.6 % (ref 0.0–3.0)
Eosinophils Absolute: 0.2 10*3/uL (ref 0.0–0.7)
Eosinophils Relative: 2.8 % (ref 0.0–5.0)
HCT: 44.8 % (ref 36.0–46.0)
Hemoglobin: 14.8 g/dL (ref 12.0–15.0)
Lymphocytes Relative: 21.7 % (ref 12.0–46.0)
Lymphs Abs: 1.7 10*3/uL (ref 0.7–4.0)
MCHC: 33 g/dL (ref 30.0–36.0)
MCV: 87.1 fl (ref 78.0–100.0)
Monocytes Absolute: 0.6 10*3/uL (ref 0.1–1.0)
Monocytes Relative: 7.7 % (ref 3.0–12.0)
Neutro Abs: 5.2 10*3/uL (ref 1.4–7.7)
Neutrophils Relative %: 67.2 % (ref 43.0–77.0)
Platelets: 230 10*3/uL (ref 150.0–400.0)
RBC: 5.15 Mil/uL — ABNORMAL HIGH (ref 3.87–5.11)
RDW: 14.3 % (ref 11.5–15.5)
WBC: 7.7 10*3/uL (ref 4.0–10.5)

## 2015-12-22 LAB — LIPID PANEL
Cholesterol: 174 mg/dL (ref 0–200)
HDL: 50.4 mg/dL (ref >39.00)
LDL Cholesterol: 102 mg/dL — ABNORMAL HIGH (ref 0–99)
NonHDL: 123.46
Total CHOL/HDL Ratio: 3
Triglycerides: 106 mg/dL (ref 0.0–149.0)
VLDL: 21.2 mg/dL (ref 0.0–40.0)

## 2015-12-22 LAB — COMPREHENSIVE METABOLIC PANEL
ALT: 14 U/L (ref 0–35)
AST: 17 U/L (ref 0–37)
Albumin: 4 g/dL (ref 3.5–5.2)
Alkaline Phosphatase: 72 U/L (ref 39–117)
BUN: 13 mg/dL (ref 6–23)
CO2: 27 mEq/L (ref 19–32)
Calcium: 9.3 mg/dL (ref 8.4–10.5)
Chloride: 102 mEq/L (ref 96–112)
Creatinine, Ser: 0.65 mg/dL (ref 0.40–1.20)
GFR: 95.59 mL/min (ref >60.00)
Glucose, Bld: 96 mg/dL (ref 70–99)
Potassium: 4.4 mEq/L (ref 3.5–5.1)
Sodium: 138 mEq/L (ref 135–145)
Total Bilirubin: 0.4 mg/dL (ref 0.2–1.2)
Total Protein: 7.2 g/dL (ref 6.0–8.3)

## 2015-12-22 LAB — HEMOGLOBIN A1C: Hgb A1c MFr Bld: 6.1 % (ref 4.6–6.5)

## 2015-12-22 LAB — VITAMIN D 25 HYDROXY (VIT D DEFICIENCY, FRACTURES): VITD: 44.68 ng/mL (ref 30.00–100.00)

## 2015-12-22 LAB — TSH: TSH: 0.52 u[IU]/mL (ref 0.35–4.50)

## 2015-12-22 NOTE — Progress Notes (Signed)
Subjective:   Lauren [name] is a 70 y.o. female who presents for Medicare Annual (Subsequent) preventive examination.  Review of Systems:  N/A Cardiac Risk Factors include: advanced age (>51mn, >>58women);obesity (BMI >30kg/m2);dyslipidemia     Objective:     Vitals: BP 112/80 (BP Location: Right Arm, Patient Position: Sitting, Cuff Size: Large)   Pulse 68   Temp 98 F (36.7 C) (Oral)   Ht 5' 7.5" (1.715 m) Comment: no shoes  Wt 249 lb 12 oz (113.3 kg)   SpO2 95%   BMI 38.54 kg/m   Body mass index is 38.54 kg/m.   Tobacco History  Smoking Status  . Never Smoker  Smokeless Tobacco  . Never Used     Counseling given: No   Past Medical History:  Diagnosis Date  . [place]    DCIS; BRCA 1 pos HER2 pos, ER2 pos  . DDD (degenerative disc disease)   . GERD (gastroesophageal reflux disease)   . HLD (hyperlipidemia)    statin intolerant  . Hyperglycemia 9/09   A1C elvated to 6.2  . Osteoarthritis   . Osteopenia 2007   ? from xray with nl dexa  . Reactive depression (situational)    very well controlled, and now takes prozac for sleep  . [place]'s [place]   . Urine incontinence   . Vitamin D deficiency    Past Surgical History:  Procedure Laterality Date  . BILATERAL OOPHORECTOMY  205  . COLONOSCOPY  5/07   normal; recheck 3 years  . LUMBAR DISC SURGERY  1991   Ruptured disk L5  . MASTECTOMY  2001   bilateral  . TONSILLECTOMY  1974  . TOTAL HIP ARTHROPLASTY  2011  . TOTAL KNEE ARTHROPLASTY  2006   bilateral   Family History  Problem Relation Age of Onset  . Arthritis      family hx  . Breast cancer      1st degree relative <50  . Diabetes      1st degree relative  . Hyperlipidemia      family hx  . Hypertension      family hx  . Ovarian cancer      family hx  . Prostate cancer      1st degree relative <50  . Stroke      family hx  . Dementia Mother   . Psoriasis Brother   . Other Daughter     BRCA gene  . Brain  cancer Brother    History  Sexual Activity  . Sexual activity: Not Currently    Outpatient Encounter Prescriptions as of 12/22/2015  Medication Sig  . cetirizine (ZYRTEC) 5 MG tablet Take 5 mg by mouth daily.  . cycloSPORINE (RESTASIS) 0.05 % ophthalmic emulsion 1 drop as directed.    .Marland KitchenEPINEPHrine 0.3 mg/0.3 mL IJ SOAJ injection Inject 0.3 mLs (0.3 mg total) into the muscle once.  . ezetimibe (ZETIA) 10 MG tablet Take 1 tablet (10 mg total) by mouth daily.  . Fish Oil-Cholecalciferol (OMEGA-3 + VITAMIN D3 PO) Take 1 capsule by mouth daily.  .Marland KitchenFLUoxetine (PROZAC) 20 MG capsule Take 1 capsule (20 mg total) by mouth daily.  .Marland Kitchenomeprazole (PRILOSEC) 20 MG capsule Take 20 mg by mouth daily.    .Marland Kitchenoxybutynin (OXYTROL) 3.9 MG/24HR Place 1 patch onto the skin 2 (two) times a week.  . pilocarpine (SALAGEN) 5 MG tablet Take 1 tablet (5 mg total) by mouth 3 (three) times daily.  . [  DISCONTINUED] azithromycin (ZITHROMAX) 250 MG tablet Tale 500 mg PO on day 1, then 250 mg PO q24h x 4 days.   No facility-administered encounter medications on file as of 12/22/2015.     Activities of Daily Living In your present state of health, do you have any difficulty performing the following activities: 12/22/2015  Hearing? N  Vision? N  Difficulty concentrating or making decisions? N  Walking or climbing stairs? N  Dressing or bathing? N  Doing errands, shopping? N  Preparing Food and eating ? N  Using the Toilet? N  In the past six months, have you accidently leaked urine? Y  Do you have problems with loss of bowel control? N  Managing your Medications? N  Managing your Finances? N  Housekeeping or managing your Housekeeping? N  Some recent data might be hidden    Patient Care Team: Abner Greenspan, MD as PCP - General Sharol Roussel, OD as Physician Assistant (Optometry)    Assessment:     Hearing Screening   '125Hz'  '250Hz'  '500Hz'  '1000Hz'  '2000Hz'  '3000Hz'  '4000Hz'  '6000Hz'  '8000Hz'   Right ear:   40 40 40  40      Left ear:   40 40 40  0    Vision Screening Comments: Last vision exam with Dr. Caro Laroche in Nov 2016   Exercise Activities and Dietary recommendations Current Exercise Habits: The patient does not participate in regular exercise at present, Exercise limited by: None identified  Goals    . Increase water intake          Starting 12/22/2015, I will continue to drink at least 64 oz water daily.       Fall Risk Fall Risk  12/22/2015 12/24/2014 12/03/2013 02/15/2012  Falls in the past year? No No No Yes  Number falls in past yr: - - - 1  Injury with Fall? - - - Yes   Depression Screen PHQ 2/9 Scores 12/22/2015 12/24/2014 12/03/2013 02/15/2012  PHQ - 2 Score 0 0 0 0     Cognitive Function MMSE - Mini Mental State Exam 12/22/2015  Orientation to time 5  Orientation to Place 5  Registration 3  Attention/ Calculation 0  Recall 3  Language- name 2 objects 0  Language- repeat 1  Language- follow 3 step command 3  Language- read & follow direction 0  Write a sentence 0  Copy design 0  Total score 20     PLEASE NOTE: A Mini-Cog screen was completed. Maximum score is 20. A value of 0 denotes this part of [place] MMSE was not completed or the patient failed this part of the Mini-Cog screening.   Mini-Cog Screening Orientation to Time - Max 5 pts Orientation to Place - Max 5 pts Registration - Max 3 pts Recall - Max 3 pts Language Repeat - Max 1 pts Language Follow 3 Step Command - Max 3 pts     Immunization History  Administered Date(s) Administered  . Influenza Split 11/26/2010  . Influenza Whole 12/06/2009  . Influenza, Seasonal, Injecte, Preservative Fre 10/23/2015  . Influenza,inj,Quad PF,36+ Mos 11/26/2013, 11/10/2014  . Pneumococcal Conjugate-13 12/24/2014  . Pneumococcal Polysaccharide-23 01/19/2010  . Td 10/13/2006  . Zoster 02/07/2005   Screening Tests Health Maintenance  Topic Date Due  . PAP SMEAR  12/21/2016 (Originally 02/14/2012)  . Fecal DNA (Cologuard)   12/21/2016 (Originally 04/16/1995)  . MAMMOGRAM  03/11/2020 (Originally 04/16/1995)  . PNA vac Low Risk Adult (2 of 2 - PPSV23) 12/24/2015  . TETANUS/TDAP  07/08/2025  . INFLUENZA VACCINE  Completed  . DEXA SCAN  Completed  . ZOSTAVAX  Completed  . Hepatitis C Screening  Completed      Plan:     I have personally reviewed and addressed the Medicare Annual Wellness questionnaire and have noted the following in the patient's chart:  A. Medical and social history B. Use of alcohol, tobacco or illicit drugs  C. Current medications and supplements D. Functional ability and status E.  Nutritional status F.  Physical activity G. Advance directives H. List of other physicians I.  Hospitalizations, surgeries, and ER visits in previous 12 months J.  [place] to include hearing, vision, cognitive, depression L. Referrals and appointments - none  In addition, I have reviewed and discussed with patient certain preventive protocols, quality metrics, and best practice recommendations. A written personalized care plan for preventive services as well as general preventive health recommendations were provided to patient.  See attached scanned questionnaire for additional information.   Signed,   Lindell Noe, MHA, BS, LPN Health Coach

## 2015-12-22 NOTE — Progress Notes (Signed)
Pre visit review using our clinic review tool, if applicable. No additional management support is needed unless otherwise documented below in the visit note. 

## 2015-12-22 NOTE — Progress Notes (Signed)
PCP notes:   Health maintenance:  Colon cancer screening - pt request to do Cologuard instead of FOBT kit Pap smear - pt declined Hep C screening - completed PNA vaccine - pt will discuss with PCP at next appt  Abnormal screenings:   Hearing - failed  Patient concerns:   None  Nurse concerns:  None  Next PCP appt:   12/25/2015 @ 1030  I reviewed health advisor's note, was available for consultation, and agree with documentation and plan.  Loura Pardon MD

## 2015-12-22 NOTE — Patient Instructions (Signed)
Lauren [name] , Thank you for taking time to come for your Medicare Wellness Visit. I appreciate your ongoing commitment to your health goals. Please review the following plan we discussed and let me know if I can assist you in the future.   These are the goals we discussed: Goals    . Increase water intake          Starting 12/22/2015, I will continue to drink at least 64 oz water daily.        This is a list of the screening recommended for you and due dates:  Health Maintenance  Topic Date Due  . Pap Smear  12/21/2016*  . Mammogram  03/11/2020*  . Pneumonia vaccines (2 of 2 - PPSV23) 12/24/2015  . Colon Cancer Screening  06/12/2019  . Tetanus Vaccine  07/08/2025  . Flu Shot  Completed  . DEXA scan (bone density measurement)  Completed  . Shingles Vaccine  Completed  .  Hepatitis C: One time screening is recommended by Center for Disease Control  (CDC) for  adults born from [birth year] through 1965.   Completed  *Topic was postponed. The date shown is not the original due date.   Preventive Care for Adults  A healthy lifestyle and preventive care can promote health and wellness. Preventive health guidelines for adults include the following key practices.  . A routine yearly physical is a good way to check with your health care provider about your health and preventive screening. It is a chance to share any concerns and updates on your health and to receive a thorough exam.  . Visit your dentist for a routine exam and preventive care every 6 months. Brush your teeth twice a day and floss once a day. Good oral hygiene prevents tooth decay and gum disease.  . The frequency of eye exams is based on your age, health, family medical history, use  of contact lenses, and other factors. Follow your health care provider's ecommendations for frequency of eye exams.  . Eat a healthy diet. Foods like vegetables, fruits, whole grains, low-fat dairy products, and lean protein foods contain the  nutrients you need without too many calories. Decrease your intake of foods high in solid fats, added sugars, and salt. Eat the right amount of calories for you. Get information about a proper diet from your health care provider, if necessary.  . Regular physical exercise is one of the most important things you can do for your health. Most adults should get at least 150 minutes of moderate-intensity exercise (any activity that increases your heart rate and causes you to sweat) each week. In addition, most adults need muscle-strengthening exercises on 2 or more days a week.  Silver Sneakers may be a benefit available to you. To determine eligibility, you may visit the website: www.silversneakers.com or contact program at 360-768-9913 Mon-Fri between 8AM-8PM.   . Maintain a healthy weight. The body mass index (BMI) is a screening tool to identify possible weight problems. It provides an estimate of body fat based on height and weight. Your health care provider can find your BMI and can help you achieve or maintain a healthy weight.   For adults 20 years and older: ? A BMI below 18.5 is considered underweight. ? A BMI of 18.5 to 24.9 is normal. ? A BMI of 25 to 29.9 is considered overweight. ? A BMI of 30 and above is considered obese.   . Maintain normal blood lipids and cholesterol levels by exercising and  minimizing your intake of saturated fat. Eat a balanced diet with plenty of fruit and vegetables. Blood tests for lipids and cholesterol should begin at age 44 and be repeated every 5 years. If your lipid or cholesterol levels are high, you are over 50, or you are at high risk for heart disease, you may need your cholesterol levels checked more frequently. Ongoing high lipid and cholesterol levels should be treated with medicines if diet and exercise are not working.  . If you smoke, find out from your health care provider how to quit. If you do not use tobacco, please do not start.  . If you  choose to drink alcohol, please do not consume more than 2 drinks per day. One drink is considered to be 12 ounces (355 mL) of beer, 5 ounces (148 mL) of wine, or 1.5 ounces (44 mL) of liquor.  . If you are 67-71 years old, ask your health care provider if you should take aspirin to prevent strokes.  . Use sunscreen. Apply sunscreen liberally and repeatedly throughout the day. You should seek shade when your shadow is shorter than you. Protect yourself by wearing long sleeves, pants, a wide-brimmed hat, and sunglasses year round, whenever you are outdoors.  . Once a month, do a whole body skin exam, using a mirror to look at the skin on your back. Tell your health care provider of new moles, moles that have irregular borders, moles that are larger than a pencil eraser, or moles that have changed in shape or color.

## 2015-12-23 LAB — HEPATITIS C ANTIBODY: HCV Ab: NEGATIVE

## 2015-12-25 ENCOUNTER — Encounter: Payer: Self-pay | Admitting: Family Medicine

## 2015-12-25 ENCOUNTER — Ambulatory Visit (INDEPENDENT_AMBULATORY_CARE_PROVIDER_SITE_OTHER): Payer: Medicare Other | Admitting: Family Medicine

## 2015-12-25 VITALS — BP 122/76 | HR 69 | Temp 98.4°F | Ht 67.5 in | Wt 250.2 lb

## 2015-12-25 DIAGNOSIS — E7800 Pure hypercholesterolemia, unspecified: Secondary | ICD-10-CM | POA: Diagnosis not present

## 2015-12-25 DIAGNOSIS — R739 Hyperglycemia, unspecified: Secondary | ICD-10-CM

## 2015-12-25 DIAGNOSIS — E6609 Other obesity due to excess calories: Secondary | ICD-10-CM | POA: Diagnosis not present

## 2015-12-25 DIAGNOSIS — Z6838 Body mass index (BMI) 38.0-38.9, adult: Secondary | ICD-10-CM

## 2015-12-25 DIAGNOSIS — E559 Vitamin D deficiency, unspecified: Secondary | ICD-10-CM | POA: Diagnosis not present

## 2015-12-25 MED ORDER — CELECOXIB 200 MG PO CAPS: 200.0000 mg | ORAL_CAPSULE | Freq: Two times a day (BID) | ORAL | 0 refills | Status: DC

## 2015-12-25 NOTE — Progress Notes (Signed)
Subjective:    Patient ID: Lauren [name], female    DOB: 1945/10/31, 70 y.o.   MRN: [id number]  HPI Here for annual f/u of chronic medical problems   Feeling great   Wt Readings from Last 3 Encounters:  12/25/15 250 lb 4 oz (113.5 kg)  12/22/15 249 lb 12 oz (113.3 kg)  07/21/15 251 lb 3.2 oz (113.9 kg)  she is back to wt loss effort- off sweets and sugar for 2 wk now (sugar addict)  Less cravings now after 2 weeks  No exercise yet-making a plan , she just bought some athletic shoes and she is going to start walking (10 min intervals) Still has stationary bike  bmi is 38.6  Needs refill of celebrex   Had her [facility] visit 11/14 cologuard ordered  Hep C screening completed   Pt debated whether she had rec the prevnar vaccine   (it is documented in chart for 12/24/14) She is absolutely positive she did not get it  On hearing exam missed '4000Hz'  in L ear only Has tinnitus in that ear-it does not bother her that much    Pap 1/13 She does not want to keep doing them  No pelvic symptoms at all   Colon cancer screening  Colonoscopy 5/11-polyps (adenomatous) She does not want to get a colonoscopy right now  Was signed up with cologuard  boweles are very regular now   Mammogram - does not get/ has had mastectomy Personal hx of breast cancer Self breast exam   Tetanus shot 6/17   PNA 23 was 12/11   dexa 11/10-normal range  Did take femara after her breast cancer and arimidex  D level is 44.6 Not taking ca and D-can start back  Wants to wait longer on her next dexa  No fam hx  No falls or fractures   Hx of hyperlipidemia Lab Results  Component Value Date   CHOL 174 12/22/2015   CHOL 169 12/22/2014   CHOL 190 11/26/2013   Lab Results  Component Value Date   HDL 50.40 12/22/2015   HDL 46.30 12/22/2014   HDL 51.60 11/26/2013   Lab Results  Component Value Date   LDLCALC 102 (H) 12/22/2015   LDLCALC 104 (H) 12/22/2014   LDLCALC 121 (H) 11/26/2013   Lab  Results  Component Value Date   TRIG 106.0 12/22/2015   TRIG 94.0 12/22/2014   TRIG 85.0 11/26/2013   Lab Results  Component Value Date   CHOLHDL 3 12/22/2015   CHOLHDL 4 12/22/2014   CHOLHDL 4 11/26/2013   Lab Results  Component Value Date   LDLDIRECT 148.9 08/20/2012   LDLDIRECT 184.7 02/11/2009   on zetia and diet and fish oil/omega 3 She cannot tolerate statins  Good profile    Hx of hyperglycemia Lab Results  Component Value Date   HGBA1C 6.1 12/22/2015  this is stable Last A1C was 6.0  Results for orders placed or performed in visit on 12/22/15  CBC with Differential/Platelet  Result Value Ref Range   WBC 7.7 4.0 - 10.5 K/uL   RBC 5.15 (H) 3.87 - 5.11 Mil/uL   Hemoglobin 14.8 12.0 - 15.0 g/dL   HCT 44.8 36.0 - 46.0 %   MCV 87.1 78.0 - 100.0 fl   MCHC 33.0 30.0 - 36.0 g/dL   RDW 14.3 11.5 - 15.5 %   Platelets 230.0 150.0 - 400.0 K/uL   Neutrophils Relative % 67.2 43.0 - 77.0 %   Lymphocytes Relative 21.7 12.0 -  46.0 %   Monocytes Relative 7.7 3.0 - 12.0 %   Eosinophils Relative 2.8 0.0 - 5.0 %   Basophils Relative 0.6 0.0 - 3.0 %   Neutro Abs 5.2 1.4 - 7.7 K/uL   Lymphs Abs 1.7 0.7 - 4.0 K/uL   Monocytes Absolute 0.6 0.1 - 1.0 K/uL   Eosinophils Absolute 0.2 0.0 - 0.7 K/uL   Basophils Absolute 0.0 0.0 - 0.1 K/uL  Comprehensive metabolic panel  Result Value Ref Range   Sodium 138 135 - 145 mEq/L   Potassium 4.4 3.5 - 5.1 mEq/L   Chloride 102 96 - 112 mEq/L   CO2 27 19 - 32 mEq/L   Glucose, Bld 96 70 - 99 mg/dL   BUN 13 6 - 23 mg/dL   Creatinine, Ser 0.65 0.40 - 1.20 mg/dL   Total Bilirubin 0.4 0.2 - 1.2 mg/dL   Alkaline Phosphatase 72 39 - 117 U/L   AST 17 0 - 37 U/L   ALT 14 0 - 35 U/L   Total Protein 7.2 6.0 - 8.3 g/dL   Albumin 4.0 3.5 - 5.2 g/dL   Calcium 9.3 8.4 - 10.5 mg/dL   GFR 95.59 >60.00 mL/min  Lipid panel  Result Value Ref Range   Cholesterol 174 0 - 200 mg/dL   Triglycerides 106.0 0.0 - 149.0 mg/dL   HDL 50.40 >39.00 mg/dL    VLDL 21.2 0.0 - 40.0 mg/dL   LDL Cholesterol 102 (H) 0 - 99 mg/dL   Total CHOL/HDL Ratio 3    NonHDL 123.46   TSH  Result Value Ref Range   TSH 0.52 0.35 - 4.50 uIU/mL  VITAMIN D 25 Hydroxy (Vit-D Deficiency, Fractures)  Result Value Ref Range   VITD 44.68 30.00 - 100.00 ng/mL  Hemoglobin A1c  Result Value Ref Range   Hgb A1c MFr Bld 6.1 4.6 - 6.5 %  Hepatitis C antibody  Result Value Ref Range   HCV Ab NEGATIVE NEGATIVE     Patient Active Problem List   Diagnosis Date Noted  . Syncope 03/11/2015  . Colon cancer screening 12/24/2014  . Bee sting allergy 06/10/2014  . Obesity 06/10/2014  . Encounter for Medicare annual wellness exam 12/03/2013  . History of shingles 11/27/2013  . Hx of breast cancer 08/22/2011  . Gynecological examination 02/14/2011  . OSTEOARTHRITIS 04/23/2008  . Vitamin D deficiency 04/22/2008  . Hyperlipidemia 04/22/2008  . GERD 04/22/2008  . Hyperglycemia 04/22/2008   Past Medical History:  Diagnosis Date  . Breast cancer ([place])    DCIS; BRCA 1 pos HER2 pos, ER2 pos  . DDD (degenerative disc disease)   . GERD (gastroesophageal reflux disease)   . HLD (hyperlipidemia)    statin intolerant  . Hyperglycemia 9/09   A1C elvated to 6.2  . Osteoarthritis   . Osteopenia 2007   ? from xray with nl dexa  . Reactive depression (situational)    very well controlled, and now takes prozac for sleep  . Sjogren's syndrome ([place])   . Urine incontinence   . Vitamin D deficiency    Past Surgical History:  Procedure Laterality Date  . BILATERAL OOPHORECTOMY  205  . COLONOSCOPY  5/07   normal; recheck 3 years  . LUMBAR DISC SURGERY  1991   Ruptured disk L5  . MASTECTOMY  2001   bilateral  . TONSILLECTOMY  1974  . TOTAL HIP ARTHROPLASTY  2011  . TOTAL KNEE ARTHROPLASTY  2006   bilateral   Social History  Substance  Use Topics  . Smoking status: Never Smoker  . Smokeless tobacco: Never Used  . Alcohol use 0.0 oz/week     Comment: occassional wine    Family History  Problem Relation Age of Onset  . Arthritis      family hx  . Breast cancer      1st degree relative <50  . Diabetes      1st degree relative  . Hyperlipidemia      family hx  . Hypertension      family hx  . Ovarian cancer      family hx  . Prostate cancer      1st degree relative <50  . Stroke      family hx  . Dementia Mother   . Psoriasis Brother   . Other Daughter     BRCA gene  . Brain cancer Brother    Allergies  Allergen Reactions  . Bee Venom   . Statins     REACTION: joint pain   Current Outpatient Prescriptions on File Prior to Visit  Medication Sig Dispense Refill  . cetirizine (ZYRTEC) 5 MG tablet Take 5 mg by mouth daily.    . cycloSPORINE (RESTASIS) 0.05 % ophthalmic emulsion 1 drop as directed.      Marland Kitchen EPINEPHrine 0.3 mg/0.3 mL IJ SOAJ injection Inject 0.3 mLs (0.3 mg total) into the muscle once. 1 Device 5  . ezetimibe (ZETIA) 10 MG tablet Take 1 tablet (10 mg total) by mouth daily. 90 tablet 3  . Fish Oil-Cholecalciferol (OMEGA-3 + VITAMIN D3 PO) Take 1 capsule by mouth daily.    Marland Kitchen FLUoxetine (PROZAC) 20 MG capsule Take 1 capsule (20 mg total) by mouth daily. 90 capsule 3  . omeprazole (PRILOSEC) 20 MG capsule Take 20 mg by mouth daily.      Marland Kitchen oxybutynin (OXYTROL) 3.9 MG/24HR Place 1 patch onto the skin 2 (two) times a week. 24 patch 3  . pilocarpine (SALAGEN) 5 MG tablet Take 1 tablet (5 mg total) by mouth 3 (three) times daily. 270 tablet 3   No current facility-administered medications on file prior to visit.      Review of Systems Review of Systems  Constitutional: Negative for fever, appetite change, fatigue and unexpected weight change.  Eyes: Negative for pain and visual disturbance.  Respiratory: Negative for cough and shortness of breath.   Cardiovascular: Negative for cp or palpitations    Gastrointestinal: Negative for nausea, diarrhea and constipation.  Genitourinary: Negative for urgency and frequency.  Skin:  Negative for pallor or rash   Neurological: Negative for weakness, light-headedness, numbness and headaches.  Hematological: Negative for adenopathy. Does not bruise/bleed easily.  Psychiatric/Behavioral: Negative for dysphoric mood. The patient is not nervous/anxious.         Objective:   Physical Exam  Constitutional: She appears well-developed and well-nourished. No distress.  obese and well appearing   HENT:  Head: Normocephalic and atraumatic.  Right Ear: External ear normal.  Left Ear: External ear normal.  Nose: Nose normal.  Mouth/Throat: Oropharynx is clear and moist.  Eyes: Conjunctivae and EOM are normal. Pupils are equal, round, and reactive to light. Right eye exhibits no discharge. Left eye exhibits no discharge. No scleral icterus.  Neck: Normal range of motion. Neck supple. No JVD present. Carotid bruit is not present. No thyromegaly present.  Cardiovascular: Normal rate, regular rhythm, normal heart sounds and intact distal pulses.  Exam reveals no gallop.   Pulmonary/Chest: Effort normal and breath sounds  normal. No respiratory distress. She has no wheezes. She has no rales.  Abdominal: Soft. Bowel sounds are normal. She exhibits no distension and no mass. There is no tenderness.  Genitourinary:  Genitourinary Comments: S/p bilat mastectomy-no breast tissue  No lumps in either axillae   Musculoskeletal: She exhibits no edema or tenderness.  Lymphadenopathy:    She has no cervical adenopathy.  Neurological: She is alert. She has normal reflexes. No cranial nerve deficit. She exhibits normal muscle tone. Coordination normal.  Skin: Skin is warm and dry. No rash noted. No erythema. No pallor.  Some lentigines   Psychiatric: She has a normal mood and affect.          Assessment & Plan:   Problem List Items Addressed This Visit      Other   Hyperglycemia    Stable with diet control Lab Results  Component Value Date   HGBA1C 6.1 12/22/2015   Enc to inc  exercise for wt loss       Hyperlipidemia    On zetia alone-intol of statins Fair control Disc goals for lipids and reasons to control them Rev labs with pt Rev low sat fat diet in detail       Obesity    Discussed how this problem influences overall health and the risks it imposes  Reviewed plan for weight loss with lower calorie diet (via better food choices and also portion control or program like weight watchers) and exercise building up to or more than 30 minutes 5 days per week including some aerobic activity   Enc to inc exercise duration gradually       Vitamin D deficiency - Primary    Vitamin D level is therapeutic with current supplementation Disc importance of this to bone and overall health

## 2015-12-25 NOTE — Progress Notes (Signed)
Pre visit review using our clinic review tool, if applicable. No additional management support is needed unless otherwise documented below in the visit note. 

## 2015-12-25 NOTE — Patient Instructions (Addendum)
Walk regularly - increase duration as you can  Use bike on alternate days  Have your mail order let us know when other medicines are due  I sent celebrex to your [facility] - take it with food  Try to get 1200-1500 mg of calcium per day with at least 1000 iu of vitamin D - for bone health  Labs look good/stable  Take care of yourself

## 2015-12-27 NOTE — Assessment & Plan Note (Signed)
On zetia alone-intol of statins Fair control Disc goals for lipids and reasons to control them Rev labs with pt Rev low sat fat diet in detail

## 2015-12-27 NOTE — Assessment & Plan Note (Signed)
Vitamin D level is therapeutic with current supplementation Disc importance of this to bone and overall health  

## 2015-12-27 NOTE — Assessment & Plan Note (Signed)
Stable with diet control Lab Results  Component Value Date   HGBA1C 6.1 12/22/2015   Enc to inc exercise for wt loss

## 2015-12-27 NOTE — Assessment & Plan Note (Signed)
Discussed how this problem influences overall health and the risks it imposes  Reviewed plan for weight loss with lower calorie diet (via better food choices and also portion control or program like weight watchers) and exercise building up to or more than 30 minutes 5 days per week including some aerobic activity   Enc to inc exercise duration gradually

## 2016-01-14 ENCOUNTER — Other Ambulatory Visit: Payer: Self-pay

## 2016-01-25 ENCOUNTER — Other Ambulatory Visit: Payer: Self-pay | Admitting: Family Medicine

## 2016-03-03 DIAGNOSIS — D3131 Benign neoplasm of right choroid: Secondary | ICD-10-CM | POA: Diagnosis not present

## 2016-03-03 DIAGNOSIS — H04123 Dry eye syndrome of bilateral lacrimal glands: Secondary | ICD-10-CM | POA: Diagnosis not present

## 2016-04-19 DIAGNOSIS — H25813 Combined forms of age-related cataract, bilateral: Secondary | ICD-10-CM | POA: Diagnosis not present

## 2016-04-21 ENCOUNTER — Other Ambulatory Visit: Payer: Self-pay | Admitting: Family Medicine

## 2016-05-31 DIAGNOSIS — Z23 Encounter for immunization: Secondary | ICD-10-CM | POA: Diagnosis not present

## 2016-09-06 DIAGNOSIS — Z1212 Encounter for screening for malignant neoplasm of rectum: Secondary | ICD-10-CM | POA: Diagnosis not present

## 2016-09-06 DIAGNOSIS — Z1211 Encounter for screening for malignant neoplasm of colon: Secondary | ICD-10-CM | POA: Diagnosis not present

## 2016-09-07 LAB — COLOGUARD: Cologuard: NEGATIVE

## 2016-09-15 ENCOUNTER — Encounter: Payer: Self-pay | Admitting: *Deleted

## 2016-09-16 ENCOUNTER — Encounter: Payer: Self-pay | Admitting: Family Medicine

## 2016-09-16 DIAGNOSIS — R0683 Snoring: Secondary | ICD-10-CM

## 2016-09-16 DIAGNOSIS — R400 Somnolence: Secondary | ICD-10-CM

## 2016-09-18 DIAGNOSIS — R400 Somnolence: Secondary | ICD-10-CM | POA: Insufficient documentation

## 2016-09-18 DIAGNOSIS — G4733 Obstructive sleep apnea (adult) (pediatric): Secondary | ICD-10-CM | POA: Insufficient documentation

## 2016-09-18 NOTE — Telephone Encounter (Signed)
Referring to pulmonary sleep dept for poss sleep apnea Order done w/in mychart message

## 2016-09-29 DIAGNOSIS — H16223 Keratoconjunctivitis sicca, not specified as Sjogren's, bilateral: Secondary | ICD-10-CM | POA: Diagnosis not present

## 2016-10-06 ENCOUNTER — Ambulatory Visit (INDEPENDENT_AMBULATORY_CARE_PROVIDER_SITE_OTHER): Payer: Medicare Other | Admitting: Internal Medicine

## 2016-10-06 ENCOUNTER — Encounter: Payer: Self-pay | Admitting: Internal Medicine

## 2016-10-06 VITALS — BP 128/78 | HR 66 | Ht 67.5 in | Wt 262.0 lb

## 2016-10-06 DIAGNOSIS — G4719 Other hypersomnia: Secondary | ICD-10-CM | POA: Diagnosis not present

## 2016-10-06 DIAGNOSIS — R739 Hyperglycemia, unspecified: Secondary | ICD-10-CM | POA: Diagnosis not present

## 2016-10-06 NOTE — Patient Instructions (Signed)
  Sleep Apnea Sleep apnea is disorder that affects a person's sleep. A person with sleep apnea has abnormal pauses in their breathing when they sleep. It is hard for them to get a good sleep. This makes a person tired during the day. It also can lead to other physical problems. There are three types of sleep apnea. One type is when breathing stops for a short time because your airway is blocked (obstructive sleep apnea). Another type is when the brain sometimes fails to give the normal signal to breathe to the muscles that control your breathing (central sleep apnea). The third type is a combination of the other two types. HOME CARE   Take all medicine as told by your doctor.  Avoid alcohol, calming medicines (sedatives), and depressant drugs.  Try to lose weight if you are overweight. Talk to your doctor about a healthy weight goal.  Your doctor may have you use a device that helps to open your airway. It can help you get the air that you need. It is called a positive airway pressure (PAP) device.   MAKE SURE YOU:   Understand these instructions.  Will watch your condition.  Will get help right away if you are not doing well or get worse.  It may take approximately 1 month for you to get used to wearing her CPAP every night.  Be sure to work with your machine to get used to it, be patient, it may take time!

## 2016-10-06 NOTE — Progress Notes (Signed)
[facility] [place] [facility] Consultation      Assessment and Plan:  Excessive daytime sleepiness. -Snoring and witnessed apneas, symptoms and signs of obstructive sleep apnea. -We'll send for sleep study.  GERD, Sjogren's syndrome, hyperglycemia. -Sleep apnea can worsen, GERD, and hyperglycemia, therefore, it is important to treat the sleep apnea if present. The patient Sjogren's syndrome can cause dry, which may be exacerbated slightly by use of CPAP, which we discussed, we will monitor this.  Date: 10/06/2016  MRN# [id number] Lauren [name] December 13, 1945    Lauren [name] is a 71 y.o. old female seen in consultation for chief complaint of:    Chief Complaint  Patient presents with  . Advice Only    ref by Dr. Milinda Antis  . Sleeping Problem    snoring, daytime sleepiness, stops breathing in sleep per daughter    HPI:   She has been noticing that as she has gained about 70 lbs in past 7 years she has been told by her daughter that she snores loudly and stops breathing at night. She is sleepy during the day and takes naps for 2 to 3 hours per day, and falls asleep while reading to her grandkids. She does sleep walk but nothing dangerous.   She wakes up feeling tired and unrested. She usually goes to bed at 9 pm and is woken at 6:30 pm. She currently wears a mouthguard, though this does not help with snoring.  She had her tonsils taken out remotely. She has a history of sjogren's.    PMHX:   Past Medical History:  Diagnosis Date  . Breast cancer (HCC)    DCIS; BRCA 1 pos HER2 pos, ER2 pos  . DDD (degenerative disc disease)   . GERD (gastroesophageal reflux disease)   . HLD (hyperlipidemia)    statin intolerant  . Hyperglycemia 9/09   A1C elvated to 6.2  . Osteoarthritis   . Osteopenia 2007   ? from xray with nl dexa  . Reactive depression (situational)    very well controlled, and now takes prozac for sleep  . Sjogren's syndrome (HCC)   . Urine incontinence   . Vitamin  D deficiency    Surgical Hx:  Past Surgical History:  Procedure Laterality Date  . BILATERAL OOPHORECTOMY  205  . COLONOSCOPY  5/07   normal; recheck 3 years  . LUMBAR DISC SURGERY  1991   Ruptured disk L5  . MASTECTOMY  2001   bilateral  . TONSILLECTOMY  1974  . TOTAL HIP ARTHROPLASTY  2011  . TOTAL KNEE ARTHROPLASTY  2006   bilateral   Family Hx:  Family History  Problem Relation Age of Onset  . Arthritis Unknown        family hx  . Breast cancer Unknown        1st degree relative <50  . Diabetes Unknown        1st degree relative  . Hyperlipidemia Unknown        family hx  . Hypertension Unknown        family hx  . Ovarian cancer Unknown        family hx  . Prostate cancer Unknown        1st degree relative <50  . Stroke Unknown        family hx  . Dementia Mother   . Psoriasis Brother   . Other Daughter        BRCA gene  . Brain cancer Brother    Social  Hx:   Social History  Substance Use Topics  . Smoking status: Never Smoker  . Smokeless tobacco: Never Used  . Alcohol use 0.0 oz/week     Comment: occassional wine   Medication:    Current Outpatient Prescriptions:  .  celecoxib (CELEBREX) 200 MG capsule, TAKE ONE CAPSULE BY MOUTH TWICE DAILY, Disp: 90 capsule, Rfl: 2 .  cetirizine (ZYRTEC) 5 MG tablet, Take 5 mg by mouth daily., Disp: , Rfl:  .  EPINEPHrine 0.3 mg/0.3 mL IJ SOAJ injection, Inject 0.3 mLs (0.3 mg total) into the muscle once., Disp: 1 Device, Rfl: 5 .  ezetimibe (ZETIA) 10 MG tablet, TAKE 1 TABLET BY MOUTH DAILY, Disp: 90 tablet, Rfl: 2 .  Fish Oil-Cholecalciferol (OMEGA-3 + VITAMIN D3 PO), Take 2 capsules by mouth daily. , Disp: , Rfl:  .  FLUoxetine (PROZAC) 20 MG capsule, TAKE ONE CAPSULE BY MOUTH ONCE DAILY, Disp: 90 capsule, Rfl: 2 .  omeprazole (PRILOSEC) 20 MG capsule, Take 20 mg by mouth daily.  , Disp: , Rfl:  .  oxybutynin (OXYTROL) 3.9 MG/24HR, Place 1 patch onto the skin 2 (two) times a week., Disp: 24 patch, Rfl: 3 .   pilocarpine (SALAGEN) 5 MG tablet, TAKE 1 TABLET BY MOUTH THREE TIMES DAILY, Disp: 270 tablet, Rfl: 2   Allergies:  Bee venom and Statins  Review of Systems: Gen:  Denies  fever, sweats, chills HEENT: Denies blurred vision, double vision. bleeds, sore throat Cvc:  No dizziness, chest pain. Resp:   Denies cough or sputum production, shortness of breath Gi: Denies swallowing difficulty, stomach pain. Gu:  Denies bladder incontinence, burning urine Ext:   No Joint pain, stiffness. Skin: No skin rash,  hives  Endoc:  No polyuria, polydipsia. Psych: No depression, insomnia. Other:  All other systems were reviewed with the patient and were negative other that what is mentioned in the HPI.   Physical Examination:   VS: BP 128/78 (BP Location: Left Arm, Cuff Size: Normal)   Pulse 66   Ht 5' 7.5" (1.715 m)   Wt 262 lb (118.8 kg)   SpO2 97%   BMI 40.43 kg/m   General Appearance: No distress  Neuro:without focal findings,  speech normal,  HEENT: PERRLA, EOM intact.  Mallampati 2, no tonsils Pulmonary: normal breath sounds, No wheezing.  CardiovascularNormal S1,S2.  No m/r/g.   Abdomen: Benign, Soft, non-tender. Renal:  No costovertebral tenderness  GU:  No performed at this time. Endoc: No evident thyromegaly, no signs of acromegaly. Skin:   warm, no rashes, no ecchymosis  Extremities: normal, no cyanosis, clubbing.  Other findings:    LABORATORY PANEL:   CBC No results for input(s): WBC, HGB, HCT, PLT in the last 168 hours. ------------------------------------------------------------------------------------------------------------------  Chemistries  No results for input(s): NA, K, CL, CO2, GLUCOSE, BUN, CREATININE, CALCIUM, MG, AST, ALT, ALKPHOS, BILITOT in the last 168 hours.  Invalid input(s): GFRCGP ------------------------------------------------------------------------------------------------------------------  Cardiac Enzymes No results for input(s): TROPONINI in  the last 168 hours. ------------------------------------------------------------  RADIOLOGY:  No results found.     Thank  you for the consultation and for allowing [facility] to assist in the care of your patient. Our recommendations are noted above.  Please contact us if we can be of further service.   Marda Stalker, MD.  Board Certified in Internal Medicine, Pulmonary Medicine, [place], and Sleep Medicine.  [facility] Office Number: [phone]  Patricia Pesa, M.D.  Merton Border, M.D  10/06/2016 

## 2016-10-14 ENCOUNTER — Other Ambulatory Visit: Payer: Self-pay | Admitting: Family Medicine

## 2016-10-19 ENCOUNTER — Ambulatory Visit: Payer: Medicare Other | Attending: Family Medicine

## 2016-10-19 ENCOUNTER — Encounter: Payer: Self-pay | Admitting: Internal Medicine

## 2016-10-19 DIAGNOSIS — G4761 Periodic limb movement disorder: Secondary | ICD-10-CM | POA: Insufficient documentation

## 2016-10-19 DIAGNOSIS — G4719 Other hypersomnia: Secondary | ICD-10-CM

## 2016-10-19 DIAGNOSIS — G4733 Obstructive sleep apnea (adult) (pediatric): Secondary | ICD-10-CM | POA: Diagnosis not present

## 2016-10-19 DIAGNOSIS — R0683 Snoring: Secondary | ICD-10-CM | POA: Insufficient documentation

## 2016-10-20 DIAGNOSIS — G4733 Obstructive sleep apnea (adult) (pediatric): Secondary | ICD-10-CM | POA: Diagnosis not present

## 2016-10-21 ENCOUNTER — Telehealth: Payer: Self-pay | Admitting: *Deleted

## 2016-10-21 DIAGNOSIS — G4733 Obstructive sleep apnea (adult) (pediatric): Secondary | ICD-10-CM

## 2016-10-21 NOTE — Telephone Encounter (Signed)
Pt informed of sleep results. Order placed for CPAP titration. Nothing further needed.

## 2016-10-21 NOTE — Telephone Encounter (Signed)
-----   Message from Laverle Hobby, MD sent at 10/20/2016  5:32 PM EDT ----- Regarding: sleep study results.   Severe OSA with AHI of 76 (severe is greater than 30). .Periodic Limb Movement Disorder [G47.61] with arousal index of 5.9  Recommendations .Sleep study with CPAP titration without dental device.

## 2016-11-08 ENCOUNTER — Ambulatory Visit: Payer: Medicare Other | Attending: Internal Medicine

## 2016-11-08 DIAGNOSIS — G4761 Periodic limb movement disorder: Secondary | ICD-10-CM | POA: Diagnosis not present

## 2016-11-08 DIAGNOSIS — G4733 Obstructive sleep apnea (adult) (pediatric): Secondary | ICD-10-CM | POA: Diagnosis not present

## 2016-11-09 ENCOUNTER — Encounter: Payer: Self-pay | Admitting: Internal Medicine

## 2016-11-10 DIAGNOSIS — Z23 Encounter for immunization: Secondary | ICD-10-CM | POA: Diagnosis not present

## 2016-11-14 ENCOUNTER — Telehealth: Payer: Self-pay | Admitting: Internal Medicine

## 2016-11-14 NOTE — Telephone Encounter (Signed)
Patient says she had her 2nd sleep study and is anxious for results so she can get cpap.  Please call.

## 2016-11-14 NOTE — Telephone Encounter (Signed)
Pt made aware Dr. Ashby Dawes out of office and will read titration study. Orders will be placed at that time. Patient is going to [place] on 10/26 and is afraid to go without cpap machine. Patient would like rx to purchase equipment online.

## 2016-11-18 ENCOUNTER — Telehealth: Payer: Self-pay | Admitting: *Deleted

## 2016-11-18 DIAGNOSIS — G4733 Obstructive sleep apnea (adult) (pediatric): Secondary | ICD-10-CM | POA: Diagnosis not present

## 2016-11-18 NOTE — Telephone Encounter (Signed)
Pt is calling back regarding her sleep machine. States she needs a rx to Teacher, adult education. Pt states she is leaving to go out of the country on 10/26. She is interested in the travel one. Please call.

## 2016-11-18 NOTE — Telephone Encounter (Signed)
-----   Message from Laverle Hobby, MD sent at 11/18/2016 11:13 AM EDT ----- Regarding: CPAP titration results  Recommendations: F&P Simplus Full Face, Medium  Auto-CPAP with pressure range of 13-16  cmH2O

## 2016-11-18 NOTE — Telephone Encounter (Signed)
Patient aware of results. Orders entered. Nothing further needed.

## 2016-11-18 NOTE — Telephone Encounter (Signed)
Was planning to read yesterday but the power went out here yesterday; plan on reading today.

## 2016-11-18 NOTE — Telephone Encounter (Signed)
Pt would like a call back by 1 or 2 pm

## 2016-11-18 NOTE — Telephone Encounter (Signed)
Patient contacted, results given of sleep study. Orders placed for cpap machine. Nothing further needed.

## 2016-12-27 ENCOUNTER — Other Ambulatory Visit: Payer: Self-pay | Admitting: Family Medicine

## 2016-12-28 ENCOUNTER — Telehealth: Payer: Self-pay

## 2016-12-28 NOTE — Telephone Encounter (Signed)
error 

## 2017-01-01 ENCOUNTER — Telehealth: Payer: Self-pay | Admitting: Family Medicine

## 2017-01-01 DIAGNOSIS — R739 Hyperglycemia, unspecified: Secondary | ICD-10-CM

## 2017-01-01 DIAGNOSIS — Z0000 Encounter for general adult medical examination without abnormal findings: Secondary | ICD-10-CM | POA: Insufficient documentation

## 2017-01-01 DIAGNOSIS — E559 Vitamin D deficiency, unspecified: Secondary | ICD-10-CM

## 2017-01-01 DIAGNOSIS — E7800 Pure hypercholesterolemia, unspecified: Secondary | ICD-10-CM

## 2017-01-01 NOTE — Telephone Encounter (Signed)
-----   Message from Eustace Pen, LPN sent at 84/07/9859  5:03 PM EST ----- Regarding: Labs 11/28 Lab orders needed. Thank you.  Insurance:  [employer]

## 2017-01-04 ENCOUNTER — Ambulatory Visit (INDEPENDENT_AMBULATORY_CARE_PROVIDER_SITE_OTHER): Payer: Medicare Other

## 2017-01-04 VITALS — BP 130/86 | HR 62 | Temp 97.7°F | Ht 67.5 in | Wt 261.2 lb

## 2017-01-04 DIAGNOSIS — R739 Hyperglycemia, unspecified: Secondary | ICD-10-CM

## 2017-01-04 DIAGNOSIS — Z0000 Encounter for general adult medical examination without abnormal findings: Secondary | ICD-10-CM

## 2017-01-04 DIAGNOSIS — E7800 Pure hypercholesterolemia, unspecified: Secondary | ICD-10-CM | POA: Diagnosis not present

## 2017-01-04 DIAGNOSIS — E559 Vitamin D deficiency, unspecified: Secondary | ICD-10-CM

## 2017-01-04 LAB — CBC WITH DIFFERENTIAL/PLATELET
Basophils Absolute: 0.1 10*3/uL (ref 0.0–0.1)
Basophils Relative: 0.8 % (ref 0.0–3.0)
Eosinophils Absolute: 0.2 10*3/uL (ref 0.0–0.7)
Eosinophils Relative: 3.2 % (ref 0.0–5.0)
HCT: 42.7 % (ref 36.0–46.0)
Hemoglobin: 13.8 g/dL (ref 12.0–15.0)
Lymphocytes Relative: 15.6 % (ref 12.0–46.0)
Lymphs Abs: 1.2 10*3/uL (ref 0.7–4.0)
MCHC: 32.3 g/dL (ref 30.0–36.0)
MCV: 88.2 fl (ref 78.0–100.0)
Monocytes Absolute: 0.6 10*3/uL (ref 0.1–1.0)
Monocytes Relative: 7.2 % (ref 3.0–12.0)
Neutro Abs: 5.6 10*3/uL (ref 1.4–7.7)
Neutrophils Relative %: 73.2 % (ref 43.0–77.0)
Platelets: 215 10*3/uL (ref 150.0–400.0)
RBC: 4.85 Mil/uL (ref 3.87–5.11)
RDW: 14.7 % (ref 11.5–15.5)
WBC: 7.7 10*3/uL (ref 4.0–10.5)

## 2017-01-04 LAB — LIPID PANEL
Cholesterol: 175 mg/dL (ref 0–200)
HDL: 49.4 mg/dL (ref >39.00)
LDL Cholesterol: 110 mg/dL — ABNORMAL HIGH (ref 0–99)
NonHDL: 125.16
Total CHOL/HDL Ratio: 4
Triglycerides: 78 mg/dL (ref 0.0–149.0)
VLDL: 15.6 mg/dL (ref 0.0–40.0)

## 2017-01-04 LAB — HEMOGLOBIN A1C: Hgb A1c MFr Bld: 6.3 % (ref 4.6–6.5)

## 2017-01-04 LAB — COMPREHENSIVE METABOLIC PANEL
ALT: 16 U/L (ref 0–35)
AST: 17 U/L (ref 0–37)
Albumin: 4.1 g/dL (ref 3.5–5.2)
Alkaline Phosphatase: 70 U/L (ref 39–117)
BUN: 23 mg/dL (ref 6–23)
CO2: 25 mEq/L (ref 19–32)
Calcium: 9.3 mg/dL (ref 8.4–10.5)
Chloride: 103 mEq/L (ref 96–112)
Creatinine, Ser: 0.58 mg/dL (ref 0.40–1.20)
GFR: 108.7 mL/min (ref >60.00)
Glucose, Bld: 106 mg/dL — ABNORMAL HIGH (ref 70–99)
Potassium: 4.2 mEq/L (ref 3.5–5.1)
Sodium: 138 mEq/L (ref 135–145)
Total Bilirubin: 0.4 mg/dL (ref 0.2–1.2)
Total Protein: 7.2 g/dL (ref 6.0–8.3)

## 2017-01-04 LAB — TSH: TSH: 0.54 u[IU]/mL (ref 0.35–4.50)

## 2017-01-04 LAB — VITAMIN D 25 HYDROXY (VIT D DEFICIENCY, FRACTURES): VITD: 32.42 ng/mL (ref 30.00–100.00)

## 2017-01-04 NOTE — Progress Notes (Signed)
Pre visit review using our clinic review tool, if applicable. No additional management support is needed unless otherwise documented below in the visit note. 

## 2017-01-04 NOTE — Patient Instructions (Signed)
Ms. Matuska , Thank you for taking time to come for your Medicare Wellness Visit. I appreciate your ongoing commitment to your health goals. Please review the following plan we discussed and let me know if I can assist you in the future.   These are the goals we discussed: Goals    . Increase physical activity     Starting 01/04/2017, I will continue to walk at least 20 min daily.        This is a list of the screening recommended for you and due dates:  Health Maintenance  Topic Date Due  . Mammogram  03/11/2020*  . Pap Smear  01/05/2024*  . Pneumonia vaccines (2 of 2 - PPSV23) 05/31/2017  . Cologuard (Stool DNA test)  09/08/2019  . Tetanus Vaccine  07/08/2025  . Flu Shot  Completed  . DEXA scan (bone density measurement)  Completed  .  Hepatitis C: One time screening is recommended by Center for Disease Control  (CDC) for  adults born from [birth year] through 1965.   Completed  *Topic was postponed. The date shown is not the original due date.   Preventive Care for Adults  A healthy lifestyle and preventive care can promote health and wellness. Preventive health guidelines for adults include the following key practices.  . A routine yearly physical is a good way to check with your health care provider about your health and preventive screening. It is a chance to share any concerns and updates on your health and to receive a thorough exam.  . Visit your dentist for a routine exam and preventive care every 6 months. Brush your teeth twice a day and floss once a day. Good oral hygiene prevents tooth decay and gum disease.  . The frequency of eye exams is based on your age, health, family medical history, use  of contact lenses, and other factors. Follow your health care provider's recommendations for frequency of eye exams.  . Eat a healthy diet. Foods like vegetables, fruits, whole grains, low-fat dairy products, and lean protein foods contain the nutrients you need without too many  calories. Decrease your intake of foods high in solid fats, added sugars, and salt. Eat the right amount of calories for you. Get information about a proper diet from your health care provider, if necessary.  . Regular physical exercise is one of the most important things you can do for your health. Most adults should get at least 150 minutes of moderate-intensity exercise (any activity that increases your heart rate and causes you to sweat) each week. In addition, most adults need muscle-strengthening exercises on 2 or more days a week.  Silver Sneakers may be a benefit available to you. To determine eligibility, you may visit the website: www.silversneakers.com or contact program at [phone])152-4611 Mon-Fri between 8AM-8PM.   . Maintain a healthy weight. The body mass index (BMI) is a screening tool to identify possible weight problems. It provides an estimate of body fat based on height and weight. Your health care provider can find your BMI and can help you achieve or maintain a healthy weight.   For adults 20 years and older: ? A BMI below 18.5 is considered underweight. ? A BMI of 18.5 to 24.9 is normal. ? A BMI of 25 to 29.9 is considered overweight. ? A BMI of 30 and above is considered obese.   . Maintain normal blood lipids and cholesterol levels by exercising and minimizing your intake of saturated fat. Eat a balanced diet with  plenty of fruit and vegetables. Blood tests for lipids and cholesterol should begin at age 81 and be repeated every 5 years. If your lipid or cholesterol levels are high, you are over 50, or you are at high risk for heart disease, you may need your cholesterol levels checked more frequently. Ongoing high lipid and cholesterol levels should be treated with medicines if diet and exercise are not working.  . If you smoke, find out from your health care provider how to quit. If you do not use tobacco, please do not start.  . If you choose to drink alcohol, please do  not consume more than 2 drinks per day. One drink is considered to be 12 ounces (355 mL) of beer, 5 ounces (148 mL) of wine, or 1.5 ounces (44 mL) of liquor.  . If you are 68-39 years old, ask your health care provider if you should take aspirin to prevent strokes.  . Use sunscreen. Apply sunscreen liberally and repeatedly throughout the day. You should seek shade when your shadow is shorter than you. Protect yourself by wearing long sleeves, pants, a wide-brimmed hat, and sunglasses year round, whenever you are outdoors.  . Once a month, do a whole body skin exam, using a mirror to look at the skin on your back. Tell your health care provider of new moles, moles that have irregular borders, moles that are larger than a pencil eraser, or moles that have changed in shape or color.

## 2017-01-04 NOTE — Progress Notes (Signed)
Subjective:   Lauren [name] is a 71 y.o. female who presents for Medicare Annual (Subsequent) preventive examination.  Review of Systems:  N/A Cardiac Risk Factors include: advanced age (>81mn, >>65women);obesity (BMI >30kg/m2);dyslipidemia     Objective:     Vitals: BP 130/86 (BP Location: Right Arm, Patient Position: Sitting, Cuff Size: Large)   Pulse 62   Temp 97.7 F (36.5 C) (Oral)   Ht 5' 7.5" (1.715 m) Comment: no shoes  Wt 261 lb 4 oz (118.5 kg)   SpO2 97%   BMI 40.31 kg/m   Body mass index is 40.31 kg/m.   Tobacco Social History   Tobacco Use  Smoking Status Never Smoker  Smokeless Tobacco Never Used     Counseling given: No   Past Medical History:  Diagnosis Date  . Breast cancer ([facility]    DCIS; BRCA 1 pos HER2 pos, ER2 pos  . DDD (degenerative disc disease)   . GERD (gastroesophageal reflux disease)   . HLD (hyperlipidemia)    statin intolerant  . Hyperglycemia 9/09   A1C elvated to 6.2  . Osteoarthritis   . Osteopenia 2007   ? from xray with nl dexa  . Reactive depression (situational)    very well controlled, and now takes prozac for sleep  . Sjogren's syndrome ([facility]   . Urine incontinence   . Vitamin D deficiency    Past Surgical History:  Procedure Laterality Date  . BILATERAL OOPHORECTOMY  205  . COLONOSCOPY  5/07   normal; recheck 3 years  . LUMBAR DISC SURGERY  1991   Ruptured disk L5  . MASTECTOMY  2001   bilateral  . TONSILLECTOMY  1974  . TOTAL HIP ARTHROPLASTY  2011  . TOTAL KNEE ARTHROPLASTY  2006   bilateral   Family History  Problem Relation Age of Onset  . Arthritis Unknown        family hx  . Breast cancer Unknown        1st degree relative <50  . Diabetes Unknown        1st degree relative  . Hyperlipidemia Unknown        family hx  . Hypertension Unknown        family hx  . Ovarian cancer Unknown        family hx  . Prostate cancer Unknown        1st degree relative <50  . Stroke Unknown        family  hx  . Dementia Mother   . Psoriasis Brother   . Other Daughter        BRCA gene  . Brain cancer Brother    Social History   Substance and Sexual Activity  Sexual Activity Not Currently    Outpatient Encounter Medications as of 01/04/2017  Medication Sig  . celecoxib (CELEBREX) 200 MG capsule TAKE ONE CAPSULE BY MOUTH TWICE DAILY (Patient taking differently: TAKE ONE CAPSULE BY MOUTH DAILY)  . cetirizine (ZYRTEC) 5 MG tablet Take 5 mg by mouth daily.  .Marland KitchenEPINEPHrine 0.3 mg/0.3 mL IJ SOAJ injection Inject 0.3 mLs (0.3 mg total) into the muscle once.  . ezetimibe (ZETIA) 10 MG tablet TAKE 1 TABLET BY MOUTH DAILY  . Fish Oil-Cholecalciferol (OMEGA-3 + VITAMIN D3 PO) Take 2 capsules by mouth daily.   .Marland KitchenFLUoxetine (PROZAC) 20 MG capsule TAKE ONE CAPSULE BY MOUTH ONCE DAILY  . omeprazole (PRILOSEC) 20 MG capsule Take 20 mg by mouth daily.    .Marland Kitchen  oxybutynin (OXYTROL) 3.9 MG/24HR Place 1 patch onto the skin 2 (two) times a week.  . pilocarpine (SALAGEN) 5 MG tablet TAKE 1 TABLET BY MOUTH THREE TIMES DAILY   No facility-administered encounter medications on file as of 01/04/2017.     Activities of Daily Living In your present state of health, do you have any difficulty performing the following activities: 01/04/2017  Hearing? N  Vision? N  Difficulty concentrating or making decisions? N  Walking or climbing stairs? N  Dressing or bathing? N  Doing errands, shopping? N  Preparing Food and eating ? N  Using the Toilet? N  In the past six months, have you accidently leaked urine? Y  Do you have problems with loss of bowel control? N  Managing your Medications? N  Managing your Finances? N  Housekeeping or managing your Housekeeping? N  Some recent data might be hidden    Patient Care Team: Tower, Wynelle Fanny, MD as PCP - General Sharol Roussel, OD as Physician Assistant (Optometry) Lorelee Cover., MD as Referring Physician (Ophthalmology) Laverle Hobby, MD as Consulting  Physician (Pulmonary Disease)    Assessment:     Hearing Screening   _0  _1  _2  _3  _4  _5  _6  _7  _8   Right ear:   40 40 40  0    Left ear:   40 40 40  0    Vision Screening Comments: Last vision exam in Summer 2018 with Dr. Gloriann Loan   Exercise Activities and Dietary recommendations Current Exercise Habits: Home exercise routine, Type of exercise: walking, Time (Minutes): 20, Frequency (Times/Week): 7, Weekly Exercise (Minutes/Week): 140, Intensity: Mild, Exercise limited by: None identified  Goals    . Increase physical activity     Starting 01/04/2017, I will continue to walk at least 20 min daily.       Fall Risk Fall Risk  01/04/2017 01/14/2016 12/22/2015 12/24/2014 12/03/2013  Falls in the past year? _9   Comment - Emmi Telephone Survey: data to providers prior to load - - -  Number falls in past yr: - - - - -  Injury with Fall? - - - - -   Depression Screen PHQ 2/9 Scores 01/04/2017 12/22/2015 12/24/2014 12/03/2013  PHQ - 2 Score 0 0 0 0  PHQ- 9 Score 0 - - -     Cognitive Function MMSE - Mini Mental State Exam 01/04/2017 12/22/2015  Orientation to time 5 5  Orientation to Place 5 5  Registration 3 3  Attention/ Calculation 0 0  Recall 3 3  Language- name 2 objects 0 0  Language- repeat 1 1  Language- follow 3 step command 3 3  Language- read & follow direction 0 0  Write a sentence 0 0  Copy design 0 0  Total score 20 20     PLEASE NOTE: A Mini-Cog screen was completed. Maximum score is 20. A value of 0 denotes this part of Folstein MMSE was not completed or the patient failed this part of the Mini-Cog screening.   Mini-Cog Screening Orientation to Time - Max 5 pts Orientation to Place - Max 5 pts Registration - Max 3 pts Recall - Max 3 pts Language Repeat - Max 1 pts Language Follow 3 Step Command - Max 3 pts     Immunization History  Administered Date(s) Administered  . HPV Bivalent 11/10/2016  . Influenza Split  11/26/2010  . Influenza Whole 12/06/2009  . Influenza, Seasonal, Injecte, Preservative Fre 10/23/2015  . Influenza,inj,Quad PF,6+  Mos 11/26/2013, 11/10/2014, 11/10/2016  . Pneumococcal Conjugate-13 12/24/2014, 05/31/2016  . Pneumococcal Polysaccharide-23 01/19/2010  . Td 10/13/2006  . Zoster 02/07/2005  . Zoster Recombinat (Shingrix) 05/31/2016, 08/23/2016   Screening Tests Health Maintenance  Topic Date Due  . MAMMOGRAM  03/11/2020 (Originally 04/16/1995)  . PAP SMEAR  01/05/2024 (Originally 02/14/2012)  . PNA vac Low Risk Adult (2 of 2 - PPSV23) 05/31/2017  . Fecal DNA (Cologuard)  09/08/2019  . TETANUS/TDAP  07/08/2025  . INFLUENZA VACCINE  Completed  . DEXA SCAN  Completed  . Hepatitis C Screening  Completed      Plan:     I have personally reviewed, addressed, and noted the following in the patient's chart:  A. Medical and social history B. Use of alcohol, tobacco or illicit drugs  C. Current medications and supplements D. Functional ability and status E.  Nutritional status F.  Physical activity G. Advance directives H. List of other physicians I.  Hospitalizations, surgeries, and ER visits in previous 12 months J.  [place] to include hearing, vision, cognitive, depression L. Referrals and appointments - none  In addition, I have reviewed and discussed with patient certain preventive protocols, quality metrics, and best practice recommendations. A written personalized care plan for preventive services as well as general preventive health recommendations were provided to patient.  See attached scanned questionnaire for additional information.   Signed,   Lindell Noe, MHA, BS, LPN Health Coach

## 2017-01-04 NOTE — Progress Notes (Signed)
PCP notes:   Health maintenance:  No gaps identified.  Abnormal screenings:   Hearing - failed  Hearing Screening   125Hz  250Hz  500Hz  1000Hz  2000Hz  3000Hz  4000Hz  6000Hz  8000Hz   Right ear:   40 40 40  0    Left ear:   40 40 40  0     Patient concerns:   None  Nurse concerns:  None  Next PCP appt:   01/09/17 @ 0930

## 2017-01-05 NOTE — Progress Notes (Signed)
I reviewed health advisor's note, was available for consultation, and agree with documentation and plan.  

## 2017-01-09 ENCOUNTER — Ambulatory Visit (INDEPENDENT_AMBULATORY_CARE_PROVIDER_SITE_OTHER): Payer: Medicare Other | Admitting: Family Medicine

## 2017-01-09 ENCOUNTER — Encounter: Payer: Self-pay | Admitting: Family Medicine

## 2017-01-09 VITALS — BP 118/72 | HR 58 | Temp 98.3°F | Ht 67.5 in | Wt 264.5 lb

## 2017-01-09 DIAGNOSIS — Z1211 Encounter for screening for malignant neoplasm of colon: Secondary | ICD-10-CM

## 2017-01-09 DIAGNOSIS — E2839 Other primary ovarian failure: Secondary | ICD-10-CM

## 2017-01-09 DIAGNOSIS — E559 Vitamin D deficiency, unspecified: Secondary | ICD-10-CM | POA: Diagnosis not present

## 2017-01-09 DIAGNOSIS — E7800 Pure hypercholesterolemia, unspecified: Secondary | ICD-10-CM

## 2017-01-09 DIAGNOSIS — R739 Hyperglycemia, unspecified: Secondary | ICD-10-CM | POA: Diagnosis not present

## 2017-01-09 DIAGNOSIS — Z853 Personal history of malignant neoplasm of breast: Secondary | ICD-10-CM

## 2017-01-09 DIAGNOSIS — G4733 Obstructive sleep apnea (adult) (pediatric): Secondary | ICD-10-CM

## 2017-01-09 MED ORDER — FLUOXETINE HCL 20 MG PO CAPS: 20.0000 mg | ORAL_CAPSULE | Freq: Every day | ORAL | 3 refills | Status: DC

## 2017-01-09 MED ORDER — PILOCARPINE HCL 5 MG PO TABS: 5.0000 mg | ORAL_TABLET | Freq: Three times a day (TID) | ORAL | 3 refills | Status: DC

## 2017-01-09 MED ORDER — CELECOXIB 200 MG PO CAPS: 200.0000 mg | ORAL_CAPSULE | Freq: Every day | ORAL | 3 refills | Status: DC

## 2017-01-09 MED ORDER — EZETIMIBE 10 MG PO TABS: 10.0000 mg | ORAL_TABLET | Freq: Every day | ORAL | 3 refills | Status: DC

## 2017-01-09 NOTE — Assessment & Plan Note (Signed)
Nl cologuard 8/18

## 2017-01-09 NOTE — Assessment & Plan Note (Signed)
Disc goals for lipids and reasons to control them Rev labs with pt Rev low sat fat diet in detail Stable with zetia and diet Intol of statins

## 2017-01-09 NOTE — Assessment & Plan Note (Signed)
Ref for dexa  Disc prev OP No falls or fx  Hx of anti est tx in the past

## 2017-01-09 NOTE — Patient Instructions (Addendum)
Try to get most of your carbohydrates from produce (with the exception of white potatoes)  Eat less bread/pasta/rice/snack foods/cereals/sweets and other items from the middle of the grocery store (processed carbs)  We will refer you for a bone density test  Increase vit D to 2000 iu daily   Take care of yourself and stay active !

## 2017-01-09 NOTE — Assessment & Plan Note (Signed)
Doing much better with cpap

## 2017-01-09 NOTE — Assessment & Plan Note (Signed)
No new developments-no mammogram due to double mastectomy  Had oophrectomy due to BRCA 1  Hx of anti est tx and chemo  No recurrences

## 2017-01-09 NOTE — Progress Notes (Signed)
Subjective:    Patient ID: Lauren [name], female    DOB: Jun 21, 1945, 71 y.o.   MRN: [id number]  HPI Here for annual f/u of chronic health problems  Feeling good  Doing better after cpap machine for 2 weeks!  - feels good Took a long time to get it    Had amw on 11/28 Missed 4000 Hz on hearing screen bilat - tinnitis per pt /will need hearing aides eventually  No gaps identified  Wt Readings from Last 3 Encounters:  01/09/17 264 lb 8 oz (120 kg)  01/04/17 261 lb 4 oz (118.5 kg)  10/06/16 262 lb (118.8 kg)  will now be much more active with her tx of sleep apnea  Diet is fair/too much sugar - has to be ready to give it up  40.82 kg/m  Breast cancer screening - she has had breast cancer and mastectomy  BRCA 1 gene Self breast exam-no change in chest wall    Gyn-bilat oophrectomy No vaginal bleeding   cologuard nl 8/18  dexa 11/10 normal D level is 32.4 this check -she is taking vit D No falls or fx  Was on anti est tx in the past for breast cancer  Is open to dexa this year   zostavax 1/07 then shingrix completed in 2018   Hyperglycemia Lab Results  Component Value Date   HGBA1C 6.3 01/04/2017   Up from 6.1 Plans on going cold turkey-giving up sugar   Hyperlipidemia Lab Results  Component Value Date   CHOL 175 01/04/2017   CHOL 174 12/22/2015   CHOL 169 12/22/2014   Lab Results  Component Value Date   HDL 49.40 01/04/2017   HDL 50.40 12/22/2015   HDL 46.30 12/22/2014   Lab Results  Component Value Date   LDLCALC 110 (H) 01/04/2017   LDLCALC 102 (H) 12/22/2015   LDLCALC 104 (H) 12/22/2014   Lab Results  Component Value Date   TRIG 78.0 01/04/2017   TRIG 106.0 12/22/2015   TRIG 94.0 12/22/2014   Lab Results  Component Value Date   CHOLHDL 4 01/04/2017   CHOLHDL 3 12/22/2015   CHOLHDL 4 12/22/2014   Lab Results  Component Value Date   LDLDIRECT 148.9 08/20/2012   LDLDIRECT 184.7 02/11/2009  on zetia Does not tolerate statins  Overall  pretty stable   Other labs Results for orders placed or performed in visit on 01/04/17  VITAMIN D 25 Hydroxy (Vit-D Deficiency, Fractures)  Result Value Ref Range   VITD 32.42 30.00 - 100.00 ng/mL  TSH  Result Value Ref Range   TSH 0.54 0.35 - 4.50 uIU/mL  Lipid panel  Result Value Ref Range   Cholesterol 175 0 - 200 mg/dL   Triglycerides 78.0 0.0 - 149.0 mg/dL   HDL 49.40 >39.00 mg/dL   VLDL 15.6 0.0 - 40.0 mg/dL   LDL Cholesterol 110 (H) 0 - 99 mg/dL   Total CHOL/HDL Ratio 4    NonHDL 125.16   Hemoglobin A1c  Result Value Ref Range   Hgb A1c MFr Bld 6.3 4.6 - 6.5 %  Comprehensive metabolic panel  Result Value Ref Range   Sodium 138 135 - 145 mEq/L   Potassium 4.2 3.5 - 5.1 mEq/L   Chloride 103 96 - 112 mEq/L   CO2 25 19 - 32 mEq/L   Glucose, Bld 106 (H) 70 - 99 mg/dL   BUN 23 6 - 23 mg/dL   Creatinine, Ser 0.58 0.40 - 1.20 mg/dL   Total Bilirubin  0.4 0.2 - 1.2 mg/dL   Alkaline Phosphatase 70 39 - 117 U/L   AST 17 0 - 37 U/L   ALT 16 0 - 35 U/L   Total Protein 7.2 6.0 - 8.3 g/dL   Albumin 4.1 3.5 - 5.2 g/dL   Calcium 9.3 8.4 - 10.5 mg/dL   GFR 108.70 >60.00 mL/min  CBC with Differential/Platelet  Result Value Ref Range   WBC 7.7 4.0 - 10.5 K/uL   RBC 4.85 3.87 - 5.11 Mil/uL   Hemoglobin 13.8 12.0 - 15.0 g/dL   HCT 42.7 36.0 - 46.0 %   MCV 88.2 78.0 - 100.0 fl   MCHC 32.3 30.0 - 36.0 g/dL   RDW 14.7 11.5 - 15.5 %   Platelets 215.0 150.0 - 400.0 K/uL   Neutrophils Relative % 73.2 43.0 - 77.0 %   Lymphocytes Relative 15.6 12.0 - 46.0 %   Monocytes Relative 7.2 3.0 - 12.0 %   Eosinophils Relative 3.2 0.0 - 5.0 %   Basophils Relative 0.8 0.0 - 3.0 %   Neutro Abs 5.6 1.4 - 7.7 K/uL   Lymphs Abs 1.2 0.7 - 4.0 K/uL   Monocytes Absolute 0.6 0.1 - 1.0 K/uL   Eosinophils Absolute 0.2 0.0 - 0.7 K/uL   Basophils Absolute 0.1 0.0 - 0.1 K/uL     Patient Active Problem List   Diagnosis Date Noted  . Estrogen deficiency 01/09/2017  . Routine general medical examination  at a health care facility 01/01/2017  . Snoring 09/18/2016  . Somnolence, daytime 09/18/2016  . Colon cancer screening 12/24/2014  . Bee sting allergy 06/10/2014  . Morbid obesity ([place]) 06/10/2014  . Encounter for Medicare annual wellness exam 12/03/2013  . History of shingles 11/27/2013  . Hx of breast cancer 08/22/2011  . Gynecological examination 02/14/2011  . OSTEOARTHRITIS 04/23/2008  . Vitamin D deficiency 04/22/2008  . Hyperlipidemia 04/22/2008  . GERD 04/22/2008  . Hyperglycemia 04/22/2008   Past Medical History:  Diagnosis Date  . Breast cancer ([place])    DCIS; BRCA 1 pos HER2 pos, ER2 pos  . DDD (degenerative disc disease)   . GERD (gastroesophageal reflux disease)   . HLD (hyperlipidemia)    statin intolerant  . Hyperglycemia 9/09   A1C elvated to 6.2  . Osteoarthritis   . Osteopenia 2007   ? from xray with nl dexa  . Reactive depression ([place])    very well controlled, and now takes prozac for sleep  . Sjogren's syndrome ([place])   . Urine incontinence   . Vitamin D deficiency    Past Surgical History:  Procedure Laterality Date  . BILATERAL OOPHORECTOMY  205  . COLONOSCOPY  5/07   normal; recheck 3 years  . LUMBAR DISC SURGERY  1991   Ruptured disk L5  . MASTECTOMY  2001   bilateral  . TONSILLECTOMY  1974  . TOTAL HIP ARTHROPLASTY  2011  . TOTAL KNEE ARTHROPLASTY  2006   bilateral   Social History   Tobacco Use  . Smoking status: Never Smoker  . Smokeless tobacco: Never Used  Substance Use Topics  . Alcohol use: Yes    Alcohol/week: 0.0 oz    Comment: occassional wine  . Drug use: No   Family History  Problem Relation Age of Onset  . Arthritis Unknown        family hx  . Breast cancer Unknown        1st degree relative <50  . Diabetes Unknown  1st degree relative  . Hyperlipidemia Unknown        family hx  . Hypertension Unknown        family hx  . Ovarian cancer Unknown        family hx  . Prostate cancer Unknown         1st degree relative <50  . Stroke Unknown        family hx  . Dementia Mother   . Psoriasis Brother   . Other Daughter        BRCA gene  . Brain cancer Brother    Allergies  Allergen Reactions  . Bee Venom   . Statins     REACTION: joint pain   Current Outpatient Medications on File Prior to Visit  Medication Sig Dispense Refill  . cholecalciferol (VITAMIN D) 1000 units tablet Take 1,000 Units by mouth daily.    . celecoxib (CELEBREX) 200 MG capsule TAKE ONE CAPSULE BY MOUTH TWICE DAILY (Patient taking differently: TAKE ONE CAPSULE BY MOUTH DAILY) 180 capsule 0  . cetirizine (ZYRTEC) 5 MG tablet Take 5 mg by mouth daily.    Marland Kitchen EPINEPHrine 0.3 mg/0.3 mL IJ SOAJ injection Inject 0.3 mLs (0.3 mg total) into the muscle once. 1 Device 5  . ezetimibe (ZETIA) 10 MG tablet TAKE 1 TABLET BY MOUTH DAILY 90 tablet 2  . Fish Oil-Cholecalciferol (OMEGA-3 + VITAMIN D3 PO) Take 2 capsules by mouth daily.     Marland Kitchen FLUoxetine (PROZAC) 20 MG capsule TAKE ONE CAPSULE BY MOUTH ONCE DAILY 90 capsule 0  . omeprazole (PRILOSEC) 20 MG capsule Take 20 mg by mouth daily.      Marland Kitchen oxybutynin (OXYTROL) 3.9 MG/24HR Place 1 patch onto the skin 2 (two) times a week. 24 patch 3  . pilocarpine (SALAGEN) 5 MG tablet TAKE 1 TABLET BY MOUTH THREE TIMES DAILY 270 tablet 2   No current facility-administered medications on file prior to visit.     Review of Systems  Constitutional: Negative for activity change, appetite change, fatigue, fever and unexpected weight change.  HENT: Negative for congestion, ear pain, rhinorrhea, sinus pressure and sore throat.   Eyes: Negative for pain, redness and visual disturbance.  Respiratory: Negative for cough, shortness of breath and wheezing.   Cardiovascular: Negative for chest pain and palpitations.  Gastrointestinal: Negative for abdominal pain, blood in stool, constipation and diarrhea.  Endocrine: Negative for polydipsia and polyuria.  Genitourinary: Negative for dysuria,  frequency and urgency.  Musculoskeletal: Negative for arthralgias, back pain and myalgias.  Skin: Negative for pallor and rash.  Allergic/Immunologic: Negative for environmental allergies.  Neurological: Negative for dizziness, syncope and headaches.  Hematological: Negative for adenopathy. Does not bruise/bleed easily.  Psychiatric/Behavioral: Negative for decreased concentration and dysphoric mood. The patient is not nervous/anxious.        Objective:   Physical Exam  Constitutional: She appears well-developed and well-nourished. No distress.  HENT:  Head: Normocephalic and atraumatic.  Right Ear: External ear normal.  Left Ear: External ear normal.  Mouth/Throat: Oropharynx is clear and moist.  Eyes: Conjunctivae and EOM are normal. Pupils are equal, round, and reactive to light. No scleral icterus.  Neck: Normal range of motion. Neck supple. No JVD present. Carotid bruit is not present. No thyromegaly present.  Cardiovascular: Normal rate, regular rhythm, normal heart sounds and intact distal pulses. Exam reveals no gallop.  Pulmonary/Chest: Effort normal and breath sounds normal. No respiratory distress. She has no wheezes. She exhibits no tenderness.  Abdominal:  Soft. Bowel sounds are normal. She exhibits no distension, no abdominal bruit and no mass. There is no tenderness.  Genitourinary:  Genitourinary Comments: No M or tenderness in chest wall or axillae s/p double mastectomy  Musculoskeletal: Normal range of motion. She exhibits no edema or tenderness.  No kyphosis   Lymphadenopathy:    She has no cervical adenopathy.  Neurological: She is alert. She has normal reflexes. No cranial nerve deficit. She exhibits normal muscle tone. Coordination normal.  Skin: Skin is warm and dry. No rash noted. No erythema. No pallor.  Solar lentigines diffusely   Psychiatric: She has a normal mood and affect.          Assessment & Plan:   Problem List Items Addressed This Visit        Respiratory   OSA (obstructive sleep apnea)    Doing much better with cpap        Other   Colon cancer screening    Nl cologuard 8/18      Estrogen deficiency    Ref for dexa  Disc prev OP No falls or fx  Hx of anti est tx in the past      Relevant Orders   DG Bone Density   Hx of breast cancer - Primary    No new developments-no mammogram due to double mastectomy  Had oophrectomy due to BRCA 1  Hx of anti est tx and chemo  No recurrences      Hyperglycemia    Lab Results  Component Value Date   HGBA1C 6.3 01/04/2017   With morbid obes Pt plans on cutting out sugar disc imp of low glycemic diet and wt loss to prevent DM2 Handouts given  With tx of OSA-more energy to exercise       Hyperlipidemia    Disc goals for lipids and reasons to control them Rev labs with pt Rev low sat fat diet in detail Stable with zetia and diet Intol of statins      Relevant Medications   ezetimibe (ZETIA) 10 MG tablet   Morbid obesity ([place])    Discussed how this problem influences overall health and the risks it imposes  Reviewed plan for weight loss with lower calorie diet (via better food choices and also portion control or program like weight watchers) and exercise building up to or more than 30 minutes 5 days per week including some aerobic activity         Vitamin D deficiency    Level in 30s Disc imp to bone and overall health  Will inc to 2000 iu daily

## 2017-01-09 NOTE — Assessment & Plan Note (Signed)
Discussed how this problem influences overall health and the risks it imposes  Reviewed plan for weight loss with lower calorie diet (via better food choices and also portion control or program like weight watchers) and exercise building up to or more than 30 minutes 5 days per week including some aerobic activity    

## 2017-01-09 NOTE — Assessment & Plan Note (Signed)
Level in 30s Disc imp to bone and overall health  Will inc to 2000 iu daily

## 2017-01-09 NOTE — Assessment & Plan Note (Signed)
Lab Results  Component Value Date   HGBA1C 6.3 01/04/2017   With morbid obes Pt plans on cutting out sugar disc imp of low glycemic diet and wt loss to prevent DM2 Handouts given  With tx of OSA-more energy to exercise

## 2017-01-17 ENCOUNTER — Encounter: Payer: Self-pay | Admitting: Internal Medicine

## 2017-01-26 ENCOUNTER — Other Ambulatory Visit: Payer: Self-pay | Admitting: Family Medicine

## 2017-02-07 NOTE — Progress Notes (Signed)
[facility] Consultation      Assessment and Plan:  Sleep apnea.  --Continue using your CPAP every night.   GERD, Sjogren's syndrome, hyperglycemia. -Sleep apnea can worsen, GERD, and hyperglycemia, therefore, it is important to treat the sleep apnea if present. The patient Sjogren's syndrome can cause dry, which may be exacerbated slightly by use of CPAP, which we discussed, we will monitor this.  Date: 02/07/2017  MRN# [id number] Lauren [name] Sep 17, 1945    Lauren [name] is a 72 y.o. old female seen in consultation for chief complaint of:    Chief Complaint  Patient presents with  . Sleep Apnea    Pt states she is doing well. She wears cpap daily at least 8 hrs. She doesn't have any concerns.    HPI:   The patient returns for follow up after a recent sleep study which showed AHI of 76 and PLMS with arousal index of 5.9. She was recommended to start auto-PAP with pressure range of 13-16.  She goes to bed around 10 pm and keeps it on until she wakes at 6 am. She used to wake several times which she no longer does, she is more awake during the day.  She no longer naps during the day, she tells me that she "feels like a new person".  Severe OSA with AHI of 76 (severe is greater than 30). .Periodic Limb Movement Disorder [G47.61] with arousal index of 5.9  .Sleep study with CPAP titration without dental device.  F&P Simplus Full Face, Medium  Auto-CPAP with pressure range of 13-16  cmH2O  Social Hx:   Social History   Tobacco Use  . Smoking status: Never Smoker  . Smokeless tobacco: Never Used  Substance Use Topics  . Alcohol use: Yes    Alcohol/week: 0.0 oz    Comment: occassional wine  . Drug use: No   Medication:    Current Outpatient Medications:  .  celecoxib (CELEBREX) 200 MG capsule, Take 1 capsule (200 mg total) by mouth daily., Disp: 90 capsule, Rfl: 3 .  cetirizine (ZYRTEC) 5 MG tablet, Take 5 mg by mouth daily., Disp: , Rfl:  .   cholecalciferol (VITAMIN D) 1000 units tablet, Take 1,000 Units by mouth daily., Disp: , Rfl:  .  EPINEPHrine 0.3 mg/0.3 mL IJ SOAJ injection, Inject 0.3 mLs (0.3 mg total) into the muscle once., Disp: 1 Device, Rfl: 5 .  ezetimibe (ZETIA) 10 MG tablet, TAKE 1 TABLET BY MOUTH DAILY, Disp: 90 tablet, Rfl: 3 .  Fish Oil-Cholecalciferol (OMEGA-3 + VITAMIN D3 PO), Take 2 capsules by mouth daily. , Disp: , Rfl:  .  FLUoxetine (PROZAC) 20 MG capsule, Take 1 capsule (20 mg total) by mouth daily., Disp: 90 capsule, Rfl: 3 .  omeprazole (PRILOSEC) 20 MG capsule, Take 20 mg by mouth daily.  , Disp: , Rfl:  .  oxybutynin (OXYTROL) 3.9 MG/24HR, Place 1 patch onto the skin 2 (two) times a week., Disp: 24 patch, Rfl: 3 .  pilocarpine (SALAGEN) 5 MG tablet, Take 1 tablet (5 mg total) by mouth 3 (three) times daily., Disp: 270 tablet, Rfl: 3   Allergies:  Bee venom and Statins  Review of Systems: Gen:  Denies  fever, sweats, chills HEENT: Denies blurred vision, double vision. bleeds, sore throat Cvc:  No dizziness, chest pain. Resp:   Denies cough or sputum production, shortness of breath Gi: Denies swallowing difficulty, stomach pain. Gu:  Denies bladder incontinence, burning urine Ext:   No  Joint pain, stiffness. Skin: No skin rash,  hives  Endoc:  No polyuria, polydipsia. Psych: No depression, insomnia. Other:  All other systems were reviewed with the patient and were negative other that what is mentioned in the HPI.   Physical Examination:   VS: Ht 5' 7.6" (1.717 m)   BMI 40.69 kg/m   General Appearance: No distress  Neuro:without focal findings,  speech normal,  HEENT: PERRLA, EOM intact.  Mallampati 2, no tonsils Pulmonary: normal breath sounds, No wheezing.  CardiovascularNormal S1,S2.  No m/r/g.   Abdomen: Benign, Soft, non-tender. Renal:  No costovertebral tenderness  GU:  No performed at this time. Endoc: No evident thyromegaly, no signs of acromegaly. Skin:   warm, no rashes, no  ecchymosis  Extremities: normal, no cyanosis, clubbing.  Other findings:    LABORATORY PANEL:   CBC No results for input(s): WBC, HGB, HCT, PLT in the last 168 hours. ------------------------------------------------------------------------------------------------------------------  Chemistries  No results for input(s): NA, K, CL, CO2, GLUCOSE, BUN, CREATININE, CALCIUM, MG, AST, ALT, ALKPHOS, BILITOT in the last 168 hours.  Invalid input(s): GFRCGP ------------------------------------------------------------------------------------------------------------------  Cardiac Enzymes No results for input(s): TROPONINI in the last 168 hours. ------------------------------------------------------------  RADIOLOGY:  No results found.     Thank  you for the consultation and for allowing [facility] to assist in the care of your patient. Our recommendations are noted above.  Please contact us if we can be of further service.   Marda Stalker, MD.  Board Certified in Internal Medicine, Pulmonary Medicine, [place], and Sleep Medicine.  [facility] Office Number: [phone]  Patricia Pesa, M.D.  Merton Border, M.D  02/07/2017

## 2017-02-08 ENCOUNTER — Encounter: Payer: Self-pay | Admitting: Internal Medicine

## 2017-02-09 ENCOUNTER — Encounter: Payer: Self-pay | Admitting: Internal Medicine

## 2017-02-09 ENCOUNTER — Ambulatory Visit (INDEPENDENT_AMBULATORY_CARE_PROVIDER_SITE_OTHER): Payer: Medicare Other | Admitting: Internal Medicine

## 2017-02-09 VITALS — Ht 67.6 in

## 2017-02-09 DIAGNOSIS — G4733 Obstructive sleep apnea (adult) (pediatric): Secondary | ICD-10-CM | POA: Diagnosis not present

## 2017-02-09 NOTE — Patient Instructions (Signed)
Doing great! Continue using your cpap every night!

## 2017-02-20 ENCOUNTER — Ambulatory Visit
Admission: RE | Admit: 2017-02-20 | Discharge: 2017-02-20 | Disposition: A | Discharge status: Home / Self Care | Payer: Medicare Other | Source: Ambulatory Visit | Attending: Family Medicine | Admitting: Family Medicine

## 2017-02-20 DIAGNOSIS — Z853 Personal history of malignant neoplasm of breast: Secondary | ICD-10-CM | POA: Diagnosis not present

## 2017-02-20 DIAGNOSIS — E2839 Other primary ovarian failure: Secondary | ICD-10-CM | POA: Diagnosis not present

## 2017-02-20 DIAGNOSIS — Z780 Asymptomatic menopausal state: Secondary | ICD-10-CM | POA: Diagnosis not present

## 2017-02-20 DIAGNOSIS — Z13820 Encounter for screening for osteoporosis: Secondary | ICD-10-CM | POA: Diagnosis not present

## 2017-03-21 ENCOUNTER — Other Ambulatory Visit: Payer: Self-pay | Admitting: Family Medicine

## 2017-04-07 ENCOUNTER — Telehealth: Payer: Self-pay

## 2017-04-07 NOTE — Telephone Encounter (Signed)
Copied from [place] [phone]. Topic: General - Other >> Apr 07, 2017  9:07 AM Scherrie Gerlach wrote: Reason for CRM: pt would like Tarlesia to call her back.  Pt has met with her and has some questions about her diet.

## 2017-04-07 NOTE — Telephone Encounter (Signed)
Patient desired to know if she can continue on Keto diet. Presented question to PCP. PCP's dietary advice included discontinuing Keto diet, obtaining good carbs from produce, and to avoid carbs from processed foods.   Patient was contacted and given this dietary advice. Patient verbalized understanding and compliance.

## 2017-04-19 ENCOUNTER — Telehealth: Payer: Self-pay | Admitting: *Deleted

## 2017-04-19 NOTE — Telephone Encounter (Signed)
Received fax from [facility] saying:  "increased risk of GI bleed with Celebrex and Prozac, please review and let us know how to handle"  Please advise

## 2017-04-20 NOTE — Telephone Encounter (Signed)
It is low- recommendation is to use caution  I would not ordinarily change anything but let me know if she does

## 2017-04-20 NOTE — Telephone Encounter (Signed)
Left VM letting pharmacy know Dr. Marliss Coots comments and to advise pt of the caution

## 2017-04-28 DIAGNOSIS — H43813 Vitreous degeneration, bilateral: Secondary | ICD-10-CM | POA: Diagnosis not present

## 2017-05-25 DIAGNOSIS — H43812 Vitreous degeneration, left eye: Secondary | ICD-10-CM | POA: Diagnosis not present

## 2017-08-08 ENCOUNTER — Other Ambulatory Visit: Payer: Self-pay | Admitting: Family Medicine

## 2017-08-08 NOTE — Telephone Encounter (Signed)
Not due yet Rx declined

## 2017-09-04 DIAGNOSIS — I781 Nevus, non-neoplastic: Secondary | ICD-10-CM | POA: Diagnosis not present

## 2017-10-16 ENCOUNTER — Encounter (INDEPENDENT_AMBULATORY_CARE_PROVIDER_SITE_OTHER): Payer: Medicare Other | Admitting: Ophthalmology

## 2017-10-16 DIAGNOSIS — H43813 Vitreous degeneration, bilateral: Secondary | ICD-10-CM | POA: Diagnosis not present

## 2017-10-16 DIAGNOSIS — D3131 Benign neoplasm of right choroid: Secondary | ICD-10-CM

## 2017-10-16 DIAGNOSIS — H2513 Age-related nuclear cataract, bilateral: Secondary | ICD-10-CM | POA: Diagnosis not present

## 2017-11-04 ENCOUNTER — Ambulatory Visit (HOSPITAL_COMMUNITY)
Admission: EM | Admit: 2017-11-04 | Discharge: 2017-11-04 | Disposition: A | Discharge status: Home / Self Care | Payer: Medicare Other | Attending: Family Medicine | Admitting: Family Medicine

## 2017-11-04 ENCOUNTER — Other Ambulatory Visit: Payer: Self-pay

## 2017-11-04 ENCOUNTER — Encounter (HOSPITAL_COMMUNITY): Payer: Self-pay

## 2017-11-04 DIAGNOSIS — B029 Zoster without complications: Secondary | ICD-10-CM | POA: Diagnosis not present

## 2017-11-04 DIAGNOSIS — J0100 Acute maxillary sinusitis, unspecified: Secondary | ICD-10-CM

## 2017-11-04 MED ORDER — AMOXICILLIN-POT CLAVULANATE 875-125 MG PO TABS: 1.0000 | ORAL_TABLET | Freq: Two times a day (BID) | ORAL | 0 refills | Status: DC

## 2017-11-04 MED ORDER — VALACYCLOVIR HCL 1 G PO TABS: 1000.0000 mg | ORAL_TABLET | Freq: Three times a day (TID) | ORAL | 0 refills | Status: DC

## 2017-11-04 NOTE — ED Notes (Signed)
Bed: UC01 Expected date:  Expected time:  Means of arrival:  Comments: 

## 2017-11-04 NOTE — ED Triage Notes (Signed)
Pt has a small rash on her forehead x 3 day. Pt has a cold and cough.

## 2017-11-06 ENCOUNTER — Telehealth: Payer: Self-pay

## 2017-11-06 NOTE — Telephone Encounter (Signed)
PLEASE NOTE: All timestamps contained within this report are represented as [place] Standard Time. CONFIDENTIALTY NOTICE: This fax transmission is intended only for the addressee. It contains information that is legally privileged, confidential or otherwise protected from use or disclosure. If you are not the intended recipient, you are strictly prohibited from reviewing, disclosing, copying using or disseminating any of this information or taking any action in reliance on or regarding this information. If you have received this fax in error, please notify us immediately by telephone so that we can arrange for its return to [place]. Phone: [phone], Toll-Free: [phone], Fax: [phone] Page: 1 of 2 Call Id: [id number] [name] Patient Name: Lauren [name] Gender: Female DOB: 08/08/1945 Age: 72 Y 3 M 19 D Return Phone Number: [phone] (Primary), [phone] (Secondary) Address[place] [address] Client [name], Roque Lias - MD Contact Type Call Who Is Calling Patient / Member / Family / Caregiver Call Type Triage / Clinical Relationship To Patient Self Return Phone Number [phone] (Primary) Chief Complaint Skin Lesion - Moles/ Lumps/ Growths Reason for Call Request to Schedule Office Appointment Initial Comment Caller states that she has a thing on her forehead. She has had shingles before and is worried it is shingles. On Thursday there were little dots in a circle and was bleeding. Now it is blistered up and has white dots around it. She is wanting to know where she could go to be seen Translation No Nurse Assessment Nurse: Matthew Folks, RN, Hannelore Date/Time (Eastern Time): 11/04/2017 10:14:11 AM Confirm and document reason for call. If  symptomatic, describe symptoms. ---Caller states this rash started out as a circle on her forehead. Now it's blistered up and has little pus dots in it. She has had shingles before. This started on Thursday and now it's full blown. She has already made an appt at the [facility]. Does the patient have any new or worsening symptoms? ---Yes Will a triage be completed? ---Yes Related visit to physician within the last 2 weeks? ---No Does the PT have any chronic conditions? (i.e. diabetes, asthma, this includes High risk factors for pregnancy, etc.) ---No Is this a behavioral health or substance abuse call? ---No Guidelines Guideline Title Affirmed Question Affirmed Notes Nurse Date/Time (Eastern Time) Impetigo (Infected Sore) [1] Red spreading area around 1 sore AND [2] fever Shook-Minyard, RN, Hannelore 11/04/2017 10:19:43 AM Disp. Time [name] Time) Disposition Final User 11/04/2017 10:23:08 AM See HCP within 4 Hours (or PCP triage) Yes Shook-Minyard, RN, Hannelore PLEASE NOTE: All timestamps contained within this report are represented as [place] Standard Time. CONFIDENTIALTY NOTICE: This fax transmission is intended only for the addressee. It contains information that is legally privileged, confidential or otherwise protected from use or disclosure. If you are not the intended recipient, you are strictly prohibited from reviewing, disclosing, copying using or disseminating any of this information or taking any action in reliance on or regarding this information. If you have received this fax in error, please notify us immediately by telephone so that we can arrange for its return to [place]. Phone: [phone], Toll-Free: [phone], Fax: [phone] Page: 2 of 2 Call Id: [id number] [name] Disagree/Comply Comply Caller Understands Yes PreDisposition Go to Urgent Care/Walk-In Clinic Care Advice Given Per Guideline SEE HCP WITHIN 4 HOURS (OR PCP TRIAGE): * IF OFFICE WILL BE CLOSED AND NO PCP  (  PRIMARY CARE PROVIDER) SECOND-LEVEL TRIAGE: You need to be seen within the next 3 or 4 hours. A nearby Urgent Care Center [facility]) is often a good source of care. Another choice is to go to the ED. Go sooner if you become worse. CARE ADVICE given per [place]igo (Adult) guideline. * You become worse. CALL BACK IF: Referrals [facility]

## 2017-11-06 NOTE — Telephone Encounter (Signed)
Per chart review tab pt seen at [facility] on 11/04/17.

## 2017-11-14 NOTE — ED Provider Notes (Signed)
[place]   [id number] 11/04/17 Arrival Time: [place]:  1. Herpes zoster without complication   2. Acute non-recurrent maxillary sinusitis     Meds ordered this encounter  Medications  . valACYclovir (VALTREX) 1000 MG tablet    Sig: Take 1 tablet (1,000 mg total) by mouth 3 (three) times daily.    Dispense:  21 tablet    Refill:  0  . amoxicillin-clavulanate (AUGMENTIN) 875-125 MG tablet    Sig: Take 1 tablet by mouth every 12 (twelve) hours.    Dispense:  20 tablet    Refill:  0   Will follow up with PCP or here if worsening or failing to improve as anticipated. Reviewed expectations re: course of current medical issues. Questions answered. Outlined signs and symptoms indicating need for more acute intervention. Patient verbalized understanding. After Visit Summary given.   SUBJECTIVE:  Lauren [name] is a 72 y.o. female who presents with a skin complaint.   Location: L forehead Onset: gradual Duration: 3 days Associated pruritis? none Associated pain? Very mild Progression: stable  Drainage? No  Known trigger? No  New soaps/lotions/topicals/detergents/environmental exposures? No Contacts with similar? No Recent travel? No  Other associated symptoms: none Therapies tried thus far: none Arthralgia or myalgia? none Recent illness? none Fever? none No specific aggravating or alleviating factors reported.  Also reports sinus congestion over the past 3 weeks with mild cold symptoms. Now with maxillary sinus pressure/pain. Afebrile. No n/v. No significant respiratory symptoms. No OTC treatment. H/O occasional sinusitis. No seasonal allergies.  ROS: As per HPI.  OBJECTIVE: Vitals:   11/04/17 1423 11/04/17 1425  BP:  (!) 165/95  Pulse:  62  Resp:  18  Temp:  98.3 F (36.8 C)  TempSrc:  Oral  SpO2:  98%  Weight: 112.9 kg     General appearance: alert; no distress Lungs: clear to auscultation bilaterally Heart: regular rate and  rhythm Extremities: no edema Skin: warm and dry; signs of infection: no; a few small crops of red/purplish papules over her L medial forehead; mildly tender; no crusting; no drainage; minimal erythema Psychological: alert and cooperative; normal mood and affect  Allergies  Allergen Reactions  . Bee Venom   . Statins     REACTION: joint pain    Past Medical History:  Diagnosis Date  . Breast cancer ([place])    DCIS; BRCA 1 pos HER2 pos, ER2 pos  . DDD (degenerative disc disease)   . GERD (gastroesophageal reflux disease)   . HLD (hyperlipidemia)    statin intolerant  . Hyperglycemia 9/09   A1C elvated to 6.2  . Osteoarthritis   . Osteopenia 2007   ? from xray with nl dexa  . Reactive depression (situational)    very well controlled, and now takes prozac for sleep  . Sjogren's syndrome ([place])   . Urine incontinence   . Vitamin D deficiency    Social History   Socioeconomic History  . Marital status: Widowed    Spouse name: Not on file  . Number of children: 3  . Years of education: Not on file  . Highest education level: Not on file  Occupational History  . Occupation: Retired  Scientific laboratory technician  . Financial resource strain: Not on file  . Food insecurity:    Worry: Not on file    Inability: Not on file  . Transportation needs:    Medical: Not on file    Non-medical: Not on file  Tobacco Use  .  Smoking status: Never Smoker  . Smokeless tobacco: Never Used  Substance and Sexual Activity  . Alcohol use: Yes    Alcohol/week: 0.0 standard drinks    Comment: occassional wine  . Drug use: No  . Sexual activity: Not Currently  Lifestyle  . Physical activity:    Days per week: Not on file    Minutes per session: Not on file  . Stress: Not on file  Relationships  . Social connections:    Talks on phone: Not on file    Gets together: Not on file    Attends religious service: Not on file    Active member of club or organization: Not on file    Attends meetings of clubs  or organizations: Not on file    Relationship status: Not on file  . Intimate partner violence:    Fear of current or ex partner: Not on file    Emotionally abused: Not on file    Physically abused: Not on file    Forced sexual activity: Not on file  Other Topics Concern  . 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 Arthritis Unknown        family hx  . Breast cancer Unknown        1st degree relative <50  . Diabetes Unknown        1st degree relative  . Hyperlipidemia Unknown        family hx  . Hypertension Unknown        family hx  . Ovarian cancer Unknown        family hx  . Prostate cancer Unknown        1st degree relative <50  . Stroke Unknown        family hx  . Dementia Mother   . Psoriasis Brother   . Other Daughter        BRCA gene  . Brain cancer Brother    Past Surgical History:  Procedure Laterality Date  . BILATERAL OOPHORECTOMY  205  . COLONOSCOPY  5/07   normal; recheck 3 years  . LUMBAR DISC SURGERY  1991   Ruptured disk L5  . MASTECTOMY  2001   bilateral  . TONSILLECTOMY  1974  . TOTAL HIP ARTHROPLASTY  2011  . TOTAL KNEE ARTHROPLASTY  2006   bilateral     Vanessa Kick, MD 11/14/17 1052

## 2017-11-22 ENCOUNTER — Encounter: Payer: Self-pay | Admitting: Family Medicine

## 2017-11-22 ENCOUNTER — Ambulatory Visit (INDEPENDENT_AMBULATORY_CARE_PROVIDER_SITE_OTHER): Payer: Medicare Other | Admitting: Family Medicine

## 2017-11-22 VITALS — BP 122/76 | HR 62 | Temp 98.0°F | Ht 67.5 in | Wt 243.0 lb

## 2017-11-22 DIAGNOSIS — Z8619 Personal history of other infectious and parasitic diseases: Secondary | ICD-10-CM | POA: Diagnosis not present

## 2017-11-22 DIAGNOSIS — B373 Candidiasis of vulva and vagina: Secondary | ICD-10-CM

## 2017-11-22 DIAGNOSIS — B3731 Acute candidiasis of vulva and vagina: Secondary | ICD-10-CM | POA: Insufficient documentation

## 2017-11-22 DIAGNOSIS — B372 Candidiasis of skin and nail: Secondary | ICD-10-CM | POA: Diagnosis not present

## 2017-11-22 DIAGNOSIS — Z23 Encounter for immunization: Secondary | ICD-10-CM

## 2017-11-22 MED ORDER — FLUCONAZOLE 150 MG PO TABS: ORAL_TABLET | ORAL | 0 refills | Status: DC

## 2017-11-22 MED ORDER — VALACYCLOVIR HCL 500 MG PO TABS: 500.0000 mg | ORAL_TABLET | Freq: Two times a day (BID) | ORAL | 0 refills | Status: DC

## 2017-11-22 MED ORDER — NYSTATIN 100000 UNIT/GM EX CREA: 1.0000 "application " | TOPICAL_CREAM | Freq: Two times a day (BID) | CUTANEOUS | 0 refills | Status: DC

## 2017-11-22 NOTE — Assessment & Plan Note (Signed)
Pt has had numerous cases of "shingles" despite being vaccinated Some mild- ? If last event on forehead was actually HSV instead of zoster (it improved quickly with valtrex)  She is traveling soon to [place] and wants a prn px Written for valtrex 500 mg bid for 7d #14 no ref- for use prn for any vesicular breakout if needed  Will continue to follow

## 2017-11-22 NOTE — Patient Instructions (Signed)
Take a diflucan pill once every 3 days for 3 doses   Also try the nystatin cream on chest to see if this also helps   Keep areas clean and dry as well   Update if not starting to improve in a week or if worsening

## 2017-11-22 NOTE — Assessment & Plan Note (Signed)
In mastectomy sites bilat-small areas Occurred after abx  Also has vaginal yeast infection  Will tx with diflucan 150 po every 3 days for 3 doses Also nystatin cream for chest rash Keep clean and dry Update if not starting to improve in a week or if worsening

## 2017-11-22 NOTE — Assessment & Plan Note (Signed)
Persistent since abx tx for uri  Has used vinegar and yogurt-some improvement  (she does not tolerate antifungal creams in vaginal area but has used vagasil (plain) to soothe skin Px diflucan 150 po - 1 every 3 d for 3 doses  Avoid harsh detergents  Update if not starting to improve in a week or if worsening

## 2017-11-22 NOTE — Progress Notes (Signed)
Subjective:    Patient ID: Lauren [name], female    DOB: 1945/11/02, 72 y.o.   MRN: [id number]  HPI  Here for redness in mastectomy areas  In sept -had viral uri  Took mucinex DM Had to take abx  Then came vaginal yeast - she used vinegar and yogurt  Not totally gone  Internal and external  Itch/ burn No vag d/c   Now has some red spots on mastectomy sites   Then a spot came up on her L forehead-then turned into shingles  (she had shingrix-so surprised)  Per pt- not that bad and looked more like a cold sore than anything  Not actually sure if it was HSV or zoster (has had more than once)     Has a trip planned to [place]  Would like to take px for valtrex with her in case she gets an outbreak  Wt Readings from Last 3 Encounters:  11/22/17 243 lb (110.2 kg)  11/04/17 249 lb (112.9 kg)  01/09/17 264 lb 8 oz (120 kg)   37.50 kg/m   Patient Active Problem List   Diagnosis Date Noted  . Yeast infection of the skin 11/22/2017  . Yeast vaginitis 11/22/2017  . H/O herpes zoster 11/22/2017  . Estrogen deficiency 01/09/2017  . Routine general medical examination at a health care facility 01/01/2017  . OSA (obstructive sleep apnea) 09/18/2016  . Somnolence, daytime 09/18/2016  . Colon cancer screening 12/24/2014  . Bee sting allergy 06/10/2014  . Morbid obesity ([place]) 06/10/2014  . Encounter for Medicare annual wellness exam 12/03/2013  . History of shingles 11/27/2013  . Hx of breast cancer 08/22/2011  . Gynecological examination 02/14/2011  . Bilateral dry eyes 01/20/2011  . OSTEOARTHRITIS 04/23/2008  . Vitamin D deficiency 04/22/2008  . Hyperlipidemia 04/22/2008  . GERD 04/22/2008  . Hyperglycemia 04/22/2008   Past Medical History:  Diagnosis Date  . Breast cancer ([place])    DCIS; BRCA 1 pos HER2 pos, ER2 pos  . DDD (degenerative disc disease)   . GERD (gastroesophageal reflux disease)   . HLD (hyperlipidemia)    statin intolerant  . Hyperglycemia 9/09   A1C elvated to 6.2  . Osteoarthritis   . Osteopenia 2007   ? from xray with nl dexa  . Reactive depression (situational)    very well controlled, and now takes prozac for sleep  . Sjogren's syndrome ([place])   . Urine incontinence   . Vitamin D deficiency    Past Surgical History:  Procedure Laterality Date  . BILATERAL OOPHORECTOMY  205  . COLONOSCOPY  5/07   normal; recheck 3 years  . LUMBAR DISC SURGERY  1991   Ruptured disk L5  . MASTECTOMY  2001   bilateral  . TONSILLECTOMY  1974  . TOTAL HIP ARTHROPLASTY  2011  . TOTAL KNEE ARTHROPLASTY  2006   bilateral   Social History   Tobacco Use  . Smoking status: Never Smoker  . Smokeless tobacco: Never Used  Substance Use Topics  . Alcohol use: Yes    Alcohol/week: 0.0 standard drinks    Comment: occassional wine  . Drug use: No   Family History  Problem Relation Age of Onset  . Arthritis Unknown        family hx  . Breast cancer Unknown        1st degree relative <50  . Diabetes Unknown        1st degree relative  . Hyperlipidemia Unknown  family hx  . Hypertension Unknown        family hx  . Ovarian cancer Unknown        family hx  . Prostate cancer Unknown        1st degree relative <50  . Stroke Unknown        family hx  . Dementia Mother   . Psoriasis Brother   . Other Daughter        BRCA gene  . Brain cancer Brother    Allergies  Allergen Reactions  . Bee Venom   . Statins     REACTION: joint pain   Current Outpatient Medications on File Prior to Visit  Medication Sig Dispense Refill  . celecoxib (CELEBREX) 200 MG capsule Take 1 capsule (200 mg total) by mouth daily. 90 capsule 3  . cetirizine (ZYRTEC) 5 MG tablet Take 5 mg by mouth daily.    . cholecalciferol (VITAMIN D) 1000 units tablet Take 1,000 Units by mouth daily.    Marland Kitchen EPINEPHrine 0.3 mg/0.3 mL IJ SOAJ injection Inject 0.3 mLs (0.3 mg total) into the muscle once. 1 Device 5  . ezetimibe (ZETIA) 10 MG tablet TAKE 1 TABLET BY MOUTH  DAILY 90 tablet 3  . Fish Oil-Cholecalciferol (OMEGA-3 + VITAMIN D3 PO) Take 2 capsules by mouth daily.     Marland Kitchen FLUoxetine (PROZAC) 20 MG capsule Take 1 capsule (20 mg total) by mouth daily. 90 capsule 3  . omeprazole (PRILOSEC) 20 MG capsule Take 20 mg by mouth daily.      Marland Kitchen oxybutynin (OXYTROL) 3.9 MG/24HR Place 1 patch onto the skin 2 (two) times a week. 24 patch 3  . pilocarpine (SALAGEN) 5 MG tablet TAKE 1 TABLET BY MOUTH THREE TIMES DAILY 270 tablet 2   No current facility-administered medications on file prior to visit.     Review of Systems  Constitutional: Negative for activity change, appetite change, fatigue, fever and unexpected weight change.  HENT: Negative for congestion, ear pain, rhinorrhea, sinus pressure and sore throat.   Eyes: Negative for pain, redness and visual disturbance.  Respiratory: Negative for cough, shortness of breath and wheezing.   Cardiovascular: Negative for chest pain and palpitations.  Gastrointestinal: Negative for abdominal pain, blood in stool, constipation and diarrhea.  Endocrine: Negative for polydipsia and polyuria.  Genitourinary: Negative for dysuria, frequency, urgency and vaginal discharge.       Vaginal itching and burning   Musculoskeletal: Negative for arthralgias, back pain and myalgias.  Skin: Positive for rash. Negative for pallor.  Allergic/Immunologic: Negative for environmental allergies.  Neurological: Negative for dizziness, syncope and headaches.  Hematological: Negative for adenopathy. Does not bruise/bleed easily.  Psychiatric/Behavioral: Negative for decreased concentration and dysphoric mood. The patient is not nervous/anxious.        Objective:   Physical Exam  Constitutional: She appears well-developed and well-nourished. No distress.  obese and well appearing   HENT:  Head: Normocephalic and atraumatic.  Mouth/Throat: Oropharynx is clear and moist.  Eyes: Pupils are equal, round, and reactive to light. Conjunctivae  and EOM are normal. Right eye exhibits no discharge. Left eye exhibits no discharge. No scleral icterus.  Neck: Normal range of motion. Neck supple.  Cardiovascular: Normal rate, regular rhythm and normal heart sounds.  Pulmonary/Chest: Effort normal and breath sounds normal. No respiratory distress. She has no wheezes.  Abdominal: She exhibits no distension.  Genitourinary:  Genitourinary Comments: No lumps or tenderness in mastectomy scar areas   Musculoskeletal: She exhibits no  edema.  Lymphadenopathy:    She has no cervical adenopathy.  Neurological: She is alert.  Skin: Skin is warm and dry. Rash noted.  Small (3-4 cm) areas of erythema with sharp borders on mastectomy scar areas bilat No satellite lesions  Small (1 cm) bump on L forehead at site of recent rash (rash is resolved)   Psychiatric: She has a normal mood and affect.          Assessment & Plan:   Problem List Items Addressed This Visit      Musculoskeletal and Integument   Yeast infection of the skin - Primary    In mastectomy sites bilat-small areas Occurred after abx  Also has vaginal yeast infection  Will tx with diflucan 150 po every 3 days for 3 doses Also nystatin cream for chest rash Keep clean and dry Update if not starting to improve in a week or if worsening        Relevant Medications   valACYclovir (VALTREX) 500 MG tablet   fluconazole (DIFLUCAN) 150 MG tablet   nystatin cream (MYCOSTATIN)     Genitourinary   Yeast vaginitis    Persistent since abx tx for uri  Has used vinegar and yogurt-some improvement  (she does not tolerate antifungal creams in vaginal area but has used vagasil (plain) to soothe skin Px diflucan 150 po - 1 every 3 d for 3 doses  Avoid harsh detergents  Update if not starting to improve in a week or if worsening        Relevant Medications   valACYclovir (VALTREX) 500 MG tablet   fluconazole (DIFLUCAN) 150 MG tablet   nystatin cream (MYCOSTATIN)     Other   H/O  herpes zoster    Pt has had numerous cases of "shingles" despite being vaccinated Some mild- ? If last event on forehead was actually HSV instead of zoster (it improved quickly with valtrex)  She is traveling soon to [place] and wants a prn px Written for valtrex 500 mg bid for 7d #14 no ref- for use prn for any vesicular breakout if needed  Will continue to follow        Other Visit Diagnoses    Need for influenza vaccination       Relevant Orders   Flu Vaccine QUAD 6+ mos PF IM (Fluarix Quad PF) (Completed)

## 2018-01-02 ENCOUNTER — Other Ambulatory Visit: Payer: Self-pay | Admitting: Family Medicine

## 2018-01-03 ENCOUNTER — Ambulatory Visit: Payer: Commercial Indemnity

## 2018-01-08 DIAGNOSIS — Z23 Encounter for immunization: Secondary | ICD-10-CM | POA: Diagnosis not present

## 2018-01-10 ENCOUNTER — Telehealth: Payer: Self-pay | Admitting: Family Medicine

## 2018-01-10 DIAGNOSIS — E559 Vitamin D deficiency, unspecified: Secondary | ICD-10-CM

## 2018-01-10 DIAGNOSIS — R739 Hyperglycemia, unspecified: Secondary | ICD-10-CM

## 2018-01-10 DIAGNOSIS — E7800 Pure hypercholesterolemia, unspecified: Secondary | ICD-10-CM

## 2018-01-10 DIAGNOSIS — Z0000 Encounter for general adult medical examination without abnormal findings: Secondary | ICD-10-CM

## 2018-01-10 NOTE — Telephone Encounter (Signed)
Please let pt know that cbc and tsh may not be covered by her medicare but I usually order them for annual exams  Thanks

## 2018-01-10 NOTE — Telephone Encounter (Signed)
-----   Message from Eustace Pen, LPN sent at [phone]  3:51 PM EST ----- Regarding: Labs 12/5 Lab orders needed. Thank you.  Insurance:  [employer]

## 2018-01-11 ENCOUNTER — Ambulatory Visit (INDEPENDENT_AMBULATORY_CARE_PROVIDER_SITE_OTHER): Payer: Medicare Other

## 2018-01-11 VITALS — BP 122/70 | HR 59 | Temp 98.0°F | Ht 67.5 in | Wt 246.5 lb

## 2018-01-11 DIAGNOSIS — Z0000 Encounter for general adult medical examination without abnormal findings: Secondary | ICD-10-CM

## 2018-01-11 DIAGNOSIS — E559 Vitamin D deficiency, unspecified: Secondary | ICD-10-CM

## 2018-01-11 DIAGNOSIS — E7800 Pure hypercholesterolemia, unspecified: Secondary | ICD-10-CM

## 2018-01-11 DIAGNOSIS — R739 Hyperglycemia, unspecified: Secondary | ICD-10-CM | POA: Diagnosis not present

## 2018-01-11 LAB — COMPREHENSIVE METABOLIC PANEL
ALT: 17 U/L (ref 0–35)
AST: 18 U/L (ref 0–37)
Albumin: 4.3 g/dL (ref 3.5–5.2)
Alkaline Phosphatase: 71 U/L (ref 39–117)
BUN: 16 mg/dL (ref 6–23)
CO2: 30 mEq/L (ref 19–32)
Calcium: 9 mg/dL (ref 8.4–10.5)
Chloride: 101 mEq/L (ref 96–112)
Creatinine, Ser: 0.7 mg/dL (ref 0.40–1.20)
GFR: 87.24 mL/min (ref >60.00)
Glucose, Bld: 104 mg/dL — ABNORMAL HIGH (ref 70–99)
Potassium: 4.2 mEq/L (ref 3.5–5.1)
Sodium: 138 mEq/L (ref 135–145)
Total Bilirubin: 0.6 mg/dL (ref 0.2–1.2)
Total Protein: 7.4 g/dL (ref 6.0–8.3)

## 2018-01-11 LAB — CBC WITH DIFFERENTIAL/PLATELET
Basophils Absolute: 0 10*3/uL (ref 0.0–0.1)
Basophils Relative: 0.6 % (ref 0.0–3.0)
Eosinophils Absolute: 0.2 10*3/uL (ref 0.0–0.7)
Eosinophils Relative: 3.1 % (ref 0.0–5.0)
HCT: 41.3 % (ref 36.0–46.0)
Hemoglobin: 13.8 g/dL (ref 12.0–15.0)
Lymphocytes Relative: 17.6 % (ref 12.0–46.0)
Lymphs Abs: 1.1 10*3/uL (ref 0.7–4.0)
MCHC: 33.5 g/dL (ref 30.0–36.0)
MCV: 87.3 fl (ref 78.0–100.0)
Monocytes Absolute: 0.7 10*3/uL (ref 0.1–1.0)
Monocytes Relative: 11.5 % (ref 3.0–12.0)
Neutro Abs: 4.1 10*3/uL (ref 1.4–7.7)
Neutrophils Relative %: 67.2 % (ref 43.0–77.0)
Platelets: 191 10*3/uL (ref 150.0–400.0)
RBC: 4.73 Mil/uL (ref 3.87–5.11)
RDW: 15.5 % (ref 11.5–15.5)
WBC: 6.1 10*3/uL (ref 4.0–10.5)

## 2018-01-11 LAB — LIPID PANEL
Cholesterol: 163 mg/dL (ref 0–200)
HDL: 49.5 mg/dL (ref >39.00)
LDL Cholesterol: 99 mg/dL (ref 0–99)
NonHDL: 113.38
Total CHOL/HDL Ratio: 3
Triglycerides: 73 mg/dL (ref 0.0–149.0)
VLDL: 14.6 mg/dL (ref 0.0–40.0)

## 2018-01-11 LAB — HEMOGLOBIN A1C: Hgb A1c MFr Bld: 6.1 % (ref 4.6–6.5)

## 2018-01-11 LAB — VITAMIN D 25 HYDROXY (VIT D DEFICIENCY, FRACTURES): VITD: 47.33 ng/mL (ref 30.00–100.00)

## 2018-01-11 LAB — TSH: TSH: 1.57 u[IU]/mL (ref 0.35–4.50)

## 2018-01-11 NOTE — Progress Notes (Addendum)
PCP notes:   Health maintenance:  No gaps identified.   Abnormal screenings:   Hearing - failed  Hearing Screening   125Hz  250Hz  500Hz  1000Hz  2000Hz  3000Hz  4000Hz  6000Hz  8000Hz   Right ear:   40 40 40  0    Left ear:   40 40 40  0     Patient concerns:   None  Nurse concerns:  None  Next PCP appt:   01/15/2018 @ 0830  I reviewed health advisor's note, was available for consultation, and agree with documentation and plan. Loura Pardon MD   I reviewed health advisor's note, was available for consultation, and agree with documentation and plan. Loura Pardon MD

## 2018-01-11 NOTE — Patient Instructions (Signed)
Lauren [name] , Thank you for taking time to come for your Medicare Wellness Visit. I appreciate your ongoing commitment to your health goals. Please review the following plan we discussed and let me know if I can assist you in the future.   These are the goals we discussed: Goals    . Increase physical activity     Starting 01/11/2018, I will continue to walk at least 20 min daily.        This is a list of the screening recommended for you and due dates:  Health Maintenance  Topic Date Due  . Mammogram  03/11/2020*  . Pap Smear  01/05/2024*  . Cologuard (Stool DNA test)  09/08/2019  . Tetanus Vaccine  07/08/2025  . Flu Shot  Completed  . DEXA scan (bone density measurement)  Completed  .  Hepatitis C: One time screening is recommended by Center for Disease Control  (CDC) for  adults born from [birth year] through 1965.   Completed  . Pneumonia vaccines  Completed  *Topic was postponed. The date shown is not the original due date.   Preventive Care for Adults  A healthy lifestyle and preventive care can promote health and wellness. Preventive health guidelines for adults include the following key practices.  . A routine yearly physical is a good way to check with your health care provider about your health and preventive screening. It is a chance to share any concerns and updates on your health and to receive a thorough exam.  . Visit your dentist for a routine exam and preventive care every 6 months. Brush your teeth twice a day and floss once a day. Good oral hygiene prevents tooth decay and gum disease.  . The frequency of eye exams is based on your age, health, family medical history, use  of contact lenses, and other factors. Follow your health care provider's recommendations for frequency of eye exams.  . Eat a healthy diet. Foods like vegetables, fruits, whole grains, low-fat dairy products, and lean protein foods contain the nutrients you need without too many calories. Decrease your  intake of foods high in solid fats, added sugars, and salt. Eat the right amount of calories for you. Get information about a proper diet from your health care provider, if necessary.  . Regular physical exercise is one of the most important things you can do for your health. Most adults should get at least 150 minutes of moderate-intensity exercise (any activity that increases your heart rate and causes you to sweat) each week. In addition, most adults need muscle-strengthening exercises on 2 or more days a week.  Silver Sneakers may be a benefit available to you. To determine eligibility, you may visit the website: www.silversneakers.com or contact program at 8630168697 Mon-Fri between 8AM-8PM.   . Maintain a healthy weight. The body mass index (BMI) is a screening tool to identify possible weight problems. It provides an estimate of body fat based on height and weight. Your health care provider can find your BMI and can help you achieve or maintain a healthy weight.   For adults 20 years and older: ? A BMI below 18.5 is considered underweight. ? A BMI of 18.5 to 24.9 is normal. ? A BMI of 25 to 29.9 is considered overweight. ? A BMI of 30 and above is considered obese.   . Maintain normal blood lipids and cholesterol levels by exercising and minimizing your intake of saturated fat. Eat a balanced diet with plenty of fruit and  vegetables. Blood tests for lipids and cholesterol should begin at age 32 and be repeated every 5 years. If your lipid or cholesterol levels are high, you are over 50, or you are at high risk for heart disease, you may need your cholesterol levels checked more frequently. Ongoing high lipid and cholesterol levels should be treated with medicines if diet and exercise are not working.  . If you smoke, find out from your health care provider how to quit. If you do not use tobacco, please do not start.  . If you choose to drink alcohol, please do not consume more than 2  drinks per day. One drink is considered to be 12 ounces (355 mL) of beer, 5 ounces (148 mL) of wine, or 1.5 ounces (44 mL) of liquor.  . If you are 47-89 years old, ask your health care provider if you should take aspirin to prevent strokes.  . Use sunscreen. Apply sunscreen liberally and repeatedly throughout the day. You should seek shade when your shadow is shorter than you. Protect yourself by wearing long sleeves, pants, a wide-brimmed hat, and sunglasses year round, whenever you are outdoors.  . Once a month, do a whole body skin exam, using a mirror to look at the skin on your back. Tell your health care provider of new moles, moles that have irregular borders, moles that are larger than a pencil eraser, or moles that have changed in shape or color.

## 2018-01-11 NOTE — Progress Notes (Addendum)
Subjective:   Lauren [name] is a 72 y.o. female who presents for Medicare Annual (Subsequent) preventive examination.  Review of Systems:  N/A Cardiac Risk Factors include: advanced age (>49mn, >>65women);dyslipidemia;obesity (BMI >30kg/m2)     Objective:     Vitals: BP 122/70 (BP Location: Right Arm, Patient Position: Sitting, Cuff Size: Large)   Pulse (!) 59   Temp 98 F (36.7 C) (Oral)   Ht 5' 7.5" (1.715 m) Comment: no shoes  Wt 246 lb 8 oz (111.8 kg)   SpO2 96%   BMI 38.04 kg/m   Body mass index is 38.04 kg/m.  Advanced Directives 01/11/2018 01/04/2017 12/22/2015  Does Patient Have a Medical Advance Directive? Yes Yes Yes  Type of AParamedicof AParcelas La [place]Living will [place] will [place] will  Does patient want to make changes to medical advance directive? - - No - Patient declined  Copy of [place] Chart? No - copy requested No - copy requested No - copy requested    Tobacco Social History   Tobacco Use  Smoking Status Never Smoker  Smokeless Tobacco Never Used     Counseling given: No   Clinical Intake:  Pre-visit preparation completed: Yes  Pain : No/denies pain Pain Score: 0-No pain     Nutritional Status: BMI > 30  Obese Nutritional Risks: None Diabetes: No  How often do you need to have someone help you when you read instructions, pamphlets, or other written materials from your doctor or pharmacy?: 1 - Never What is the last grade level you completed in school?: Master degree + additional courses  Interpreter Needed?: No  Comments: pt is a widow and lives alone Information entered by :: LPinson, LPN  Past Medical History:  Diagnosis Date  . Breast cancer ([place]    DCIS; BRCA 1 pos HER2 pos, ER2 pos  . DDD (degenerative disc disease)   . GERD (gastroesophageal reflux disease)   . HLD (hyperlipidemia)    statin intolerant  .  Hyperglycemia 9/09   A1C elvated to 6.2  . Osteoarthritis   . Osteopenia 2007   ? from xray with nl dexa  . Reactive depression (situational)    very well controlled, and now takes prozac for sleep  . Sjogren's syndrome ([place]   . Urine incontinence   . Vitamin D deficiency    Past Surgical History:  Procedure Laterality Date  . BILATERAL OOPHORECTOMY  205  . COLONOSCOPY  5/07   normal; recheck 3 years  . LUMBAR DISC SURGERY  1991   Ruptured disk L5  . MASTECTOMY  2001   bilateral  . TONSILLECTOMY  1974  . TOTAL HIP ARTHROPLASTY  2011  . TOTAL KNEE ARTHROPLASTY  2006   bilateral   Family History  Problem Relation Age of Onset  . Arthritis Unknown        family hx  . Breast cancer Unknown        1st degree relative <50  . Diabetes Unknown        1st degree relative  . Hyperlipidemia Unknown        family hx  . Hypertension Unknown        family hx  . Ovarian cancer Unknown        family hx  . Prostate cancer Unknown        1st degree relative <50  . Stroke Unknown        family  hx  . Dementia Mother   . Psoriasis Brother   . Other Daughter        BRCA gene  . Brain cancer Brother    Social History   Socioeconomic History  . Marital status: Widowed    Spouse name: Not on file  . Number of children: 3  . Years of education: Not on file  . Highest education level: Not on file  Occupational History  . Occupation: Retired  Scientific laboratory technician  . Financial resource strain: Not on file  . Food insecurity:    Worry: Not on file    Inability: Not on file  . Transportation needs:    Medical: Not on file    Non-medical: Not on file  Tobacco Use  . Smoking status: Never Smoker  . Smokeless tobacco: Never Used  Substance and Sexual Activity  . Alcohol use: Yes    Alcohol/week: 0.0 standard drinks    Comment: occassional wine  . Drug use: No  . Sexual activity: Not Currently  Lifestyle  . Physical activity:    Days per week: Not on file    Minutes per session:  Not on file  . Stress: Not on file  Relationships  . Social connections:    Talks on phone: Not on file    Gets together: Not on file    Attends religious service: Not on file    Active member of club or organization: Not on file    Attends meetings of clubs or organizations: Not on file    Relationship status: Not on file  Other Topics Concern  . Not on file  Social History Narrative   Retired Cabin crew from [place]      Divorced      [id number]      Regular exercise-yes    Outpatient Encounter Medications as of 01/11/2018  Medication Sig  . celecoxib (CELEBREX) 200 MG capsule Take 1 capsule (200 mg total) by mouth daily.  . cholecalciferol (VITAMIN D) 1000 units tablet Take 1,000 Units by mouth daily.  Marland Kitchen EPINEPHrine 0.3 mg/0.3 mL IJ SOAJ injection Inject 0.3 mLs (0.3 mg total) into the muscle once.  . ezetimibe (ZETIA) 10 MG tablet TAKE 1 TABLET BY MOUTH DAILY  . Fexofenadine HCl (ALLEGRA PO) Take 1 tablet by mouth daily.  . Fish Oil-Cholecalciferol (OMEGA-3 + VITAMIN D3 PO) Take 2 capsules by mouth daily.   . fluconazole (DIFLUCAN) 150 MG tablet Take one pill by mouth every 3 days for 3 doses  . FLUoxetine (PROZAC) 20 MG capsule Take 1 capsule (20 mg total) by mouth daily.  Marland Kitchen nystatin cream (MYCOSTATIN) Apply 1 application topically 2 (two) times daily. To affected area  . omeprazole (PRILOSEC) 20 MG capsule Take 20 mg by mouth daily.    Marland Kitchen oxybutynin (OXYTROL) 3.9 MG/24HR Place 1 patch onto the skin 2 (two) times a week.  . pilocarpine (SALAGEN) 5 MG tablet TAKE 1 TABLET BY MOUTH THREE TIMES DAILY  . valACYclovir (VALTREX) 500 MG tablet Take 1 tablet (500 mg total) by mouth 2 (two) times daily. As needed for outbreak/rash  . [DISCONTINUED] cetirizine (ZYRTEC) 5 MG tablet Take 5 mg by mouth daily.   No facility-administered encounter medications on file as of 01/11/2018.     Activities of Daily Living In your present state of health, do you have any  difficulty performing the following activities: 01/11/2018  Hearing? N  Vision? N  Difficulty concentrating or making decisions?  N  Walking or climbing stairs? N  Dressing or bathing? N  Doing errands, shopping? N  Preparing Food and eating ? N  Using the Toilet? N  In the past six months, have you accidently leaked urine? N  Do you have problems with loss of bowel control? N  Managing your Medications? N  Managing your Finances? N  Housekeeping or managing your Housekeeping? N  Some recent data might be hidden    Patient Care Team: Tower, Wynelle Fanny, MD as PCP - General Sharol Roussel, OD as Physician Assistant (Optometry) Lorelee Cover., MD as Referring Physician (Ophthalmology) Laverle Hobby, MD as Consulting Physician (Pulmonary Disease) Marc Morgans, DMD, PA as Referring Physician (Dentistry)    Assessment:   This is a routine wellness examination for Lauren [name].   Hearing Screening   '125Hz'  '250Hz'  '500Hz'  '1000Hz'  '2000Hz'  '3000Hz'  '4000Hz'  '6000Hz'  '8000Hz'   Right ear:   40 40 40  0    Left ear:   40 40 40  0    Vision Screening Comments: Vision exam in August 2019 with Dr. Gloriann Loan   Exercise Activities and Dietary recommendations Current Exercise Habits: Home exercise routine, Type of exercise: walking, Time (Minutes): 20, Frequency (Times/Week): 7, Weekly Exercise (Minutes/Week): 140, Intensity: Mild, Exercise limited by: None identified  Goals    . Increase physical activity     Starting 01/11/2018, I will continue to walk at least 20 min daily.        Fall Risk Fall Risk  01/11/2018 01/04/2017 01/14/2016 12/22/2015 12/24/2014  Falls in the past year? 0 No No No No  Comment - - Emmi Telephone Survey: data to providers prior to load - -  Number falls in past yr: - - - - -  Injury with Fall? - - - - -   Depression Screen PHQ 2/9 Scores 01/11/2018 01/04/2017 12/22/2015 12/24/2014  PHQ - 2 Score 0 0 0 0  PHQ- 9 Score 0 0 - -     Cognitive Function MMSE - Mini Mental State Exam  01/11/2018 01/04/2017 12/22/2015  Orientation to time '5 5 5  ' Orientation to Place '5 5 5  ' Registration '3 3 3  ' Attention/ Calculation 0 0 0  Recall '3 3 3  ' Language- name 2 objects 0 0 0  Language- repeat '1 1 1  ' Language- follow 3 step command '3 3 3  ' Language- read & follow direction 0 0 0  Write a sentence 0 0 0  Copy design 0 0 0  Total score '20 20 20     ' PLEASE NOTE: A Mini-Cog screen was completed. Maximum score is 20. A value of 0 denotes this part of Folstein MMSE was not completed or the patient failed this part of the Mini-Cog screening.   Mini-Cog Screening Orientation to Time - Max 5 pts Orientation to Place - Max 5 pts Registration - Max 3 pts Recall - Max 3 pts Language Repeat - Max 1 pts Language Follow 3 Step Command - Max 3 pts     Immunization History  Administered Date(s) Administered  . HPV Bivalent 11/10/2016  . Influenza Split 11/26/2010  . Influenza Whole 12/06/2009  . Influenza, Seasonal, Injecte, Preservative Fre 10/23/2015  . Influenza,inj,Quad PF,6+ Mos 11/26/2013, 11/10/2014, 11/10/2016, 11/22/2017  . Pneumococcal Conjugate-13 12/24/2014, 05/31/2016  . Pneumococcal Polysaccharide-23 01/19/2010, 01/08/2018  . Td 10/13/2006  . Zoster 02/07/2005  . Zoster Recombinat (Shingrix) 05/31/2016, 08/23/2016    Screening Tests Health Maintenance  Topic Date Due  . MAMMOGRAM  03/11/2020 (Originally  04/16/1995)  . PAP SMEAR  01/05/2024 (Originally 02/14/2012)  . Fecal DNA (Cologuard)  09/08/2019  . TETANUS/TDAP  07/08/2025  . INFLUENZA VACCINE  Completed  . DEXA SCAN  Completed  . Hepatitis C Screening  Completed  . PNA vac Low Risk Adult  Completed      Plan:     I have personally reviewed, addressed, and noted the following in the patient's chart:  A. Medical and social history B. Use of alcohol, tobacco or illicit drugs  C. Current medications and supplements D. Functional ability and status E.  Nutritional status F.  Physical activity G. Advance  directives H. List of other physicians I.  Hospitalizations, surgeries, and ER visits in previous 12 months J.  [place] to include hearing, vision, cognitive, depression L. Referrals and appointments - none  In addition, I have reviewed and discussed with patient certain preventive protocols, quality metrics, and best practice recommendations. A written personalized care plan for preventive services as well as general preventive health recommendations were provided to patient.  See attached scanned questionnaire for additional information.   Signed,   Lindell Noe, MHA, BS, LPN Health Coach  I reviewed health advisor's note, was available for consultation, and agree with documentation and plan. Loura Pardon MD

## 2018-01-15 ENCOUNTER — Ambulatory Visit (INDEPENDENT_AMBULATORY_CARE_PROVIDER_SITE_OTHER): Payer: Medicare Other | Admitting: Family Medicine

## 2018-01-15 ENCOUNTER — Encounter: Payer: Self-pay | Admitting: Family Medicine

## 2018-01-15 VITALS — BP 122/80 | HR 60 | Temp 97.9°F | Ht 67.5 in | Wt 242.5 lb

## 2018-01-15 DIAGNOSIS — M150 Primary generalized (osteo)arthritis: Secondary | ICD-10-CM

## 2018-01-15 DIAGNOSIS — M1990 Unspecified osteoarthritis, unspecified site: Secondary | ICD-10-CM | POA: Insufficient documentation

## 2018-01-15 DIAGNOSIS — Z1211 Encounter for screening for malignant neoplasm of colon: Secondary | ICD-10-CM | POA: Diagnosis not present

## 2018-01-15 DIAGNOSIS — J069 Acute upper respiratory infection, unspecified: Secondary | ICD-10-CM | POA: Diagnosis not present

## 2018-01-15 DIAGNOSIS — E7800 Pure hypercholesterolemia, unspecified: Secondary | ICD-10-CM

## 2018-01-15 DIAGNOSIS — G4733 Obstructive sleep apnea (adult) (pediatric): Secondary | ICD-10-CM | POA: Diagnosis not present

## 2018-01-15 DIAGNOSIS — R7303 Prediabetes: Secondary | ICD-10-CM | POA: Diagnosis not present

## 2018-01-15 DIAGNOSIS — M3500 Sicca syndrome, unspecified: Secondary | ICD-10-CM | POA: Diagnosis not present

## 2018-01-15 DIAGNOSIS — M159 Polyosteoarthritis, unspecified: Secondary | ICD-10-CM

## 2018-01-15 DIAGNOSIS — E559 Vitamin D deficiency, unspecified: Secondary | ICD-10-CM | POA: Diagnosis not present

## 2018-01-15 DIAGNOSIS — Z853 Personal history of malignant neoplasm of breast: Secondary | ICD-10-CM

## 2018-01-15 DIAGNOSIS — B9789 Other viral agents as the cause of diseases classified elsewhere: Secondary | ICD-10-CM | POA: Diagnosis not present

## 2018-01-15 DIAGNOSIS — M8949 Other hypertrophic osteoarthropathy, multiple sites: Secondary | ICD-10-CM

## 2018-01-15 DIAGNOSIS — E669 Obesity, unspecified: Secondary | ICD-10-CM | POA: Diagnosis not present

## 2018-01-15 MED ORDER — OXYBUTYNIN 3.9 MG/24HR TD PTTW: 1.0000 | MEDICATED_PATCH | TRANSDERMAL | 3 refills | Status: DC

## 2018-01-15 MED ORDER — FLUOXETINE HCL 20 MG PO CAPS: 20.0000 mg | ORAL_CAPSULE | Freq: Every day | ORAL | 3 refills | Status: DC

## 2018-01-15 MED ORDER — CELECOXIB 200 MG PO CAPS: 200.0000 mg | ORAL_CAPSULE | Freq: Every day | ORAL | 3 refills | Status: DC

## 2018-01-15 NOTE — Progress Notes (Signed)
Subjective:    Patient ID: Lauren [name], female    DOB: [birth year], 72 y.o.   MRN: [id number]  HPI Here for annual f/u of chronic medical problems   Wt Readings from Last 3 Encounters:  01/15/18 242 lb 8 oz (110 kg)  01/11/18 246 lb 8 oz (111.8 kg)  11/22/17 243 lb (110.2 kg)  wt is down 4 lb  Down from 264 a year ago!  Cutting back on sugar and processed carbs  Walking for exercise (15-20 per day)  37.42 kg/m   Had amw on 12/5 Missed highest tones on hearing screen bilat  Pt notices her ears bother her in ears /has tinnitus (she will get hearing aides when needed)  No gaps   Has a uri  Tickle in throat  Then cough -not a lot of production  No fever  Taking mucinex DM   Just returned from 4 weeks in [place] to [place] soon   Mammogram -not needed due to double mastectomy for breast cancer  Also had oophrectomy due to BRCA 1  Had anti est tx and chemo -no re occurances Yeast rash is better   cologuard 8/18 neg  dexa 1/19 -bone density is in the normal range  H/o vit D def - level of 47.3 this check   shingrix vaccines 2018   BP BP Readings from Last 3 Encounters:  01/15/18 140/80  01/11/18 122/70  11/22/17 122/76  up a bit due to illness today  Was good on Thursday   Pulse Readings from Last 3 Encounters:  01/15/18 60  01/11/18 (!) 59  11/22/17 62    Prediabetes Lab Results  Component Value Date   HGBA1C 6.1 01/11/2018  getting weight off with less sugar/carbs (getting used to it)  This is down from 6.3 last time  Cholesterol Lab Results  Component Value Date   CHOL 163 01/11/2018   CHOL 175 01/04/2017   CHOL 174 12/22/2015   Lab Results  Component Value Date   HDL 49.50 01/11/2018   HDL 49.40 01/04/2017   HDL 50.40 12/22/2015   Lab Results  Component Value Date   LDLCALC 99 01/11/2018   LDLCALC 110 (H) 01/04/2017   LDLCALC 102 (H) 12/22/2015   Lab Results  Component Value Date   TRIG 73.0 01/11/2018   TRIG 78.0 01/04/2017   TRIG 106.0 12/22/2015   Lab Results  Component Value Date   CHOLHDL 3 01/11/2018   CHOLHDL 4 01/04/2017   CHOLHDL 3 12/22/2015   Lab Results  Component Value Date   LDLDIRECT 148.9 08/20/2012   LDLDIRECT 184.7 02/11/2009     Arthritis symptoms did improve with less sugar Still takes celebrex  H/o sjogren's dz  Mostly dry eyes and dry mouth  Takes salagen  Wants rheum ref ([place] of state)   Lab Results  Component Value Date   CREATININE 0.70 01/11/2018   BUN 16 01/11/2018   NA 138 01/11/2018   K 4.2 01/11/2018   CL 101 01/11/2018   CO2 30 01/11/2018   Lab Results  Component Value Date   ALT 17 01/11/2018   AST 18 01/11/2018   ALKPHOS 71 01/11/2018   BILITOT 0.6 01/11/2018    Lab Results  Component Value Date   WBC 6.1 01/11/2018   HGB 13.8 01/11/2018   HCT 41.3 01/11/2018   MCV 87.3 01/11/2018   PLT 191.0 01/11/2018    Lab Results  Component Value Date   TSH 1.57 01/11/2018  Patient Active Problem List   Diagnosis Date Noted  . Sjogren's disease ([place]) 01/15/2018  . Yeast infection of the skin 11/22/2017  . Yeast vaginitis 11/22/2017  . H/O herpes zoster 11/22/2017  . Estrogen deficiency 01/09/2017  . Routine general medical examination at a health care facility 01/01/2017  . OSA (obstructive sleep apnea) 09/18/2016  . Somnolence, daytime 09/18/2016  . Colon cancer screening 12/24/2014  . Bee sting allergy 06/10/2014  . Encounter for Medicare annual wellness exam 12/03/2013  . History of shingles 11/27/2013  . Hx of breast cancer 08/22/2011  . Gynecological examination 02/14/2011  . Bilateral dry eyes 01/20/2011  . OSTEOARTHRITIS 04/23/2008  . Vitamin D deficiency 04/22/2008  . Hyperlipidemia 04/22/2008  . GERD 04/22/2008  . Prediabetes 04/22/2008   Past Medical History:  Diagnosis Date  . Breast cancer ([place])    DCIS; BRCA 1 pos HER2 pos, ER2 pos  . DDD (degenerative disc disease)   . GERD (gastroesophageal reflux disease)   .  HLD (hyperlipidemia)    statin intolerant  . Hyperglycemia 9/09   A1C elvated to 6.2  . Osteoarthritis   . Osteopenia 2007   ? from xray with nl dexa  . Reactive depression (situational)    very well controlled, and now takes prozac for sleep  . Sjogren's syndrome ([place])   . Urine incontinence   . Vitamin D deficiency    Past Surgical History:  Procedure Laterality Date  . BILATERAL OOPHORECTOMY  205  . COLONOSCOPY  5/07   normal; recheck 3 years  . LUMBAR DISC SURGERY  1991   Ruptured disk L5  . MASTECTOMY  2001   bilateral  . TONSILLECTOMY  1974  . TOTAL HIP ARTHROPLASTY  2011  . TOTAL KNEE ARTHROPLASTY  2006   bilateral   Social History   Tobacco Use  . Smoking status: Never Smoker  . Smokeless tobacco: Never Used  Substance Use Topics  . Alcohol use: Yes    Alcohol/week: 0.0 standard drinks    Comment: occassional wine  . Drug use: No   Family History  Problem Relation Age of Onset  . Arthritis Unknown        family hx  . Breast cancer Unknown        1st degree relative <50  . Diabetes Unknown        1st degree relative  . Hyperlipidemia Unknown        family hx  . Hypertension Unknown        family hx  . Ovarian cancer Unknown        family hx  . Prostate cancer Unknown        1st degree relative <50  . Stroke Unknown        family hx  . Dementia Mother   . Psoriasis Brother   . Other Daughter        BRCA gene  . Brain cancer Brother    Allergies  Allergen Reactions  . Bee Venom   . Statins     REACTION: joint pain   Current Outpatient Medications on File Prior to Visit  Medication Sig Dispense Refill  . cholecalciferol (VITAMIN D) 1000 units tablet Take 1,000 Units by mouth daily.    Marland Kitchen EPINEPHrine 0.3 mg/0.3 mL IJ SOAJ injection Inject 0.3 mLs (0.3 mg total) into the muscle once. 1 Device 5  . ezetimibe (ZETIA) 10 MG tablet TAKE 1 TABLET BY MOUTH DAILY 90 tablet 1  . Fexofenadine HCl (ALLEGRA PO)  Take 1 tablet by mouth daily.    . Fish  Oil-Cholecalciferol (OMEGA-3 + VITAMIN D3 PO) Take 2 capsules by mouth daily.     . fluconazole (DIFLUCAN) 150 MG tablet Take one pill by mouth every 3 days for 3 doses 3 tablet 0  . nystatin cream (MYCOSTATIN) Apply 1 application topically 2 (two) times daily. To affected area 30 g 0  . omeprazole (PRILOSEC) 20 MG capsule Take 20 mg by mouth daily.      . pilocarpine (SALAGEN) 5 MG tablet TAKE 1 TABLET BY MOUTH THREE TIMES DAILY 270 tablet 2  . valACYclovir (VALTREX) 500 MG tablet Take 1 tablet (500 mg total) by mouth 2 (two) times daily. As needed for outbreak/rash 14 tablet 0   No current facility-administered medications on file prior to visit.     Review of Systems  Constitutional: Positive for appetite change and fatigue. Negative for fever.  HENT: Positive for congestion, postnasal drip, rhinorrhea, sinus pressure, sneezing and sore throat. Negative for ear pain.   Eyes: Negative for pain and discharge.  Respiratory: Positive for cough. Negative for shortness of breath, wheezing and stridor.   Cardiovascular: Negative for chest pain.  Gastrointestinal: Negative for diarrhea, nausea and vomiting.  Genitourinary: Negative for frequency, hematuria and urgency.  Musculoskeletal: Positive for arthralgias. Negative for back pain and myalgias.  Skin: Negative for rash.  Neurological: Positive for headaches. Negative for dizziness, weakness and light-headedness.  Psychiatric/Behavioral: Negative for confusion and dysphoric mood.       Objective:   Physical Exam  Constitutional: She appears well-developed and well-nourished. No distress.  obese and well appearing   HENT:  Head: Normocephalic and atraumatic.  Right Ear: External ear normal.  Left Ear: External ear normal.  Nose: Nose normal.  Mouth/Throat: Oropharynx is clear and moist.  Eyes: Pupils are equal, round, and reactive to light. Conjunctivae and EOM are normal. Right eye exhibits no discharge. Left eye exhibits no  discharge. No scleral icterus.  Neck: Normal range of motion. Neck supple. No JVD present. Carotid bruit is not present. No thyromegaly present.  Cardiovascular: Normal rate, regular rhythm, normal heart sounds and intact distal pulses. Exam reveals no gallop.  Pulmonary/Chest: Effort normal and breath sounds normal. No stridor. No respiratory distress. She has no wheezes. She has no rales.  Abdominal: Soft. Bowel sounds are normal. She exhibits no distension and no mass. There is no tenderness.  Genitourinary:  Genitourinary Comments: Chest wall mastectomy sites are nontender and w/o change   Musculoskeletal: She exhibits no edema or tenderness.  No acute joint swelling   Lymphadenopathy:    She has no cervical adenopathy.  Neurological: She is alert. She has normal reflexes. She displays normal reflexes. No cranial nerve deficit. She exhibits normal muscle tone. Coordination normal.  Skin: Skin is warm and dry. No rash noted. No erythema. No pallor.  Solar lentigines diffusely   Psychiatric: She has a normal mood and affect.  Pleasant           Assessment & Plan:   Problem List Items Addressed This Visit      Respiratory   Viral URI with cough    Mild  Finishing first week Disc symptomatic care - see instructions on AVS (use afrin when flying next week to prevent ear pain) Update if not starting to improve in a week or if worsening        OSA (obstructive sleep apnea)    Pt feels much better with cpap  Digestive   Sjogren's disease ([place])    With dry mouth and eyes Uses salagen  Now having more joint c/o with stiffness and pain  Wishes to est with rheumatologist here  Ref done       Relevant Orders   Ambulatory referral to Rheumatology     Musculoskeletal and Integument   Osteoarthritis    Continues celebrex prn  Nl renal fxn No GI side eff bp is ok       Relevant Medications   celecoxib (CELEBREX) 200 MG capsule     Other   Vitamin D deficiency      D level of 47 Vitamin D level is therapeutic with current supplementation Disc importance of this to bone and overall health       Prediabetes - Primary    Lab Results  Component Value Date   HGBA1C 6.1 01/11/2018   disc imp of low glycemic diet and wt loss to prevent DM2  Enc pt to keep loosing weight      Obesity (BMI 30-39.9)    Discussed how this problem influences overall health and the risks it imposes  Reviewed plan for weight loss with lower calorie diet (via better food choices and also portion control or program like weight watchers) and exercise building up to or more than 30 minutes 5 days per week including some aerobic activity   Commended on wt loss so far with lifestyle chagne      Hyperlipidemia    LDL down to 99 Commended Disc goals for lipids and reasons to control them Rev last labs with pt Rev low sat fat diet in detail       Hx of breast cancer    S/p bilat mastectomy  Nl exam of chest wall today and no c/o        Colon cancer screening    cologuard 8/18 negative

## 2018-01-15 NOTE — Assessment & Plan Note (Signed)
Continues celebrex prn  Nl renal fxn No GI side eff bp is ok

## 2018-01-15 NOTE — Patient Instructions (Signed)
For ear pain when flying- you can use afrin nasal spray 10 minutes before take off and landing  (or less if the 12 hour formula)  That helps you adjust to pressure   Drink lots of fluids Keep eating well and loosing weight  Keep walking   We will refer you to rheumatology   Continue mucinex DM for cough/congestion  Get rest and drink fluids

## 2018-01-15 NOTE — Assessment & Plan Note (Signed)
D level of 47 Vitamin D level is therapeutic with current supplementation Disc importance of this to bone and overall health  

## 2018-01-15 NOTE — Assessment & Plan Note (Signed)
Lab Results  Component Value Date   HGBA1C 6.1 01/11/2018   disc imp of low glycemic diet and wt loss to prevent DM2  Enc pt to keep loosing weight

## 2018-01-15 NOTE — Assessment & Plan Note (Signed)
S/p bilat mastectomy  Nl exam of chest wall today and no c/o

## 2018-01-15 NOTE — Assessment & Plan Note (Signed)
Discussed how this problem influences overall health and the risks it imposes  Reviewed plan for weight loss with lower calorie diet (via better food choices and also portion control or program like weight watchers) and exercise building up to or more than 30 minutes 5 days per week including some aerobic activity   Commended on wt loss so far with lifestyle chagne

## 2018-01-15 NOTE — Assessment & Plan Note (Signed)
LDL down to 99 Commended Disc goals for lipids and reasons to control them Rev last labs with pt Rev low sat fat diet in detail

## 2018-01-15 NOTE — Assessment & Plan Note (Signed)
Mild  Finishing first week Disc symptomatic care - see instructions on AVS (use afrin when flying next week to prevent ear pain) Update if not starting to improve in a week or if worsening

## 2018-01-15 NOTE — Assessment & Plan Note (Signed)
With dry mouth and eyes Uses salagen  Now having more joint c/o with stiffness and pain  Wishes to est with rheumatologist here  Ref done

## 2018-01-15 NOTE — Assessment & Plan Note (Signed)
cologuard 8/18 negative

## 2018-01-15 NOTE — Assessment & Plan Note (Signed)
Pt feels much better with cpap

## 2018-01-23 NOTE — Progress Notes (Addendum)
Addendum created to make corrections to chart.  I reviewed health advisor's note, was available for consultation, and agree with documentation and plan. Loura Pardon MD

## 2018-01-23 NOTE — Addendum Note (Signed)
Addended by: Eustace Pen on: 01/23/2018 02:40 PM   Modules accepted: Orders

## 2018-02-13 ENCOUNTER — Encounter: Payer: Self-pay | Admitting: Internal Medicine

## 2018-02-13 DIAGNOSIS — M159 Polyosteoarthritis, unspecified: Secondary | ICD-10-CM | POA: Diagnosis not present

## 2018-02-13 DIAGNOSIS — M3509 Sicca syndrome with other organ involvement: Secondary | ICD-10-CM | POA: Diagnosis not present

## 2018-02-13 DIAGNOSIS — R202 Paresthesia of skin: Secondary | ICD-10-CM | POA: Diagnosis not present

## 2018-02-13 DIAGNOSIS — R200 Anesthesia of skin: Secondary | ICD-10-CM | POA: Diagnosis not present

## 2018-02-14 ENCOUNTER — Telehealth: Payer: Self-pay | Admitting: Internal Medicine

## 2018-02-14 NOTE — Progress Notes (Signed)
[facility] Consultation      Assessment and Plan:  Sleep apnea. --Continue using your CPAP every night.   GERD, Sjogren's syndrome, hyperglycemia. -Sleep apnea can worsen, GERD, and hyperglycemia, therefore, it is important to treat the sleep apnea if present. The patient Sjogren's syndrome can cause dry mouth, which may be exacerbated slightly by use of CPAP, which we discussed, we will continue to monitor this.  Date: 02/14/2018  MRN# [id number] Lauren [name] [birth year]    Lauren [name] is a 73 y.o. old female seen in consultation for chief complaint of:    Chief Complaint  Patient presents with  . Follow-up    yearly follow up-CPAP    HPI:   The patient returns for follow up after a recent sleep study which showed AHI of 76 and PLMS with arousal index of 5.9. She was recommended to start auto-PAP with pressure range of 13-16.   She has been using her CPAP every night, she travels often but usually uses a travel cpap during those times. She rinses supplies once per week. Still feels more awake since starting the cpap, she no longer takes naps during the day, and has lost 20 pounds.  Currently she denies issues with dry mouth.    Severe OSA with AHI of 76 (severe is greater than 30). .Periodic Limb Movement Disorder [G47.61] with arousal index of 5.9  .Sleep study with CPAP titration without dental device.  F&P Simplus Full Face, Medium  Auto-CPAP with pressure range of 13-16  CmH2O  **CPAP download 12/9-02/13/2018>> raw data personally reviewed, usage greater than 4 hours is 30/30 days.  Average usage is 6 hours 20 minutes.  Leaks are within normal limits, residual AHI 0.36.  Overall this shows good compliance with CPAP with excellent control of obstructive sleep apnea.  Social Hx:   Social History   Tobacco Use  . Smoking status: Never Smoker  . Smokeless tobacco: Never Used  Substance Use Topics  . Alcohol use: Yes    Alcohol/week: 0.0  standard drinks    Comment: occassional wine  . Drug use: No   Medication:    Current Outpatient Medications:  .  celecoxib (CELEBREX) 200 MG capsule, Take 1 capsule (200 mg total) by mouth daily. With food, Disp: 90 capsule, Rfl: 3 .  cholecalciferol (VITAMIN D) 1000 units tablet, Take 1,000 Units by mouth daily., Disp: , Rfl:  .  EPINEPHrine 0.3 mg/0.3 mL IJ SOAJ injection, Inject 0.3 mLs (0.3 mg total) into the muscle once., Disp: 1 Device, Rfl: 5 .  ezetimibe (ZETIA) 10 MG tablet, TAKE 1 TABLET BY MOUTH DAILY, Disp: 90 tablet, Rfl: 1 .  Fexofenadine HCl (ALLEGRA PO), Take 1 tablet by mouth daily., Disp: , Rfl:  .  Fish Oil-Cholecalciferol (OMEGA-3 + VITAMIN D3 PO), Take 2 capsules by mouth daily. , Disp: , Rfl:  .  fluconazole (DIFLUCAN) 150 MG tablet, Take one pill by mouth every 3 days for 3 doses, Disp: 3 tablet, Rfl: 0 .  FLUoxetine (PROZAC) 20 MG capsule, Take 1 capsule (20 mg total) by mouth daily., Disp: 90 capsule, Rfl: 3 .  nystatin cream (MYCOSTATIN), Apply 1 application topically 2 (two) times daily. To affected area, Disp: 30 g, Rfl: 0 .  omeprazole (PRILOSEC) 20 MG capsule, Take 20 mg by mouth daily.  , Disp: , Rfl:  .  oxybutynin (OXYTROL) 3.9 MG/24HR, Place 1 patch onto the skin 2 (two) times a week., Disp: 24 patch, Rfl: 3 .  pilocarpine (SALAGEN) 5 MG tablet, TAKE 1 TABLET BY MOUTH THREE TIMES DAILY, Disp: 270 tablet, Rfl: 2 .  valACYclovir (VALTREX) 500 MG tablet, Take 1 tablet (500 mg total) by mouth 2 (two) times daily. As needed for outbreak/rash, Disp: 14 tablet, Rfl: 0   Allergies:  Bee venom and Statins  Review of Systems:  Constitutional: Feels well. Cardiovascular: Denies chest pain, exertional chest pain.  Pulmonary: Denies hemoptysis, pleuritic chest pain.   The remainder of systems were reviewed and were found to be negative other than what is documented in the HPI.    Physical Examination:   VS: BP 128/80 (BP Location: Right Arm, Cuff Size: Large)    Pulse 61   Ht 5' 7.5" (1.715 m)   Wt 240 lb 9.6 oz (109.1 kg)   SpO2 97%   BMI 37.13 kg/m   General Appearance: No distress  Neuro:without focal findings, mental status, speech normal, alert and oriented HEENT: PERRLA, EOM intact Pulmonary: No wheezing, No rales  CardiovascularNormal S1,S2.  No m/r/g.  Abdomen: Benign, Soft, non-tender, No masses Renal:  No costovertebral tenderness  GU:  No performed at this time. Endoc: No evident thyromegaly, no signs of acromegaly or Cushing features Skin:   warm, no rashes, no ecchymosis  Extremities: normal, no cyanosis, clubbing.     LABORATORY PANEL:   CBC No results for input(s): WBC, HGB, HCT, PLT in the last 168 hours. ------------------------------------------------------------------------------------------------------------------  Chemistries  No results for input(s): NA, K, CL, CO2, GLUCOSE, BUN, CREATININE, CALCIUM, MG, AST, ALT, ALKPHOS, BILITOT in the last 168 hours.  Invalid input(s): GFRCGP ------------------------------------------------------------------------------------------------------------------  Cardiac Enzymes No results for input(s): TROPONINI in the last 168 hours. ------------------------------------------------------------  RADIOLOGY:  No results found.     Thank  you for the consultation and for allowing [facility] to assist in the care of your patient. Our recommendations are noted above.  Please contact us if we can be of further service.  Marda Stalker, M.D., F.C.C.P.  Board Certified in Internal Medicine, Pulmonary Medicine, [place], and Sleep Medicine.  [facility] Office Number: [phone]   02/14/2018

## 2018-02-15 ENCOUNTER — Encounter: Payer: Self-pay | Admitting: Internal Medicine

## 2018-02-15 ENCOUNTER — Ambulatory Visit (INDEPENDENT_AMBULATORY_CARE_PROVIDER_SITE_OTHER): Payer: Medicare Other | Admitting: Internal Medicine

## 2018-02-15 VITALS — BP 128/80 | HR 61 | Ht 67.5 in | Wt 240.6 lb

## 2018-02-15 DIAGNOSIS — G4733 Obstructive sleep apnea (adult) (pediatric): Secondary | ICD-10-CM

## 2018-02-15 NOTE — Telephone Encounter (Signed)
Called and left message with [facility] to call back re guarding no SD card and Patient is not on air view.

## 2018-02-15 NOTE — Patient Instructions (Signed)
Continue using cpap every night.  

## 2018-02-19 NOTE — Telephone Encounter (Signed)
Routing message to [facility] [place] office. Patient sees Dr. Ashby Dawes.

## 2018-02-19 NOTE — Telephone Encounter (Signed)
Pt brought report with her to appt on 02/14/18 Nothing further needed.

## 2018-03-06 DIAGNOSIS — R200 Anesthesia of skin: Secondary | ICD-10-CM | POA: Diagnosis not present

## 2018-03-06 DIAGNOSIS — R202 Paresthesia of skin: Secondary | ICD-10-CM | POA: Diagnosis not present

## 2018-03-09 DIAGNOSIS — R200 Anesthesia of skin: Secondary | ICD-10-CM | POA: Diagnosis not present

## 2018-03-09 DIAGNOSIS — G5603 Carpal tunnel syndrome, bilateral upper limbs: Secondary | ICD-10-CM | POA: Diagnosis not present

## 2018-03-09 DIAGNOSIS — R202 Paresthesia of skin: Secondary | ICD-10-CM | POA: Diagnosis not present

## 2018-03-09 DIAGNOSIS — E538 Deficiency of other specified B group vitamins: Secondary | ICD-10-CM | POA: Diagnosis not present

## 2018-03-09 DIAGNOSIS — M159 Polyosteoarthritis, unspecified: Secondary | ICD-10-CM | POA: Diagnosis not present

## 2018-03-09 DIAGNOSIS — M3509 Sicca syndrome with other organ involvement: Secondary | ICD-10-CM | POA: Diagnosis not present

## 2018-03-27 ENCOUNTER — Telehealth: Payer: Self-pay

## 2018-03-27 DIAGNOSIS — E538 Deficiency of other specified B group vitamins: Secondary | ICD-10-CM | POA: Insufficient documentation

## 2018-03-27 NOTE — Telephone Encounter (Signed)
Chart updated and appt scheduled

## 2018-03-27 NOTE — Telephone Encounter (Signed)
Please add b12 deficiency to her problem list and start B12 shots monthly

## 2018-03-27 NOTE — Telephone Encounter (Signed)
Pt left v/m; saw rheumatologist on 02/13/18 and pt was advised B 12 level low and should contact PCP about monthly B 12 injections. Per office note in pts chart on 02/13/18 B 12 level was 289.Please advise. Pt request cb after Dr Glori Bickers reviews note.

## 2018-03-28 ENCOUNTER — Ambulatory Visit (INDEPENDENT_AMBULATORY_CARE_PROVIDER_SITE_OTHER): Payer: Medicare Other | Admitting: *Deleted

## 2018-03-28 DIAGNOSIS — E538 Deficiency of other specified B group vitamins: Secondary | ICD-10-CM

## 2018-03-28 MED ORDER — CYANOCOBALAMIN 1000 MCG/ML IJ SOLN
1000.0000 ug | Freq: Once | INTRAMUSCULAR | Status: AC
  Administered 2018-03-28: 1000 ug via INTRAMUSCULAR

## 2018-03-28 NOTE — Progress Notes (Signed)
.  Per orders of Dr.Tower injection of Vitamin B-12 given by Emelia Salisbury. Patient tolerated injection well.

## 2018-04-02 DIAGNOSIS — G5603 Carpal tunnel syndrome, bilateral upper limbs: Secondary | ICD-10-CM | POA: Diagnosis not present

## 2018-04-11 DIAGNOSIS — G5603 Carpal tunnel syndrome, bilateral upper limbs: Secondary | ICD-10-CM | POA: Diagnosis not present

## 2018-04-17 ENCOUNTER — Encounter (INDEPENDENT_AMBULATORY_CARE_PROVIDER_SITE_OTHER): Payer: Medicare Other | Admitting: Ophthalmology

## 2018-04-17 DIAGNOSIS — H2513 Age-related nuclear cataract, bilateral: Secondary | ICD-10-CM

## 2018-04-17 DIAGNOSIS — H43813 Vitreous degeneration, bilateral: Secondary | ICD-10-CM

## 2018-04-17 DIAGNOSIS — D3131 Benign neoplasm of right choroid: Secondary | ICD-10-CM

## 2018-05-01 ENCOUNTER — Ambulatory Visit: Payer: Medicare Other

## 2018-05-01 ENCOUNTER — Telehealth: Payer: Self-pay | Admitting: Family Medicine

## 2018-05-01 NOTE — Telephone Encounter (Signed)
Pt had b-12 shot schedule for today but was cancelled due to corvid. Please call to advise pt  If she can come in soon or if when.

## 2018-05-01 NOTE — Telephone Encounter (Signed)
Will wait to f/u with  Pt once we know what we are doing regarding nurse visits after provider meeting today

## 2018-05-02 NOTE — Telephone Encounter (Signed)
appt scheduled for tomorrow, I will do the b12 in her car, negative Covid-19 screening

## 2018-05-03 ENCOUNTER — Ambulatory Visit (INDEPENDENT_AMBULATORY_CARE_PROVIDER_SITE_OTHER): Payer: Medicare Other | Admitting: *Deleted

## 2018-05-03 ENCOUNTER — Other Ambulatory Visit: Payer: Self-pay

## 2018-05-03 DIAGNOSIS — E538 Deficiency of other specified B group vitamins: Secondary | ICD-10-CM

## 2018-05-03 MED ORDER — CYANOCOBALAMIN 1000 MCG/ML IJ SOLN
1000.0000 ug | Freq: Once | INTRAMUSCULAR | Status: AC
  Administered 2018-05-03: 1000 ug via INTRAMUSCULAR

## 2018-05-03 NOTE — Progress Notes (Signed)
Per orders of Dr. Einar Pheasant, injection of B12 given by Tammi Sou. Patient tolerated injection well.  Routed to Dr. Einar Pheasant due to PCP being out of the office

## 2018-06-07 ENCOUNTER — Other Ambulatory Visit: Payer: Self-pay

## 2018-06-07 ENCOUNTER — Ambulatory Visit (INDEPENDENT_AMBULATORY_CARE_PROVIDER_SITE_OTHER): Payer: Medicare Other

## 2018-06-07 DIAGNOSIS — E538 Deficiency of other specified B group vitamins: Secondary | ICD-10-CM | POA: Diagnosis not present

## 2018-06-07 MED ORDER — CYANOCOBALAMIN 1000 MCG/ML IJ SOLN
1000.0000 ug | Freq: Once | INTRAMUSCULAR | Status: AC
  Administered 2018-06-07: 16:00:00 1000 ug via INTRAMUSCULAR

## 2018-06-07 NOTE — Progress Notes (Signed)
Per orders of Dr. Glori Bickers (co singed by Dr. Damita Dunnings in Dr. Marliss Coots absence), injection of b12 given by Kris Mouton. Patient tolerated injection well.

## 2018-06-08 ENCOUNTER — Other Ambulatory Visit: Payer: Self-pay | Admitting: *Deleted

## 2018-06-08 MED ORDER — EZETIMIBE 10 MG PO TABS: 10.0000 mg | ORAL_TABLET | Freq: Every day | ORAL | 1 refills | Status: DC

## 2018-06-19 DIAGNOSIS — H04123 Dry eye syndrome of bilateral lacrimal glands: Secondary | ICD-10-CM | POA: Diagnosis not present

## 2018-06-19 DIAGNOSIS — H2513 Age-related nuclear cataract, bilateral: Secondary | ICD-10-CM | POA: Diagnosis not present

## 2018-06-19 DIAGNOSIS — H25812 Combined forms of age-related cataract, left eye: Secondary | ICD-10-CM | POA: Diagnosis not present

## 2018-06-19 DIAGNOSIS — Z853 Personal history of malignant neoplasm of breast: Secondary | ICD-10-CM | POA: Diagnosis not present

## 2018-06-19 DIAGNOSIS — D3131 Benign neoplasm of right choroid: Secondary | ICD-10-CM | POA: Diagnosis not present

## 2018-06-19 DIAGNOSIS — H2511 Age-related nuclear cataract, right eye: Secondary | ICD-10-CM | POA: Diagnosis not present

## 2018-07-11 ENCOUNTER — Other Ambulatory Visit: Payer: Self-pay

## 2018-07-11 ENCOUNTER — Ambulatory Visit (INDEPENDENT_AMBULATORY_CARE_PROVIDER_SITE_OTHER): Payer: Medicare Other

## 2018-07-11 DIAGNOSIS — E538 Deficiency of other specified B group vitamins: Secondary | ICD-10-CM | POA: Diagnosis not present

## 2018-07-11 MED ORDER — CYANOCOBALAMIN 1000 MCG/ML IJ SOLN
1000.0000 ug | Freq: Once | INTRAMUSCULAR | Status: AC
  Administered 2018-07-11: 1000 ug via INTRAMUSCULAR

## 2018-07-11 NOTE — Progress Notes (Signed)
Per orders of Dr. Glori Bickers, injection of Vitamin B12 given by Diamond Nickel, RN.  Administered to Left deltoid IM.   Patient tolerated injection well.

## 2018-07-20 ENCOUNTER — Ambulatory Visit (INDEPENDENT_AMBULATORY_CARE_PROVIDER_SITE_OTHER): Payer: Medicare Other | Admitting: Family Medicine

## 2018-07-20 ENCOUNTER — Encounter: Payer: Self-pay | Admitting: Family Medicine

## 2018-07-20 DIAGNOSIS — R21 Rash and other nonspecific skin eruption: Secondary | ICD-10-CM

## 2018-07-20 MED ORDER — TRIAMCINOLONE ACETONIDE 0.1 % EX CREA: 1.0000 "application " | TOPICAL_CREAM | Freq: Two times a day (BID) | CUTANEOUS | 0 refills | Status: DC

## 2018-07-20 NOTE — Progress Notes (Signed)
Virtual Visit via Video Note  I connected with Lauren [name] on 07/20/18 at  3:45 PM EDT by a video enabled telemedicine application and verified that I am speaking with the correct person using two identifiers.  Location: Patient: home Provider: office    I discussed the limitations of evaluation and management by telemedicine and the availability of in person appointments. The patient expressed understanding and agreed to proceed.  History of Present Illness: Noticed a rash on leg last night   Back of her leg R  No itching  Not painful  No trauma if she touches it not tender  Does not blanche  Not raised  Not scaly or dry No swelling   [place]  Is outdoors a bit   No hives /whelps   Had poison ivy in the past w/o itching   Takes celebrex Takes zyrtec for allergies   Review of Systems  Constitutional: Negative for chills, fever, malaise/fatigue and weight loss.  HENT: Negative for sore throat.   Eyes: Negative for discharge and redness.  Respiratory: Negative for cough and wheezing.   Cardiovascular: Negative for chest pain.  Musculoskeletal: Negative for myalgias.  Skin: Positive for rash. Negative for itching.  Neurological: Negative for dizziness, sensory change and headaches.  Endo/Heme/Allergies: Does not bruise/bleed easily.      Patient Active Problem List   Diagnosis Date Noted  . Rash and nonspecific skin eruption 07/20/2018  . Vitamin B12 deficiency 03/27/2018  . Sjogren's disease ([place]) 01/15/2018  . Osteoarthritis 01/15/2018  . Obesity (BMI 30-39.9) 01/15/2018  . H/O herpes zoster 11/22/2017  . Estrogen deficiency 01/09/2017  . Routine general medical examination at a health care facility 01/01/2017  . OSA (obstructive sleep apnea) 09/18/2016  . Somnolence, daytime 09/18/2016  . Colon cancer screening 12/24/2014  . Bee sting allergy 06/10/2014  . Encounter for Medicare annual wellness exam 12/03/2013  .  History of shingles 11/27/2013  . Hx of breast cancer 08/22/2011  . Gynecological examination 02/14/2011  . Bilateral dry eyes 01/20/2011  . Vitamin D deficiency 04/22/2008  . Hyperlipidemia 04/22/2008  . GERD 04/22/2008  . Prediabetes 04/22/2008   Past Medical History:  Diagnosis Date  . Breast cancer ([place])    DCIS; BRCA 1 pos HER2 pos, ER2 pos  . DDD (degenerative disc disease)   . GERD (gastroesophageal reflux disease)   . HLD (hyperlipidemia)    statin intolerant  . Hyperglycemia 9/09   A1C elvated to 6.2  . Osteoarthritis   . Osteopenia 2007   ? from xray with nl dexa  . Reactive depression ([place])    very well controlled, and now takes prozac for sleep  . Sjogren's syndrome ([place])   . Urine incontinence   . Vitamin D deficiency    Past Surgical History:  Procedure Laterality Date  . BILATERAL OOPHORECTOMY  205  . COLONOSCOPY  5/07   normal; recheck 3 years  . LUMBAR DISC SURGERY  1991   Ruptured disk L5  . MASTECTOMY  2001   bilateral  . TONSILLECTOMY  1974  . TOTAL HIP ARTHROPLASTY  2011  . TOTAL KNEE ARTHROPLASTY  2006   bilateral   Social History   Tobacco Use  . Smoking status: Never Smoker  . Smokeless tobacco: Never Used  Substance Use Topics  . Alcohol use: Yes    Alcohol/week: 0.0 standard drinks    Comment: occassional wine  . Drug use: No   Family History  Problem  Relation Age of Onset  . Arthritis Unknown        family hx  . Breast cancer Unknown        1st degree relative <50  . Diabetes Unknown        1st degree relative  . Hyperlipidemia Unknown        family hx  . Hypertension Unknown        family hx  . Ovarian cancer Unknown        family hx  . Prostate cancer Unknown        1st degree relative <50  . Stroke Unknown        family hx  . Dementia Mother   . Psoriasis Brother   . Other Daughter        BRCA gene  . Brain cancer Brother    Allergies  Allergen Reactions  . Bee Venom   . Statins     REACTION: joint  pain   Current Outpatient Medications on File Prior to Visit  Medication Sig Dispense Refill  . celecoxib (CELEBREX) 200 MG capsule Take 1 capsule (200 mg total) by mouth daily. With food 90 capsule 3  . cholecalciferol (VITAMIN D) 1000 units tablet Take 1,000 Units by mouth daily.    . Cyanocobalamin 1000 MCG/ML KIT Inject 1 mL as directed every 30 (thirty) days.    Marland Kitchen EPINEPHrine 0.3 mg/0.3 mL IJ SOAJ injection Inject 0.3 mLs (0.3 mg total) into the muscle once. 1 Device 5  . ezetimibe (ZETIA) 10 MG tablet Take 1 tablet (10 mg total) by mouth daily. 90 tablet 1  . Fexofenadine HCl (ALLEGRA PO) Take 1 tablet by mouth daily.    . Fish Oil-Cholecalciferol (OMEGA-3 + VITAMIN D3 PO) Take 2 capsules by mouth daily.     Marland Kitchen FLUoxetine (PROZAC) 20 MG capsule Take 1 capsule (20 mg total) by mouth daily. 90 capsule 3  . omeprazole (PRILOSEC) 20 MG capsule Take 20 mg by mouth daily.      Marland Kitchen oxybutynin (OXYTROL) 3.9 MG/24HR Place 1 patch onto the skin 2 (two) times a week. 24 patch 3  . pilocarpine (SALAGEN) 5 MG tablet TAKE 1 TABLET BY MOUTH THREE TIMES DAILY 270 tablet 2   No current facility-administered medications on file prior to visit.     Observations/Objective: Limited visualization due to screen quality  Erythematous to purple area-oval in shape on right calf  No obvious blisters or excoriations  Per pt not warm /does not blanch and is not tender  No obvious varicosities in the area  It does not resemble whelps  Pt appears well  Nl cognition and affect  No cough or sob or hoarseness    Assessment and Plan: Problem List Items Addressed This Visit      Musculoskeletal and Integument   Rash and nonspecific skin eruption    Posterior lower right leg  Not painful or itchy  Somewhat purple/red   Does not blanche when pt touches it and non tender Interestingly she has had poison ivy w/o itching in the past and [place]may have been exposed)  Will tx as allergic  dermatitis with triamcinolone cream inst to keep area clean (soap and water) cool and dry  inst to call if worse or not improving (and update early next week regardless so we know if this improves with the tx  If worse or not improved will need to see her in the office  She voiced understanding  and will update [place] early next week  Meds ordered this encounter  Medications  . triamcinolone cream (KENALOG) 0.1 %    Sig: Apply 1 application topically 2 (two) times daily. To affected rash areas    Dispense:  30 g    Refill:  0             Follow Up Instructions: Keep the rash area as clean and dry as possible  Let us know if worse/spreading/itchy or painful   Try the triamcinolone cream twice daily  Try and avoid poison ivy and other irritants (also sun exposure on the area) Please call us early next week with update on how it progresses If worse or not improved we will make you an appointment to be seen in the office    I discussed the assessment and treatment plan with the patient. The patient was provided an opportunity to ask questions and all were answered. The patient agreed with the plan and demonstrated an understanding of the instructions.   The patient was advised to call back or seek an in-person evaluation if the symptoms worsen or if the condition fails to improve as anticipated.    Loura Pardon, MD

## 2018-07-20 NOTE — Patient Instructions (Signed)
Keep the rash area as clean and dry as possible  Let us know if worse/spreading/itchy or painful   Try the triamcinolone cream twice daily  Try and avoid poison ivy and other irritants (also sun exposure on the area) Please call us early next week with update on how it progresses If worse or not improved we will make you an appointment to be seen in the office

## 2018-07-20 NOTE — Assessment & Plan Note (Addendum)
Posterior lower right leg  Not painful or itchy  Somewhat purple/red   Does not blanche when Lauren [name] touches it and non tender Interestingly Lauren [name] has had poison ivy w/o itching in the past and [place]may have been exposed[place]  Will tx as allergic dermatitis with triamcinolone cream inst to keep area clean (soap and water) cool and dry  inst to call if worse or not improving (and update early next week regardless so we know if this improves with the tx  If worse or not improved will need to see Lauren [name] in the office  Lauren [name] voiced understanding and will update [place] early next week  Meds ordered this encounter  Medications  . triamcinolone cream (KENALOG) 0.1 %    Sig: Apply 1 application topically 2 (two) times daily. To affected rash areas    Dispense:  30 g    Refill:  0

## 2018-08-16 ENCOUNTER — Other Ambulatory Visit: Payer: Self-pay

## 2018-08-16 ENCOUNTER — Ambulatory Visit (INDEPENDENT_AMBULATORY_CARE_PROVIDER_SITE_OTHER): Payer: Medicare Other

## 2018-08-16 DIAGNOSIS — E538 Deficiency of other specified B group vitamins: Secondary | ICD-10-CM

## 2018-08-16 MED ORDER — CYANOCOBALAMIN 1000 MCG/ML IJ SOLN
1000.0000 ug | Freq: Once | INTRAMUSCULAR | Status: AC
  Administered 2018-08-16: 1000 ug via INTRAMUSCULAR

## 2018-08-16 NOTE — Progress Notes (Signed)
Per orders of Dr. Tower, injection of vit B12 given by Karli Wickizer. Patient tolerated injection well.  

## 2018-09-07 DIAGNOSIS — M3509 Sicca syndrome with other organ involvement: Secondary | ICD-10-CM | POA: Diagnosis not present

## 2018-09-07 DIAGNOSIS — M159 Polyosteoarthritis, unspecified: Secondary | ICD-10-CM | POA: Diagnosis not present

## 2018-09-07 DIAGNOSIS — E538 Deficiency of other specified B group vitamins: Secondary | ICD-10-CM | POA: Diagnosis not present

## 2018-09-07 DIAGNOSIS — G5603 Carpal tunnel syndrome, bilateral upper limbs: Secondary | ICD-10-CM | POA: Diagnosis not present

## 2018-09-25 ENCOUNTER — Ambulatory Visit (INDEPENDENT_AMBULATORY_CARE_PROVIDER_SITE_OTHER): Payer: Medicare Other

## 2018-09-25 DIAGNOSIS — E538 Deficiency of other specified B group vitamins: Secondary | ICD-10-CM | POA: Diagnosis not present

## 2018-09-25 MED ORDER — CYANOCOBALAMIN 1000 MCG/ML IJ SOLN
1000.0000 ug | Freq: Once | INTRAMUSCULAR | Status: AC
  Administered 2018-09-25: 1000 ug via INTRAMUSCULAR

## 2018-09-25 NOTE — Progress Notes (Signed)
Pt came for monthly B12 injection.   There are no labs in her chart. The order by Dr Glori Bickers was found in a telephone encounter from 03/27/18.  Given in left deltoid. Pt tolerated it well.

## 2018-10-01 ENCOUNTER — Other Ambulatory Visit: Payer: Self-pay | Admitting: Family Medicine

## 2018-10-30 ENCOUNTER — Ambulatory Visit (INDEPENDENT_AMBULATORY_CARE_PROVIDER_SITE_OTHER): Payer: Medicare Other

## 2018-10-30 DIAGNOSIS — E538 Deficiency of other specified B group vitamins: Secondary | ICD-10-CM | POA: Diagnosis not present

## 2018-10-30 DIAGNOSIS — Z23 Encounter for immunization: Secondary | ICD-10-CM

## 2018-10-30 MED ORDER — CYANOCOBALAMIN 1000 MCG/ML IJ SOLN
1000.0000 ug | Freq: Once | INTRAMUSCULAR | Status: AC
  Administered 2018-10-30: 1000 ug via INTRAMUSCULAR

## 2018-10-30 NOTE — Progress Notes (Signed)
Per orders of Dr. Glori Bickers, injection of Vitamin B12 given by Diamond Nickel, RN administered to right deltoid. Patient tolerated injection well.

## 2018-11-27 ENCOUNTER — Other Ambulatory Visit: Payer: Self-pay | Admitting: Family Medicine

## 2018-11-27 MED ORDER — PILOCARPINE HCL 5 MG PO TABS: 5.0000 mg | ORAL_TABLET | Freq: Three times a day (TID) | ORAL | 0 refills | Status: DC

## 2018-11-27 NOTE — Telephone Encounter (Signed)
CPE scheduled 01/25/19

## 2018-11-27 NOTE — Telephone Encounter (Signed)
Patient called.  Patient needs a refill on Pilocarpine 5 mg 3 x a day for 3 mths.  Patient needs a new rx sent to [facility].

## 2018-12-10 DIAGNOSIS — H10022 Other mucopurulent conjunctivitis, left eye: Secondary | ICD-10-CM | POA: Diagnosis not present

## 2018-12-28 ENCOUNTER — Other Ambulatory Visit: Payer: Self-pay

## 2019-01-20 ENCOUNTER — Telehealth: Payer: Self-pay | Admitting: Family Medicine

## 2019-01-20 DIAGNOSIS — E7800 Pure hypercholesterolemia, unspecified: Secondary | ICD-10-CM

## 2019-01-20 DIAGNOSIS — Z0000 Encounter for general adult medical examination without abnormal findings: Secondary | ICD-10-CM

## 2019-01-20 DIAGNOSIS — R7303 Prediabetes: Secondary | ICD-10-CM

## 2019-01-20 DIAGNOSIS — E559 Vitamin D deficiency, unspecified: Secondary | ICD-10-CM

## 2019-01-20 DIAGNOSIS — E538 Deficiency of other specified B group vitamins: Secondary | ICD-10-CM

## 2019-01-20 NOTE — Telephone Encounter (Signed)
-----   Message from Ellamae Sia sent at 01/14/2019  3:04 PM EST ----- Regarding: Lab orders for Monday, 12.14.20 Patient is scheduled for CPX labs, please order future labs, Thanks , [name]

## 2019-01-21 ENCOUNTER — Other Ambulatory Visit (INDEPENDENT_AMBULATORY_CARE_PROVIDER_SITE_OTHER): Payer: Medicare Other

## 2019-01-21 ENCOUNTER — Ambulatory Visit (INDEPENDENT_AMBULATORY_CARE_PROVIDER_SITE_OTHER): Payer: Medicare Other

## 2019-01-21 ENCOUNTER — Ambulatory Visit: Payer: Medicare Other

## 2019-01-21 ENCOUNTER — Other Ambulatory Visit: Payer: Self-pay

## 2019-01-21 VITALS — Wt 255.0 lb

## 2019-01-21 DIAGNOSIS — E538 Deficiency of other specified B group vitamins: Secondary | ICD-10-CM | POA: Diagnosis not present

## 2019-01-21 DIAGNOSIS — Z0000 Encounter for general adult medical examination without abnormal findings: Secondary | ICD-10-CM

## 2019-01-21 DIAGNOSIS — R7303 Prediabetes: Secondary | ICD-10-CM

## 2019-01-21 DIAGNOSIS — E7800 Pure hypercholesterolemia, unspecified: Secondary | ICD-10-CM | POA: Diagnosis not present

## 2019-01-21 DIAGNOSIS — E559 Vitamin D deficiency, unspecified: Secondary | ICD-10-CM

## 2019-01-21 LAB — LIPID PANEL
Cholesterol: 185 mg/dL (ref 0–200)
HDL: 48.8 mg/dL (ref >39.00)
LDL Cholesterol: 116 mg/dL — ABNORMAL HIGH (ref 0–99)
NonHDL: 135.77
Total CHOL/HDL Ratio: 4
Triglycerides: 98 mg/dL (ref 0.0–149.0)
VLDL: 19.6 mg/dL (ref 0.0–40.0)

## 2019-01-21 LAB — COMPREHENSIVE METABOLIC PANEL
ALT: 13 U/L (ref 0–35)
AST: 14 U/L (ref 0–37)
Albumin: 4.1 g/dL (ref 3.5–5.2)
Alkaline Phosphatase: 79 U/L (ref 39–117)
BUN: 16 mg/dL (ref 6–23)
CO2: 24 mEq/L (ref 19–32)
Calcium: 8.9 mg/dL (ref 8.4–10.5)
Chloride: 104 mEq/L (ref 96–112)
Creatinine, Ser: 0.6 mg/dL (ref 0.40–1.20)
GFR: 97.79 mL/min (ref >60.00)
Glucose, Bld: 117 mg/dL — ABNORMAL HIGH (ref 70–99)
Potassium: 3.9 mEq/L (ref 3.5–5.1)
Sodium: 138 mEq/L (ref 135–145)
Total Bilirubin: 0.5 mg/dL (ref 0.2–1.2)
Total Protein: 6.7 g/dL (ref 6.0–8.3)

## 2019-01-21 LAB — HEMOGLOBIN A1C: Hgb A1c MFr Bld: 6.1 % (ref 4.6–6.5)

## 2019-01-21 LAB — VITAMIN B12: Vitamin B-12: 387 pg/mL (ref 211–911)

## 2019-01-21 LAB — VITAMIN D 25 HYDROXY (VIT D DEFICIENCY, FRACTURES): VITD: 47.68 ng/mL (ref 30.00–100.00)

## 2019-01-21 LAB — TSH: TSH: 0.81 u[IU]/mL (ref 0.35–4.50)

## 2019-01-21 NOTE — Progress Notes (Addendum)
Subjective:   Desta Bujak is a 73 y.o. female who presents for Medicare Annual (Subsequent) preventive examination.  Review of Systems: N/A   This visit is being conducted through telemedicine via telephone at the nurse health advisor's home address due to the COVID-19 pandemic. This patient has given me verbal consent via doximity to conduct this visit, patient states they are participating from their home address. Patient and myself are on the telephone call. There is no referral for this visit. Some vital signs may be absent or patient reported.    Patient identification: identified by name, DOB, and current address   Cardiac Risk Factors include: advanced age (>29mn, >>51women);dyslipidemia     Objective:     Vitals: Wt 255 lb (115.7 kg)    BMI 39.35 kg/m   Body mass index is 39.35 kg/m.  Advanced Directives 01/21/2019 01/11/2018 01/04/2017 12/22/2015  Does Patient Have a Medical Advance Directive? Yes Yes Yes Yes  Type of AParamed[place]of [place]Living will [place] will [place] will [place] will  Does patient want to make changes to medical advance directive? - - - No - Patient declined  Copy of [place] Chart? No - copy requested No - copy requested No - copy requested No - copy requested    Tobacco Social History   Tobacco Use  Smoking Status Never Smoker  Smokeless Tobacco Never Used     Counseling given: Not Answered   Clinical Intake:  Pre-visit preparation completed: Yes  Pain : 0-10 Pain Score: 2  Pain Type: Chronic pain Pain Location: Hand(carpal tunnel) Pain Orientation: Right, Left Pain Descriptors / Indicators: Aching Pain Onset: More than a month ago Pain Frequency: Intermittent     Nutritional Risks: None Diabetes: No  How often do you need to have someone help you when you read instructions, pamphlets, or  other written materials from your doctor or pharmacy?: 1 - Never What is the last grade level you completed in school?: post masters  Interpreter Needed?: No  Information entered by :: CJohnson, LPN  Past Medical History:  Diagnosis Date   [place]ast cancer ([place]    DCIS; BRCA 1 pos HER2 pos, ER2 pos   DDD (degenerative disc disease)    GERD (gastroesophageal reflux disease)    HLD (hyperlipidemia)    statin intolerant   Hyperglycemia 9/09   A1C elvated to 6.2   Osteoarthritis    Osteopenia 2007   ? from xray with nl dexa   Reactive depression (situational)    very well controlled, and now takes prozac for sleep   Sjogren's syndrome ([place]    Urine incontinence    Vitamin D deficiency    Past Surgical History:  Procedure Laterality Date   BILATERAL OOPHORECTOMY  205   COLONOSCOPY  5/07   normal; [place]  Ruptured disk L5   MASTECTOMY  2001   bilateral   TONSILLECTOMY  1974   TOTAL HIP ARTHROPLASTY  2011   TOTAL KNEE ARTHROPLASTY  2006   bilateral   Family History  Problem Relation Age of Onset   Arthritis Other        family hx   Breast cancer Other        1st degree relative <50   Diabetes Other        1st degree relative   Hyperlipidemia Other  family hx   Hypertension Other        family hx   Ovarian cancer Other        family hx   Prostate cancer Other        1st degree relative <50   Stroke Other        family hx   Dementia Mother    Psoriasis Brother    Other Daughter        BRCA gene   Brain cancer Brother    Social History   Socioeconomic History   Marital status: Widowed    Spouse name: Not on file   Number of children: 3   Years of education: Not on file   Highest education level: Not on file  Occupational History   Occupation: Retired  Tobacco Use   Smoking status: Never Smoker   Smokeless tobacco: Never Used  Substance and Sexual Activity   Alcohol use:  Yes    Alcohol/week: 0.0 standard drinks    Comment: occassional wine   Drug use: No   Sexual activity: Not Currently  Other Topics Concern   Not on file  Social History Narrative   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      Regular exercise-yes   Social Determinants of Health   Financial Resource Strain:    Difficulty of Paying Living Expenses: Not on file  Food Insecurity: No Food Insecurity   Worried About Charity fundraiser in the Last Year: Never true   Arboriculturist in the Last Year: Never true  Transportation Needs: No Transportation Needs   Lack of Transportation (Medical): No   Lack of Transportation (Non-Medical): No  Physical Activity: Inactive   Days of Exercise per Week: 0 days   Minutes of Exercise per Session: 0 min  Stress: No Stress Concern Present   Feeling of Stress : Not at all  Social Connections:    Frequency of Communication with Friends and Family: Not on file   Frequency of Social Gatherings with Friends and Family: Not on file   Attends Religious Services: Not on file   Active Member of Clubs or Organizations: Not on file   Attends Archivist Meetings: Not on file   Marital Status: Not on file    Outpatient Encounter Medications as of 01/21/2019  Medication Sig   celecoxib (CELEBREX) 200 MG capsule Take 1 capsule (200 mg total) by mouth daily. With food   cholecalciferol (VITAMIN D) 1000 units tablet Take 1,000 Units by mouth daily.   Cyanocobalamin 1000 MCG/ML KIT Inject 1 mL as directed every 30 (thirty) days.   EPINEPHrine 0.3 mg/0.3 mL IJ SOAJ injection Inject 0.3 mLs (0.3 mg total) into the muscle once.   ezetimibe (ZETIA) 10 MG tablet Take 1 tablet by mouth once daily   Fexofenadine HCl (ALLEGRA PO) Take 1 tablet by mouth daily.   Fish Oil-Cholecalciferol (OMEGA-3 + VITAMIN D3 PO) Take 2 capsules by mouth daily.    FLUoxetine (PROZAC) 20 MG capsule Take 1  capsule (20 mg total) by mouth daily.   omeprazole (PRILOSEC) 20 MG capsule Take 20 mg by mouth daily.     oxybutynin (OXYTROL) 3.9 MG/24HR Place 1 patch onto the skin 2 (two) times a week.   pilocarpine (SALAGEN) 5 MG tablet Take 1 tablet (5 mg total) by mouth 3 (three) times daily.   triamcinolone cream (  KENALOG) 0.1 % Apply 1 application topically 2 (two) times daily. To affected rash areas   valACYclovir (VALTREX) 500 MG tablet TAKE 1 TABLET BY MOUTH TWICE DAILY AS NEEDED FOR OUTBREAK/RASH   No facility-administered encounter medications on file as of 01/21/2019.    Activities of Daily Living In your present state of health, do you have any difficulty performing the following activities: 01/21/2019  Hearing? N  Vision? N  Difficulty concentrating or making decisions? N  Walking or climbing stairs? N  Dressing or bathing? N  Doing errands, shopping? N  Preparing Food and eating ? N  Using the Toilet? N  In the past six months, have you accidently leaked urine? N  Do you have problems with loss of bowel control? N  Managing your Medications? N  Managing your Finances? N  Housekeeping or managing your Housekeeping? N  Some recent data might be hidden    Patient Care Team: Tower, Wynelle Fanny, MD as PCP - General Sharol Roussel, OD as Physician Assistant (Optometry) Lorelee Cover., MD as Referring Physician (Ophthalmology) Laverle Hobby, MD as Consulting Physician (Pulmonary Disease) Marc Morgans, DMD, PA as Referring Physician (Dentistry)    Assessment:   This is a routine wellness examination for Naelani.  Exercise Activities and Dietary recommendations Current Exercise Habits: Home exercise routine, Type of exercise: walking, Time (Minutes): 20, Frequency (Times/Week): 7, Weekly Exercise (Minutes/Week): 140, Intensity: Mild, Exercise limited by: None identified  Goals     Increase physical activity     Starting 01/11/2018, I will continue to walk at least 20 min  daily.      Patient Stated     01/21/2019, I will try to eat a healthier diet and lessen my sugar intake.        Fall Risk Fall Risk  01/21/2019 01/11/2018 01/04/2017 01/14/2016 12/22/2015  Falls in the past year? 0 0 No No No  Comment - - - Emmi Telephone Survey: data to providers prior to load -  Number falls in past yr: 0 - - - -  Injury with Fall? 0 - - - -  Risk for fall due to : Medication side effect - - - -  Follow up Falls evaluation completed;Falls prevention discussed - - - -   Is the patient's home free of loose throw rugs in walkways, pet beds, electrical cords, etc?   yes      Grab bars in the bathroom? yes      Handrails on the stairs?   yes      Adequate lighting?   yes  Timed Get Up and Go performed: N/A  Depression Screen PHQ 2/9 Scores 01/21/2019 01/11/2018 01/04/2017 12/22/2015  PHQ - 2 Score 0 0 0 0  PHQ- 9 Score 0 0 0 -     Cognitive Function MMSE - Mini Mental State Exam 01/21/2019 01/11/2018 01/04/2017 12/22/2015  Orientation to time '5 5 5 5  ' Orientation to Place '5 5 5 5  ' Registration '3 3 3 3  ' Attention/ Calculation 5 0 0 0  Recall '3 3 3 3  ' Language- name 2 objects - 0 0 0  Language- repeat '1 1 1 1  ' Language- follow 3 step command - '3 3 3  ' Language- read & follow direction - 0 0 0  Write a sentence - 0 0 0  Copy design - 0 0 0  Total score - '20 20 20  ' Mini Cog  Mini-Cog screen was completed. Maximum score is 22. A value of  0 denotes this part of the MMSE was not completed or the patient failed this part of the Mini-Cog screening.       Immunization History  Administered Date(s) Administered   HPV Bivalent 11/10/2016   Influenza Split 11/26/2010   Influenza Whole 12/06/2009   Influenza, Seasonal, Injecte, Preservative Fre 10/23/2015   Influenza,inj,Quad PF,6+ Mos 11/26/2013, 11/10/2014, 11/10/2016, 11/22/2017, 10/30/2018   Pneumococcal Conjugate-13 12/24/2014, 05/31/2016   Pneumococcal Polysaccharide-23 01/19/2010, 01/08/2018    Td 10/13/2006   Zoster 02/07/2005   Zoster Recombinat (Shingrix) 05/31/2016, 08/23/2016    Qualifies for Shingles Vaccine? Completed series   Screening Tests Health Maintenance  Topic Date Due   MAMMOGRAM  03/11/2020 (Originally 04/16/1995)   PAP SMEAR-Modifier  01/05/2024 (Originally 02/14/2012)   Fecal DNA (Cologuard)  09/08/2019   TETANUS/TDAP  07/08/2025   INFLUENZA VACCINE  Completed   DEXA SCAN  Completed   Hepatitis C Screening  Completed   PNA vac Low Risk Adult  Completed    Cancer Screenings: Lung: Low Dose CT Chest recommended if Age 44-80 years, 30 pack-year currently smoking OR have quit w/in 15years. Patient does not qualify. Breast:  Up to date on Mammogram? N/A, double masectomy   Up to date of Bone Density/Dexa? Yes, completed 02/20/2017 Colorectal: Cologuard completed 09/07/2016  Additional Screenings:  Hepatitis C Screening: 02/13/2018     Plan:   Patient will eat a healthier diet and decrease sugar intake.  I have personally reviewed and noted the following in the patients chart:    Medical and social history  Use of alcohol, tobacco or illicit drugs   Current medications and supplements  Functional ability and status  Nutritional status  Physical activity  Advanced directives  List of other physicians  Hospitalizations, surgeries, and ER visits in previous 12 months  Vitals  Screenings to include cognitive, depression, and falls  Referrals and appointments  In addition, I have reviewed and discussed with patient certain preventive protocols, quality metrics, and best practice recommendations. A written personalized care plan for preventive services as well as general preventive health recommendations were provided to patient.     Andrez Grime, LPN  [id number]

## 2019-01-21 NOTE — Patient Instructions (Signed)
[name] , Thank you for taking time to come for your Medicare Wellness Visit. I appreciate your ongoing commitment to your health goals. Please review the following plan we discussed and let me know if I can assist you in the future.   Screening recommendations/referrals: Colonoscopy: Up to date, completed Cologuard 09/07/2016 Mammogram: not indicated Bone Density: Up to date, completed 02/20/2017 Recommended yearly ophthalmology/optometry visit for glaucoma screening and checkup Recommended yearly dental visit for hygiene and checkup  Vaccinations: Influenza vaccine: Up to date, completed 10/30/2018 Pneumococcal vaccine: Completed series Tdap vaccine: Up to date, completed 07/09/2015 Shingles vaccine: Completed series    Advanced directives: Please bring a copy of your POA (Power of [place]) and/or Living Will to your next appointment.   Conditions/risks identified: hyperlipidemia, hyperglycemia  Next appointment: 01/25/2019 @ 8:30 am    Preventive Care 65 Years and Older, Female Preventive care refers to lifestyle choices and visits with your health care provider that can promote health and wellness. What does preventive care include?  A yearly physical exam. This is also called an annual well check.  Dental exams once or twice a year.  Routine eye exams. Ask your health care provider how often you should have your eyes checked.  Personal lifestyle choices, including:  Daily care of your teeth and gums.  Regular physical activity.  Eating a healthy diet.  Avoiding tobacco and drug use.  Limiting alcohol use.  Practicing safe sex.  Taking low-dose aspirin every day.  Taking vitamin and mineral supplements as recommended by your health care provider. What happens during an annual well check? The services and screenings done by your health care provider during your annual well check will depend on your age, overall health, lifestyle risk factors, and family history of  disease. Counseling  Your health care provider may ask you questions about your:  Alcohol use.  Tobacco use.  Drug use.  Emotional well-being.  Home and relationship well-being.  Sexual activity.  Eating habits.  History of falls.  Memory and ability to understand (cognition).  Work and work Statistician.  Reproductive health. Screening  You may have the following tests or measurements:  Height, weight, and BMI.  Blood pressure.  Lipid and cholesterol levels. These may be checked every 5 years, or more frequently if you are over 40 years old.  Skin check.  Lung cancer screening. You may have this screening every year starting at age 27 if you have a 30-pack-year history of smoking and currently smoke or have quit within the past 15 years.  Fecal occult blood test (FOBT) of the stool. You may have this test every year starting at age 65.  Flexible sigmoidoscopy or colonoscopy. You may have a sigmoidoscopy every 5 years or a colonoscopy every 10 years starting at age 60.  Hepatitis C blood test.  Hepatitis B blood test.  Sexually transmitted disease (STD) testing.  Diabetes screening. This is done by checking your blood sugar (glucose) after you have not eaten for a while (fasting). You may have this done every 1-3 years.  Bone density scan. This is done to screen for osteoporosis. You may have this done starting at age 71.  Mammogram. This may be done every 1-2 years. Talk to your health care provider about how often you should have regular mammograms. Talk with your health care provider about your test results, treatment options, and if necessary, the need for more tests. Vaccines  Your health care provider may recommend certain vaccines, such as:  Influenza vaccine. This is recommended every year.  Tetanus, diphtheria, and acellular pertussis (Tdap, Td) vaccine. You may need a Td booster every 10 years.  Zoster vaccine. You may need this after age  7.  Pneumococcal 13-valent conjugate (PCV13) vaccine. One dose is recommended after age 54.  Pneumococcal polysaccharide (PPSV23) vaccine. One dose is recommended after age 41. Talk to your health care provider about which screenings and vaccines you need and how often you need them. This information is not intended to replace advice given to you by your health care provider. Make sure you discuss any questions you have with your health care provider. Document Released: 02/20/2015 Document Revised: 10/14/2015 Document Reviewed: 11/25/2014 Elsevier Interactive Patient Education  2017 [place] Prevention in the Home Falls can cause injuries. They can happen to people of all ages. There are many things you can do to make your home safe and to help prevent falls. What can I do on the outside of my home?  Regularly fix the edges of walkways and driveways and fix any cracks.  Remove anything that might make you trip as you walk through a door, such as a raised step or threshold.  Trim any bushes or trees on the path to your home.  Use bright outdoor lighting.  Clear any walking paths of anything that might make someone trip, such as rocks or tools.  Regularly check to see if handrails are loose or broken. Make sure that both sides of any steps have handrails.  Any raised decks and porches should have guardrails on the edges.  Have any leaves, snow, or ice cleared regularly.  Use sand or salt on walking paths during winter.  Clean up any spills in your garage right away. This includes oil or grease spills. What can I do in the bathroom?  Use night lights.  Install grab bars by the toilet and in the tub and shower. Do not use towel bars as grab bars.  Use non-skid mats or decals in the tub or shower.  If you need to sit down in the shower, use a plastic, non-slip stool.  Keep the floor dry. Clean up any water that spills on the floor as soon as it happens.  Remove  soap buildup in the tub or shower regularly.  Attach bath mats securely with double-sided non-slip rug tape.  Do not have throw rugs and other things on the floor that can make you trip. What can I do in the bedroom?  Use night lights.  Make sure that you have a light by your bed that is easy to reach.  Do not use any sheets or blankets that are too big for your bed. They should not hang down onto the floor.  Have a firm chair that has side arms. You can use this for support while you get dressed.  Do not have throw rugs and other things on the floor that can make you trip. What can I do in the kitchen?  Clean up any spills right away.  Avoid walking on wet floors.  Keep items that you use a lot in easy-to-reach places.  If you need to reach something above you, use a strong step stool that has a grab bar.  Keep electrical cords out of the way.  Do not use floor polish or wax that makes floors slippery. If you must use wax, use non-skid floor wax.  Do not have throw rugs and other things on the floor that  can make you trip. What can I do with my stairs?  Do not leave any items on the stairs.  Make sure that there are handrails on both sides of the stairs and use them. Fix handrails that are broken or loose. Make sure that handrails are as long as the stairways.  Check any carpeting to make sure that it is firmly attached to the stairs. Fix any carpet that is loose or worn.  Avoid having throw rugs at the top or bottom of the stairs. If you do have throw rugs, attach them to the floor with carpet tape.  Make sure that you have a light switch at the top of the stairs and the bottom of the stairs. If you do not have them, ask someone to add them for you. What else can I do to help prevent falls?  Wear shoes that:  Do not have high heels.  Have rubber bottoms.  Are comfortable and fit you well.  Are closed at the toe. Do not wear sandals.  If you use a  stepladder:  Make sure that it is fully opened. Do not climb a closed stepladder.  Make sure that both sides of the stepladder are locked into place.  Ask someone to hold it for you, if possible.  Clearly mark and make sure that you can see:  Any grab bars or handrails.  First and last steps.  Where the edge of each step is.  Use tools that help you move around (mobility aids) if they are needed. These include:  Canes.  Walkers.  Scooters.  Crutches.  Turn on the lights when you go into a dark area. Replace any light bulbs as soon as they burn out.  Set up your furniture so you have a clear path. Avoid moving your furniture around.  If any of your floors are uneven, fix them.  If there are any pets around you, be aware of where they are.  Review your medicines with your doctor. Some medicines can make you feel dizzy. This can increase your chance of falling. Ask your doctor what other things that you can do to help prevent falls. This information is not intended to replace advice given to you by your health care provider. Make sure you discuss any questions you have with your health care provider. Document Released: 11/20/2008 Document Revised: 07/02/2015 Document Reviewed: 02/28/2014 Elsevier Interactive Patient Education  2017 [employer].

## 2019-01-21 NOTE — Progress Notes (Signed)
PCP notes:  Health Maintenance: No gaps   Abnormal Screenings: none   Patient concerns: none   Nurse concerns: none   Next PCP appt.: 01/25/2019 @ 8:30 am

## 2019-01-25 ENCOUNTER — Encounter: Payer: Self-pay | Admitting: Family Medicine

## 2019-01-25 ENCOUNTER — Other Ambulatory Visit: Payer: Self-pay

## 2019-01-25 ENCOUNTER — Ambulatory Visit (INDEPENDENT_AMBULATORY_CARE_PROVIDER_SITE_OTHER): Payer: Medicare Other | Admitting: Family Medicine

## 2019-01-25 VITALS — BP 128/80 | HR 75 | Temp 97.4°F | Ht 68.0 in | Wt 258.0 lb

## 2019-01-25 DIAGNOSIS — Z853 Personal history of malignant neoplasm of breast: Secondary | ICD-10-CM

## 2019-01-25 DIAGNOSIS — E7800 Pure hypercholesterolemia, unspecified: Secondary | ICD-10-CM | POA: Diagnosis not present

## 2019-01-25 DIAGNOSIS — G5600 Carpal tunnel syndrome, unspecified upper limb: Secondary | ICD-10-CM | POA: Insufficient documentation

## 2019-01-25 DIAGNOSIS — E559 Vitamin D deficiency, unspecified: Secondary | ICD-10-CM | POA: Diagnosis not present

## 2019-01-25 DIAGNOSIS — E669 Obesity, unspecified: Secondary | ICD-10-CM

## 2019-01-25 DIAGNOSIS — M3500 Sicca syndrome, unspecified: Secondary | ICD-10-CM

## 2019-01-25 DIAGNOSIS — R7303 Prediabetes: Secondary | ICD-10-CM

## 2019-01-25 DIAGNOSIS — G5603 Carpal tunnel syndrome, bilateral upper limbs: Secondary | ICD-10-CM

## 2019-01-25 DIAGNOSIS — E538 Deficiency of other specified B group vitamins: Secondary | ICD-10-CM

## 2019-01-25 DIAGNOSIS — Z1211 Encounter for screening for malignant neoplasm of colon: Secondary | ICD-10-CM

## 2019-01-25 MED ORDER — EZETIMIBE 10 MG PO TABS: 10.0000 mg | ORAL_TABLET | Freq: Every day | ORAL | 3 refills | Status: DC

## 2019-01-25 MED ORDER — OXYBUTYNIN 3.9 MG/24HR TD PTTW: 1.0000 | MEDICATED_PATCH | TRANSDERMAL | 3 refills | Status: DC

## 2019-01-25 MED ORDER — FLUOXETINE HCL 20 MG PO CAPS: 20.0000 mg | ORAL_CAPSULE | Freq: Every day | ORAL | 3 refills | Status: DC

## 2019-01-25 MED ORDER — PILOCARPINE HCL 5 MG PO TABS: 5.0000 mg | ORAL_TABLET | Freq: Three times a day (TID) | ORAL | 3 refills | Status: DC

## 2019-01-25 MED ORDER — CELECOXIB 200 MG PO CAPS: 200.0000 mg | ORAL_CAPSULE | Freq: Every day | ORAL | 3 refills | Status: DC

## 2019-01-25 NOTE — Patient Instructions (Addendum)
For cholesterol Avoid red meat/ fried foods/ egg yolks/ fatty breakfast meats/ butter, cheese and high fat dairy/ and shellfish   For diabetes prevention and weight loss  Try to get most of your carbohydrates from produce (with the exception of white potatoes)  Eat less bread/pasta/rice/snack foods/cereals/sweets and other items from the middle of the grocery store (processed carbs)   Take care of yourself

## 2019-01-25 NOTE — Assessment & Plan Note (Signed)
No re occurrence  bilat mastectomy  No axillary lumps today

## 2019-01-25 NOTE — Assessment & Plan Note (Signed)
No clinical changes Continues salagen

## 2019-01-25 NOTE — Assessment & Plan Note (Signed)
Discussed how this problem influences overall health and the risks it imposes  Reviewed plan for weight loss with lower calorie diet (via better food choices and also portion control or program like weight watchers) and exercise building up to or more than 30 minutes 5 days per week including some aerobic activity    

## 2019-01-25 NOTE — Assessment & Plan Note (Signed)
Getting injections which have helped No plans for surgery

## 2019-01-25 NOTE — Assessment & Plan Note (Signed)
Lab Results  Component Value Date   [id number] 01/21/2019   mt on oral supplement only now

## 2019-01-25 NOTE — Assessment & Plan Note (Signed)
Vitamin D level is therapeutic with current supplementation Disc importance of this to bone and overall health Level of 47.6

## 2019-01-25 NOTE — Assessment & Plan Note (Signed)
cologuard neg 2018

## 2019-01-25 NOTE — Assessment & Plan Note (Signed)
On zetia  LDL is up  Disc goals for lipids and reasons to control them Rev last labs with pt Rev low sat fat diet in detail  Worse diet so reviewed what to work on

## 2019-01-25 NOTE — Assessment & Plan Note (Signed)
Lab Results  Component Value Date   HGBA1C 6.1 01/21/2019   Stable disc imp of low glycemic diet and wt loss to prevent DM2

## 2019-01-25 NOTE — Progress Notes (Signed)
Subjective:    Patient ID: Lauren [name], female    DOB: Jun 01, 1945, 73 y.o.   MRN: [id number]   This visit occurred during the SARS-CoV-2 public health emergency.  Safety protocols were in place, including screening questions prior to the visit, additional usage of staff PPE, and extensive cleaning of exam room while observing appropriate contact time as indicated for disinfecting solutions.    HPI Here for annual f/u of chronic medical problems   Feeling well  Staying safe during the pandemic  Wt Readings from Last 3 Encounters:  01/25/19 258 lb (117 kg)  01/21/19 255 lb (115.7 kg)  02/15/18 240 lb 9.6 oz (109.1 kg)  not good overall diet and exercise  39.23 kg/m   Since the pandemic -poor motivation  Knows she needs to stay off the sugar   Has a paved driveway to walk  Also cares for chickens/gardening  In the summer -swam   Had amw on 12/14 No gaps or concerns  Was diagnosed with carpal tunnel - getting injections  Helped with pain    Mammogram-not needed/double mastectomy  cologuard 1/18 -negative   dexa 11/19 -normal  Falls-none  fx-none  Supplements - takes vit D  Exercise - a little walking in driveway  BP Readings from Last 3 Encounters:  01/25/19 128/80  02/15/18 128/80  01/15/18 122/80   Pulse Readings from Last 3 Encounters:  01/25/19 75  02/15/18 61  01/15/18 60     Hyperlipidemia  Lab Results  Component Value Date   CHOL 185 01/21/2019   CHOL 163 01/11/2018   CHOL 175 01/04/2017   Lab Results  Component Value Date   HDL 48.80 01/21/2019   HDL 49.50 01/11/2018   HDL 49.40 01/04/2017   Lab Results  Component Value Date   LDLCALC 116 (H) 01/21/2019   LDLCALC 99 01/11/2018   LDLCALC 110 (H) 01/04/2017   Lab Results  Component Value Date   TRIG 98.0 01/21/2019   TRIG 73.0 01/11/2018   TRIG 78.0 01/04/2017   Lab Results  Component Value Date   CHOLHDL 4 01/21/2019   CHOLHDL 3 01/11/2018   CHOLHDL 4 01/04/2017   Lab  Results  Component Value Date   LDLDIRECT 148.9 08/20/2012   LDLDIRECT 184.7 02/11/2009  eating fatty foods- LDL is up  Thinks she can change that  Eats eggs  More cheese than she needs to  Some red meat- mostly [place] and chicken  Does not eat fried food  Lots of bacon /sausage  Prediabetes Lab Results  Component Value Date   HGBA1C 6.1 01/21/2019   No change from last time    Vit B12 Lab Results  Component Value Date   VITAMINB12 387 01/21/2019   Staying in the normal range Not taking injections anymore  Taking oral B12-not sure how much   sjogren's disease Stable blinically Still has to treat dry mouth with Salagen  Patient Active Problem List   Diagnosis Date Noted  . Carpal tunnel syndrome 01/25/2019  . Rash and nonspecific skin eruption 07/20/2018  . Vitamin B12 deficiency 03/27/2018  . Sjogren's disease ([place]) 01/15/2018  . Osteoarthritis 01/15/2018  . Obesity (BMI 30-39.9) 01/15/2018  . H/O herpes zoster 11/22/2017  . Estrogen deficiency 01/09/2017  . Routine general medical examination at a health care facility 01/01/2017  . OSA (obstructive sleep apnea) 09/18/2016  . Somnolence, daytime 09/18/2016  . Colon cancer screening 12/24/2014  . Bee sting allergy 06/10/2014  . Encounter for Medicare annual wellness exam 12/03/2013  .  History of shingles 11/27/2013  . Hx of breast cancer 08/22/2011  . Gynecological examination 02/14/2011  . Bilateral dry eyes 01/20/2011  . Vitamin D deficiency 04/22/2008  . Hyperlipidemia 04/22/2008  . GERD 04/22/2008  . Prediabetes 04/22/2008   Past Medical History:  Diagnosis Date  . Breast cancer ([place])    DCIS; BRCA 1 pos HER2 pos, ER2 pos  . DDD (degenerative disc disease)   . GERD (gastroesophageal reflux disease)   . HLD (hyperlipidemia)    statin intolerant  . Hyperglycemia 9/09   A1C elvated to 6.2  . Osteoarthritis   . Osteopenia 2007   ? from xray with nl dexa  . Reactive depression (situational)     very well controlled, and now takes prozac for sleep  . Sjogren's syndrome ([place])   . Urine incontinence   . Vitamin D deficiency    Past Surgical History:  Procedure Laterality Date  . BILATERAL OOPHORECTOMY  205  . COLONOSCOPY  5/07   normal; recheck 3 years  . LUMBAR DISC SURGERY  1991   Ruptured disk L5  . MASTECTOMY  2001   bilateral  . TONSILLECTOMY  1974  . TOTAL HIP ARTHROPLASTY  2011  . TOTAL KNEE ARTHROPLASTY  2006   bilateral   Social History   Tobacco Use  . Smoking status: Never Smoker  . Smokeless tobacco: Never Used  Substance Use Topics  . Alcohol use: Yes    Alcohol/week: 0.0 standard drinks    Comment: occassional wine  . Drug use: No   Family History  Problem Relation Age of Onset  . Arthritis Other        family hx  . Breast cancer Other        1st degree relative <50  . Diabetes Other        1st degree relative  . Hyperlipidemia Other        family hx  . Hypertension Other        family hx  . Ovarian cancer Other        family hx  . Prostate cancer Other        1st degree relative <50  . Stroke Other        family hx  . Dementia Mother   . Psoriasis Brother   . Other Daughter        BRCA gene  . Brain cancer Brother    Allergies  Allergen Reactions  . Bee Venom   . Statins     REACTION: joint pain   Current Outpatient Medications on File Prior to Visit  Medication Sig Dispense Refill  . cholecalciferol (VITAMIN D) 1000 units tablet Take 1,000 Units by mouth daily.    Marland Kitchen EPINEPHrine 0.3 mg/0.3 mL IJ SOAJ injection Inject 0.3 mLs (0.3 mg total) into the muscle once. 1 Device 5  . Fexofenadine HCl (ALLEGRA PO) Take 1 tablet by mouth daily.    . Fish Oil-Cholecalciferol (OMEGA-3 + VITAMIN D3 PO) Take 2 capsules by mouth daily.     Marland Kitchen omeprazole (PRILOSEC) 20 MG capsule Take 20 mg by mouth daily.      Marland Kitchen triamcinolone cream (KENALOG) 0.1 % Apply 1 application topically 2 (two) times daily. To affected rash areas 30 g 0  . valACYclovir  (VALTREX) 500 MG tablet TAKE 1 TABLET BY MOUTH TWICE DAILY AS NEEDED FOR OUTBREAK/RASH 14 tablet 0   No current facility-administered medications on file prior to visit.     Review of Systems  Constitutional: Negative for activity change, appetite change, fatigue, fever and unexpected weight change.  HENT: Negative for congestion, ear pain, rhinorrhea, sinus pressure and sore throat.   Eyes: Negative for pain, redness and visual disturbance.  Respiratory: Negative for cough, shortness of breath and wheezing.   Cardiovascular: Negative for chest pain and palpitations.  Gastrointestinal: Negative for abdominal pain, blood in stool, constipation and diarrhea.  Endocrine: Negative for polydipsia and polyuria.  Genitourinary: Negative for dysuria, frequency and urgency.  Musculoskeletal: Negative for arthralgias, back pain and myalgias.  Skin: Negative for pallor and rash.  Allergic/Immunologic: Negative for environmental allergies.  Neurological: Negative for dizziness, syncope and headaches.       Carpal tunnel both hands with tingling   Hematological: Negative for adenopathy. Does not bruise/bleed easily.  Psychiatric/Behavioral: Negative for decreased concentration and dysphoric mood. The patient is not nervous/anxious.        Objective:   Physical Exam Constitutional:      General: She is not in acute distress.    Appearance: Normal appearance. She is well-developed. She is obese. She is not ill-appearing or diaphoretic.  HENT:     Head: Normocephalic and atraumatic.     Right Ear: Tympanic membrane, ear canal and external ear normal.     Left Ear: Tympanic membrane, ear canal and external ear normal.     Nose: Nose normal. No congestion.     Mouth/Throat:     Mouth: Mucous membranes are moist.     Pharynx: Oropharynx is clear. No posterior oropharyngeal erythema.  Eyes:     General: No scleral icterus.    Extraocular Movements: Extraocular movements intact.      Conjunctiva/sclera: Conjunctivae normal.     Pupils: Pupils are equal, round, and reactive to light.  Neck:     Thyroid: No thyromegaly.     Vascular: No carotid bruit or JVD.  Cardiovascular:     Rate and Rhythm: Normal rate and regular rhythm.     Pulses: Normal pulses.     Heart sounds: Normal heart sounds. No gallop.   Pulmonary:     Effort: Pulmonary effort is normal. No respiratory distress.     Breath sounds: Normal breath sounds. No wheezing.     Comments: Good air exch Chest:     Chest wall: No tenderness.  Abdominal:     General: Bowel sounds are normal. There is no distension or abdominal bruit.     Palpations: Abdomen is soft. There is no mass.     Tenderness: There is no abdominal tenderness.     Hernia: No hernia is present.  Genitourinary:    Comments: Mastectomy sites are free of M, tenderness or skin change Nl axillary exam  Musculoskeletal:        General: No tenderness. Normal range of motion.     Cervical back: Normal range of motion and neck supple. No rigidity. No muscular tenderness.     Right lower leg: No edema.     Left lower leg: No edema.  Lymphadenopathy:     Cervical: No cervical adenopathy.  Skin:    General: Skin is warm and dry.     Coloration: Skin is not pale.     Findings: No erythema or rash.  Neurological:     Mental Status: She is alert. Mental status is at baseline.     Cranial Nerves: No cranial nerve deficit.     Motor: No abnormal muscle tone.     Coordination: Coordination normal.  Gait: Gait normal.     Deep Tendon Reflexes: Reflexes are normal and symmetric. Reflexes normal.  Psychiatric:        Mood and Affect: Mood normal.        Cognition and Memory: Cognition and memory normal.           Assessment & Plan:   Problem List Items Addressed This Visit      Nervous and Auditory   Carpal tunnel syndrome    Getting injections which have helped No plans for surgery        Relevant Medications   FLUoxetine  (PROZAC) 20 MG capsule     Other   Vitamin D deficiency    Vitamin D level is therapeutic with current supplementation Disc importance of this to bone and overall health Level of 47.6      Hyperlipidemia - Primary    On zetia  LDL is up  Disc goals for lipids and reasons to control them Rev last labs with pt Rev low sat fat diet in detail  Worse diet so reviewed what to work on       Relevant Medications   ezetimibe (ZETIA) 10 MG tablet   Prediabetes    Lab Results  Component Value Date   HGBA1C 6.1 01/21/2019   Stable disc imp of low glycemic diet and wt loss to prevent DM2       Hx of breast cancer    No re occurrence  bilat mastectomy  No axillary lumps today      Colon cancer screening    cologuard neg 2018      Sjogren's disease ([place])    No clinical changes Continues salagen      Obesity (BMI 30-39.9)    Discussed how this problem influences overall health and the risks it imposes  Reviewed plan for weight loss with lower calorie diet (via better food choices and also portion control or program like weight watchers) and exercise building up to or more than 30 minutes 5 days per week including some aerobic activity         Vitamin B12 deficiency    Lab Results  Component Value Date   VITAMINB12 387 01/21/2019   mt on oral supplement only now

## 2019-02-11 DIAGNOSIS — Z7189 Other specified counseling: Secondary | ICD-10-CM | POA: Diagnosis not present

## 2019-02-11 DIAGNOSIS — G5603 Carpal tunnel syndrome, bilateral upper limbs: Secondary | ICD-10-CM | POA: Diagnosis not present

## 2019-02-18 DIAGNOSIS — G5603 Carpal tunnel syndrome, bilateral upper limbs: Secondary | ICD-10-CM | POA: Diagnosis not present

## 2019-02-19 ENCOUNTER — Telehealth: Payer: Self-pay | Admitting: Pulmonary Disease

## 2019-02-19 NOTE — Telephone Encounter (Signed)
Pt is aware that cpap download has been received and faxed to Dr. Halford Chessman for review.   Will route to Dr. Halford Chessman as an Juluis Rainier.

## 2019-02-20 ENCOUNTER — Telehealth: Payer: Self-pay | Admitting: Pulmonary Disease

## 2019-02-20 NOTE — Telephone Encounter (Signed)
Please see 02/20/2019 phone note.

## 2019-02-20 NOTE — Telephone Encounter (Signed)
Pt is aware of results and voiced her understanding. Nothing further is needed.  

## 2019-02-20 NOTE — Telephone Encounter (Signed)
CPAP 01/13/19 to 02/11/19 >> used on 29 of 29 nights with average 9 hrs 13 min.  Average AHI 0.67.    Please let her know CPAP report shows very good control with current settings.

## 2019-02-20 NOTE — Telephone Encounter (Signed)
CPAP 01/13/19 to 02/11/19 >> used on 29 of 29 nights with average 9 hrs 13 min.  Average AHI 0.67.   Please let her know her download shows very good control of sleep apnea with current settings.

## 2019-02-21 ENCOUNTER — Ambulatory Visit (INDEPENDENT_AMBULATORY_CARE_PROVIDER_SITE_OTHER): Payer: Medicare Other | Admitting: Pulmonary Disease

## 2019-02-21 ENCOUNTER — Encounter: Payer: Self-pay | Admitting: Pulmonary Disease

## 2019-02-21 DIAGNOSIS — G4733 Obstructive sleep apnea (adult) (pediatric): Secondary | ICD-10-CM | POA: Diagnosis not present

## 2019-02-21 NOTE — Progress Notes (Signed)
[facility]  Chief Complaint  Patient presents with  . Follow-up    wearing cpap avg 7-11hr nightly- feels pressure and mask are okay.     Constitutional:  There were no vitals taken for this visit.  Deferred.  Past Medical History:  Breast cancer, DDD, GERD, HLD, OA, Osteopenia, Depression, Sjogren's syndrome, Vit D deficiency, Carpal tunnel syndrome  Brief Summary:  Lauren [name] is a 74 y.o. female with obstructive sleep apnea.  Virtual Visit via Telephone Note  I connected with Lauren [name] on 02/21/19 at  9:00 AM EST by telephone and verified that I am speaking with the correct person using two identifiers.  Location: Patient: home Provider: medical office   I discussed the limitations, risks, security and privacy concerns of performing an evaluation and management service by telephone and the availability of in person appointments. I also discussed with the patient that there may be a patient responsible charge related to this service. The patient expressed understanding and agreed to proceed.  Previously seen by Dr. Ashby Dawes.  Sleep study from 2018 showed severe OSA.  Uses CPAP nightly.  No issues with mask fit.  Not having sinus congestion, sore throat, aerophagia.  Has chronic dry mouth from Sjogren's.  Uses bathroom 1 time per night.  No sleep aide medications.  Doesn't take naps.  Has travel machine >> uses when she visits her daughter in [place] and daughter in [place].   Physical Exam:   Deferred.   Assessment/Plan:   Obstructive sleep apnea. - she is compliant with CPAP and reports benefit - continue auto CPAP range 13 to 16 cm H2O  Sjogren's syndrome. - followed by Dr. Marlowe Sax with rheumatology  Patient Instructions  Follow up in 1 year     I discussed the assessment and treatment plan with the patient. The patient was provided an opportunity to ask questions and all were answered.  The patient agreed with the plan and demonstrated an understanding of the instructions.   The patient was advised to call back or seek an in-person evaluation if the symptoms worsen or if the condition fails to improve as anticipated.  I provided 13 minutes of non-face-to-face time during this encounter.  Chesley Mires, MD [facility] Pager: [phone] 02/21/2019, 9:16 AM  Flow Sheet    Sleep tests:  PSG 10/19/16 >> AHI 76, SpO2 low 84.5% CPAP 01/13/19 to 02/11/19 >> used on 29 of 29 nights with average 9 hrs 13 min.  Average AHI 0.67.  Medications:   Allergies as of 02/21/2019      Reactions   Bee Venom    Statins    REACTION: joint pain      Medication List       Accurate as of February 21, 2019  9:16 AM. If you have any questions, ask your nurse or doctor.        ALLEGRA PO Take 1 tablet by mouth daily.   celecoxib 200 MG capsule Commonly known as: CELEBREX Take 1 capsule (200 mg total) by mouth daily. With food   cholecalciferol 1000 units tablet Commonly known as: VITAMIN D Take 1,000 Units by mouth daily.   EPINEPHrine 0.3 mg/0.3 mL Soaj injection Commonly known as: EPI-PEN Inject 0.3 mLs (0.3 mg total) into the muscle once.   ezetimibe 10 MG tablet Commonly known as: ZETIA Take 1 tablet (10 mg total) by mouth daily.   FLUoxetine 20 MG capsule Commonly known as: PROZAC Take 1 capsule (20  mg total) by mouth daily.   OMEGA-3 + VITAMIN D3 PO Take 2 capsules by mouth daily.   omeprazole 20 MG capsule Commonly known as: PRILOSEC Take 20 mg by mouth daily.   oxybutynin 3.9 MG/24HR Commonly known as: OXYTROL Place 1 patch onto the skin 2 (two) times a week.   pilocarpine 5 MG tablet Commonly known as: SALAGEN Take 1 tablet (5 mg total) by mouth 3 (three) times daily.   triamcinolone cream 0.1 % Commonly known as: KENALOG Apply 1 application topically 2 (two) times daily. To affected rash areas   valACYclovir 500 MG  tablet Commonly known as: VALTREX TAKE 1 TABLET BY MOUTH TWICE DAILY AS NEEDED FOR OUTBREAK/RASH       Past Surgical History:  She  has a past surgical history that includes Tonsillectomy (1974); Lumbar disc surgery (1991); Mastectomy (2001); Bilateral oophorectomy (205); Total knee arthroplasty (2006); Colonoscopy (5/07); and Total hip arthroplasty (2011).  Family History:  Her family history includes Arthritis in an other family member; Brain cancer in her brother; Breast cancer in an other family member; Dementia in her mother; Diabetes in an other family member; Hyperlipidemia in an other family member; Hypertension in an other family member; Other in her daughter; Ovarian cancer in an other family member; Prostate cancer in an other family member; Psoriasis in her brother; Stroke in an other family member.  Social History:  She  reports that she has never smoked. She has never used smokeless tobacco. She reports current alcohol use. She reports that she does not use drugs.

## 2019-02-21 NOTE — Patient Instructions (Signed)
Follow up in 1 year.

## 2019-03-11 DIAGNOSIS — G5603 Carpal tunnel syndrome, bilateral upper limbs: Secondary | ICD-10-CM | POA: Diagnosis not present

## 2019-03-11 DIAGNOSIS — M159 Polyosteoarthritis, unspecified: Secondary | ICD-10-CM | POA: Diagnosis not present

## 2019-03-11 DIAGNOSIS — M3509 Sicca syndrome with other organ involvement: Secondary | ICD-10-CM | POA: Diagnosis not present

## 2019-04-10 DIAGNOSIS — H25813 Combined forms of age-related cataract, bilateral: Secondary | ICD-10-CM | POA: Diagnosis not present

## 2019-04-10 DIAGNOSIS — D3131 Benign neoplasm of right choroid: Secondary | ICD-10-CM | POA: Diagnosis not present

## 2019-04-17 ENCOUNTER — Encounter (INDEPENDENT_AMBULATORY_CARE_PROVIDER_SITE_OTHER): Payer: Medicare Other | Admitting: Ophthalmology

## 2019-04-25 DIAGNOSIS — H02403 Unspecified ptosis of bilateral eyelids: Secondary | ICD-10-CM | POA: Diagnosis not present

## 2019-06-06 ENCOUNTER — Telehealth: Payer: Self-pay | Admitting: Pulmonary Disease

## 2019-06-06 NOTE — Telephone Encounter (Signed)
Called and spoke with pt who stated cpap supplier had to have an rx sent to the cpap supplier and said that this was supposed to have been faxed to the office to have it taken care of.  [place], please advise if you have seen this come through on pt.

## 2019-06-07 NOTE — Telephone Encounter (Signed)
I have not seen any forms. I will keep this open to look out for the form.

## 2019-06-12 DIAGNOSIS — G5603 Carpal tunnel syndrome, bilateral upper limbs: Secondary | ICD-10-CM | POA: Diagnosis not present

## 2019-06-19 NOTE — Telephone Encounter (Signed)
Still do not see a form here  I called the pt to see if maybe she had the phone number to DME so I could call and get the number to call them  She states not necc as she has already received her CPAP supplies  Nothing further needed

## 2019-06-26 DIAGNOSIS — G5603 Carpal tunnel syndrome, bilateral upper limbs: Secondary | ICD-10-CM | POA: Diagnosis not present

## 2019-07-15 DIAGNOSIS — H02831 Dermatochalasis of right upper eyelid: Secondary | ICD-10-CM | POA: Insufficient documentation

## 2019-07-15 DIAGNOSIS — H02834 Dermatochalasis of left upper eyelid: Secondary | ICD-10-CM | POA: Diagnosis not present

## 2019-07-15 DIAGNOSIS — H02403 Unspecified ptosis of bilateral eyelids: Secondary | ICD-10-CM | POA: Diagnosis not present

## 2019-07-15 DIAGNOSIS — H5340 Unspecified visual field defects: Secondary | ICD-10-CM | POA: Diagnosis not present

## 2019-08-08 ENCOUNTER — Ambulatory Visit (INDEPENDENT_AMBULATORY_CARE_PROVIDER_SITE_OTHER): Payer: Medicare Other | Admitting: Family Medicine

## 2019-08-08 ENCOUNTER — Encounter: Payer: Self-pay | Admitting: Family Medicine

## 2019-08-08 ENCOUNTER — Other Ambulatory Visit: Payer: Self-pay

## 2019-08-08 VITALS — BP 142/82 | HR 62 | Temp 96.2°F | Ht 68.0 in | Wt 249.8 lb

## 2019-08-08 DIAGNOSIS — R21 Rash and other nonspecific skin eruption: Secondary | ICD-10-CM | POA: Diagnosis not present

## 2019-08-08 DIAGNOSIS — M3500 Sicca syndrome, unspecified: Secondary | ICD-10-CM

## 2019-08-08 DIAGNOSIS — L989 Disorder of the skin and subcutaneous tissue, unspecified: Secondary | ICD-10-CM

## 2019-08-08 MED ORDER — TRIAMCINOLONE ACETONIDE 0.1 % EX CREA: 1.0000 "application " | TOPICAL_CREAM | Freq: Three times a day (TID) | CUTANEOUS | 0 refills | Status: DC

## 2019-08-08 MED ORDER — VALACYCLOVIR HCL 500 MG PO TABS: ORAL_TABLET | ORAL | 3 refills | Status: DC

## 2019-08-08 NOTE — Progress Notes (Signed)
Subjective:    Patient ID: Lauren [name], female    DOB: 13-Mar-1945, 74 y.o.   MRN: [id number]  This visit occurred during the SARS-CoV-2 public health emergency.  Safety protocols were in place, including screening questions prior to the visit, additional usage of staff PPE, and extensive cleaning of exam room while observing appropriate contact time as indicated for disinfecting solutions.    HPI Pt presents with skin lesion on forehead on hair line  (noted fri night)  Brown with black dot in middle (picked at it thinking it was a tick)   No tick bite noted  Itches and burns  Some itchy bumps on hands and back also -also back of legs In between fingers She scratches them   She is outdoors a lot   Thinks she had shingles /or hsv 6 wk ago- L arm and axilla Used valtrex and it got better   No new lotions or products   No sunburns     Wt Readings from Last 3 Encounters:  08/08/19 249 lb 12.8 oz (113.3 kg)  01/25/19 258 lb (117 kg)  01/21/19 255 lb (115.7 kg)   37.98 kg/m  Patient Active Problem List   Diagnosis Date Noted  . Skin lesion 08/08/2019  . Carpal tunnel syndrome 01/25/2019  . Rash and nonspecific skin eruption 07/20/2018  . Vitamin B12 deficiency 03/27/2018  . Sjogren's disease ([place]) 01/15/2018  . Osteoarthritis 01/15/2018  . Obesity (BMI 30-39.9) 01/15/2018  . H/O herpes zoster 11/22/2017  . Estrogen deficiency 01/09/2017  . Routine general medical examination at a health care facility 01/01/2017  . OSA (obstructive sleep apnea) 09/18/2016  . Somnolence, daytime 09/18/2016  . Colon cancer screening 12/24/2014  . Bee sting allergy 06/10/2014  . Encounter for Medicare annual wellness exam 12/03/2013  . History of shingles 11/27/2013  . Hx of breast cancer 08/22/2011  . Gynecological examination 02/14/2011  . Bilateral dry eyes 01/20/2011  . Vitamin D deficiency 04/22/2008  . Hyperlipidemia 04/22/2008  . GERD 04/22/2008  . Prediabetes 04/22/2008    Past Medical History:  Diagnosis Date  . Breast cancer ([place])    DCIS; BRCA 1 pos HER2 pos, ER2 pos  . DDD (degenerative disc disease)   . GERD (gastroesophageal reflux disease)   . HLD (hyperlipidemia)    statin intolerant  . Hyperglycemia 9/09   A1C elvated to 6.2  . Osteoarthritis   . Osteopenia 2007   ? from xray with nl dexa  . Reactive depression (situ[place])    very well controlled, and now takes prozac for sleep  . Sjogren's syndrome ([place])   . Urine incontinence   . Vitamin D deficiency    Past Surgical History:  Procedure Laterality Date  . BILATERAL OOPHORECTOMY  205  . COLONOSCOPY  5/07   normal; recheck 3 years  . LUMBAR DISC SURGERY  1991   Ruptured disk L5  . MASTECTOMY  2001   bilateral  . TONSILLECTOMY  1974  . TOTAL HIP ARTHROPLASTY  2011  . TOTAL KNEE ARTHROPLASTY  2006   bilateral   Social History   Tobacco Use  . Smoking status: Never Smoker  . Smokeless tobacco: Never Used  Vaping Use  . Vaping Use: Never used  Substance Use Topics  . Alcohol use: Yes    Alcohol/week: 0.0 standard drinks    Comment: occassional wine  . Drug use: No   Family History  Problem Relation Age of Onset  . Arthritis Other  family hx  . Breast cancer Other        1st degree relative <50  . Diabetes Other        1st degree relative  . Hyperlipidemia Other        family hx  . Hypertension Other        family hx  . Ovarian cancer Other        family hx  . Prostate cancer Other        1st degree relative <50  . Stroke Other        family hx  . Dementia Mother   . Psoriasis Brother   . Other Daughter        BRCA gene  . Brain cancer Brother    Allergies  Allergen Reactions  . Bee Venom   . Statins     REACTION: joint pain   Current Outpatient Medications on File Prior to Visit  Medication Sig Dispense Refill  . celecoxib (CELEBREX) 200 MG capsule Take 1 capsule (200 mg total) by mouth daily. With food 90 capsule 3  . cholecalciferol  (VITAMIN D) 1000 units tablet Take 1,000 Units by mouth daily.    Marland Kitchen EPINEPHrine 0.3 mg/0.3 mL IJ SOAJ injection Inject 0.3 mLs (0.3 mg total) into the muscle once. 1 Device 5  . ezetimibe (ZETIA) 10 MG tablet Take 1 tablet (10 mg total) by mouth daily. 90 tablet 3  . Fexofenadine HCl (ALLEGRA PO) Take 1 tablet by mouth daily.    . Fish Oil-Cholecalciferol (OMEGA-3 + VITAMIN D3 PO) Take 2 capsules by mouth daily.     Marland Kitchen FLUoxetine (PROZAC) 20 MG capsule Take 1 capsule (20 mg total) by mouth daily. 90 capsule 3  . omeprazole (PRILOSEC) 20 MG capsule Take 20 mg by mouth daily.      Marland Kitchen oxybutynin (OXYTROL) 3.9 MG/24HR Place 1 patch onto the skin 2 (two) times a week. 24 patch 3  . pilocarpine (SALAGEN) 5 MG tablet Take 1 tablet (5 mg total) by mouth 3 (three) times daily. 270 tablet 3   No current facility-administered medications on file prior to visit.    Review of Systems  Constitutional: Negative for activity change, appetite change, fatigue, fever and unexpected weight change.  HENT: Negative for congestion, ear pain, rhinorrhea, sinus pressure and sore throat.        Dry mouth  Eyes: Negative for pain, redness and visual disturbance.  Respiratory: Negative for cough, shortness of breath and wheezing.   Cardiovascular: Negative for chest pain and palpitations.  Gastrointestinal: Negative for abdominal pain, blood in stool, constipation and diarrhea.  Endocrine: Negative for polydipsia and polyuria.  Genitourinary: Negative for dysuria, frequency and urgency.  Musculoskeletal: Negative for arthralgias, back pain and myalgias.  Skin: Positive for rash. Negative for pallor and wound.  Allergic/Immunologic: Negative for environmental allergies.  Neurological: Negative for dizziness, syncope and headaches.  Hematological: Negative for adenopathy. Does not bruise/bleed easily.  Psychiatric/Behavioral: Negative for decreased concentration and dysphoric mood. The patient is not nervous/anxious.         Objective:   Physical Exam Constitutional:      General: She is not in acute distress.    Appearance: Normal appearance. She is obese. She is not ill-appearing.  HENT:     Mouth/Throat:     Mouth: Mucous membranes are moist.  Eyes:     General: No scleral icterus.       Right eye: No discharge.  Left eye: No discharge.     Conjunctiva/sclera: Conjunctivae normal.     Pupils: Pupils are equal, round, and reactive to light.  Cardiovascular:     Rate and Rhythm: Normal rate and regular rhythm.     Pulses: Normal pulses.  Pulmonary:     Effort: Pulmonary effort is normal. No respiratory distress.     Breath sounds: Normal breath sounds. No wheezing.  Musculoskeletal:     Cervical back: Normal range of motion and neck supple.  Lymphadenopathy:     Cervical: No cervical adenopathy.  Skin:    General: Skin is warm and dry.     Findings: Lesion and rash present.     Comments: 1 cm tan lesion on R forehead at hairline- raised/dry/consistency of a tough scab Small darker center - unable to unroof anything  No drainage or redness  Scattered erythematous papules on back and arms and upper abdomen (different sizes-not vesicular)  Small vesicle in between 2,3rd fingers of R hand (resembles eczema)    Neurological:     Mental Status: She is alert.     Coordination: Coordination normal.     Deep Tendon Reflexes: Reflexes normal.           Assessment & Plan:   Problem List Items Addressed This Visit      Musculoskeletal and Integument   Rash and nonspecific skin eruption    Erythematous papules scattered on abdomen/back and arms  (not whelps and non blanching) One vesicle in between fingers  Resemble insect bites and /or plant dermatitis pattern Px triamcinolone cream to use tid prn  Update if not starting to improve in a week or if worsening   Counseled on use of insect spray and proper clothing       Skin lesion - Primary    R forehead at hairline-  resembles seb keratosis in color and shape but consistency is more firm/tough (and unable to unroof the dark area in the center)  Ref made to dermatology for further eval and tx      Relevant Orders   Ambulatory referral to Dermatology     Other   Sjogren's disease ([place])    No clinical changes

## 2019-08-08 NOTE — Assessment & Plan Note (Signed)
No clinical changes 

## 2019-08-08 NOTE — Assessment & Plan Note (Signed)
Erythematous papules scattered on abdomen/back and arms  (not whelps and non blanching) One vesicle in between fingers  Resemble insect bites and /or plant dermatitis pattern Px triamcinolone cream to use tid prn  Update if not starting to improve in a week or if worsening   Counseled on use of insect spray and proper clothing

## 2019-08-08 NOTE — Patient Instructions (Addendum)
We will call you to set up the dermatology referral   Keep forehead lesion clean and dry with soap and water   For red spots (? Insect bites vs plant dermatitis) try the triamcinolone cream  Keep moisturizing hands as well   Update if not starting to improve in a week or if worsening

## 2019-08-08 NOTE — Assessment & Plan Note (Signed)
R forehead at hairline- resembles seb keratosis in color and shape but consistency is more firm/tough (and unable to unroof the dark area in the center)  Ref made to dermatology for further eval and tx

## 2019-09-10 ENCOUNTER — Telehealth: Payer: Self-pay

## 2019-09-10 NOTE — Telephone Encounter (Signed)
I tried calling pt and line is busy; per triage pt should go to UC within 4 hours. FYI to [place] CMA, Orrie Schubert LPN and Dr Glori Bickers.

## 2019-09-10 NOTE — Telephone Encounter (Signed)
I spoke with pt and she is going to [facility] in [place].

## 2019-09-10 NOTE — Telephone Encounter (Signed)
I recommend urgent care eval, thanks

## 2019-09-10 NOTE — Telephone Encounter (Signed)
Sallis [name] - Client TELEPHONE ADVICE RECORD [facility] Patient Name: Lauren [name] Gender: Female DOB: 12-22-1945 Age: 74 Y 58 M 25 D Return Phone Number: [phone] (Primary), [phone] (Secondary) Address: [place]/State/Zip: [place] [address] Client Lauren [name] - Client Client Site [place] MD Contact Type Call Who Is Calling Patient / Member / Family / Caregiver Call Type Triage / Clinical Relationship To Patient Self Return Phone Number [phone] (Primary) Chief Complaint Diarrhea Reason for Call Symptomatic / Request for Health Information Initial Comment The pt has a lack of appetite and she has had diarrhea for the last week. There are no appointments and tomorrow. Translation No Nurse Assessment Nurse: Raphael Gibney, RN, Vanita Ingles Date/Time (Eastern Time): 09/10/2019 9:42:32 AM Confirm and document reason for call. If symptomatic, describe symptoms. ---[name] has had diarrhea for the past week. she has been incontinent of stool. She is bloated. she is going constantly. Having numerous number of stools. Having abd pain. has a lot of mucus with caramel consistency. has blood when she wipes. has urinated. Has the patient had close contact with a person known or suspected to have the novel coronavirus illness OR traveled / lives in area with major community spread (including international travel) in the last 14 days from the onset of symptoms? * If Asymptomatic, screen for exposure and travel within the last 14 days. ---No Does the patient have any new or worsening symptoms? ---Yes Will a triage be completed? ---Yes Related visit to physician within the last 2 weeks? ---No Does the PT have any chronic conditions? (i.e. diabetes, asthma, this includes High risk factors for pregnancy, etc.) ---Yes List chronic conditions.  ---arthritis Is this a behavioral health or substance abuse call? ---No Guidelines Guideline Title Affirmed Question Affirmed Notes Nurse Date/Time (Eastern Time) Diarrhea [1] SEVERE diarrhea (e.g., 7 or more times / [name] more than normal) AND [2] age > 45 years Raphael Gibney, Therapist, sports, Vanita Ingles 09/10/2019 9:49:17 AM PLEASE NOTE: All timestamps contained within this report are represented as [place] Standard Time. CONFIDENTIALTY NOTICE: This fax transmission is intended only for the addressee. It contains information that is legally privileged, confidential or otherwise protected from use or disclosure. If you are not the intended recipient, you are strictly prohibited from reviewing, disclosing, copying using or disseminating any of this information or taking any action in reliance on or regarding this information. If you have received this fax in error, please notify us immediately by telephone so that we can arrange for its return to [place]. Phone: [phone], Toll-Free: [phone], Fax: [phone] Page: 2 of 2 Call Id: [id number] [place]. Time [name] Time) Disposition Final User 09/10/2019 9:56:53 AM See HCP within 4 Hours (or PCP triage) Yes Raphael Gibney, RN, Doreatha Lew Disagree/Comply Disagree Caller Understands Yes PreDisposition Call Doctor Care Advice Given Per Guideline SEE HCP WITHIN 4 HOURS (OR PCP TRIAGE): * IF OFFICE WILL BE OPEN: You need to be seen within the next 3 or 4 hours. Call your doctor (or NP/PA) now or as soon as the office opens. CALL BACK IF: * You become worse. CARE ADVICE given per Diarrhea (Adult) guideline. Comments User: Dannielle Burn, RN Date/Time [name] Time): 09/10/2019 9:56:28 AM Called back line back line spoke to [facility] and gave report that pt has had numerous diarrhea stools with triage outcome of see physician within 4 hrs. Pt would have to have a virtual appt. No appts available. Advised  pt to go to urgent care and she states she can not due to diarrhea and would  like virtual appt when one is available. please call pt back. Referrals GO TO FACILITY UNDECIDED

## 2019-09-11 ENCOUNTER — Encounter: Payer: Self-pay | Admitting: Emergency Medicine

## 2019-09-11 ENCOUNTER — Ambulatory Visit
Admission: EM | Admit: 2019-09-11 | Discharge: 2019-09-11 | Disposition: A | Discharge status: Home / Self Care | Payer: Medicare Other | Attending: Emergency Medicine | Admitting: Emergency Medicine

## 2019-09-11 DIAGNOSIS — R197 Diarrhea, unspecified: Secondary | ICD-10-CM | POA: Diagnosis not present

## 2019-09-11 DIAGNOSIS — K921 Melena: Secondary | ICD-10-CM | POA: Insufficient documentation

## 2019-09-11 LAB — POC HEMOCCULT BLD/STL (OFFICE/1-CARD/DIAGNOSTIC): Fecal Occult Blood, POC: POSITIVE — AB

## 2019-09-11 LAB — POCT URINALYSIS DIP (MANUAL ENTRY)
Bilirubin, UA: NEGATIVE
Glucose, UA: NEGATIVE mg/dL
Ketones, POC UA: NEGATIVE mg/dL
Nitrite, UA: NEGATIVE
Protein Ur, POC: NEGATIVE mg/dL
Spec Grav, UA: 1.02 (ref 1.010–1.025)
Urobilinogen, UA: 0.2 E.U./dL
pH, UA: 5 (ref 5.0–8.0)

## 2019-09-11 LAB — C DIFFICILE QUICK SCREEN W PCR REFLEX
C Diff antigen: NEGATIVE
C Diff interpretation: NOT DETECTED
C Diff toxin: NEGATIVE

## 2019-09-11 MED ORDER — AZITHROMYCIN 500 MG PO TABS
1000.0000 mg | ORAL_TABLET | Freq: Once | ORAL | Status: AC
  Administered 2019-09-11: 1000 mg via ORAL

## 2019-09-11 NOTE — Discharge Instructions (Addendum)
Your were given Zithromax here.   Keep yourself hydrated with clear liquids.  Follow the attached diarrhea diet.  Take Imodium.    Go to the emergency department if you have acute worsening symptoms.    Your stool is positive for blood.  Please call your primary care provider to schedule an appointment to be seen tomorrow.

## 2019-09-11 NOTE — ED Triage Notes (Addendum)
Pt c/o abd pain, loose watery diarrhea every 30 minutes, and rectal bleeding x 8 days. She has not tried any OTC anti diarrheal. She states blood has been light pink and not dark.

## 2019-09-11 NOTE — ED Provider Notes (Signed)
[name]    CSN: [id number] Arrival date & time: 09/11/19  1231      History   Chief Complaint Chief Complaint  Patient presents with  . Diarrhea    HPI Lauren [name] is a 74 y.o. female.   Patient presents with 8-day history of diarrhea.  6 episodes today; each a small amount of thin mucousy stool.  She reports generalized abdominal soreness but no acute abdominal pain.  She also reports light pink blood in her stool.  She denies fever, chills, chest pain, shortness of breath, dysuria, pelvic pain, back pain, rash, or other symptoms.  No treatment attempted at home.  No recent travel or antibiotics.   The history is provided by the patient.    Past Medical History:  Diagnosis Date  . Breast cancer ([place])    DCIS; BRCA 1 pos HER2 pos, ER2 pos  . DDD (degenerative disc disease)   . GERD (gastroesophageal reflux disease)   . HLD (hyperlipidemia)    statin intolerant  . Hyperglycemia 9/09   A1C elvated to 6.2  . Osteoarthritis   . Osteopenia 2007   ? from xray with nl dexa  . Reactive depression ([place])    very well controlled, and now takes prozac for sleep  . Sjogren's syndrome ([place])   . Urine incontinence   . Vitamin D deficiency     Patient Active Problem List   Diagnosis Date Noted  . Skin lesion 08/08/2019  . Carpal tunnel syndrome 01/25/2019  . Rash and nonspecific skin eruption 07/20/2018  . Vitamin B12 deficiency 03/27/2018  . Sjogren's disease ([place]) 01/15/2018  . Osteoarthritis 01/15/2018  . Obesity (BMI 30-39.9) 01/15/2018  . H/O herpes zoster 11/22/2017  . Estrogen deficiency 01/09/2017  . Routine general medical examination at a health care facility 01/01/2017  . OSA (obstructive sleep apnea) 09/18/2016  . Somnolence, daytime 09/18/2016  . Colon cancer screening 12/24/2014  . Bee sting allergy 06/10/2014  . Encounter for Medicare annual wellness exam 12/03/2013  . History of shingles 11/27/2013  . Hx of breast cancer 08/22/2011    . Gynecological examination 02/14/2011  . Bilateral dry eyes 01/20/2011  . Vitamin D deficiency 04/22/2008  . Hyperlipidemia 04/22/2008  . GERD 04/22/2008  . Prediabetes 04/22/2008    Past Surgical History:  Procedure Laterality Date  . BILATERAL OOPHORECTOMY  205  . COLONOSCOPY  5/07   normal; recheck 3 years  . LUMBAR DISC SURGERY  1991   Ruptured disk L5  . MASTECTOMY  2001   bilateral  . TONSILLECTOMY  1974  . TOTAL HIP ARTHROPLASTY  2011  . TOTAL KNEE ARTHROPLASTY  2006   bilateral    OB History   No obstetric history on file.      Home Medications    Prior to Admission medications   Medication Sig Start Date End Date Taking? Authorizing Provider  celecoxib (CELEBREX) 200 MG capsule Take 1 capsule (200 mg total) by mouth daily. With food 01/25/19  Yes Tower, Wynelle Fanny, MD  cholecalciferol (VITAMIN D) 1000 units tablet Take 1,000 Units by mouth daily.   Yes [provider]  EPINEPHrine 0.3 mg/0.3 mL IJ SOAJ injection Inject 0.3 mLs (0.3 mg total) into the muscle once. 06/10/14  Yes Tower, Wynelle Fanny, MD  ezetimibe (ZETIA) 10 MG tablet Take 1 tablet (10 mg total) by mouth daily. 01/25/19  Yes Tower, Wynelle Fanny, MD  Fexofenadine HCl (ALLEGRA PO) Take 1 tablet by mouth daily.   Yes [provider]  Fish Oil-Cholecalciferol (OMEGA-3 + VITAMIN D3 PO) Take 2 capsules by mouth daily.    Yes [provider]  FLUoxetine (PROZAC) 20 MG capsule Take 1 capsule (20 mg total) by mouth daily. 01/25/19  Yes Tower, Wynelle Fanny, MD  omeprazole (PRILOSEC) 20 MG capsule Take 20 mg by mouth daily.     Yes [provider]  oxybutynin (OXYTROL) 3.9 MG/24HR Place 1 patch onto the skin 2 (two) times a week. 01/28/19  Yes Tower, Wynelle Fanny, MD  pilocarpine (SALAGEN) 5 MG tablet Take 1 tablet (5 mg total) by mouth 3 (three) times daily. 01/25/19  Yes Tower, Wynelle Fanny, MD  triamcinolone cream (KENALOG) 0.1 % Apply 1 application topically 3 (three) times daily. To affected rash  areas/ insect bites 08/08/19  Yes Tower, Wynelle Fanny, MD  valACYclovir (VALTREX) 500 MG tablet TAKE 1 TABLET BY MOUTH TWICE DAILY AS NEEDED FOR OUTBREAK/RASH 08/08/19  Yes Tower, Wynelle Fanny, MD    Family History Family History  Problem Relation Age of Onset  . Arthritis Other        family hx  . Breast cancer Other        1st degree relative <50  . Diabetes Other        1st degree relative  . Hyperlipidemia Other        family hx  . Hypertension Other        family hx  . Ovarian cancer Other        family hx  . Prostate cancer Other        1st degree relative <50  . Stroke Other        family hx  . Dementia Mother   . Psoriasis Brother   . Other Daughter        BRCA gene  . Brain cancer Brother     Social History Social History   Tobacco Use  . Smoking status: Never Smoker  . Smokeless tobacco: Never Used  Vaping Use  . Vaping Use: Never used  Substance Use Topics  . Alcohol use: Yes    Alcohol/week: 0.0 standard drinks    Comment: occassional wine  . Drug use: No     Allergies   Bee venom and Statins   Review of Systems Review of Systems  Constitutional: Negative for chills and fever.  HENT: Negative for ear pain and sore throat.   Eyes: Negative for pain and visual disturbance.  Respiratory: Negative for cough and shortness of breath.   Cardiovascular: Negative for chest pain and palpitations.  Gastrointestinal: Positive for abdominal pain, blood in stool and diarrhea. Negative for vomiting.  Genitourinary: Negative for dysuria and hematuria.  Musculoskeletal: Negative for arthralgias and back pain.  Skin: Negative for color change and rash.  Neurological: Negative for seizures and syncope.  All other systems reviewed and are negative.    Physical Exam Triage Vital Signs ED Triage Vitals [09/11/19 1242]  Enc Vitals Group     BP      Pulse      Resp      Temp      Temp src      SpO2      Weight      Height      Head Circumference      Peak Flow       Pain Score 3     Pain Loc      Pain Edu?      Excl. in [place]?  Orthostatic VS for the past 24 hrs:  BP- Lying Pulse- Lying BP- Sitting Pulse- Sitting BP- Standing at 0 minutes Pulse- Standing at 0 minutes  09/11/19 1331 148/81 58 150/82 62 158/84 68    Updated Vital Signs BP 135/83 (BP Location: Right Arm)   Pulse 83   Temp 98.2 F (36.8 C) (Oral)   Resp 16   SpO2 100%   Visual Acuity Right Eye Distance:   Left Eye Distance:   Bilateral Distance:    Right Eye Near:   Left Eye Near:    Bilateral Near:     Physical Exam Vitals and nursing note reviewed.  Constitutional:      General: She is not in acute distress.    Appearance: She is well-developed. She is not ill-appearing.     Comments: Patient is well-appearing, smiling, alert, ambulatory without difficulty.    HENT:     Head: Normocephalic and atraumatic.     Mouth/Throat:     Mouth: Mucous membranes are moist.     Pharynx: Oropharynx is clear.  Eyes:     Conjunctiva/sclera: Conjunctivae normal.  Cardiovascular:     Rate and Rhythm: Normal rate and regular rhythm.     Heart sounds: No murmur heard.   Pulmonary:     Effort: Pulmonary effort is normal. No respiratory distress.     Breath sounds: Normal breath sounds.  Abdominal:     General: Bowel sounds are increased.     Palpations: Abdomen is soft.     Tenderness: There is no abdominal tenderness. There is no right CVA tenderness, left CVA tenderness, guarding or rebound.  Genitourinary:    Rectum: Guaiac result positive.  Musculoskeletal:     Cervical back: Neck supple.     Right lower leg: No edema.     Left lower leg: No edema.  Skin:    General: Skin is warm and dry.     Findings: No rash.  Neurological:     General: No focal deficit present.     Mental Status: She is alert and oriented to person, place, and time.     Gait: Gait normal.  Psychiatric:        Mood and Affect: Mood normal.        Behavior: Behavior normal.      UC  Treatments / Results  Labs (all labs ordered are listed, but only abnormal results are displayed) Labs Reviewed  POCT URINALYSIS DIP (MANUAL ENTRY) - Abnormal; Notable for the following components:      Result Value   Blood, UA trace-intact (*)    Leukocytes, UA Moderate (2+) (*)    All other components within normal limits  POC HEMOCCULT BLD/STL (OFFICE/1-CARD/DIAGNOSTIC) - Abnormal; Notable for the following components:   Fecal Occult Blood, POC Positive (*)    All other components within normal limits  GASTROINTESTINAL PANEL BY PCR, STOOL (REPLACES STOOL CULTURE)  C DIFFICILE QUICK SCREEN W PCR REFLEX  NOVEL CORONAVIRUS, NAA  URINE CULTURE  CBC  COMPREHENSIVE METABOLIC PANEL    EKG   Radiology No results found.  Procedures Procedures (including critical care time)  Medications Ordered in UC Medications  azithromycin (ZITHROMAX) tablet 1,000 mg (has no administration in time range)    Initial Impression / Assessment and Plan / UC Course  I have reviewed the triage vital signs and the nursing notes.  Pertinent labs & imaging results that were available during my care of the patient were reviewed by me and considered in  my medical decision making (see chart for details).   Diarrhea.  Patient adamantly refuses transfer to the ED. patient is well-appearing and her exam is reassuring.  She is not orthostatic.  Treated with 1 gram of Zithromax here.  Stool guaiac positive. Stool specimen sent for testing.  CBC, CMP pending. Urine negative. Patient reports she is able to orally hydrate at home.  Instructed patient to follow a clear liquid diet with advance to diarrhea diet as tolerated.  Instructed her to take OTC Imodium.  Instructed her to go to the emergency department if she has acute worsening symptoms.  Instructed her to call her PCP to schedule a follow-up visit for tomorrow.  Discussed that her stool is positive for blood and that this needs to be followed up as soon as  possible.  Patient agrees to plan of care.     Final Clinical Impressions(s) / UC Diagnoses   Final diagnoses:  Diarrhea, unspecified type  Hematochezia     Discharge Instructions     Keep yourself hydrated with clear liquids.  Follow the attached diarrhea diet.  Take Imodium.    Go to the emergency department if you have acute worsening symptoms.    Your stool is positive for blood.  Please call your primary care provider to schedule an appointment to be seen tomorrow.        ED Prescriptions    None     PDMP not reviewed this encounter.   Sharion Balloon, NP 09/11/19 1334

## 2019-09-12 ENCOUNTER — Telehealth: Payer: Self-pay | Admitting: Emergency Medicine

## 2019-09-12 LAB — URINE CULTURE

## 2019-09-12 LAB — NOVEL CORONAVIRUS, NAA: SARS-CoV-2, NAA: NOT DETECTED

## 2019-09-12 LAB — COMPREHENSIVE METABOLIC PANEL WITH GFR
ALT: 15 [IU]/L (ref 0–32)
AST: 17 [IU]/L (ref 0–40)
Albumin/Globulin Ratio: 1.6 (ref 1.2–2.2)
Albumin: 4.6 g/dL (ref 3.7–4.7)
Alkaline Phosphatase: 78 [IU]/L (ref 48–121)
BUN/Creatinine Ratio: 16 (ref 12–28)
BUN: 11 mg/dL (ref 8–27)
Bilirubin Total: 0.3 mg/dL (ref 0.0–1.2)
CO2: 22 mmol/L (ref 20–29)
Calcium: 9.2 mg/dL (ref 8.7–10.3)
Chloride: 101 mmol/L (ref 96–106)
Creatinine, Ser: 0.69 mg/dL (ref 0.57–1.00)
GFR calc Af Amer: 99 mL/min/{1.73_m2}
GFR calc non Af Amer: 86 mL/min/{1.73_m2}
Globulin, Total: 2.8 g/dL (ref 1.5–4.5)
Glucose: 95 mg/dL (ref 65–99)
Potassium: 4.4 mmol/L (ref 3.5–5.2)
Sodium: 139 mmol/L (ref 134–144)
Total Protein: 7.4 g/dL (ref 6.0–8.5)

## 2019-09-12 LAB — CBC
Hematocrit: 44.8 % (ref 34.0–46.6)
Hemoglobin: 14.8 g/dL (ref 11.1–15.9)
MCH: 28.9 pg (ref 26.6–33.0)
MCHC: 33 g/dL (ref 31.5–35.7)
MCV: 88 fL (ref 79–97)
Platelets: 264 10*3/uL (ref 150–450)
RBC: 5.12 x10E6/uL (ref 3.77–5.28)
RDW: 13.5 % (ref 11.7–15.4)
WBC: 8.5 10*3/uL (ref 3.4–10.8)

## 2019-09-12 LAB — SARS-COV-2, NAA 2 DAY TAT

## 2019-09-12 NOTE — Telephone Encounter (Signed)
Called patient and discussed lab results.  She states she is feeling "so much better" today.  Discussed that some stool tests, COVID test, and urine culture still pending.  Patient will follow up with her PCP as discussed.

## 2019-09-13 LAB — GASTROINTESTINAL PANEL BY PCR, STOOL (REPLACES STOOL CULTURE)

## 2019-10-03 DIAGNOSIS — H353131 Nonexudative age-related macular degeneration, bilateral, early dry stage: Secondary | ICD-10-CM | POA: Diagnosis not present

## 2019-10-08 DIAGNOSIS — C4441 Basal cell carcinoma of skin of scalp and neck: Secondary | ICD-10-CM | POA: Diagnosis not present

## 2019-10-17 ENCOUNTER — Telehealth: Payer: Self-pay

## 2019-10-17 MED ORDER — PREDNISONE 10 MG PO TABS: ORAL_TABLET | ORAL | 0 refills | Status: DC

## 2019-10-17 NOTE — Telephone Encounter (Signed)
Pt notified of Dr. Marliss Coots instructions and that Rx was sent to pharmacy

## 2019-10-17 NOTE — Telephone Encounter (Signed)
Sorry to hear that  Where was she stung ?   How much swelling?   The best thing for pain is an anti inflammatory- she has celebrex on her list-is she taking it Continue tylenol  Triamcinolone is fine A cold compress is very helpful  F/u if no imp

## 2019-10-17 NOTE — Telephone Encounter (Signed)
Pt was stung by multiple yellow jackets yesterday. She is not having any allergic reactions but is having a lot of pain. Asking if she could get something called in. She has tried Benadryl, Tylenol, triamcinolone cream, and baking soda. She uses [facility]

## 2019-10-17 NOTE — Telephone Encounter (Signed)
Pt said she was stung in multiple areas on arms and legs, and they are not only painful but pretty swollen as well. Pt said she took 4 benadryl when she was stung and to later on. Pt is already using the triamcinolone and celebrex and it's still not helping. She is also putting ice on stings already. We have no appts today so pt is asking if Dr. Glori Bickers could send in a prednisone taper. She said that has helped in the past

## 2019-10-17 NOTE — Telephone Encounter (Signed)
Watch for mouth/throat swelling  I sent pred taper  It will cause glucose to go up / hunger/thirst and jitteriness while on it   Use ice F/u if no imp

## 2019-11-12 DIAGNOSIS — H2512 Age-related nuclear cataract, left eye: Secondary | ICD-10-CM | POA: Diagnosis not present

## 2019-11-12 DIAGNOSIS — H25043 Posterior subcapsular polar age-related cataract, bilateral: Secondary | ICD-10-CM | POA: Diagnosis not present

## 2019-11-12 DIAGNOSIS — H18413 Arcus senilis, bilateral: Secondary | ICD-10-CM | POA: Diagnosis not present

## 2019-11-12 DIAGNOSIS — H2513 Age-related nuclear cataract, bilateral: Secondary | ICD-10-CM | POA: Diagnosis not present

## 2019-11-12 DIAGNOSIS — H25013 Cortical age-related cataract, bilateral: Secondary | ICD-10-CM | POA: Diagnosis not present

## 2019-11-14 DIAGNOSIS — Z23 Encounter for immunization: Secondary | ICD-10-CM | POA: Diagnosis not present

## 2019-11-21 DIAGNOSIS — D3131 Benign neoplasm of right choroid: Secondary | ICD-10-CM | POA: Diagnosis not present

## 2019-11-21 DIAGNOSIS — H04123 Dry eye syndrome of bilateral lacrimal glands: Secondary | ICD-10-CM | POA: Diagnosis not present

## 2019-11-21 DIAGNOSIS — H2513 Age-related nuclear cataract, bilateral: Secondary | ICD-10-CM | POA: Diagnosis not present

## 2019-12-16 DIAGNOSIS — H2512 Age-related nuclear cataract, left eye: Secondary | ICD-10-CM | POA: Diagnosis not present

## 2019-12-17 DIAGNOSIS — H2511 Age-related nuclear cataract, right eye: Secondary | ICD-10-CM | POA: Diagnosis not present

## 2019-12-27 DIAGNOSIS — Z23 Encounter for immunization: Secondary | ICD-10-CM | POA: Diagnosis not present

## 2019-12-30 DIAGNOSIS — G5603 Carpal tunnel syndrome, bilateral upper limbs: Secondary | ICD-10-CM | POA: Diagnosis not present

## 2020-01-06 DIAGNOSIS — H2511 Age-related nuclear cataract, right eye: Secondary | ICD-10-CM | POA: Diagnosis not present

## 2020-01-09 DIAGNOSIS — G5603 Carpal tunnel syndrome, bilateral upper limbs: Secondary | ICD-10-CM | POA: Diagnosis not present

## 2020-01-14 DIAGNOSIS — L218 Other seborrheic dermatitis: Secondary | ICD-10-CM | POA: Diagnosis not present

## 2020-01-14 DIAGNOSIS — Z08 Encounter for follow-up examination after completed treatment for malignant neoplasm: Secondary | ICD-10-CM | POA: Diagnosis not present

## 2020-01-14 DIAGNOSIS — Z85828 Personal history of other malignant neoplasm of skin: Secondary | ICD-10-CM | POA: Diagnosis not present

## 2020-01-28 ENCOUNTER — Ambulatory Visit (INDEPENDENT_AMBULATORY_CARE_PROVIDER_SITE_OTHER): Payer: Medicare Other | Admitting: Family Medicine

## 2020-01-28 ENCOUNTER — Encounter: Payer: Self-pay | Admitting: Family Medicine

## 2020-01-28 ENCOUNTER — Other Ambulatory Visit: Payer: Self-pay

## 2020-01-28 VITALS — BP 156/84 | HR 61 | Temp 97.7°F | Ht 68.0 in | Wt 248.4 lb

## 2020-01-28 DIAGNOSIS — M542 Cervicalgia: Secondary | ICD-10-CM

## 2020-01-28 DIAGNOSIS — R030 Elevated blood-pressure reading, without diagnosis of hypertension: Secondary | ICD-10-CM | POA: Diagnosis not present

## 2020-01-28 MED ORDER — METHOCARBAMOL 500 MG PO TABS: 500.0000 mg | ORAL_TABLET | Freq: Two times a day (BID) | ORAL | 0 refills | Status: DC | PRN

## 2020-01-28 MED ORDER — GABAPENTIN 100 MG PO CAPS
ORAL_CAPSULE | ORAL | 1 refills | Status: DC
Start: 2020-01-28 — End: 2020-02-19

## 2020-01-28 NOTE — Patient Instructions (Addendum)
I think neck pain is coming from arthritis in the neck.  Treat with robaxin muscle relaxant at night time and may try tapering dose of gabapentin (nerve pain medicine) as per instructions.  Continue celebrex, gentle stretching to neck, and heating pad to neck.  Let us know if not improving to consider xrays.  Keep [place] updated on effect of medicines.

## 2020-01-28 NOTE — Progress Notes (Signed)
Patient ID: Lauren [name], female    DOB: 03-19-1945, 74 y.o.   MRN: [id number]  This visit was conducted in person.  BP (!) 156/84 (BP Location: Right Arm, Patient Position: Sitting, Cuff Size: Large)   Pulse 61   Temp 97.7 F (36.5 C) (Temporal)   Ht 5\' 8"  (1.727 m)   Wt 248 lb 6 oz (112.7 kg)   SpO2 98%   BMI 37.77 kg/m   BP Readings from Last 3 Encounters:  01/28/20 (!) 156/84  09/11/19 135/83  08/08/19 (!) 142/82    CC: acute on chronic neck pain  Subjective:   HPI: Lauren [name] is a 74 y.o. female presenting on 01/28/2020 for Neck Pain (C/o chronic neck pain that worsened on 01/24/20.  )   Acute worsening of chronic R posterior neck pain that started last week. Mild radiation of pain to R shoulder. Denies inciting trauma/injury or falls. Doesn't remember triggering event. No numbness or weakness of arms. Neck pain improves when she goes to the pool.    Cancelled recent trip to visit grandchildren due to neck pain.   Known chronic lower back pain s/p lumbar surgery, known lumbar DDD.  Known sjogren's syndrome.  Known CTS: EMG 02/2018 - Impression:  This is an abnormal electrodiagnostic exam consistent with 1)  Right mild (grade II) carpal tunnel syndrome (median nerve  entrapment at wrist). 2) Left moderate (grade III) carpal tunnel  syndrome (median nerve entrapment at wrist).   Regularly takes celebrex 200mg  daily.  She also took tylenol and ibuprofen and benadryl without much relief.      Relevant past medical, surgical, family and social history reviewed and updated as indicated. Interim medical history since our last visit reviewed. Allergies and medications reviewed and updated. Outpatient Medications Prior to Visit  Medication Sig Dispense Refill  . celecoxib (CELEBREX) 200 MG capsule Take 1 capsule (200 mg total) by mouth daily. With food 90 capsule 3  . cholecalciferol (VITAMIN D) 1000 units tablet Take 1,000 Units by mouth daily.    Marland Kitchen EPINEPHrine 0.3  mg/0.3 mL IJ SOAJ injection Inject 0.3 mLs (0.3 mg total) into the muscle once. 1 Device 5  . ezetimibe (ZETIA) 10 MG tablet Take 1 tablet (10 mg total) by mouth daily. 90 tablet 3  . Fexofenadine HCl (ALLEGRA PO) Take 1 tablet by mouth daily.    . Fish Oil-Cholecalciferol (OMEGA-3 + VITAMIN D3 PO) Take 2 capsules by mouth daily.     Marland Kitchen FLUoxetine (PROZAC) 20 MG capsule Take 1 capsule (20 mg total) by mouth daily. 90 capsule 3  . omeprazole (PRILOSEC) 20 MG capsule Take 20 mg by mouth daily.      Marland Kitchen oxybutynin (OXYTROL) 3.9 MG/24HR Place 1 patch onto the skin 2 (two) times a week. 24 patch 3  . pilocarpine (SALAGEN) 5 MG tablet Take 1 tablet (5 mg total) by mouth 3 (three) times daily. 270 tablet 3  . valACYclovir (VALTREX) 500 MG tablet TAKE 1 TABLET BY MOUTH TWICE DAILY AS NEEDED FOR OUTBREAK/RASH 14 tablet 3  . predniSONE (DELTASONE) 10 MG tablet Take 3 pills once daily by mouth for 3 days, then 2 pills once daily for 3 days, then 1 pill once daily for 3 days and then stop 18 tablet 0  . triamcinolone cream (KENALOG) 0.1 % Apply 1 application topically 3 (three) times daily. To affected rash areas/ insect bites 30 g 0   No facility-administered medications prior to visit.     Per HPI  unless specifically indicated in ROS section below Review of Systems Objective:  BP (!) 156/84 (BP Location: Right Arm, Patient Position: Sitting, Cuff Size: Large)   Pulse 61   Temp 97.7 F (36.5 C) (Temporal)   Ht 5\' 8"  (1.727 m)   Wt 248 lb 6 oz (112.7 kg)   SpO2 98%   BMI 37.77 kg/m   Wt Readings from Last 3 Encounters:  01/28/20 248 lb 6 oz (112.7 kg)  08/08/19 249 lb 12.8 oz (113.3 kg)  01/25/19 258 lb (117 kg)      Physical Exam Vitals and nursing note reviewed.  Constitutional:      Appearance: Normal appearance. She is not ill-appearing.  Musculoskeletal:        General: Tenderness present. No swelling. Normal range of motion.     Cervical back: Normal range of motion and neck supple. No  rigidity.     Comments:  FROM at cervical neck with discomfort with ROM to right  No midline cervical spine neck tenderness Discomfort to palpation R paracervical mm into R occiput   Lymphadenopathy:     Cervical: No cervical adenopathy.  Skin:    General: Skin is warm and dry.     Findings: No rash.  Neurological:     Mental Status: She is alert.     Comments:  Grip strength intact 5/5 strength BUE Sensation intact BUE Spurling test negative bilaterally but she does have reproducible pain localized to R neck when testing right side (without radiculitis)       Assessment & Plan:  This visit occurred during the SARS-CoV-2 public health emergency.  Safety protocols were in place, including screening questions prior to the visit, additional usage of staff PPE, and extensive cleaning of exam room while observing appropriate contact time as indicated for disinfecting solutions.   Problem List Items Addressed This Visit    Neck pain on right side - Primary    Acute on chronic R neck pain without radiculitis.  Anticipate osteoarthritis related neck pain.  No red flags today.  She will continue celebrex, will trial tapering dose of gabapentin along with robaxin muscle relaxant. Reviewed possible gabapentin side effects including mood changes, dizziness at higher doses as well as sedation precautions with this and robaxin.  Discussed tylenol use, heating pad, gentle stretching exercises. Update if not improving with treatment to return for cervical films. Pt agrees with plan.       Elevated blood pressure reading    Isolated elevation in setting of increased neck pain.  Will continue to monitor but no need to add medication at this time.           Meds ordered this encounter  Medications  . gabapentin (NEURONTIN) 100 MG capsule    Sig: Take 1 capsule (100 mg total) by mouth in the morning and at bedtime for 3 days, THEN 2 capsules (200 mg total) in the morning and at bedtime for 3  days, THEN 3 capsules (300 mg total) in the morning and at bedtime.    Dispense:  120 capsule    Refill:  1  . methocarbamol (ROBAXIN) 500 MG tablet    Sig: Take 1 tablet (500 mg total) by mouth 2 (two) times daily as needed for muscle spasms (sedation precautions).    Dispense:  40 tablet    Refill:  0   No orders of the defined types were placed in this encounter.   Patient Instructions  I think neck pain is coming  from arthritis in the neck.  Treat with robaxin muscle relaxant at night time and may try tapering dose of gabapentin (nerve pain medicine) as per instructions.  Continue celebrex, gentle stretching to neck, and heating pad to neck.  Let us know if not improving to consider xrays.  Keep [place] updated on effect of medicines.   Follow up plan: Return if symptoms worsen or fail to improve.  Ria Bush, MD

## 2020-01-28 NOTE — Assessment & Plan Note (Signed)
Isolated elevation in setting of increased neck pain.  Will continue to monitor but no need to add medication at this time.

## 2020-01-28 NOTE — Assessment & Plan Note (Addendum)
Acute on chronic R neck pain without radiculitis.  Anticipate osteoarthritis related neck pain.  No red flags today.  She will continue celebrex, will trial tapering dose of gabapentin along with robaxin muscle relaxant. Reviewed possible gabapentin side effects including mood changes, dizziness at higher doses as well as sedation precautions with this and robaxin.  Discussed tylenol use, heating pad, gentle stretching exercises. Update if not improving with treatment to return for cervical films. Pt agrees with plan.

## 2020-02-10 ENCOUNTER — Telehealth: Payer: Self-pay | Admitting: Family Medicine

## 2020-02-10 DIAGNOSIS — E538 Deficiency of other specified B group vitamins: Secondary | ICD-10-CM

## 2020-02-10 DIAGNOSIS — R7303 Prediabetes: Secondary | ICD-10-CM

## 2020-02-10 DIAGNOSIS — E7800 Pure hypercholesterolemia, unspecified: Secondary | ICD-10-CM

## 2020-02-10 DIAGNOSIS — Z0000 Encounter for general adult medical examination without abnormal findings: Secondary | ICD-10-CM

## 2020-02-10 DIAGNOSIS — E559 Vitamin D deficiency, unspecified: Secondary | ICD-10-CM

## 2020-02-10 NOTE — Telephone Encounter (Signed)
-----   Message from Aquilla Solian, RT sent at 01/27/2020  2:50 PM EST ----- Regarding: Lab Orders for Wednesday 1.5.2022 Please place lab orders for Wednesday 1.5.2022, office visit for physical on Wednesday 1.12.2022 Thank you, Jones Bales RT(R)

## 2020-02-11 DIAGNOSIS — H40003 Preglaucoma, unspecified, bilateral: Secondary | ICD-10-CM | POA: Diagnosis not present

## 2020-02-12 ENCOUNTER — Other Ambulatory Visit (INDEPENDENT_AMBULATORY_CARE_PROVIDER_SITE_OTHER): Payer: Medicare Other

## 2020-02-12 ENCOUNTER — Ambulatory Visit: Payer: Medicare Other

## 2020-02-12 ENCOUNTER — Other Ambulatory Visit: Payer: Self-pay

## 2020-02-12 DIAGNOSIS — E538 Deficiency of other specified B group vitamins: Secondary | ICD-10-CM

## 2020-02-12 DIAGNOSIS — R7303 Prediabetes: Secondary | ICD-10-CM | POA: Diagnosis not present

## 2020-02-12 DIAGNOSIS — E559 Vitamin D deficiency, unspecified: Secondary | ICD-10-CM | POA: Diagnosis not present

## 2020-02-12 DIAGNOSIS — E7800 Pure hypercholesterolemia, unspecified: Secondary | ICD-10-CM

## 2020-02-12 LAB — CBC WITH DIFFERENTIAL/PLATELET
Basophils Absolute: 0.1 10*3/uL (ref 0.0–0.1)
Basophils Relative: 1 % (ref 0.0–3.0)
Eosinophils Absolute: 0.3 10*3/uL (ref 0.0–0.7)
Eosinophils Relative: 3 % (ref 0.0–5.0)
HCT: 45.8 % (ref 36.0–46.0)
Hemoglobin: 14.9 g/dL (ref 12.0–15.0)
Lymphocytes Relative: 19.9 % (ref 12.0–46.0)
Lymphs Abs: 1.9 10*3/uL (ref 0.7–4.0)
MCHC: 32.5 g/dL (ref 30.0–36.0)
MCV: 86.4 fl (ref 78.0–100.0)
Monocytes Absolute: 0.7 10*3/uL (ref 0.1–1.0)
Monocytes Relative: 7.8 % (ref 3.0–12.0)
Neutro Abs: 6.3 10*3/uL (ref 1.4–7.7)
Neutrophils Relative %: 68.3 % (ref 43.0–77.0)
Platelets: 188 10*3/uL (ref 150.0–400.0)
RBC: 5.3 Mil/uL — ABNORMAL HIGH (ref 3.87–5.11)
RDW: 15.6 % — ABNORMAL HIGH (ref 11.5–15.5)
WBC: 9.3 10*3/uL (ref 4.0–10.5)

## 2020-02-12 LAB — LIPID PANEL
Cholesterol: 193 mg/dL (ref 0–200)
HDL: 53.2 mg/dL (ref >39.00)
LDL Cholesterol: 119 mg/dL — ABNORMAL HIGH (ref 0–99)
NonHDL: 139.37
Total CHOL/HDL Ratio: 4
Triglycerides: 103 mg/dL (ref 0.0–149.0)
VLDL: 20.6 mg/dL (ref 0.0–40.0)

## 2020-02-12 LAB — HEMOGLOBIN A1C: Hgb A1c MFr Bld: 6.1 % (ref 4.6–6.5)

## 2020-02-12 LAB — COMPREHENSIVE METABOLIC PANEL
ALT: 12 U/L (ref 0–35)
AST: 15 U/L (ref 0–37)
Albumin: 4.3 g/dL (ref 3.5–5.2)
Alkaline Phosphatase: 67 U/L (ref 39–117)
BUN: 17 mg/dL (ref 6–23)
CO2: 28 mEq/L (ref 19–32)
Calcium: 9.1 mg/dL (ref 8.4–10.5)
Chloride: 101 mEq/L (ref 96–112)
Creatinine, Ser: 0.62 mg/dL (ref 0.40–1.20)
GFR: 87.48 mL/min (ref >60.00)
Glucose, Bld: 97 mg/dL (ref 70–99)
Potassium: 4.1 mEq/L (ref 3.5–5.1)
Sodium: 137 mEq/L (ref 135–145)
Total Bilirubin: 0.5 mg/dL (ref 0.2–1.2)
Total Protein: 6.9 g/dL (ref 6.0–8.3)

## 2020-02-12 LAB — VITAMIN D 25 HYDROXY (VIT D DEFICIENCY, FRACTURES): VITD: 53.34 ng/mL (ref 30.00–100.00)

## 2020-02-12 LAB — VITAMIN B12: Vitamin B-12: 271 pg/mL (ref 211–911)

## 2020-02-12 NOTE — Progress Notes (Deleted)
Subjective:   Lauren [name] is a 75 y.o. female who presents for Medicare Annual (Subsequent) preventive examination.  Review of Systems: N/A      I connected with the patient today by telephone and verified that I am speaking with the correct person using two identifiers. Location patient: home Location nurse: work Persons participating in the telephone visit: patient, nurse.   I discussed the limitations, risks, security and privacy concerns of performing an evaluation and management service by telephone and the availability of in person appointments. I also discussed with the patient that there may be a patient responsible charge related to this service. The patient expressed understanding and verbally consented to this telephonic visit.              Objective:    There were no vitals filed for this visit. There is no height or weight on file to calculate BMI.  Advanced Directives 01/21/2019 01/11/2018 01/04/2017 12/22/2015  Does Patient Have a Medical Advance Directive? Yes Yes Yes Yes  Type of Paramedic of [place];Living will [place];Living will [place];Living will [place];Living will  Does patient want to make changes to medical advance directive? - - - No - Patient declined  Copy of [place] in Chart? No - copy requested No - copy requested No - copy requested No - copy requested    Current Medications (verified) Outpatient Encounter Medications as of 02/12/2020  Medication Sig  . celecoxib (CELEBREX) 200 MG capsule Take 1 capsule (200 mg total) by mouth daily. With food  . cholecalciferol (VITAMIN D) 1000 units tablet Take 1,000 Units by mouth daily.  Marland Kitchen EPINEPHrine 0.3 mg/0.3 mL IJ SOAJ injection Inject 0.3 mLs (0.3 mg total) into the muscle once.  . ezetimibe (ZETIA) 10 MG tablet Take 1 tablet (10 mg total) by mouth daily.  Marland Kitchen Fexofenadine HCl (ALLEGRA PO) Take 1  tablet by mouth daily.  . Fish Oil-Cholecalciferol (OMEGA-3 + VITAMIN D3 PO) Take 2 capsules by mouth daily.   Marland Kitchen FLUoxetine (PROZAC) 20 MG capsule Take 1 capsule (20 mg total) by mouth daily.  Marland Kitchen gabapentin (NEURONTIN) 100 MG capsule Take 1 capsule (100 mg total) by mouth in the morning and at bedtime for 3 days, THEN 2 capsules (200 mg total) in the morning and at bedtime for 3 days, THEN 3 capsules (300 mg total) in the morning and at bedtime.  . methocarbamol (ROBAXIN) 500 MG tablet Take 1 tablet (500 mg total) by mouth 2 (two) times daily as needed for muscle spasms (sedation precautions).  Marland Kitchen omeprazole (PRILOSEC) 20 MG capsule Take 20 mg by mouth daily.    Marland Kitchen oxybutynin (OXYTROL) 3.9 MG/24HR Place 1 patch onto the skin 2 (two) times a week.  . pilocarpine (SALAGEN) 5 MG tablet Take 1 tablet (5 mg total) by mouth 3 (three) times daily.  . valACYclovir (VALTREX) 500 MG tablet TAKE 1 TABLET BY MOUTH TWICE DAILY AS NEEDED FOR OUTBREAK/RASH   No facility-administered encounter medications on file as of 02/12/2020.    Allergies (verified) Bee venom and Statins   History: Past Medical History:  Diagnosis Date  . Breast cancer ([place])    DCIS; BRCA 1 pos HER2 pos, ER2 pos  . DDD (degenerative disc disease)   . GERD (gastroesophageal reflux disease)   . HLD (hyperlipidemia)    statin intolerant  . Hyperglycemia 9/09   A1C elvated to 6.2  . Osteoarthritis   .  Osteopenia 2007   ? from xray with nl dexa  . Reactive depression (situational)    very well controlled, and now takes prozac for sleep  . Sjogren's syndrome ([place])   . Urine incontinence   . Vitamin D deficiency    Past Surgical History:  Procedure Laterality Date  . BILATERAL OOPHORECTOMY  205  . COLONOSCOPY  5/07   normal; recheck 3 years  . LUMBAR DISC SURGERY  1991   Ruptured disk L5  . MASTECTOMY  2001   bilateral  . TONSILLECTOMY  1974  . TOTAL HIP ARTHROPLASTY  2011  . TOTAL KNEE ARTHROPLASTY  2006   bilateral    Family History  Problem Relation Age of Onset  . Arthritis Other        family hx  . Breast cancer Other        1st degree relative <50  . Diabetes Other        1st degree relative  . Hyperlipidemia Other        family hx  . Hypertension Other        family hx  . Ovarian cancer Other        family hx  . Prostate cancer Other        1st degree relative <50  . Stroke Other        family hx  . Dementia Mother   . Psoriasis Brother   . Other Daughter        BRCA gene  . Brain cancer Brother    Social History   Socioeconomic History  . Marital status: Widowed    Spouse name: Not on file  . Number of children: 3  . Years of education: Not on file  . Highest education level: Not on file  Occupational History  . Occupation: Retired  Tobacco Use  . Smoking status: Never Smoker  . Smokeless tobacco: Never Used  Vaping Use  . Vaping Use: Never used  Substance and Sexual Activity  . Alcohol use: Yes    Alcohol/week: 0.0 standard drinks    Comment: occassional wine  . Drug use: No  . Sexual activity: Not Currently  Other Topics Concern  . Not on file  Social History Narrative   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      Regular exercise-yes   Social Determinants of Health   Financial Resource Strain: Not on file  Food Insecurity: Not on file  Transportation Needs: Not on file  Physical Activity: Not on file  Stress: Not on file  Social Connections: Not on file    Tobacco Counseling Counseling given: Not Answered   Clinical Intake:                 Diabetic: No Nutrition Risk Assessment:  Has the patient had any N/V/D within the last 2 months?  {YES/NO:21197} Does the patient have any non-healing wounds?  {YES/NO:21197} Has the patient had any unintentional weight loss or weight gain?  {YES/NO:21197}  Diabetes:  Is the patient diabetic?  No  If diabetic, was a CBG obtained today?  N/A Did  the patient bring in their glucometer from home?  N/A How often do you monitor your CBG's? N/A.   Financial Strains and Diabetes Management:  Are you having any financial strains with the device, your supplies or your medication? N/A.  Does the patient want to be seen  by Chronic Care Management for management of their diabetes?  N/A Would the patient like to be referred to a Nutritionist or for Diabetic Management?  N/A           Activities of Daily Living No flowsheet data found.  Patient Care Team: Tower, Wynelle Fanny, MD as PCP - General Sharol Roussel, OD as Physician Assistant (Optometry) Lorelee Cover., MD as Referring Physician (Ophthalmology) Laverle Hobby, MD as Consulting Physician (Pulmonary Disease) Marc Morgans, DMD, PA as Referring Physician (Dentistry)  Indicate any recent Medical Services you may have received from other than [facility] providers in the past year (date may be approximate).     Assessment:   This is a routine wellness examination for Sonal.  Hearing/Vision screen No exam data present  Dietary issues and exercise activities discussed:    Goals    . Increase physical activity     Starting 01/11/2018, I will continue to walk at least 20 min daily.     . Patient Stated     01/21/2019, I will try to eat a healthier diet and lessen my sugar intake.       Depression Screen PHQ 2/9 Scores 01/21/2019 01/11/2018 01/04/2017 12/22/2015 12/24/2014 12/03/2013 02/15/2012  PHQ - 2 Score 0 0 0 0 0 0 0  PHQ- 9 Score 0 0 0 - - - -    Fall Risk Fall Risk  01/21/2019 12/28/2018 01/11/2018 01/04/2017 01/14/2016  Falls in the past year? 0 0 0 No No  Comment - Emmi Telephone Survey: data to providers prior to load - - Emmi Telephone Survey: data to providers prior to load  Number falls in past yr: 0 - - - -  Injury with Fall? 0 - - - -  Risk for fall due to : Medication side effect - - - -  Follow up Falls evaluation completed;Falls prevention discussed - - -  -    FALL RISK PREVENTION PERTAINING TO THE HOME:  Any stairs in or around the home? Yes  If so, are there any without handrails? No  Home free of loose throw rugs in walkways, pet beds, electrical cords, etc? Yes  Adequate lighting in your home to reduce risk of falls? Yes   ASSISTIVE DEVICES UTILIZED TO PREVENT FALLS:  Life alert? {YES/NO:21197} Use of a cane, walker or w/c? {YES/NO:21197} Grab bars in the bathroom? {YES/NO:21197} Shower chair or bench in shower? {YES/NO:21197} Elevated toilet seat or a handicapped toilet? {YES/NO:21197}  TIMED UP AND GO:  Was the test performed? N/A, telephone visit.   Cognitive Function: MMSE - Mini Mental State Exam 01/21/2019 01/11/2018 01/04/2017 12/22/2015  Orientation to time '5 5 5 5  ' Orientation to Place '5 5 5 5  ' Registration '3 3 3 3  ' Attention/ Calculation 5 0 0 0  Recall '3 3 3 3  ' Language- name 2 objects - 0 0 0  Language- repeat '1 1 1 1  ' Language- follow 3 step command - '3 3 3  ' Language- read & follow direction - 0 0 0  Write a sentence - 0 0 0  Copy design - 0 0 0  Total score - '20 20 20  ' Mini Cog  Mini-Cog screen was completed. Maximum score is 22. A value of 0 denotes this part of the MMSE was not completed or the patient failed this part of the Mini-Cog screening.       Immunizations Immunization History  Administered Date(s) Administered  . HPV Bivalent 11/10/2016  . Influenza Split  11/26/2010  . Influenza Whole 12/06/2009  . Influenza, High Dose Seasonal PF 12/31/2019  . Influenza, Seasonal, Injecte, Preservative Fre 10/23/2015  . Influenza,inj,Quad PF,6+ Mos 11/26/2013, 11/10/2014, 11/10/2016, 11/22/2017, 10/30/2018  . Influenza-Unspecified 11/26/2013, 11/10/2014, 11/10/2016, 11/22/2017, 10/30/2018  . PFIZER SARS-COV-2 Vaccination 02/28/2019, 03/21/2019, 12/27/2019  . Pneumococcal Conjugate-13 12/24/2014, 05/31/2016  . Pneumococcal Polysaccharide-23 01/19/2010, 01/08/2018  . Td 10/13/2006  . Zoster  02/07/2005  . Zoster Recombinat (Shingrix) 05/31/2016, 08/23/2016    TDAP status: Up to date  Flu Vaccine status: Up to date  Pneumococcal vaccine status: Up to date  Covid-19 vaccine status: Completed vaccines  Qualifies for Shingles Vaccine? Yes   Zostavax completed Yes   Shingrix Completed?: Yes  Screening Tests Health Maintenance  Topic Date Due  . Fecal DNA (Cologuard)  09/08/2019  . PAP SMEAR-Modifier  01/05/2024 (Originally 02/14/2012)  . TETANUS/TDAP  07/08/2025  . INFLUENZA VACCINE  Completed  . DEXA SCAN  Completed  . COVID-19 Vaccine  Completed  . Hepatitis C Screening  Completed  . PNA vac Low Risk Adult  Completed  . MAMMOGRAM  Discontinued    Health Maintenance  Health Maintenance Due  Topic Date Due  . Fecal DNA (Cologuard)  09/08/2019    {Colorectal cancer screening:2101809}  Mammogram status: No longer required due to masectomy.  Bone Density status: Completed 02/20/2017. Results reflect: Bone density results: NORMAL. Repeat every 2-5 years.  Lung Cancer Screening: (Low Dose CT Chest recommended if Age 61-80 years, 30 pack-year currently smoking OR have quit w/in 15years.) does not qualify.  Additional Screening:  Hepatitis C Screening: does qualify; Completed 02/13/2018  Vision Screening: Recommended annual ophthalmology exams for early detection of glaucoma and other disorders of the eye. Is the patient up to date with their annual eye exam?  {YES/NO:21197} Who is the provider or what is the name of the office in which the patient attends annual eye exams? *** If pt is not established with a provider, would they like to be referred to a provider to establish care? No .   Dental Screening: Recommended annual dental exams for proper oral hygiene  Community Resource Referral / Chronic Care Management: CRR required this visit?  No   CCM required this visit?  No      Plan:     I have personally reviewed and noted the following in the  patient's chart:   . Medical and social history . Use of alcohol, tobacco or illicit drugs  . Current medications and supplements . Functional ability and status . Nutritional status . Physical activity . Advanced directives . List of other physicians . Hospitalizations, surgeries, and ER visits in previous 12 months . Vitals . Screenings to include cognitive, depression, and falls . Referrals and appointments  In addition, I have reviewed and discussed with patient certain preventive protocols, quality metrics, and best practice recommendations. A written personalized care plan for preventive services as well as general preventive health recommendations were provided to patient.   Due to this being a telephonic visit, the after visit summary with patients personalized plan was offered to patient via office or my-chart. Patient preferred to pick up at office at next visit or via mychart.   Andrez Grime, LPN   09/13/7670

## 2020-02-14 ENCOUNTER — Other Ambulatory Visit: Payer: Self-pay

## 2020-02-14 ENCOUNTER — Ambulatory Visit (INDEPENDENT_AMBULATORY_CARE_PROVIDER_SITE_OTHER): Payer: Medicare Other

## 2020-02-14 DIAGNOSIS — Z0000 Encounter for general adult medical examination without abnormal findings: Secondary | ICD-10-CM

## 2020-02-14 NOTE — Progress Notes (Signed)
Subjective:   Lauren [name] is a 75 y.o. female who presents for Medicare Annual (Subsequent) preventive examination.  Review of Systems: N/A      I connected with the patient today by telephone and verified that I am speaking with the correct person using two identifiers. Location patient: home Location nurse: work Persons participating in the telephone visit: patient, nurse.   I discussed the limitations, risks, security and privacy concerns of performing an evaluation and management service by telephone and the availability of in person appointments. I also discussed with the patient that there may be a patient responsible charge related to this service. The patient expressed understanding and verbally consented to this telephonic visit.        Cardiac Risk Factors include: advanced age (>30mn, >>72women);Other (see comment), Risk factor comments: hyperlipidemia     Objective:    Today's Vitals   02/14/20 1444  PainSc: 0-No pain   There is no height or weight on file to calculate BMI.  Advanced Directives 02/14/2020 01/21/2019 01/11/2018 01/04/2017 12/22/2015  Does Patient Have a Medical Advance Directive? No Yes Yes Yes Yes  Type of Advance Directive - [place] will  Does patient want to make changes to medical advance directive? - - - - No - Patient declined  Copy of [place] Chart? - No - copy requested No - copy requested No - copy requested No - copy requested  Would patient like information on creating a medical advance directive? Yes (MAU/Ambulatory/Procedural Areas - Information given) - - - -    Current Medications (verified) Outpatient Encounter Medications as of 02/14/2020  Medication Sig  . celecoxib (CELEBREX) 200 MG capsule Take 1 capsule (200 mg total) by mouth daily. With food  .  cholecalciferol (VITAMIN D) 1000 units tablet Take 1,000 Units by mouth daily.  .Marland KitchenEPINEPHrine 0.3 mg/0.3 mL IJ SOAJ injection Inject 0.3 mLs (0.3 mg total) into the muscle once.  . ezetimibe (ZETIA) 10 MG tablet Take 1 tablet (10 mg total) by mouth daily.  .Marland KitchenFexofenadine HCl (ALLEGRA PO) Take 1 tablet by mouth daily.  . Fish Oil-Cholecalciferol (OMEGA-3 + VITAMIN D3 PO) Take 2 capsules by mouth daily.   .Marland KitchenFLUoxetine (PROZAC) 20 MG capsule Take 1 capsule (20 mg total) by mouth daily.  .Marland Kitchengabapentin (NEURONTIN) 100 MG capsule Take 1 capsule (100 mg total) by mouth in the morning and at bedtime for 3 days, THEN 2 capsules (200 mg total) in the morning and at bedtime for 3 days, THEN 3 capsules (300 mg total) in the morning and at bedtime.  . methocarbamol (ROBAXIN) 500 MG tablet Take 1 tablet (500 mg total) by mouth 2 (two) times daily as needed for muscle spasms (sedation precautions).  .Marland Kitchenomeprazole (PRILOSEC) 20 MG capsule Take 20 mg by mouth daily.  .Marland Kitchenoxybutynin (OXYTROL) 3.9 MG/24HR Place 1 patch onto the skin 2 (two) times a week.  . pilocarpine (SALAGEN) 5 MG tablet Take 1 tablet (5 mg total) by mouth 3 (three) times daily.  . valACYclovir (VALTREX) 500 MG tablet TAKE 1 TABLET BY MOUTH TWICE DAILY AS NEEDED FOR OUTBREAK/RASH   No facility-administered encounter medications on file as of 02/14/2020.    Allergies (verified) Bee venom and Statins   History: Past Medical History:  Diagnosis Date  . Breast cancer ([place]    DCIS; BRCA 1 pos HER2 pos,  ER2 pos  . DDD (degenerative disc disease)   . GERD (gastroesophageal reflux disease)   . HLD (hyperlipidemia)    statin intolerant  . Hyperglycemia 9/09   A1C elvated to 6.2  . Osteoarthritis   . Osteopenia 2007   ? from xray with nl dexa  . Reactive depression (situational)    very well controlled, and now takes prozac for sleep  . Sjogren's syndrome ([place])   . Urine incontinence   . Vitamin D deficiency    Past Surgical History:   Procedure Laterality Date  . BILATERAL OOPHORECTOMY  205  . CATARACT EXTRACTION, BILATERAL     10/2019, 12/2019  . COLONOSCOPY  5/07   normal; recheck 3 years  . LUMBAR DISC SURGERY  1991   Ruptured disk L5  . MASTECTOMY  2001   bilateral  . TONSILLECTOMY  1974  . TOTAL HIP ARTHROPLASTY  2011  . TOTAL KNEE ARTHROPLASTY  2006   bilateral   Family History  Problem Relation Age of Onset  . Arthritis Other        family hx  . Breast cancer Other        1st degree relative <50  . Diabetes Other        1st degree relative  . Hyperlipidemia Other        family hx  . Hypertension Other        family hx  . Ovarian cancer Other        family hx  . Prostate cancer Other        1st degree relative <50  . Stroke Other        family hx  . Dementia Mother   . Psoriasis Brother   . Other Daughter        BRCA gene  . Brain cancer Brother    Social History   Socioeconomic History  . Marital status: Widowed    Spouse name: Not on file  . Number of children: 3  . Years of education: Not on file  . Highest education level: Not on file  Occupational History  . Occupation: Retired  Tobacco Use  . Smoking status: Never Smoker  . Smokeless tobacco: Never Used  Vaping Use  . Vaping Use: Never used  Substance and Sexual Activity  . Alcohol use: Not Currently    Alcohol/week: 0.0 standard drinks  . Drug use: No  . Sexual activity: Not Currently  Other Topics Concern  . Not on file  Social History Narrative   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      Regular exercise-yes   Social Determinants of Health   Financial Resource Strain: [place]   . Difficulty of Paying Living Expenses: Not hard at all  Food Insecurity: No Food Insecurity  . Worried About Charity fundraiser in the Last Year: Never true  . Ran Out of Food in the Last Year: Never true  Transportation Needs: No Transportation Needs  . Lack of Transportation  (Medical): No  . Lack of Transportation (Non-Medical): No  Physical Activity: Inactive  . Days of Exercise per Week: 0 days  . Minutes of Exercise per Session: 0 min  Stress: No Stress Concern Present  . Feeling of Stress : Not at all  Social Connections: Not on file    Tobacco Counseling Counseling given: Not Answered   Clinical Intake:  Pre-visit  preparation completed: Yes  Pain : No/denies pain Pain Score: 0-No pain     Nutritional Risks: None Diabetes: No  How often do you need to have someone help you when you read instructions, pamphlets, or other written materials from your doctor or pharmacy?: 1 - Never What is the last grade level you completed in school?: post masters degree  Diabetic: No Nutrition Risk Assessment:  Has the patient had any N/V/D within the last 2 months?  No  Does the patient have any non-healing wounds?  No  Has the patient had any unintentional weight loss or weight gain?  No   Diabetes:  Is the patient diabetic?  No  If diabetic, was a CBG obtained today?  N/A Did the patient bring in their glucometer from home?  N/A How often do you monitor your CBG's? N/A.   Financial Strains and Diabetes Management:  Are you having any financial strains with the device, your supplies or your medication? N/A.  Does the patient want to be seen by Chronic Care Management for management of their diabetes?  N/A Would the patient like to be referred to a Nutritionist or for Diabetic Management?  N/A  Interpreter Needed?: No  Information entered by :: CJohnson, LPN   Activities of Daily Living In your present state of health, do you have any difficulty performing the following activities: 02/14/2020  Hearing? N  Vision? N  Difficulty concentrating or making decisions? Y  Comment some memory issues noted  Walking or climbing stairs? N  Dressing or bathing? N  Doing errands, shopping? N  Preparing Food and eating ? N  Using the Toilet? N  In the  past six months, have you accidently leaked urine? Y  Comment wears pads  Do you have problems with loss of bowel control? N  Managing your Medications? N  Managing your Finances? N  Housekeeping or managing your Housekeeping? N  Some recent data might be hidden    Patient Care Team: Tower, Wynelle Fanny, MD as PCP - General Sharol Roussel, OD as Physician Assistant (Optometry) Lorelee Cover., MD as Referring Physician (Ophthalmology) Laverle Hobby, MD as Consulting Physician (Pulmonary Disease) Marc Morgans, DMD, PA as Referring Physician (Dentistry)  Indicate any recent Medical Services you may have received from other than [facility] providers in the past year (date may be approximate).     Assessment:   This is a routine wellness examination for Lauren [name].  Hearing/Vision screen  Hearing Screening   '125Hz'  '250Hz'  '500Hz'  '1000Hz'  '2000Hz'  '3000Hz'  '4000Hz'  '6000Hz'  '8000Hz'   Right ear:           Left ear:           Vision Screening Comments: Patient gets annual eye exams   Dietary issues and exercise activities discussed: Current Exercise Habits: The patient does not participate in regular exercise at present, Exercise limited by: None identified  Goals    . Increase physical activity     Starting 01/11/2018, I will continue to walk at least 20 min daily.     . Patient Stated     01/21/2019, I will try to eat a healthier diet and lessen my sugar intake.     . Patient Stated     02/14/2020, I will maintain and continue medications as prescribed.       Depression Screen PHQ 2/9 Scores 02/14/2020 01/21/2019 01/11/2018 01/04/2017 12/22/2015 12/24/2014 12/03/2013  PHQ - 2 Score 0 0 0 0 0 0 0  PHQ- 9 Score 0 0  0 0 - - -    Fall Risk Fall Risk  02/14/2020 01/21/2019 12/28/2018 01/11/2018 01/04/2017  Falls in the past year? 0 0 0 0 No  Comment - - Emmi Telephone Survey: data to providers prior to load - -  Number falls in past yr: 0 0 - - -  Injury with Fall? 0 0 - - -  Risk for fall due to : No  Fall Risks Medication side effect - - -  Follow up Falls evaluation completed;Falls prevention discussed Falls evaluation completed;Falls prevention discussed - - -    FALL RISK PREVENTION PERTAINING TO THE HOME:  Any stairs in or around the home? Yes  If so, are there any without handrails? No  Home free of loose throw rugs in walkways, pet beds, electrical cords, etc? Yes  Adequate lighting in your home to reduce risk of falls? Yes   ASSISTIVE DEVICES UTILIZED TO PREVENT FALLS:  Life alert? No  Use of a cane, walker or w/c? No  Grab bars in the bathroom? No  Shower chair or bench in shower? No  Elevated toilet seat or a handicapped toilet? No   TIMED UP AND GO:  Was the test performed? N/A, telephone visit.   Cognitive Function: MMSE - Mini Mental State Exam 02/14/2020 01/21/2019 01/11/2018 01/04/2017 12/22/2015  Orientation to time '5 5 5 5 5  ' Orientation to Place '5 5 5 5 5  ' Registration '3 3 3 3 3  ' Attention/ Calculation 5 5 0 0 0  Recall '3 3 3 3 3  ' Language- name 2 objects - - 0 0 0  Language- repeat '1 1 1 1 1  ' Language- follow 3 step command - - '3 3 3  ' Language- read & follow direction - - 0 0 0  Write a sentence - - 0 0 0  Copy design - - 0 0 0  Total score - - '20 20 20  ' Mini Cog  Mini-Cog screen was completed. Maximum score is 22. A value of 0 denotes this part of the MMSE was not completed or the patient failed this part of the Mini-Cog screening.       Immunizations Immunization History  Administered Date(s) Administered  . HPV Bivalent 11/10/2016  . Influenza Split 11/26/2010  . Influenza Whole 12/06/2009  . Influenza, High Dose Seasonal PF 12/31/2019  . Influenza, Seasonal, Injecte, Preservative Fre 10/23/2015  . Influenza,inj,Quad PF,6+ Mos 11/26/2013, 11/10/2014, 11/10/2016, 11/22/2017, 10/30/2018  . Influenza-Unspecified 11/26/2013, 11/10/2014, 11/10/2016, 11/22/2017, 10/30/2018  . PFIZER SARS-COV-2 Vaccination 02/28/2019, 03/21/2019, 12/27/2019  .  Pneumococcal Conjugate-13 12/24/2014, 05/31/2016  . Pneumococcal Polysaccharide-23 01/19/2010, 01/08/2018  . Td 10/13/2006  . Zoster 02/07/2005  . Zoster Recombinat (Shingrix) 05/31/2016, 08/23/2016    TDAP status: Up to date  Flu Vaccine status: Up to date  Pneumococcal vaccine status: Up to date  Covid-19 vaccine status: Completed vaccines  Qualifies for Shingles Vaccine? Yes   Zostavax completed Yes   Shingrix Completed?: Yes  Screening Tests Health Maintenance  Topic Date Due  . Fecal DNA (Cologuard)  09/08/2019  . PAP SMEAR-Modifier  01/05/2024 (Originally 02/14/2012)  . TETANUS/TDAP  07/08/2025  . INFLUENZA VACCINE  Completed  . DEXA SCAN  Completed  . COVID-19 Vaccine  Completed  . Hepatitis C Screening  Completed  . PNA vac Low Risk Adult  Completed  . MAMMOGRAM  Discontinued    Health Maintenance  Health Maintenance Due  Topic Date Due  . Fecal DNA (Cologuard)  09/08/2019    Colorectal  cancer screening: Cologuard due, provider will discuss with patient at physical   Mammogram status: No longer required due to masectomy.  Bone Density status: Completed 02/20/2017. Results reflect: Bone density results: NORMAL. Repeat every 2-5 years.  Lung Cancer Screening: (Low Dose CT Chest recommended if Age 33-80 years, 30 pack-year currently smoking OR have quit w/in 15years.) does not qualify.    Additional Screening:  Hepatitis C Screening: does qualify; Completed 02/13/2018  Vision Screening: Recommended annual ophthalmology exams for early detection of glaucoma and other disorders of the eye. Is the patient up to date with their annual eye exam?  Yes  Who is the provider or what is the name of the office in which the patient attends annual eye exams? Dr. Gloriann Loan  If pt is not established with a provider, would they like to be referred to a provider to establish care? No .   Dental Screening: Recommended annual dental exams for proper oral hygiene  Community  Resource Referral / Chronic Care Management: CRR required this visit?  No   CCM required this visit?  No      Plan:     I have personally reviewed and noted the following in the patient's chart:   . Medical and social history . Use of alcohol, tobacco or illicit drugs  . Current medications and supplements . Functional ability and status . Nutritional status . Physical activity . Advanced directives . List of other physicians . Hospitalizations, surgeries, and ER visits in previous 12 months . Vitals . Screenings to include cognitive, depression, and falls . Referrals and appointments  In addition, I have reviewed and discussed with patient certain preventive protocols, quality metrics, and best practice recommendations. A written personalized care plan for preventive services as well as general preventive health recommendations were provided to patient.   Due to this being a telephonic visit, the after visit summary with patients personalized plan was offered to patient via office or my-chart. Patient preferred to pick up at office at next visit or via mychart.   Andrez Grime, LPN   02/12/1094

## 2020-02-14 NOTE — Progress Notes (Signed)
PCP notes:  Health Maintenance: Cologuard- due   Abnormal Screenings: none   Patient concerns: Discuss taking gabapentin for her carpel tunnel  Wants copy if living will document in office  Nurse concerns: none   Next PCP appt.: 02/19/2020 @ 9:30 am

## 2020-02-14 NOTE — Patient Instructions (Signed)
Lauren [name] , Thank you for taking time to come for your Medicare Wellness Visit. I appreciate your ongoing commitment to your health goals. Please review the following plan we discussed and let me know if I can assist you in the future.   Screening recommendations/referrals: Colonoscopy: Cologuard due, provider will discuss with you at physical  Mammogram: no longer required- masectomy Bone Density: Up to date, completed 02/20/2017, due 2-5 years  Recommended yearly ophthalmology/optometry visit for glaucoma screening and checkup Recommended yearly dental visit for hygiene and checkup  Vaccinations: Influenza vaccine: Up to date, completed 12/31/2019, due 12/2020 Pneumococcal vaccine: Completed series Tdap vaccine: Up to date, completed 07/09/2015, due 07/2025 Shingles vaccine: Completed series   Covid-19:Completed series  Advanced directives: Advance directive discussed with you today. I have provided a copy for you to complete at home and have notarized. Once this is complete please bring a copy in to our office so we can scan it into your chart.  Conditions/risks identified: hyperlipidemia  Next appointment: Follow up in one year for your annual wellness visit    Preventive Care 65 Years and Older, Female Preventive care refers to lifestyle choices and visits with your health care provider that can promote health and wellness. What does preventive care include?  A yearly physical exam. This is also called an annual well check.  Dental exams once or twice a year.  Routine eye exams. Ask your health care provider how often you should have your eyes checked.  Personal lifestyle choices, including:  Daily care of your teeth and gums.  Regular physical activity.  Eating a healthy diet.  Avoiding tobacco and drug use.  Limiting alcohol use.  Practicing safe sex.  Taking low-dose aspirin every day.  Taking vitamin and mineral supplements as recommended by your health care  provider. What happens during an annual well check? The services and screenings done by your health care provider during your annual well check will depend on your age, overall health, lifestyle risk factors, and family history of disease. Counseling  Your health care provider may ask you questions about your:  Alcohol use.  Tobacco use.  Drug use.  Emotional well-being.  Home and relationship well-being.  Sexual activity.  Eating habits.  History of falls.  Memory and ability to understand (cognition).  Work and work Statistician.  Reproductive health. Screening  You may have the following tests or measurements:  Height, weight, and BMI.  Blood pressure.  Lipid and cholesterol levels. These may be checked every 5 years, or more frequently if you are over 3 years old.  Skin check.  Lung cancer screening. You may have this screening every year starting at age 27 if you have a 30-pack-year history of smoking and currently smoke or have quit within the past 15 years.  Fecal occult blood test (FOBT) of the stool. You may have this test every year starting at age 60.  Flexible sigmoidoscopy or colonoscopy. You may have a sigmoidoscopy every 5 years or a colonoscopy every 10 years starting at age 66.  Hepatitis C blood test.  Hepatitis B blood test.  Sexually transmitted disease (STD) testing.  Diabetes screening. This is done by checking your blood sugar (glucose) after you have not eaten for a while (fasting). You may have this done every 1-3 years.  Bone density scan. This is done to screen for osteoporosis. You may have this done starting at age 31.  Mammogram. This may be done every 1-2 years. Talk to your health  care provider about how often you should have regular mammograms. Talk with your health care provider about your test results, treatment options, and if necessary, the need for more tests. Vaccines  Your health care provider may recommend certain  vaccines, such as:  Influenza vaccine. This is recommended every year.  Tetanus, diphtheria, and acellular pertussis (Tdap, Td) vaccine. You may need a Td booster every 10 years.  Zoster vaccine. You may need this after age 70.  Pneumococcal 13-valent conjugate (PCV13) vaccine. One dose is recommended after age 51.  Pneumococcal polysaccharide (PPSV23) vaccine. One dose is recommended after age 14. Talk to your health care provider about which screenings and vaccines you need and how often you need them. This information is not intended to replace advice given to you by your health care provider. Make sure you discuss any questions you have with your health care provider. Document Released: 02/20/2015 Document Revised: 10/14/2015 Document Reviewed: 11/25/2014 Elsevier Interactive Patient Education  2017 [place] Prevention in the Home Falls can cause injuries. They can happen to people of all ages. There are many things you can do to make your home safe and to help prevent falls. What can I do on the outside of my home?  Regularly fix the edges of walkways and driveways and fix any cracks.  Remove anything that might make you trip as you walk through a door, such as a raised step or threshold.  Trim any bushes or trees on the path to your home.  Use bright outdoor lighting.  Clear any walking paths of anything that might make someone trip, such as rocks or tools.  Regularly check to see if handrails are loose or broken. Make sure that both sides of any steps have handrails.  Any raised decks and porches should have guardrails on the edges.  Have any leaves, snow, or ice cleared regularly.  Use sand or salt on walking paths during winter.  Clean up any spills in your garage right away. This includes oil or grease spills. What can I do in the bathroom?  Use night lights.  Install grab bars by the toilet and in the tub and shower. Do not use towel bars as grab  bars.  Use non-skid mats or decals in the tub or shower.  If you need to sit down in the shower, use a plastic, non-slip stool.  Keep the floor dry. Clean up any water that spills on the floor as soon as it happens.  Remove soap buildup in the tub or shower regularly.  Attach bath mats securely with double-sided non-slip rug tape.  Do not have throw rugs and other things on the floor that can make you trip. What can I do in the bedroom?  Use night lights.  Make sure that you have a light by your bed that is easy to reach.  Do not use any sheets or blankets that are too big for your bed. They should not hang down onto the floor.  Have a firm chair that has side arms. You can use this for support while you get dressed.  Do not have throw rugs and other things on the floor that can make you trip. What can I do in the kitchen?  Clean up any spills right away.  Avoid walking on wet floors.  Keep items that you use a lot in easy-to-reach places.  If you need to reach something above you, use a strong step stool that has a grab  bar.  Keep electrical cords out of the way.  Do not use floor polish or wax that makes floors slippery. If you must use wax, use non-skid floor wax.  Do not have throw rugs and other things on the floor that can make you trip. What can I do with my stairs?  Do not leave any items on the stairs.  Make sure that there are handrails on both sides of the stairs and use them. Fix handrails that are broken or loose. Make sure that handrails are as long as the stairways.  Check any carpeting to make sure that it is firmly attached to the stairs. Fix any carpet that is loose or worn.  Avoid having throw rugs at the top or bottom of the stairs. If you do have throw rugs, attach them to the floor with carpet tape.  Make sure that you have a light switch at the top of the stairs and the bottom of the stairs. If you do not have them, ask someone to add them for  you. What else can I do to help prevent falls?  Wear shoes that:  Do not have high heels.  Have rubber bottoms.  Are comfortable and fit you well.  Are closed at the toe. Do not wear sandals.  If you use a stepladder:  Make sure that it is fully opened. Do not climb a closed stepladder.  Make sure that both sides of the stepladder are locked into place.  Ask someone to hold it for you, if possible.  Clearly mark and make sure that you can see:  Any grab bars or handrails.  First and last steps.  Where the edge of each step is.  Use tools that help you move around (mobility aids) if they are needed. These include:  Canes.  Walkers.  Scooters.  Crutches.  Turn on the lights when you go into a dark area. Replace any light bulbs as soon as they burn out.  Set up your furniture so you have a clear path. Avoid moving your furniture around.  If any of your floors are uneven, fix them.  If there are any pets around you, be aware of where they are.  Review your medicines with your doctor. Some medicines can make you feel dizzy. This can increase your chance of falling. Ask your doctor what other things that you can do to help prevent falls. This information is not intended to replace advice given to you by your health care provider. Make sure you discuss any questions you have with your health care provider. Document Released: 11/20/2008 Document Revised: 07/02/2015 Document Reviewed: 02/28/2014 Elsevier Interactive Patient Education  2017 [employer].

## 2020-02-17 ENCOUNTER — Ambulatory Visit: Payer: Medicare Other | Admitting: Family Medicine

## 2020-02-19 ENCOUNTER — Other Ambulatory Visit: Payer: Self-pay

## 2020-02-19 ENCOUNTER — Encounter: Payer: Self-pay | Admitting: Family Medicine

## 2020-02-19 ENCOUNTER — Ambulatory Visit (INDEPENDENT_AMBULATORY_CARE_PROVIDER_SITE_OTHER): Payer: Medicare Other | Admitting: Family Medicine

## 2020-02-19 VITALS — BP 131/80 | HR 70 | Temp 97.1°F | Ht 67.5 in | Wt 249.4 lb

## 2020-02-19 DIAGNOSIS — E669 Obesity, unspecified: Secondary | ICD-10-CM

## 2020-02-19 DIAGNOSIS — E538 Deficiency of other specified B group vitamins: Secondary | ICD-10-CM

## 2020-02-19 DIAGNOSIS — R7303 Prediabetes: Secondary | ICD-10-CM

## 2020-02-19 DIAGNOSIS — K219 Gastro-esophageal reflux disease without esophagitis: Secondary | ICD-10-CM

## 2020-02-19 DIAGNOSIS — Z1211 Encounter for screening for malignant neoplasm of colon: Secondary | ICD-10-CM

## 2020-02-19 DIAGNOSIS — E559 Vitamin D deficiency, unspecified: Secondary | ICD-10-CM | POA: Diagnosis not present

## 2020-02-19 DIAGNOSIS — E7800 Pure hypercholesterolemia, unspecified: Secondary | ICD-10-CM | POA: Diagnosis not present

## 2020-02-19 DIAGNOSIS — M3500 Sicca syndrome, unspecified: Secondary | ICD-10-CM

## 2020-02-19 DIAGNOSIS — G4733 Obstructive sleep apnea (adult) (pediatric): Secondary | ICD-10-CM

## 2020-02-19 DIAGNOSIS — Z0000 Encounter for general adult medical examination without abnormal findings: Secondary | ICD-10-CM

## 2020-02-19 DIAGNOSIS — Z853 Personal history of malignant neoplasm of breast: Secondary | ICD-10-CM

## 2020-02-19 MED ORDER — GABAPENTIN 300 MG PO CAPS
300.0000 mg | ORAL_CAPSULE | Freq: Two times a day (BID) | ORAL | 3 refills | Status: DC
Start: 2020-02-19 — End: 2020-09-21

## 2020-02-19 MED ORDER — FLUOXETINE HCL 20 MG PO CAPS: 20.0000 mg | ORAL_CAPSULE | Freq: Every day | ORAL | 3 refills | Status: DC

## 2020-02-19 MED ORDER — PILOCARPINE HCL 5 MG PO TABS: 5.0000 mg | ORAL_TABLET | Freq: Three times a day (TID) | ORAL | 3 refills | Status: DC

## 2020-02-19 MED ORDER — EZETIMIBE 10 MG PO TABS: 10.0000 mg | ORAL_TABLET | Freq: Every day | ORAL | 3 refills | Status: DC

## 2020-02-19 MED ORDER — OXYBUTYNIN 3.9 MG/24HR TD PTTW: 1.0000 | MEDICATED_PATCH | TRANSDERMAL | 3 refills | Status: DC

## 2020-02-19 MED ORDER — CELECOXIB 200 MG PO CAPS: 200.0000 mg | ORAL_CAPSULE | Freq: Every day | ORAL | 3 refills | Status: DC

## 2020-02-19 NOTE — Assessment & Plan Note (Signed)
Taking omeprazole 20 mg daily (works as long as she takes it)  Watching vit B12 and vit D levels  Recommend watching diet  Aware taking celebrex

## 2020-02-19 NOTE — Assessment & Plan Note (Signed)
Order done for cologuard

## 2020-02-19 NOTE — Progress Notes (Signed)
Subjective:    Patient ID: Lauren [name], female    DOB: 10-Jul-1945, 75 y.o.   MRN: [id number]  This visit occurred during the SARS-CoV-2 public health emergency.  Safety protocols were in place, including screening questions prior to the visit, additional usage of staff PPE, and extensive cleaning of exam room while observing appropriate contact time as indicated for disinfecting solutions.    HPI Pt presents for f/u of chronic health problems   Wt Readings from Last 3 Encounters:  02/19/20 249 lb 7 oz (113.1 kg)  01/28/20 248 lb 6 oz (112.7 kg)  08/08/19 249 lb 12.8 oz (113.3 kg)   38.49 kg/m  Doing well overall   Had an episode of neck pain -saw Dr Darnell Level and was px gabapentin  Helped a lot  Helps with all over body pain -makes a big difference  Taking 300 mg bid  No side effects  Wants to continue it   Walking for exercise mainly  15 minutes per day  Also cares for farm animals   amw was 02/14/20  Colon cancer screen-had cologuard 8/18  Wants to do that again   No mammogram due to past mastectomy for breast cancer  No issues   dexa 1/19 -normal range  Falls-none Fractures -none  Supplements =vit D 2000 iu daily  Vitamin D level is good at 53  covid immunized with booster  Stays out of public   BP Readings from Last 3 Encounters:  02/19/20 131/80  01/28/20 (!) 156/84  09/11/19 135/83    Pulse Readings from Last 3 Encounters:  02/19/20 70  01/28/20 61  09/11/19 83   Vit B12 def Lab Results  Component Value Date   VITAMINB12 271 02/12/2020   Oral supplementation -unsure how much she is taking    Prediabetes Lab Results  Component Value Date   HGBA1C 6.1 02/12/2020  this is stable   Diet is so so  Tries to eat good stuff and avoid sugar   Hyperlipidemia Lab Results  Component Value Date   CHOL 193 02/12/2020   CHOL 185 01/21/2019   CHOL 163 01/11/2018   Lab Results  Component Value Date   HDL 53.20 02/12/2020   HDL 48.80 01/21/2019    HDL 49.50 01/11/2018   Lab Results  Component Value Date   LDLCALC 119 (H) 02/12/2020   LDLCALC 116 (H) 01/21/2019   LDLCALC 99 01/11/2018   Lab Results  Component Value Date   TRIG 103.0 02/12/2020   TRIG 98.0 01/21/2019   TRIG 73.0 01/11/2018   Lab Results  Component Value Date   CHOLHDL 4 02/12/2020   CHOLHDL 4 01/21/2019   CHOLHDL 3 01/11/2018   Lab Results  Component Value Date   LDLDIRECT 148.9 08/20/2012   LDLDIRECT 184.7 02/11/2009   Taking zetia and fish oil  Does not tolerate statins  Eats eggs regularly   sjogren's  Uses salagen for dry mouth No changes  Eating sugar makes her worse   Mood  Takes fluoxetine 20 mg daily  Doing well    GERD  Takes omeprazole 20 mg daily (that works well)  Watching vit D and B12  Lab Results  Component Value Date   WBC 9.3 02/12/2020   HGB 14.9 02/12/2020   HCT 45.8 02/12/2020   MCV 86.4 02/12/2020   PLT 188.0 02/12/2020    Takes celebrex for OA Lab Results  Component Value Date   CREATININE 0.62 02/12/2020   BUN 17 02/12/2020   NA 137  02/12/2020   K 4.1 02/12/2020   CL 101 02/12/2020   CO2 28 02/12/2020   Lab Results  Component Value Date   ALT 12 02/12/2020   AST 15 02/12/2020   ALKPHOS 67 02/12/2020   BILITOT 0.5 02/12/2020    Patient Active Problem List   Diagnosis Date Noted  . Elevated blood pressure reading 01/28/2020  . Carpal tunnel syndrome 01/25/2019  . Vitamin B12 deficiency 03/27/2018  . Sjogren's disease ([place]) 01/15/2018  . Osteoarthritis 01/15/2018  . Obesity (BMI 30-39.9) 01/15/2018  . H/O herpes zoster 11/22/2017  . Estrogen deficiency 01/09/2017  . Routine general medical examination at a health care facility 01/01/2017  . OSA (obstructive sleep apnea) 09/18/2016  . Colon cancer screening 12/24/2014  . Bee sting allergy 06/10/2014  . Encounter for Medicare annual wellness exam 12/03/2013  . History of shingles 11/27/2013  . Hx of breast cancer 08/22/2011  . Gynecological  examination 02/14/2011  . Bilateral dry eyes 01/20/2011  . Vitamin D deficiency 04/22/2008  . Hyperlipidemia 04/22/2008  . GERD 04/22/2008  . Prediabetes 04/22/2008   Past Medical History:  Diagnosis Date  . Breast cancer ([place])    DCIS; BRCA 1 pos HER2 pos, ER2 pos  . DDD (degenerative disc disease)   . GERD (gastroesophageal reflux disease)   . HLD (hyperlipidemia)    statin intolerant  . Hyperglycemia 9/09   A1C elvated to 6.2  . Osteoarthritis   . Osteopenia 2007   ? from xray with nl dexa  . Reactive depression (situational)    very well controlled, and now takes prozac for sleep  . Sjogren's syndrome ([place])   . Urine incontinence   . Vitamin D deficiency    Past Surgical History:  Procedure Laterality Date  . BILATERAL OOPHORECTOMY  205  . CATARACT EXTRACTION, BILATERAL     10/2019, 12/2019  . COLONOSCOPY  5/07   normal; recheck 3 years  . LUMBAR DISC SURGERY  1991   Ruptured disk L5  . MASTECTOMY  2001   bilateral  . TONSILLECTOMY  1974  . TOTAL HIP ARTHROPLASTY  2011  . TOTAL KNEE ARTHROPLASTY  2006   bilateral   Social History   Tobacco Use  . Smoking status: Never Smoker  . Smokeless tobacco: Never Used  Vaping Use  . Vaping Use: Never used  Substance Use Topics  . Alcohol use: Not Currently    Alcohol/week: 0.0 standard drinks  . Drug use: No   Family History  Problem Relation Age of Onset  . Arthritis Other        family hx  . Breast cancer Other        1st degree relative <50  . Diabetes Other        1st degree relative  . Hyperlipidemia Other        family hx  . Hypertension Other        family hx  . Ovarian cancer Other        family hx  . Prostate cancer Other        1st degree relative <50  . Stroke Other        family hx  . Dementia Mother   . Psoriasis Brother   . Other Daughter        BRCA gene  . Brain cancer Brother    Allergies  Allergen Reactions  . Bee Venom   . Statins     REACTION: joint pain   Current  Outpatient Medications  on File Prior to Visit  Medication Sig Dispense Refill  . cholecalciferol (VITAMIN D) 1000 units tablet Take 1,000 Units by mouth daily.    Marland Kitchen EPINEPHrine 0.3 mg/0.3 mL IJ SOAJ injection Inject 0.3 mLs (0.3 mg total) into the muscle once. 1 Device 5  . Fexofenadine HCl (ALLEGRA PO) Take 1 tablet by mouth daily.    . Fish Oil-Cholecalciferol (OMEGA-3 + VITAMIN D3 PO) Take 2 capsules by mouth daily.     . methocarbamol (ROBAXIN) 500 MG tablet Take 1 tablet (500 mg total) by mouth 2 (two) times daily as needed for muscle spasms (sedation precautions). 40 tablet 0  . omeprazole (PRILOSEC) 20 MG capsule Take 20 mg by mouth daily.    . valACYclovir (VALTREX) 500 MG tablet TAKE 1 TABLET BY MOUTH TWICE DAILY AS NEEDED FOR OUTBREAK/RASH 14 tablet 3   No current facility-administered medications on file prior to visit.    Review of Systems  Constitutional: Positive for fatigue. Negative for activity change, appetite change, fever and unexpected weight change.  HENT: Negative for congestion, ear pain, rhinorrhea, sinus pressure and sore throat.   Eyes: Negative for pain, redness and visual disturbance.  Respiratory: Negative for cough, shortness of breath and wheezing.   Cardiovascular: Negative for chest pain and palpitations.  Gastrointestinal: Negative for abdominal pain, blood in stool, constipation and diarrhea.  Endocrine: Negative for polydipsia and polyuria.  Genitourinary: Negative for dysuria, frequency and urgency.  Musculoskeletal: Positive for arthralgias. Negative for back pain and myalgias.  Skin: Negative for pallor and rash.  Allergic/Immunologic: Negative for environmental allergies.  Neurological: Negative for dizziness, syncope and headaches.  Hematological: Negative for adenopathy. Does not bruise/bleed easily.  Psychiatric/Behavioral: Negative for decreased concentration and dysphoric mood. The patient is not nervous/anxious.        Objective:    Physical Exam Constitutional:      General: She is not in acute distress.    Appearance: Normal appearance. She is well-developed. She is obese. She is not ill-appearing or diaphoretic.  HENT:     Head: Normocephalic and atraumatic.     Right Ear: Tympanic membrane, ear canal and external ear normal.     Left Ear: Tympanic membrane, ear canal and external ear normal.     Nose: Nose normal. No congestion.     Mouth/Throat:     Mouth: Mucous membranes are moist.     Pharynx: Oropharynx is clear. No posterior oropharyngeal erythema.  Eyes:     General: No scleral icterus.    Extraocular Movements: Extraocular movements intact.     Conjunctiva/sclera: Conjunctivae normal.     Pupils: Pupils are equal, round, and reactive to light.  Neck:     Thyroid: No thyromegaly.     Vascular: No carotid bruit or JVD.  Cardiovascular:     Rate and Rhythm: Normal rate and regular rhythm.     Pulses: Normal pulses.     Heart sounds: Normal heart sounds. No gallop.   Pulmonary:     Effort: Pulmonary effort is normal. No respiratory distress.     Breath sounds: Normal breath sounds. No wheezing.     Comments: Good air exch Chest:     Chest wall: No tenderness.  Abdominal:     General: Bowel sounds are normal. There is no distension or abdominal bruit.     Palpations: Abdomen is soft. There is no mass.     Tenderness: There is no abdominal tenderness.     Hernia: No hernia is present.  Genitourinary:    Comments: Double mastectomy surgical change  No changes or lumps or tenderness    Musculoskeletal:        General: No tenderness. Normal range of motion.     Cervical back: Normal range of motion and neck supple. No rigidity. No muscular tenderness.     Right lower leg: No edema.     Left lower leg: No edema.     Comments: No kyphosis   Lymphadenopathy:     Cervical: No cervical adenopathy.  Skin:    General: Skin is warm and dry.     Coloration: Skin is not pale.     Findings: No erythema  or rash.     Comments: Solar lentigines diffusely   Neurological:     Mental Status: She is alert. Mental status is at baseline.     Cranial Nerves: No cranial nerve deficit.     Motor: No abnormal muscle tone.     Coordination: Coordination normal.     Gait: Gait normal.     Deep Tendon Reflexes: Reflexes are normal and symmetric. Reflexes normal.  Psychiatric:        Mood and Affect: Mood normal.        Cognition and Memory: Cognition and memory normal.           Assessment & Plan:   Problem List Items Addressed This Visit      Respiratory   OSA (obstructive sleep apnea)    Plan to continue cpap        Digestive   GERD    Taking omeprazole 20 mg daily (works as long as she takes it)  Psychiatrist vit B12 and vit D levels  Recommend watching diet  Aware taking celebrex        Other   Vitamin D deficiency    Good level at 53 Vitamin D level is therapeutic with current supplementation Disc importance of this to bone and overall health Plan to continue 2000 iu daily      Hyperlipidemia - Primary    Disc goals for lipids and reasons to control them Rev last labs with pt Rev low sat fat diet in detail Intol of statin  Taking zetia with fish oil Improved HDL  LDL stable in one teens      Relevant Medications   ezetimibe (ZETIA) 10 MG tablet   Prediabetes    Lab Results  Component Value Date   HGBA1C 6.1 02/12/2020  disc imp of low glycemic diet and wt loss to prevent DM2       Hx of breast cancer    S/p mastectomy No re occurrence       Colon cancer screening    Order done for cologuard       Routine general medical examination at a health care facility    .Reviewed health habits including diet and exercise and skin cancer prevention Reviewed appropriate screening tests for age  Also reviewed health mt list, fam hx and immunization status , as well as social and family history   See HPI Labs reviewed  amw reviewed  Good exercise  Signed up for  cologuard  No mammogram in light of mastectomy  dexa 1/19 normal range and no falls or fractures Immunized for covid Mood is good on fluoxetine        Sjogren's disease ([facility])    Stable Continues salagen      Obesity (BMI 30-39.9)    Discussed how this problem influences overall  health and the risks it imposes  Reviewed plan for weight loss with lower calorie diet (via better food choices and also portion control or program like weight watchers) and exercise building up to or more than 30 minutes 5 days per week including some aerobic activity   Enc to inc walking to 30 min daily      Vitamin B12 deficiency    Level is down a bit but still in nl range Lab Results  Component Value Date   VITAMINB12 271 02/12/2020   Enc to take 1000 mcg daily

## 2020-02-19 NOTE — Assessment & Plan Note (Signed)
Plan to continue cpap

## 2020-02-19 NOTE — Assessment & Plan Note (Signed)
Good level at 53 Vitamin D level is therapeutic with current supplementation Disc importance of this to bone and overall health Plan to continue 2000 iu daily

## 2020-02-19 NOTE — Assessment & Plan Note (Signed)
Level is down a bit but still in nl range Lab Results  Component Value Date   VITAMINB12 271 02/12/2020   Enc to take 1000 mcg daily

## 2020-02-19 NOTE — Assessment & Plan Note (Signed)
Discussed how this problem influences overall health and the risks it imposes  Reviewed plan for weight loss with lower calorie diet (via better food choices and also portion control or program like weight watchers) and exercise building up to or more than 30 minutes 5 days per week including some aerobic activity   Enc to inc walking to 30 min daily

## 2020-02-19 NOTE — Assessment & Plan Note (Signed)
Disc goals for lipids and reasons to control them Rev last labs with pt Rev low sat fat diet in detail Intol of statin  Taking zetia with fish oil Improved HDL  LDL stable in one teens

## 2020-02-19 NOTE — Patient Instructions (Addendum)
Think about increasing your walking to 30 minutes per day   We will sign you up for cologuard   Please make sure you take 1000 mcg of B12 daily   Try to get most of your carbohydrates from produce (with the exception of white potatoes)  Eat less bread/pasta/rice/snack foods/cereals/sweets and other items from the middle of the grocery store (processed carbs)  Continue gabapentin 300 mg twice daily

## 2020-02-19 NOTE — Assessment & Plan Note (Signed)
Lab Results  Component Value Date   HGBA1C 6.1 02/12/2020  disc imp of low glycemic diet and wt loss to prevent DM2

## 2020-02-19 NOTE — Assessment & Plan Note (Signed)
Stable Continues salagen

## 2020-02-19 NOTE — Assessment & Plan Note (Signed)
S/p mastectomy No re occurrence

## 2020-02-19 NOTE — Assessment & Plan Note (Signed)
.  Reviewed health habits including diet and exercise and skin cancer prevention Reviewed appropriate screening tests for age  Also reviewed health mt list, fam hx and immunization status , as well as social and family history   See HPI Labs reviewed  amw reviewed  Good exercise  Signed up for cologuard  No mammogram in light of mastectomy  dexa 1/19 normal range and no falls or fractures Immunized for covid Mood is good on fluoxetine

## 2020-04-03 ENCOUNTER — Ambulatory Visit: Payer: Medicare Other | Admitting: Adult Health

## 2020-04-14 DIAGNOSIS — Z1212 Encounter for screening for malignant neoplasm of rectum: Secondary | ICD-10-CM | POA: Diagnosis not present

## 2020-04-14 DIAGNOSIS — Z1211 Encounter for screening for malignant neoplasm of colon: Secondary | ICD-10-CM | POA: Diagnosis not present

## 2020-04-14 LAB — COLOGUARD: Cologuard: NEGATIVE

## 2020-04-16 ENCOUNTER — Ambulatory Visit (INDEPENDENT_AMBULATORY_CARE_PROVIDER_SITE_OTHER): Payer: Medicare Other | Admitting: Primary Care

## 2020-04-16 ENCOUNTER — Other Ambulatory Visit: Payer: Self-pay

## 2020-04-16 ENCOUNTER — Encounter: Payer: Self-pay | Admitting: Primary Care

## 2020-04-16 VITALS — BP 122/80 | HR 86 | Temp 97.1°F | Ht 67.5 in | Wt 251.2 lb

## 2020-04-16 DIAGNOSIS — G4733 Obstructive sleep apnea (adult) (pediatric): Secondary | ICD-10-CM

## 2020-04-16 DIAGNOSIS — M3500 Sicca syndrome, unspecified: Secondary | ICD-10-CM

## 2020-04-16 NOTE — Progress Notes (Signed)
Reviewed and agree with assessment/plan.   Chesley Mires, MD [facility] Pulmonary/Critical Care 04/16/2020, 10:19 AM Pager:  [phone]

## 2020-04-16 NOTE — Progress Notes (Signed)
'@Patient'  ID: Lauren [name], female    DOB: [birth year], 75 y.o.   MRN: [id number]  Chief Complaint  Patient presents with  . Follow-up    Patient is sleeping great, machine is working good. States she is having trouble with masks, she has tried 3 different ones. The one she has now is working good just rubs her nose raw.     Referring provider: Tower, Wynelle Fanny, MD  HPI: 75 year old female, past medical history significant for obstructive sleep apnea.  Patient of Dr. Halford Chessman, last seen in office on 02/21/2019. PSG 10/19/2016 showed severe obstructive sleep apnea, AHI 76/hr with SPO2 low 84.5%.  Maintained on auto CPAP 13-16 cm H2O.  Follows with rheumatology for Sjogren's syndrome.  04/16/2020 Patient presents today for 1 year follow-up OSA. She is doing well, no acute complaints. No issues with pressure setting. She is current using a large soft full face mask. She has a travel machine to use if needed. Her daughter just moved back from [place]. She is sleeping well at night, wakes up once to use restroom. Feels rested in the morning. Taking Gabapentin 326m twice daily which has helped tremendously with arthritic pain. 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 Denies f/c/w, chest pain, chest tightness, shortness of breath or cough.   Airview download 03/16/2020-04/14/2020 30/30 days (100%) > 4 hours Average usage 9 hours 40 minutes Pressure 13-16 cm H2O (15.7cm h20- 95%) Air leaks 18.8L/min (95%) AHI 1.0  Allergies  Allergen Reactions  . Bee Venom   . Statins     REACTION: joint pain    Immunization History  Administered Date(s) Administered  . HPV Bivalent 11/10/2016  . Influenza Split 11/26/2010  . Influenza Whole 12/06/2009  . Influenza, High Dose Seasonal PF 12/31/2019  . Influenza, Seasonal, Injecte, Preservative Fre 10/23/2015  . Influenza,inj,Quad PF,6+ Mos 11/26/2013, 11/10/2014, 11/10/2016, 11/22/2017, 10/30/2018  . Influenza-Unspecified 11/26/2013, 11/10/2014, 11/10/2016, 11/22/2017, 10/30/2018  .  PFIZER(Purple Top)SARS-COV-2 Vaccination 02/28/2019, 03/21/2019, 12/27/2019  . Pneumococcal Conjugate-13 12/24/2014, 05/31/2016  . Pneumococcal Polysaccharide-23 01/19/2010, 01/08/2018  . Td 10/13/2006  . Zoster 02/07/2005  . Zoster Recombinat (Shingrix) 05/31/2016, 08/23/2016    Past Medical History:  Diagnosis Date  . Breast cancer ([facility]    DCIS; BRCA 1 pos HER2 pos, ER2 pos  . DDD (degenerative disc disease)   . GERD (gastroesophageal reflux disease)   . HLD (hyperlipidemia)    statin intolerant  . Hyperglycemia 9/09   A1C elvated to 6.2  . Osteoarthritis   . Osteopenia 2007   ? from xray with nl dexa  . Reactive depression (situational)    very well controlled, and now takes prozac for sleep  . S[place]'s syndrome ([place]   . Urine incontinence   . Vitamin D deficiency     Tobacco History: Social History   Tobacco Use  Smoking Status Never Smoker  Smokeless Tobacco Never Used   Counseling given: Not Answered   Outpatient Medications Prior to Visit  Medication Sig Dispense Refill  . celecoxib (CELEBREX) 200 MG capsule Take 1 capsule (200 mg total) by mouth daily. With food 90 capsule 3  . cholecalciferol (VITAMIN D) 1000 units tablet Take 1,000 Units by mouth daily.    .Marland KitchenEPINEPHrine 0.3 mg/0.3 mL IJ SOAJ injection Inject 0.3 mLs (0.3 mg total) into the muscle once. 1 Device 5  . ezetimibe (ZETIA) 10 MG tablet Take 1 tablet (10 mg total) by mouth daily. 90 tablet 3  . Fexofenadine HCl (ALLEGRA PO) Take 1 tablet by mouth daily.    .Marland Kitchen  Fish Oil-Cholecalciferol (OMEGA-3 + VITAMIN D3 PO) Take 2 capsules by mouth daily.     Marland Kitchen FLUoxetine (PROZAC) 20 MG capsule Take 1 capsule (20 mg total) by mouth daily. 90 capsule 3  . gabapentin (NEURONTIN) 300 MG capsule Take 1 capsule (300 mg total) by mouth 2 (two) times daily. 180 capsule 3  . omeprazole (PRILOSEC) 20 MG capsule Take 20 mg by mouth daily.    Marland Kitchen oxybutynin (OXYTROL) 3.9 MG/24HR Place 1 patch onto the skin 2 (two) times a  week. 24 patch 3  . pilocarpine (SALAGEN) 5 MG tablet Take 1 tablet (5 mg total) by mouth 3 (three) times daily. 270 tablet 3  . methocarbamol (ROBAXIN) 500 MG tablet Take 1 tablet (500 mg total) by mouth 2 (two) times daily as needed for muscle spasms (sedation precautions). 40 tablet 0  . valACYclovir (VALTREX) 500 MG tablet TAKE 1 TABLET BY MOUTH TWICE DAILY AS NEEDED FOR OUTBREAK/RASH 14 tablet 3   No facility-administered medications prior to visit.   Review of Systems  Review of Systems  Constitutional: Negative.   HENT: Negative.   Respiratory: Negative.   Cardiovascular: Negative.   Musculoskeletal: Negative for arthralgias.  Psychiatric/Behavioral: Negative for sleep disturbance.   Physical Exam  BP 122/80 (BP Location: Right Arm, Patient Position: Sitting, Cuff Size: Large)   Pulse 86   Temp (!) 97.1 F (36.2 C) (Temporal)   Ht 5' 7.5" (1.715 m)   Wt 251 lb 3.2 oz (113.9 kg)   SpO2 98%   BMI 38.76 kg/m  Physical Exam Constitutional:      Appearance: Normal appearance.  HENT:     Head: Normocephalic and atraumatic.     Mouth/Throat:     Comments: Deferred d/t masking Cardiovascular:     Rate and Rhythm: Normal rate.     Comments: Regularly irregular  Pulmonary:     Effort: Pulmonary effort is normal.     Breath sounds: Normal breath sounds. No wheezing, rhonchi or rales.  Musculoskeletal:        General: Normal range of motion.  Skin:    General: Skin is warm and dry.  Neurological:     General: No focal deficit present.     Mental Status: She is alert and oriented to person, place, and time. Mental status is at baseline.  Psychiatric:        Mood and Affect: Mood normal.        Behavior: Behavior normal.        Thought Content: Thought content normal.        Judgment: Judgment normal.      Lab Results:  CBC    Component Value Date/Time   WBC 9.3 02/12/2020 0805   RBC 5.30 (H) 02/12/2020 0805   HGB 14.9 02/12/2020 0805   HGB 14.8 09/11/2019  1331   HGB 14.7 10/31/2012 0821   HCT 45.8 02/12/2020 0805   HCT 44.8 09/11/2019 1331   HCT 43.8 10/31/2012 0821   PLT 188.0 02/12/2020 0805   PLT 264 09/11/2019 1331   MCV 86.4 02/12/2020 0805   MCV 88 09/11/2019 1331   MCV 87.1 10/31/2012 0821   MCH 28.9 09/11/2019 1331   MCH 29.2 10/31/2012 0821   MCH 28.9 11/09/2007 1054   MCHC 32.5 02/12/2020 0805   RDW 15.6 (H) 02/12/2020 0805   RDW 13.5 09/11/2019 1331   RDW 14.2 10/31/2012 0821   LYMPHSABS 1.9 02/12/2020 0805   LYMPHSABS 1.3 10/31/2012 0821   MONOABS 0.7 02/12/2020 0805  MONOABS 0.6 10/31/2012 0821   EOSABS 0.3 02/12/2020 0805   EOSABS 0.2 10/31/2012 0821   EOSABS 0.2 11/09/2007 1054   BASOSABS 0.1 02/12/2020 0805   BASOSABS 0.0 10/31/2012 0821    BMET    Component Value Date/Time   NA 137 02/12/2020 0805   NA 139 09/11/2019 1331   NA 140 10/31/2012 0821   K 4.1 02/12/2020 0805   K 4.3 10/31/2012 0821   CL 101 02/12/2020 0805   CL 103 10/26/2011 0957   CO2 28 02/12/2020 0805   CO2 22 10/31/2012 0821   GLUCOSE 97 02/12/2020 0805   GLUCOSE 89 10/31/2012 0821   GLUCOSE 103 (H) 10/26/2011 0957   BUN 17 02/12/2020 0805   BUN 11 09/11/2019 1331   BUN 12.1 10/31/2012 0821   CREATININE 0.62 02/12/2020 0805   CREATININE 0.7 10/31/2012 0821   CALCIUM 9.1 02/12/2020 0805   CALCIUM 9.6 10/31/2012 0821   GFRNONAA 86 09/11/2019 1331   GFRAA 99 09/11/2019 1331    BNP No results found for: BNP  ProBNP No results found for: PROBNP  Imaging: No results found.   Assessment & Plan:   OSA (obstructive sleep apnea) - Patient is 100% compliant with CPAP and reports benefit from use - Pressure 13-16 cm H2O; residual AHI 1.0 - No changes today, renew CPAP supplies with [employer]  - FU in 1 year with Dr. Halford Chessman or APP   Sjogren's disease [facility]) - Following with Rheumatology    Martyn Ehrich, NP 04/16/2020

## 2020-04-16 NOTE — Assessment & Plan Note (Signed)
Following with Rheumatology

## 2020-04-16 NOTE — Patient Instructions (Signed)
Pleasure meeting you today Lauren [name]  Recommendations: Continue to wear CPAP every night for 4-6 hours or more Do not drive if experiencing excessive daytime fatigue or somnolence No changes to current pressure setting RENEW supplies with DME company/[employer]   Follow-up: 1 year with DR. Sood or APP   CPAP and BPAP Information CPAP and BPAP are methods that use air pressure to keep your airways open and to help you breathe well. CPAP and BPAP use different amounts of pressure. Your health care provider will tell you whether CPAP or BPAP would be more helpful for you.  CPAP stands for "continuous positive airway pressure." With CPAP, the amount of pressure stays the same while you breathe in and out.  BPAP stands for "bi-level positive airway pressure." With BPAP, the amount of pressure will be higher when you breathe in (inhale) and lower when you breathe out(exhale). This allows you to take larger breaths. CPAP or BPAP may be used in the hospital, or your health care provider may want you to use it at home. You may need to have a sleep study before your health care provider can order a machine for you to use at home. Why are CPAP and BPAP treatments used? CPAP or BPAP can be helpful if you have:  Sleep apnea.  Chronic obstructive pulmonary disease (COPD).  Heart failure.  Medical conditions that cause muscle weakness, including muscular dystrophy or amyotrophic lateral sclerosis (ALS).  Other problems that cause breathing to be shallow, weak, abnormal, or difficult. CPAP and BPAP are most commonly used for obstructive sleep apnea (OSA) to keep the airways from collapsing when the muscles relax during sleep. How is CPAP or BPAP administered? Both CPAP and BPAP are provided by a small machine with a flexible plastic tube that attaches to a plastic mask that you wear. Air is blown through the mask into your nose or mouth. The amount of pressure that is used to blow the air can be adjusted  on the machine. Your health care provider will set the pressure setting and help you find the best mask for you. When should CPAP or BPAP be used? In most cases, the mask only needs to be worn during sleep. Generally, the mask needs to be worn throughout the night and during any daytime naps. People with certain medical conditions may also need to wear the mask at other times when they are awake. Follow instructions from your health care provider about when to use the machine. What are some tips for using the mask?  Because the mask needs to be snug, some people feel trapped or closed-in (claustrophobic) when first using the mask. If you feel this way, you may need to get used to the mask. One way to do this is to hold the mask loosely over your nose or mouth and then gradually apply the mask more snugly. You can also gradually increase the amount of time that you use the mask.  Masks are available in various types and sizes. If your mask does not fit well, talk with your health care provider about getting a different one. Some common types of masks include: ? Full face masks, which fit over the mouth and nose. ? Nasal masks, which fit over the nose. ? Nasal pillow or prong masks, which fit into the nostrils.  If you are using a mask that fits over your nose and you tend to breathe through your mouth, a chin strap may be applied to help keep your  mouth closed.  Some CPAP and BPAP machines have alarms that may sound if the mask comes off or develops a leak.  If you have trouble with the mask, it is very important that you talk with your health care provider about finding a way to make the mask easier to tolerate. Do not stop using the mask. There could be a negative impact to your health if you stop using the mask.   What are some tips for using the machine?  Place your CPAP or BPAP machine on a secure table or stand near an electrical outlet.  Know where the on/off switch is on the  machine.  Follow instructions from your health care provider about how to set the pressure on your machine and when you should use it.  Do not eat or drink while the CPAP or BPAP machine is on. Food or fluids could get pushed into your lungs by the pressure of the CPAP or BPAP.  For home use, CPAP and BPAP machines can be rented or purchased through home health care companies. Many different brands of machines are available. Renting a machine before purchasing may help you find out which particular machine works well for you. Your insurance may also decide which machine you may get.  Keep the CPAP or BPAP machine and attachments clean. Ask your health care provider for specific instructions. Follow these instructions at home:  Do not use any products that contain nicotine or tobacco, such as cigarettes, e-cigarettes, and chewing tobacco. If you need help quitting, ask your health care provider.  Keep all follow-up visits as told by your health care provider. This is important. Contact a health care provider if:  You have redness or pressure sores on your head, face, mouth, or nose from the mask or head gear.  You have trouble using the CPAP or BPAP machine.  You cannot tolerate wearing the CPAP or BPAP mask.  Someone tells you that you snore even when wearing your CPAP or BPAP. Get help right away if:  You have trouble breathing.  You feel confused. Summary  CPAP and BPAP are methods that use air pressure to keep your airways open and to help you breathe well.  You may need to have a sleep study before your health care provider can order a machine for home use.  If you have trouble with the mask, it is very important that you talk with your health care provider about finding a way to make the mask easier to tolerate. Do not stop using the mask. There could be a negative impact to your health if you stop using the mask.  Follow instructions from your health care provider about when  to use the machine. This information is not intended to replace advice given to you by your health care provider. Make sure you discuss any questions you have with your health care provider. Document Revised: 02/15/2019 Document Reviewed: 02/18/2019 Elsevier Patient Education  2021 [employer].

## 2020-04-16 NOTE — Assessment & Plan Note (Addendum)
-   Patient is 100% compliant with CPAP and reports benefit from use - Pressure 13-16 cm H2O; residual AHI 1.0 - No changes today, renew CPAP supplies with [employer]  - FU in 1 year with Dr. Halford Chessman or APP

## 2020-04-22 LAB — COLOGUARD: Cologuard: NEGATIVE

## 2020-05-01 ENCOUNTER — Encounter: Payer: Self-pay | Admitting: Family Medicine

## 2020-05-05 DIAGNOSIS — Z85828 Personal history of other malignant neoplasm of skin: Secondary | ICD-10-CM | POA: Diagnosis not present

## 2020-05-05 DIAGNOSIS — Z08 Encounter for follow-up examination after completed treatment for malignant neoplasm: Secondary | ICD-10-CM | POA: Diagnosis not present

## 2020-06-07 DIAGNOSIS — I639 Cerebral infarction, unspecified: Secondary | ICD-10-CM

## 2020-06-07 HISTORY — DX: Cerebral infarction, unspecified: I63.9

## 2020-06-13 ENCOUNTER — Inpatient Hospital Stay (HOSPITAL_COMMUNITY): Payer: Medicare Other

## 2020-06-13 ENCOUNTER — Emergency Department (HOSPITAL_COMMUNITY): Payer: Medicare Other

## 2020-06-13 ENCOUNTER — Encounter (HOSPITAL_COMMUNITY): Payer: Self-pay | Admitting: Internal Medicine

## 2020-06-13 ENCOUNTER — Inpatient Hospital Stay (HOSPITAL_COMMUNITY)
Admission: EM | Admit: 2020-06-13 | Discharge: 2020-06-15 | DRG: 065 | Disposition: A | Discharge status: Home Health | Payer: Medicare Other | Attending: Internal Medicine | Admitting: Internal Medicine

## 2020-06-13 ENCOUNTER — Other Ambulatory Visit: Payer: Self-pay

## 2020-06-13 DIAGNOSIS — I4819 Other persistent atrial fibrillation: Secondary | ICD-10-CM | POA: Diagnosis present

## 2020-06-13 DIAGNOSIS — E559 Vitamin D deficiency, unspecified: Secondary | ICD-10-CM | POA: Diagnosis present

## 2020-06-13 DIAGNOSIS — R7303 Prediabetes: Secondary | ICD-10-CM | POA: Diagnosis present

## 2020-06-13 DIAGNOSIS — Z808 Family history of malignant neoplasm of other organs or systems: Secondary | ICD-10-CM

## 2020-06-13 DIAGNOSIS — I4892 Unspecified atrial flutter: Secondary | ICD-10-CM | POA: Diagnosis not present

## 2020-06-13 DIAGNOSIS — I6389 Other cerebral infarction: Secondary | ICD-10-CM | POA: Diagnosis not present

## 2020-06-13 DIAGNOSIS — R4701 Aphasia: Secondary | ICD-10-CM | POA: Diagnosis not present

## 2020-06-13 DIAGNOSIS — Z66 Do not resuscitate: Secondary | ICD-10-CM | POA: Diagnosis present

## 2020-06-13 DIAGNOSIS — E669 Obesity, unspecified: Secondary | ICD-10-CM | POA: Diagnosis present

## 2020-06-13 DIAGNOSIS — R29703 NIHSS score 3: Secondary | ICD-10-CM | POA: Diagnosis present

## 2020-06-13 DIAGNOSIS — I639 Cerebral infarction, unspecified: Secondary | ICD-10-CM | POA: Diagnosis not present

## 2020-06-13 DIAGNOSIS — Z96653 Presence of artificial knee joint, bilateral: Secondary | ICD-10-CM | POA: Diagnosis present

## 2020-06-13 DIAGNOSIS — I10 Essential (primary) hypertension: Secondary | ICD-10-CM | POA: Diagnosis not present

## 2020-06-13 DIAGNOSIS — E7439 Other disorders of intestinal carbohydrate absorption: Secondary | ICD-10-CM | POA: Diagnosis present

## 2020-06-13 DIAGNOSIS — R471 Dysarthria and anarthria: Secondary | ICD-10-CM | POA: Diagnosis present

## 2020-06-13 DIAGNOSIS — M3500 Sicca syndrome, unspecified: Secondary | ICD-10-CM | POA: Diagnosis present

## 2020-06-13 DIAGNOSIS — R29810 Facial weakness: Secondary | ICD-10-CM | POA: Diagnosis not present

## 2020-06-13 DIAGNOSIS — Z803 Family history of malignant neoplasm of breast: Secondary | ICD-10-CM

## 2020-06-13 DIAGNOSIS — Z6839 Body mass index (BMI) 39.0-39.9, adult: Secondary | ICD-10-CM

## 2020-06-13 DIAGNOSIS — F4321 Adjustment disorder with depressed mood: Secondary | ICD-10-CM | POA: Diagnosis present

## 2020-06-13 DIAGNOSIS — E785 Hyperlipidemia, unspecified: Secondary | ICD-10-CM | POA: Diagnosis present

## 2020-06-13 DIAGNOSIS — R531 Weakness: Secondary | ICD-10-CM | POA: Diagnosis present

## 2020-06-13 DIAGNOSIS — Z20822 Contact with and (suspected) exposure to covid-19: Secondary | ICD-10-CM | POA: Diagnosis present

## 2020-06-13 DIAGNOSIS — M1990 Unspecified osteoarthritis, unspecified site: Secondary | ICD-10-CM | POA: Diagnosis present

## 2020-06-13 DIAGNOSIS — Z9013 Acquired absence of bilateral breasts and nipples: Secondary | ICD-10-CM

## 2020-06-13 DIAGNOSIS — R4702 Dysphasia: Secondary | ICD-10-CM | POA: Diagnosis not present

## 2020-06-13 DIAGNOSIS — I6340 Cerebral infarction due to embolism of unspecified cerebral artery: Principal | ICD-10-CM | POA: Diagnosis present

## 2020-06-13 DIAGNOSIS — R270 Ataxia, unspecified: Secondary | ICD-10-CM | POA: Diagnosis present

## 2020-06-13 DIAGNOSIS — G4733 Obstructive sleep apnea (adult) (pediatric): Secondary | ICD-10-CM | POA: Diagnosis present

## 2020-06-13 DIAGNOSIS — E7800 Pure hypercholesterolemia, unspecified: Secondary | ICD-10-CM | POA: Diagnosis not present

## 2020-06-13 DIAGNOSIS — M8580 Other specified disorders of bone density and structure, unspecified site: Secondary | ICD-10-CM | POA: Diagnosis present

## 2020-06-13 DIAGNOSIS — Z8041 Family history of malignant neoplasm of ovary: Secondary | ICD-10-CM

## 2020-06-13 DIAGNOSIS — Z8673 Personal history of transient ischemic attack (TIA), and cerebral infarction without residual deficits: Secondary | ICD-10-CM | POA: Diagnosis present

## 2020-06-13 DIAGNOSIS — Z833 Family history of diabetes mellitus: Secondary | ICD-10-CM

## 2020-06-13 DIAGNOSIS — Z79899 Other long term (current) drug therapy: Secondary | ICD-10-CM

## 2020-06-13 DIAGNOSIS — Z791 Long term (current) use of non-steroidal anti-inflammatories (NSAID): Secondary | ICD-10-CM

## 2020-06-13 DIAGNOSIS — Z8249 Family history of ischemic heart disease and other diseases of the circulatory system: Secondary | ICD-10-CM

## 2020-06-13 DIAGNOSIS — R0902 Hypoxemia: Secondary | ICD-10-CM | POA: Diagnosis not present

## 2020-06-13 DIAGNOSIS — K219 Gastro-esophageal reflux disease without esophagitis: Secondary | ICD-10-CM | POA: Diagnosis present

## 2020-06-13 DIAGNOSIS — Z96649 Presence of unspecified artificial hip joint: Secondary | ICD-10-CM | POA: Diagnosis present

## 2020-06-13 DIAGNOSIS — Z853 Personal history of malignant neoplasm of breast: Secondary | ICD-10-CM

## 2020-06-13 DIAGNOSIS — Z888 Allergy status to other drugs, medicaments and biological substances status: Secondary | ICD-10-CM

## 2020-06-13 DIAGNOSIS — Z823 Family history of stroke: Secondary | ICD-10-CM

## 2020-06-13 DIAGNOSIS — G459 Transient cerebral ischemic attack, unspecified: Secondary | ICD-10-CM | POA: Diagnosis not present

## 2020-06-13 HISTORY — DX: Do not resuscitate: Z66

## 2020-06-13 LAB — I-STAT CHEM 8, ED
BUN: 12 mg/dL (ref 8–23)
Calcium, Ion: 1.08 mmol/L — ABNORMAL LOW (ref 1.15–1.40)
Chloride: 106 mmol/L (ref 98–111)
Creatinine, Ser: 0.6 mg/dL (ref 0.44–1.00)
Glucose, Bld: 140 mg/dL — ABNORMAL HIGH (ref 70–99)
HCT: 42 % (ref 36.0–46.0)
Hemoglobin: 14.3 g/dL (ref 12.0–15.0)
Potassium: 3.8 mmol/L (ref 3.5–5.1)
Sodium: 141 mmol/L (ref 135–145)
TCO2: 26 mmol/L (ref 22–32)

## 2020-06-13 LAB — CBC
HCT: 43.4 % (ref 36.0–46.0)
Hemoglobin: 13.9 g/dL (ref 12.0–15.0)
MCH: 28.3 pg (ref 26.0–34.0)
MCHC: 32 g/dL (ref 30.0–36.0)
MCV: 88.4 fL (ref 80.0–100.0)
Platelets: 194 10*3/uL (ref 150–400)
RBC: 4.91 MIL/uL (ref 3.87–5.11)
RDW: 14.8 % (ref 11.5–15.5)
WBC: 10.6 10*3/uL — ABNORMAL HIGH (ref 4.0–10.5)
nRBC: 0 % (ref 0.0–0.2)

## 2020-06-13 LAB — DIFFERENTIAL
Abs Immature Granulocytes: 0.03 10*3/uL (ref 0.00–0.07)
Basophils Absolute: 0.1 10*3/uL (ref 0.0–0.1)
Basophils Relative: 1 %
Eosinophils Absolute: 0.3 10*3/uL (ref 0.0–0.5)
Eosinophils Relative: 3 %
Immature Granulocytes: 0 %
Lymphocytes Relative: 16 %
Lymphs Abs: 1.7 10*3/uL (ref 0.7–4.0)
Monocytes Absolute: 0.8 10*3/uL (ref 0.1–1.0)
Monocytes Relative: 7 %
Neutro Abs: 7.8 10*3/uL — ABNORMAL HIGH (ref 1.7–7.7)
Neutrophils Relative %: 73 %

## 2020-06-13 LAB — COMPREHENSIVE METABOLIC PANEL
ALT: 16 U/L (ref 0–44)
AST: 20 U/L (ref 15–41)
Albumin: 3.6 g/dL (ref 3.5–5.0)
Alkaline Phosphatase: 68 U/L (ref 38–126)
Anion gap: 8 (ref 5–15)
BUN: 11 mg/dL (ref 8–23)
CO2: 24 mmol/L (ref 22–32)
Calcium: 8.7 mg/dL — ABNORMAL LOW (ref 8.9–10.3)
Chloride: 106 mmol/L (ref 98–111)
Creatinine, Ser: 0.73 mg/dL (ref 0.44–1.00)
GFR, Estimated: >60 mL/min (ref >60)
Glucose, Bld: 141 mg/dL — ABNORMAL HIGH (ref 70–99)
Potassium: 3.7 mmol/L (ref 3.5–5.1)
Sodium: 138 mmol/L (ref 135–145)
Total Bilirubin: 0.5 mg/dL (ref 0.3–1.2)
Total Protein: 6.7 g/dL (ref 6.5–8.1)

## 2020-06-13 LAB — CBG MONITORING, ED: Glucose-Capillary: 126 mg/dL — ABNORMAL HIGH (ref 70–99)

## 2020-06-13 LAB — SARS CORONAVIRUS 2 (TAT 6-24 HRS): SARS Coronavirus 2: NEGATIVE

## 2020-06-13 LAB — GLUCOSE, CAPILLARY: Glucose-Capillary: 120 mg/dL — ABNORMAL HIGH (ref 70–99)

## 2020-06-13 LAB — PROTIME-INR
INR: 1 (ref 0.8–1.2)
Prothrombin Time: 13.3 seconds (ref 11.4–15.2)

## 2020-06-13 LAB — APTT: aPTT: 26 seconds (ref 24–36)

## 2020-06-13 IMAGING — MR MR MRA HEAD W/O CM
1 series · 19 of 48 positions shown · non-contrast
Comparison: None.

CLINICAL DATA: Stroke follow-up. Expressive aphasia and dysarthria.

EXAM:
MRI HEAD WITHOUT CONTRAST
MRA HEAD WITHOUT CONTRAST
TECHNIQUE: Multiplanar, multiecho pulse sequences of the brain and surrounding
structures were obtained without intravenous contrast. Angiographic
images of the head were obtained using MRA technique without
contrast.

[Series 5: 3d cow · axial · 0.5mm · 0.41mm/px · z∈[-84,+13]mm · 19 of 204 slices shown]
[im 1/204]
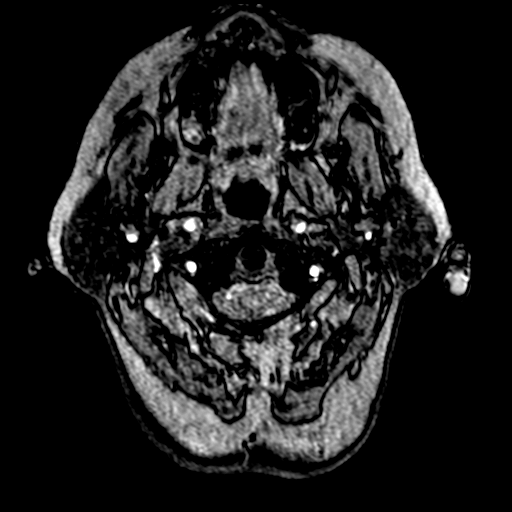
[im 5/204]
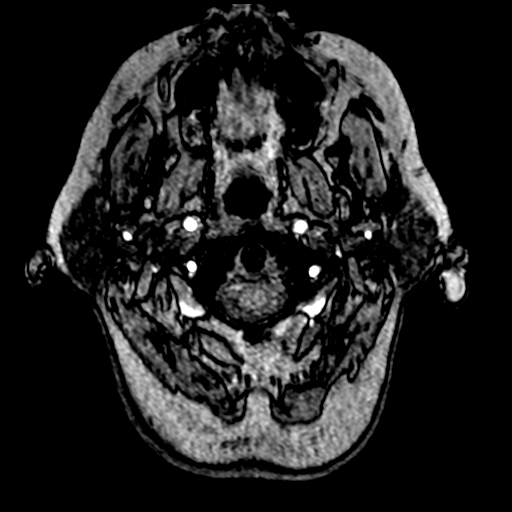
[im 9/204]
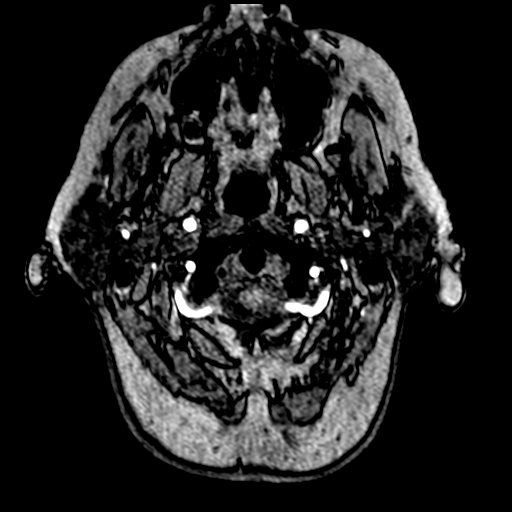
[im 13/204]
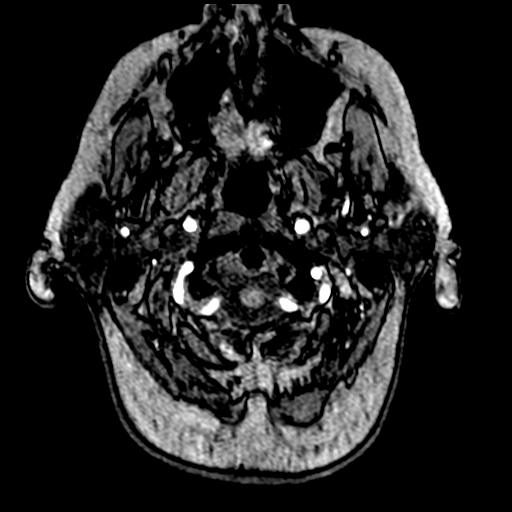
[im 18/204]
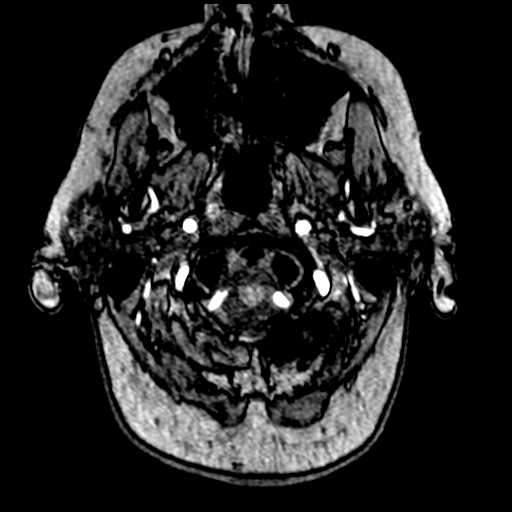
[im 22/204]
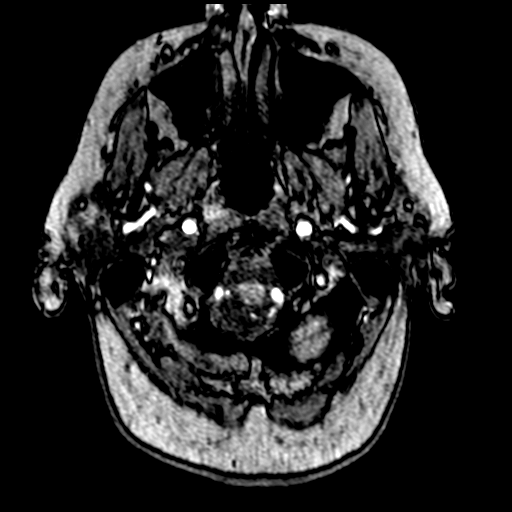
[im 26/204]
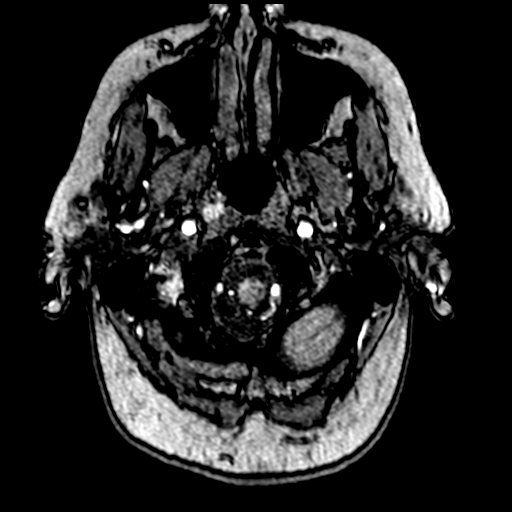
[im 31/204]
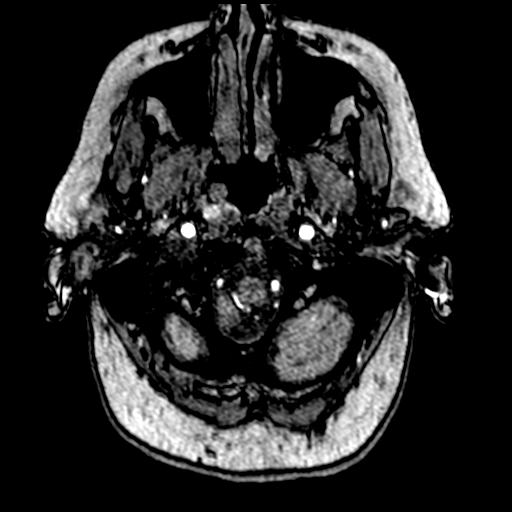
[im 35/204]
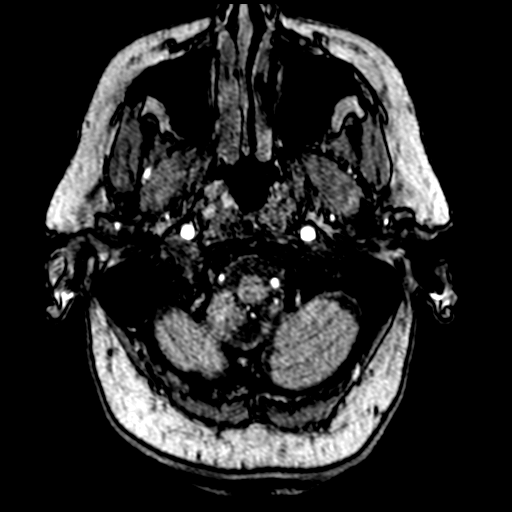
[im 39/204]
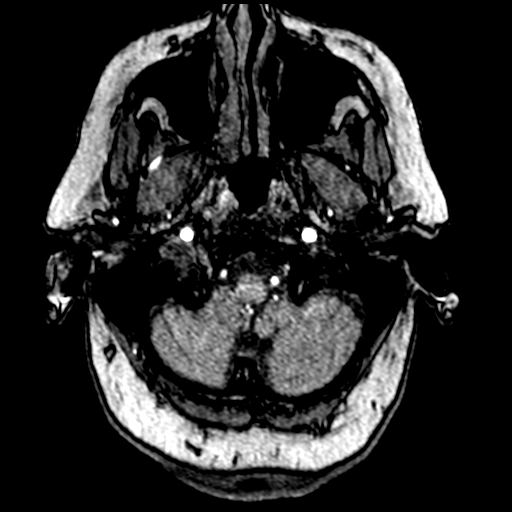
[im 44/204]
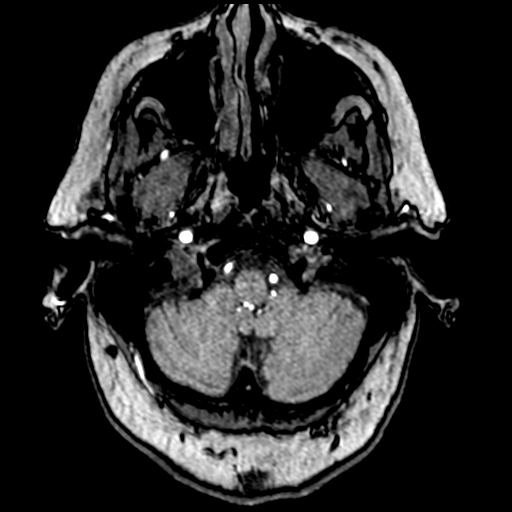
[im 65/204]
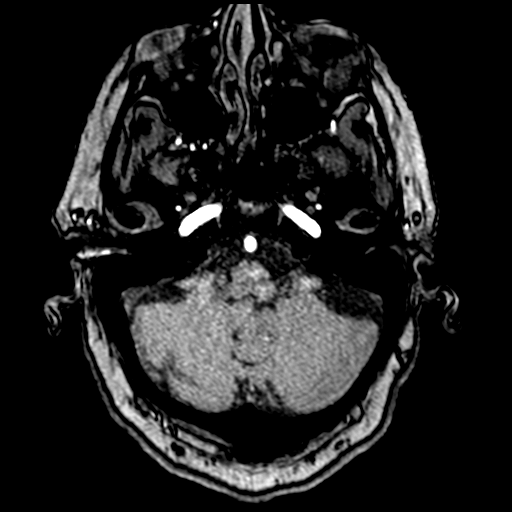
[im 91/204]
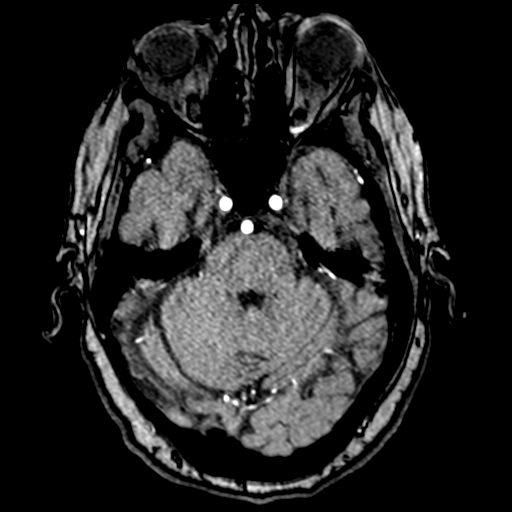
[im 104/204]
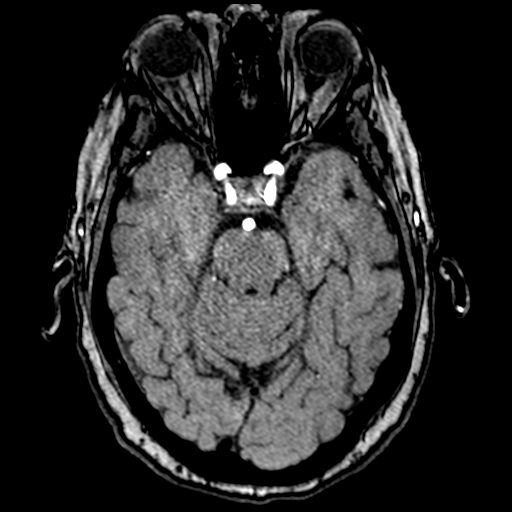
[im 117/204]
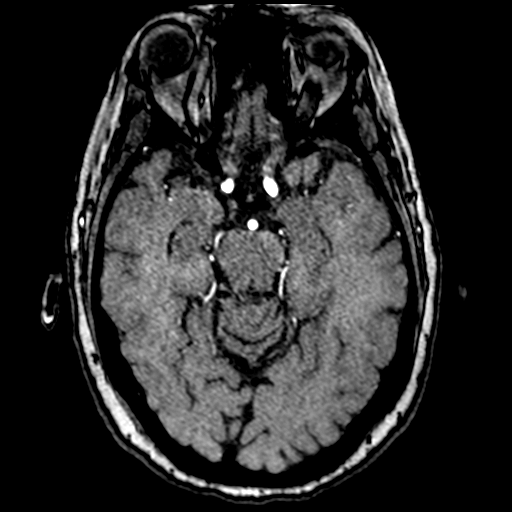
[im 143/204]
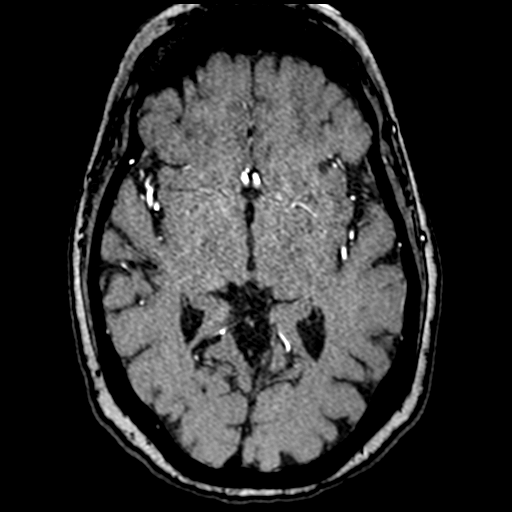
[im 169/204]
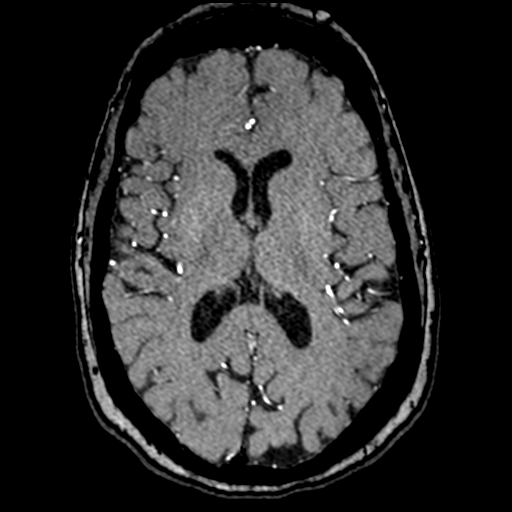
[im 173/204]
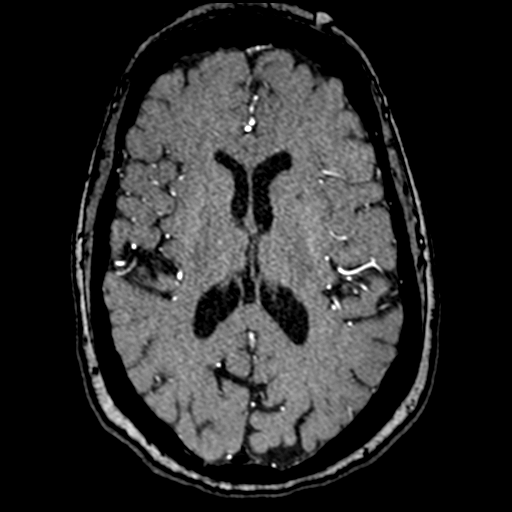
[im 195/204]
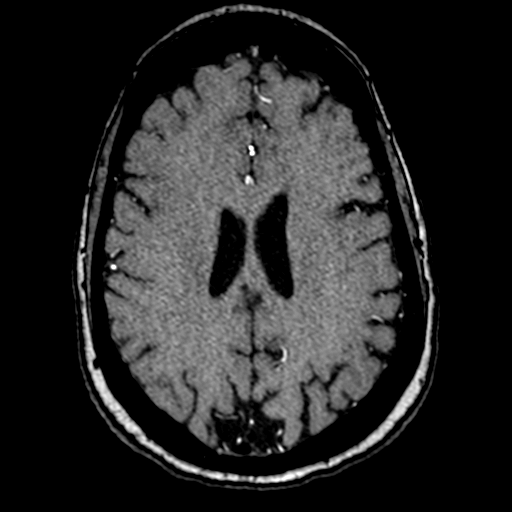

[19 of 48 positions shown; findings below may reference images not displayed]

FINDINGS: MRI HEAD FINDINGS

Brain: Abnormal diffusion restriction within the left corona radiata
and subcortical left parietal lobe. No acute or chronic hemorrhage.
There is multifocal hyperintense T2-weighted signal within the white
matter. Parenchymal volume and CSF spaces are normal. The midline
structures are normal.

Vascular: Major flow voids are preserved.

Skull and upper cervical spine: Normal calvarium and skull base.
Visualized upper cervical spine and soft tissues are normal.

Sinuses/Orbits:No paranasal sinus fluid levels or advanced mucosal
thickening. No mastoid or middle ear effusion. Normal orbits.

MRA HEAD FINDINGS

POSTERIOR CIRCULATION:

--Vertebral arteries: Normal

--Inferior cerebellar arteries: Normal.

--Basilar artery: Normal.

--Superior cerebellar arteries: Normal.

--Posterior cerebral arteries: Normal. The right PCA is
predominantly supplied by the posterior communicating artery.

ANTERIOR CIRCULATION:

--Intracranial internal carotid arteries: Normal.

--Anterior cerebral arteries (ACA): Normal.

--Middle cerebral arteries (MCA): Normal.

ANATOMIC VARIANTS: None
IMPRESSION: 1. Acute infarct of the left corona radiata and subcortical left
parietal lobe. No hemorrhage or mass effect.
2. Normal intracranial MRA.

## 2020-06-13 IMAGING — CT CT HEAD CODE STROKE
4 series · 16 of 47 positions shown, 18 images · non-contrast
Comparison: None.

CLINICAL DATA: Code stroke. Neuro deficit, acute, stroke suspected.
Acute onset of left facial droop. Last seen normal at 11 p.m. last
night.

EXAM:
CT HEAD WITHOUT CONTRAST
TECHNIQUE: Contiguous axial images were obtained from the base of the skull
through the vertex without intravenous contrast.

[Series 3: head wo · axial · 0.45mm/px · z∈[-150,-30]mm · 7 of 32 slices shown, 9 images]
[im 4/32  brain]
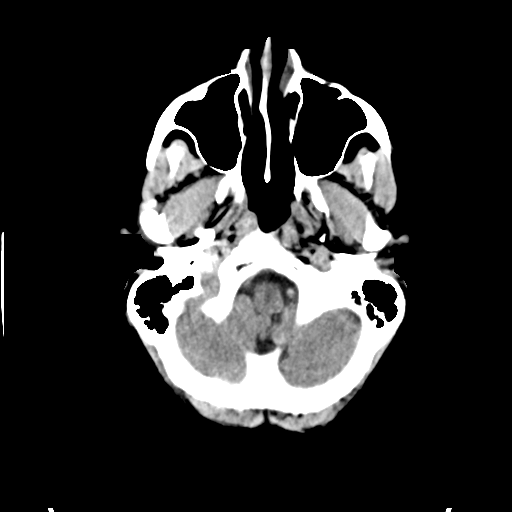
[im 4/32  bone]
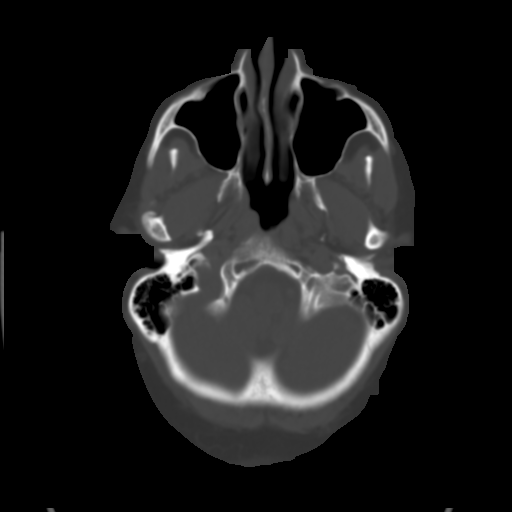
[im 8/32  brain]
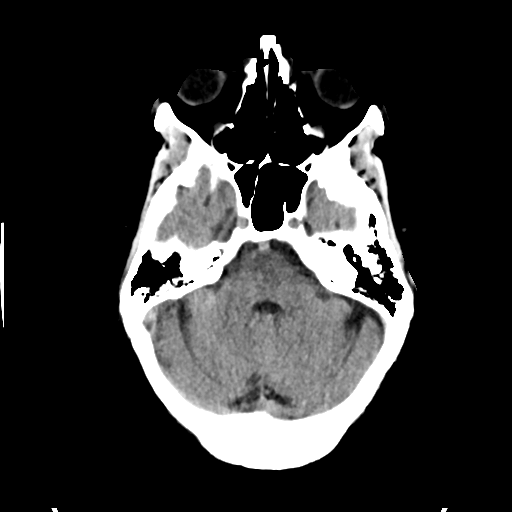
[im 12/32  brain]
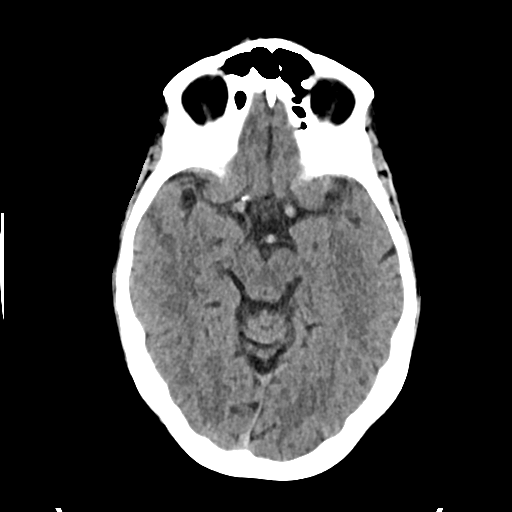
[im 16/32  brain]
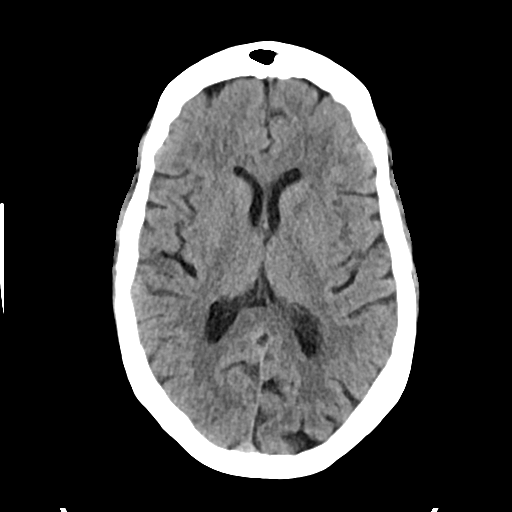
[im 20/32  brain]
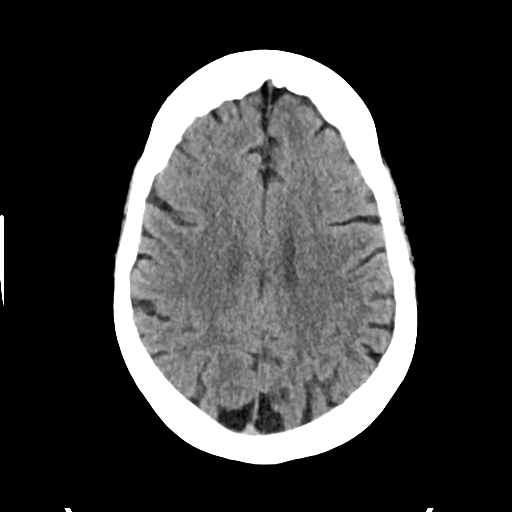
[im 20/32  bone]
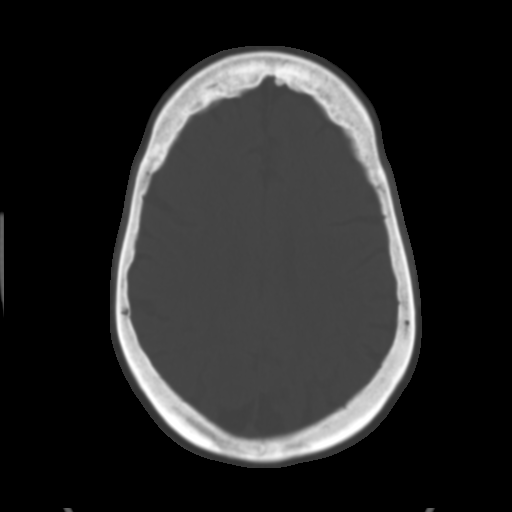
[im 24/32  brain]
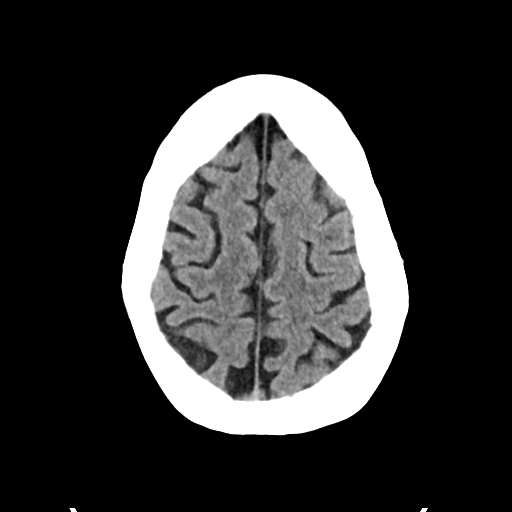
[im 28/32  brain]
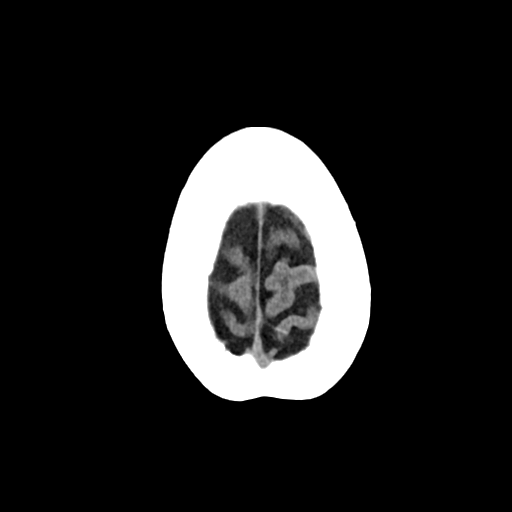

[Series 4: head bone · axial · 0.45mm/px · z∈[-152,-120]mm · 3 of 79 slices shown]
[im 8/79  bone]
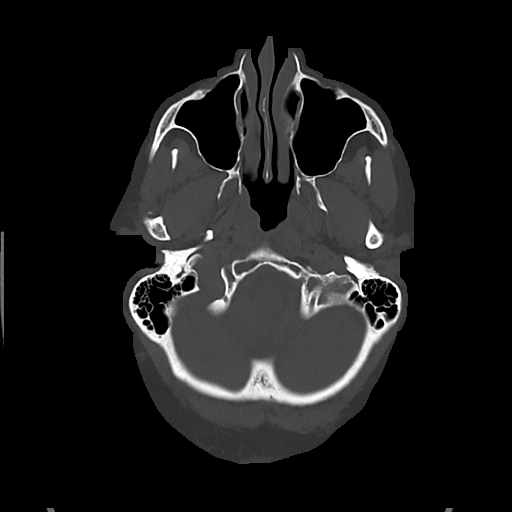
[im 16/79  bone]
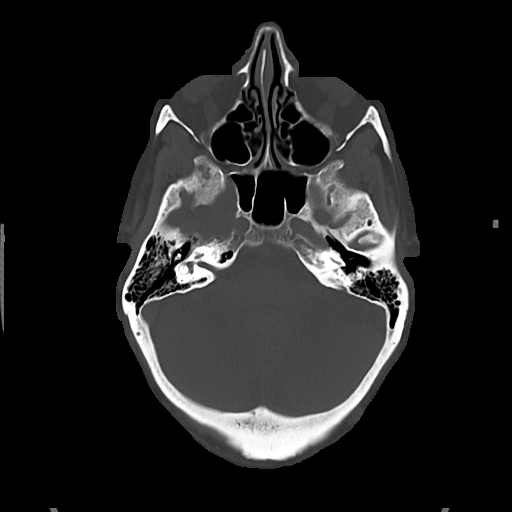
[im 24/79  bone]
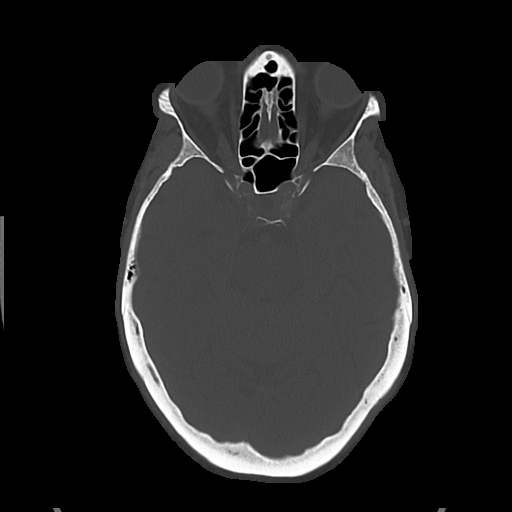

[Series 5: cor soft · coronal · 0.34mm/px · 3 of 74 slices shown]
[im 25/74  brain]
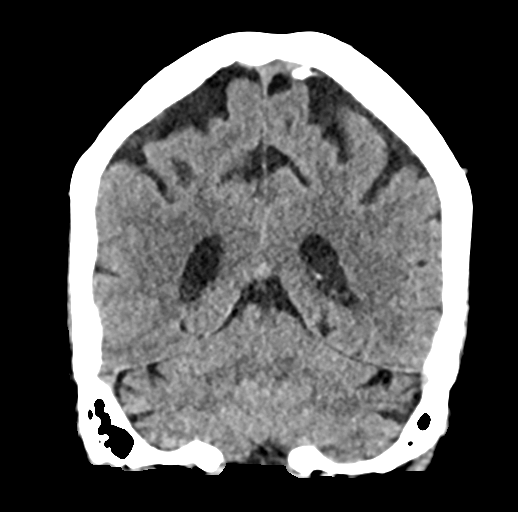
[im 33/74  brain]
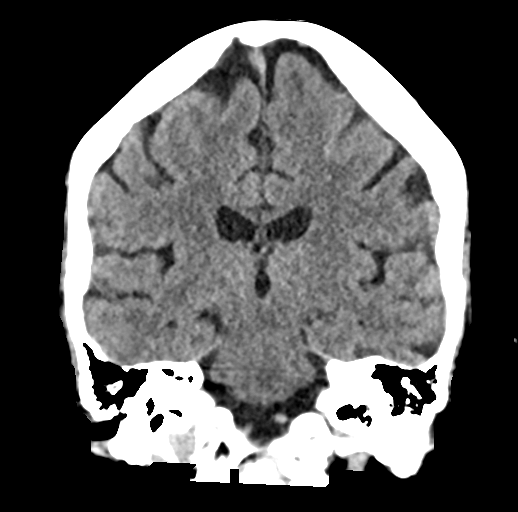
[im 41/74  brain]
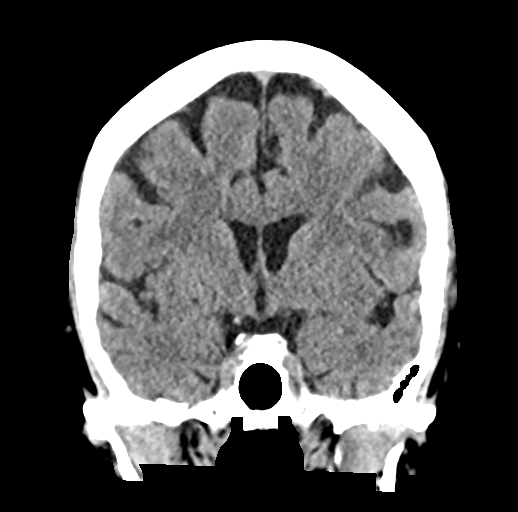

[Series 6: sag soft · sagittal · 0.34mm/px · 3 of 59 slices shown]
[im 20/59  brain]
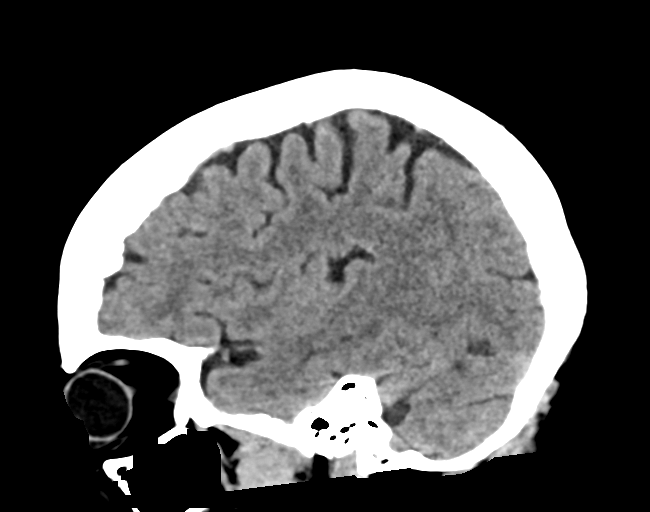
[im 30/59  brain]
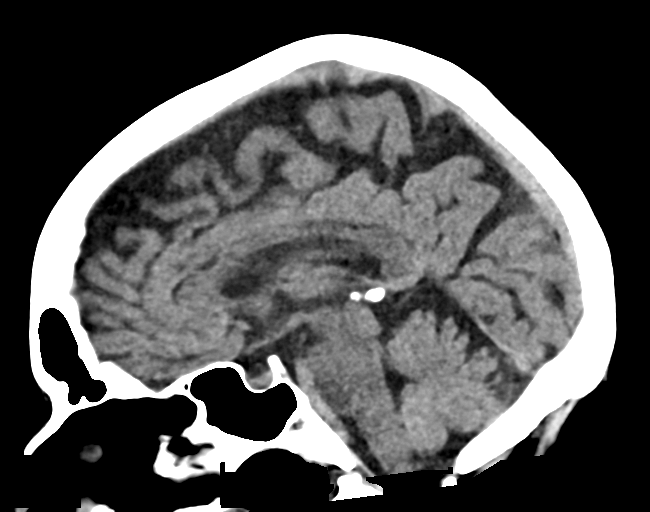
[im 39/59  brain]
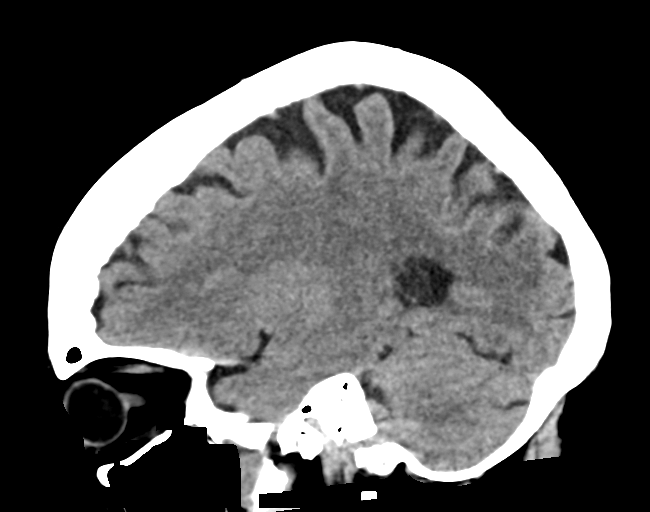

[16 of 47 positions shown; findings below may reference images not displayed]

FINDINGS: Brain: No acute infarct, hemorrhage, or mass lesion is present. Mild
atrophy and white matter changes are noted bilaterally. The basal
ganglia are intact. Insular ribbon is normal. No acute or focal
cortical abnormalities are present. The ventricles are of normal
size. No significant extraaxial fluid collection is present.

The brainstem and cerebellum are within normal limits.

Vascular: Atherosclerotic calcifications are present within the
cavernous internal carotid arteries bilaterally. No hyperdense
vessel is present.

Skull: Calvarium is intact. No focal lytic or blastic lesions are
present. No significant extracranial soft tissue lesion is present.

Sinuses/Orbits: The paranasal sinuses and mastoid air cells are
clear. Bilateral lens replacements are noted. Globes and orbits are
otherwise unremarkable.

ASPECTS ([place] Stroke Program Early CT Score)

- Ganglionic level infarction (caudate, lentiform nuclei, internal
capsule, insula, M1-M3 cortex): [DATE]

- Supraganglionic infarction (M4-M6 cortex): [DATE]

Total score (0-10 with 10 being normal): [DATE]
IMPRESSION: 1. No acute intracranial abnormality.
2. Normal CT appearance of the brain for age.
3. ASPECTS is [DATE].

The above was relayed via text pager to Dr. SIMKHADA on [DATE]

## 2020-06-13 IMAGING — MR MR HEAD W/O CM
12 of 13 series · 44 of 48 positions shown · non-contrast
Comparison: None.

CLINICAL DATA: Stroke follow-up. Expressive aphasia and dysarthria.

EXAM:
MRI HEAD WITHOUT CONTRAST
MRA HEAD WITHOUT CONTRAST
TECHNIQUE: Multiplanar, multiecho pulse sequences of the brain and surrounding
structures were obtained without intravenous contrast. Angiographic
images of the head were obtained using MRA technique without
contrast.

[Series 5: DWI · axial · 3.0mm · 0.88mm/px · z∈[-75,+77]mm · 8 of 104 slices shown (1 of 4)]
[im 1/104]
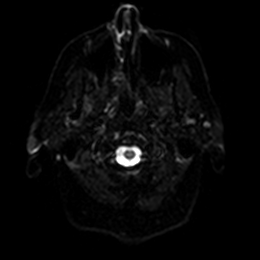
[im 15/104]
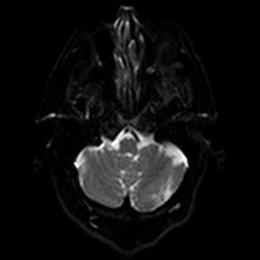
[im 30/104]
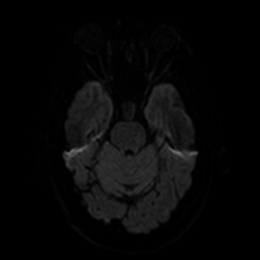
[im 45/104]
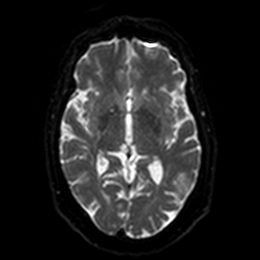
[im 59/104]
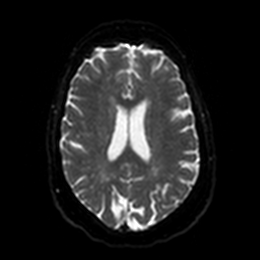
[im 74/104]
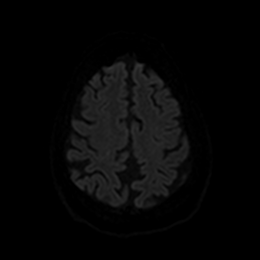
[im 89/104]
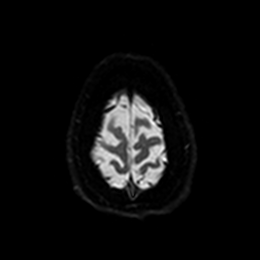
[im 104/104]
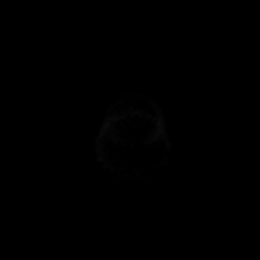

[Series 6: DWI · axial · 3.0mm · 0.88mm/px · z∈[-75,+77]mm · 4 of 52 slices shown (2 of 4)]
[im 1/52]
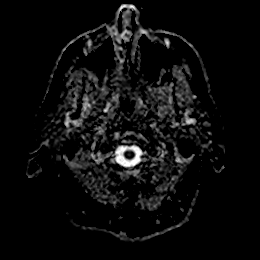
[im 18/52]
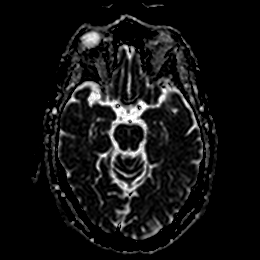
[im 35/52]
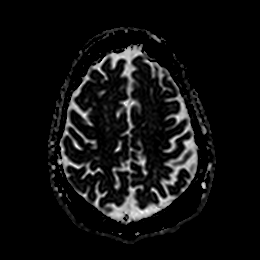
[im 52/52]
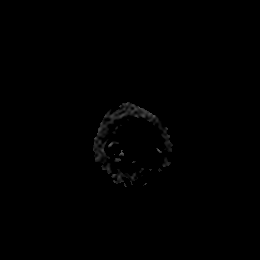

[Series 7: T1 · sagittal · 5.0mm · 0.75mm/px · 2 of 25 slices shown]
[im 1/25]
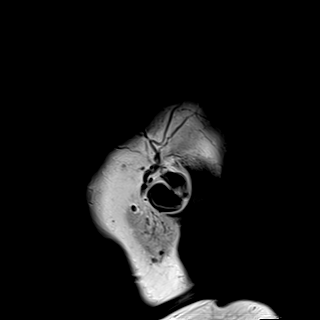
[im 25/25]
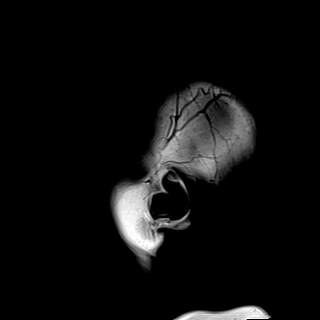

[Series 8: DWI · coronal · 4.0mm · 0.88mm/px · 5 of 70 slices shown (3 of 4)]
[im 1/70]
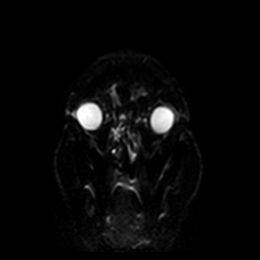
[im 18/70]
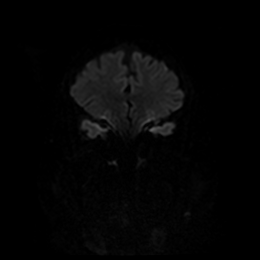
[im 35/70]
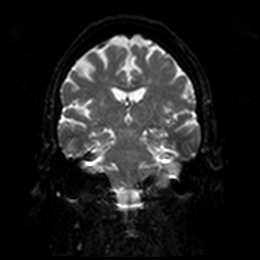
[im 52/70]
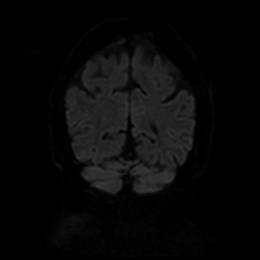
[im 70/70]
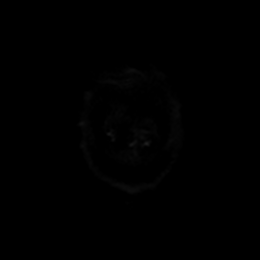

[Series 9: DWI · coronal · 4.0mm · 0.88mm/px · 3 of 35 slices shown (4 of 4)]
[im 1/35]
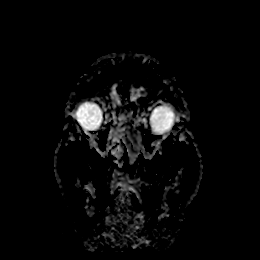
[im 18/35]
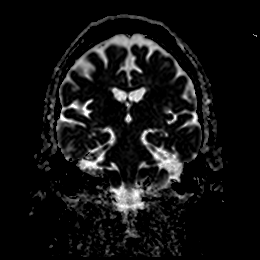
[im 35/35]
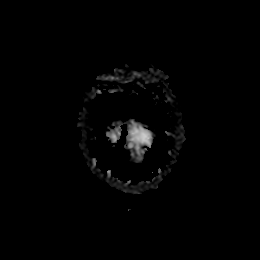

[Series 10: T2 · axial · 5.0mm · 0.72mm/px · z∈[-77,+79]mm · 2 of 27 slices shown (1 of 2)]
[im 1/27]
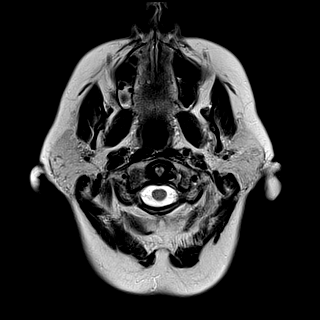
[im 27/27]
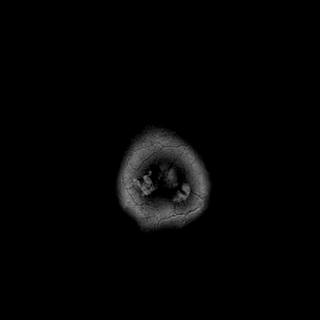

[Series 11: FLAIR · axial · 5.0mm · 0.45mm/px · z∈[-77,+78]mm · 2 of 27 slices shown]
[im 1/27]
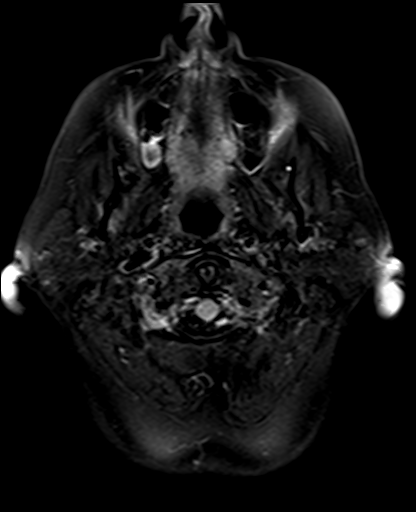
[im 27/27]
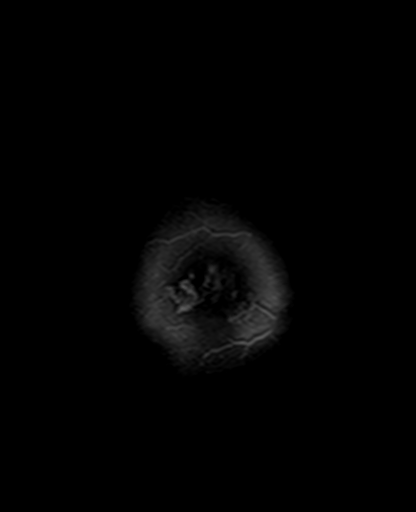

[Series 12: mag_images · axial · 3.0mm · 0.90mm/px · z∈[-94,+82]mm · 4 of 60 slices shown]
[im 1/60]
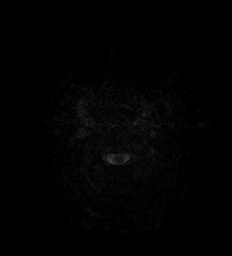
[im 20/60]
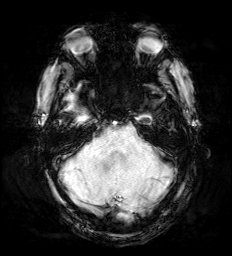
[im 40/60]
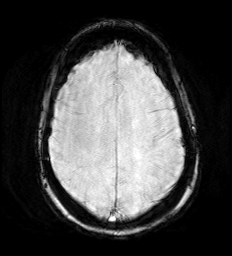
[im 60/60]
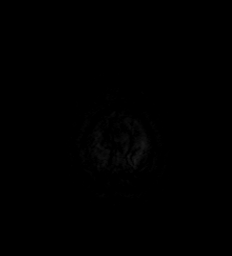

[Series 13: pha_images · axial · 3.0mm · 0.90mm/px · z∈[-94,+82]mm · 4 of 60 slices shown]
[im 1/60]
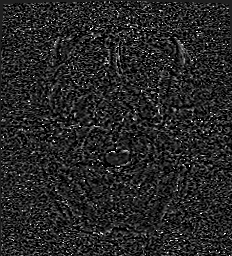
[im 20/60]
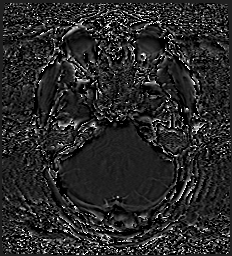
[im 40/60]
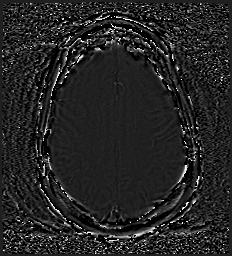
[im 60/60]
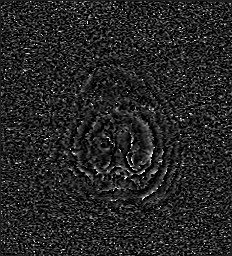

[Series 14: swi_images · axial · 3.0mm · 0.90mm/px · z∈[-94,+82]mm · 4 of 60 slices shown]
[im 1/60]
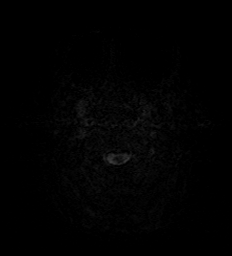
[im 20/60]
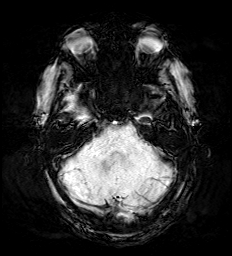
[im 40/60]
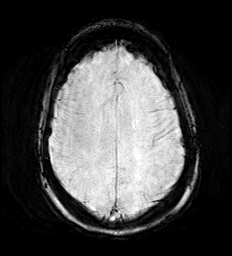
[im 60/60]
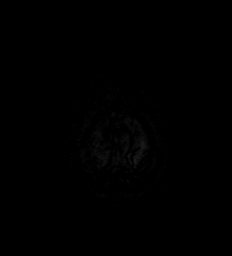

[Series 15: mip_images(sw) · axial · 24.0mm · 0.90mm/px · z∈[-84,+72]mm · 4 of 53 slices shown]
[im 1/53]
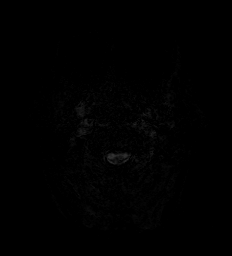
[im 18/53]
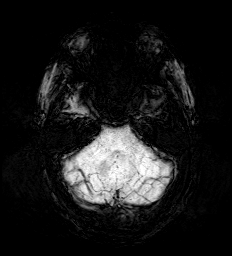
[im 35/53]
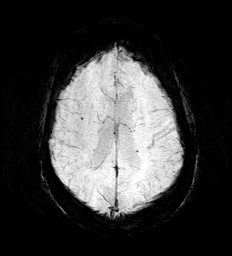
[im 53/53]
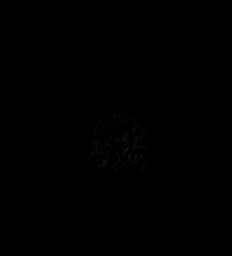

[Series 17: T2 · coronal · 5.0mm · 0.34mm/px · 2 of 30 slices shown (2 of 2)]
[im 1/30]
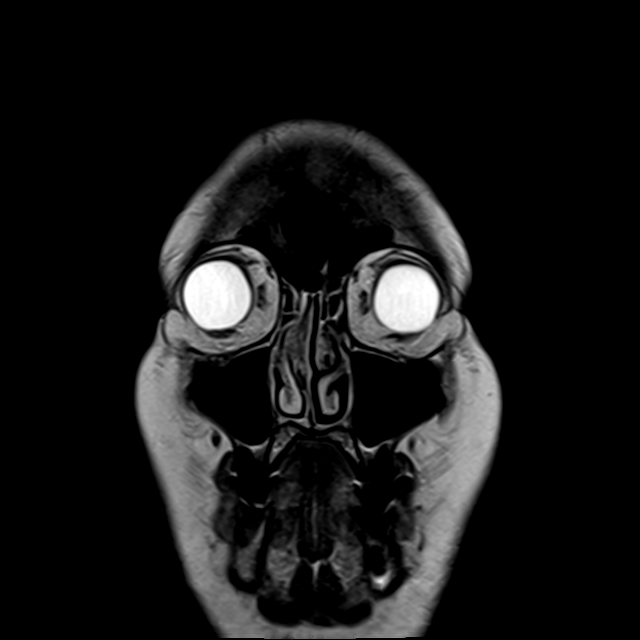
[im 30/30]
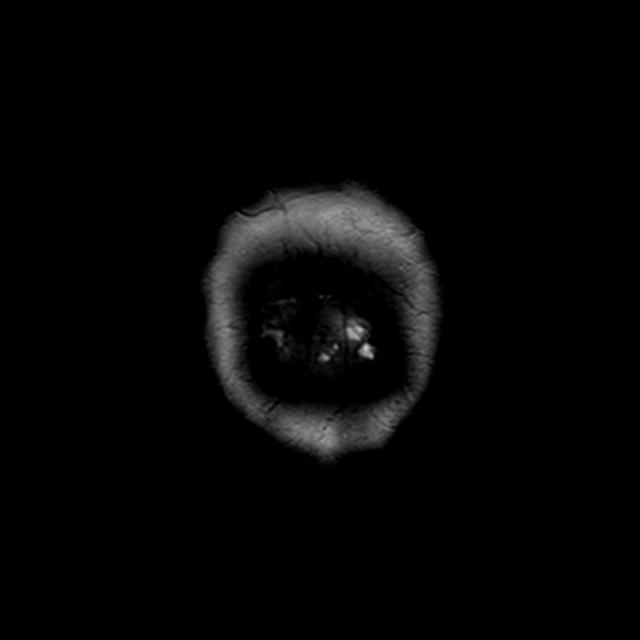

[44 of 48 positions shown; findings below may reference images not displayed]

FINDINGS: MRI HEAD FINDINGS

Brain: Abnormal diffusion restriction within the left corona radiata
and subcortical left parietal lobe. No acute or chronic hemorrhage.
There is multifocal hyperintense T2-weighted signal within the white
matter. Parenchymal volume and CSF spaces are normal. The midline
structures are normal.

Vascular: Major flow voids are preserved.

Skull and upper cervical spine: Normal calvarium and skull base.
Visualized upper cervical spine and soft tissues are normal.

Sinuses/Orbits:No paranasal sinus fluid levels or advanced mucosal
thickening. No mastoid or middle ear effusion. Normal orbits.

MRA HEAD FINDINGS

POSTERIOR CIRCULATION:

--Vertebral arteries: Normal

--Inferior cerebellar arteries: Normal.

--Basilar artery: Normal.

--Superior cerebellar arteries: Normal.

--Posterior cerebral arteries: Normal. The right PCA is
predominantly supplied by the posterior communicating artery.

ANTERIOR CIRCULATION:

--Intracranial internal carotid arteries: Normal.

--Anterior cerebral arteries (ACA): Normal.

--Middle cerebral arteries (MCA): Normal.

ANATOMIC VARIANTS: None
IMPRESSION: 1. Acute infarct of the left corona radiata and subcortical left
parietal lobe. No hemorrhage or mass effect.
2. Normal intracranial MRA.

## 2020-06-13 MED ORDER — LORAZEPAM 2 MG/ML IJ SOLN: 1.0000 mg | Freq: Once | INTRAMUSCULAR | Status: DC

## 2020-06-13 MED ORDER — OXYBUTYNIN 3.9 MG/24HR TD PTTW
1.0000 | MEDICATED_PATCH | TRANSDERMAL | Status: DC
  Filled 2020-06-13: qty 1

## 2020-06-13 MED ORDER — ONDANSETRON HCL 4 MG/2ML IJ SOLN
4.0000 mg | Freq: Four times a day (QID) | INTRAMUSCULAR | Status: DC | PRN
  Administered 2020-06-14: 4 mg via INTRAVENOUS
  Filled 2020-06-13: qty 2

## 2020-06-13 MED ORDER — GABAPENTIN 300 MG PO CAPS
300.0000 mg | ORAL_CAPSULE | Freq: Two times a day (BID) | ORAL | Status: DC
  Administered 2020-06-13 – 2020-06-15 (×4): 300 mg via ORAL
  Filled 2020-06-13 (×4): qty 1

## 2020-06-13 MED ORDER — SODIUM CHLORIDE 0.9% FLUSH: 3.0000 mL | Freq: Once | INTRAVENOUS | Status: DC

## 2020-06-13 MED ORDER — PANTOPRAZOLE SODIUM 40 MG PO TBEC
40.0000 mg | DELAYED_RELEASE_TABLET | Freq: Every day | ORAL | Status: DC
  Administered 2020-06-14 – 2020-06-15 (×2): 40 mg via ORAL
  Filled 2020-06-13 (×2): qty 1

## 2020-06-13 MED ORDER — EZETIMIBE 10 MG PO TABS
10.0000 mg | ORAL_TABLET | Freq: Every day | ORAL | Status: DC
  Administered 2020-06-14 – 2020-06-15 (×2): 10 mg via ORAL
  Filled 2020-06-13 (×2): qty 1

## 2020-06-13 MED ORDER — ACETAMINOPHEN 325 MG PO TABS: 650.0000 mg | ORAL_TABLET | ORAL | Status: DC | PRN

## 2020-06-13 MED ORDER — FLUOXETINE HCL 20 MG PO CAPS
20.0000 mg | ORAL_CAPSULE | Freq: Every day | ORAL | Status: DC
  Administered 2020-06-14 – 2020-06-15 (×2): 20 mg via ORAL
  Filled 2020-06-13 (×2): qty 1

## 2020-06-13 MED ORDER — ENOXAPARIN SODIUM 40 MG/0.4ML IJ SOSY
40.0000 mg | PREFILLED_SYRINGE | INTRAMUSCULAR | Status: DC
  Administered 2020-06-13: 40 mg via SUBCUTANEOUS
  Filled 2020-06-13: qty 0.4

## 2020-06-13 MED ORDER — SENNOSIDES-DOCUSATE SODIUM 8.6-50 MG PO TABS: 1.0000 | ORAL_TABLET | Freq: Every evening | ORAL | Status: DC | PRN

## 2020-06-13 MED ORDER — ACETAMINOPHEN 650 MG RE SUPP: 650.0000 mg | RECTAL | Status: DC | PRN

## 2020-06-13 MED ORDER — ASPIRIN 300 MG RE SUPP: 300.0000 mg | Freq: Every day | RECTAL | Status: DC

## 2020-06-13 MED ORDER — SODIUM CHLORIDE 0.9 % IV SOLN: INTRAVENOUS | Status: DC

## 2020-06-13 MED ORDER — ASPIRIN 325 MG PO TABS
325.0000 mg | ORAL_TABLET | Freq: Every day | ORAL | Status: DC
  Administered 2020-06-13 – 2020-06-14 (×2): 325 mg via ORAL
  Filled 2020-06-13 (×2): qty 1

## 2020-06-13 MED ORDER — PILOCARPINE HCL 5 MG PO TABS
5.0000 mg | ORAL_TABLET | Freq: Three times a day (TID) | ORAL | Status: DC
  Administered 2020-06-13 – 2020-06-15 (×6): 5 mg via ORAL
  Filled 2020-06-13 (×10): qty 1

## 2020-06-13 MED ORDER — INSULIN ASPART 100 UNIT/ML IJ SOLN
0.0000 [IU] | Freq: Three times a day (TID) | INTRAMUSCULAR | Status: DC
Start: 2020-06-13 — End: 2020-06-14
  Administered 2020-06-13 – 2020-06-14 (×2): 2 [IU] via SUBCUTANEOUS

## 2020-06-13 MED ORDER — ACETAMINOPHEN 160 MG/5ML PO SOLN: 650.0000 mg | ORAL | Status: DC | PRN

## 2020-06-13 MED ORDER — STROKE: EARLY STAGES OF RECOVERY BOOK
Freq: Once | Status: AC
  Filled 2020-06-13: qty 1

## 2020-06-13 MED ORDER — LORATADINE 10 MG PO TABS
10.0000 mg | ORAL_TABLET | Freq: Every day | ORAL | Status: DC
  Administered 2020-06-14 – 2020-06-15 (×2): 10 mg via ORAL
  Filled 2020-06-13 (×2): qty 1

## 2020-06-13 NOTE — Consult Note (Addendum)
Referring Physician: Dr. Johnney Killian    Chief Complaint: Acute onset of slurred speech, trouble reading and left facial droop  HPI: Lauren [name] is an 75 y.o. female with a PMHx of breast CA s/p bilateral mastectomies, HLD (statin intolerant), elevated A1c, osteopenia, reactive depression, Sjogren's syndrome, urinary incontinence and vitamin D deficiency who presents to the ED from home via EMS as a Code Stroke for acute onset of slurred speech, trouble reading and left facial droop. LKN was 11 PM yesterday, before going to bed. On awakening at 0920 she was texting someone and noticed that she was having difficulty reading the text messages. Daughter then noticed that the patient had facial asymmetry with the left side of her face appearing to contract more than the right. EMS was called and on arrival they also noted a facial droop as well as confusion and slurred speech. Vitals per EMS: BP 160/100, CBG 142, HR 65, RR 18, O2 sat 99% on RA.   On arrival to the ED, the patient is anxious with an ataxic dysarthria but intact speech fluency and comprehension.   LSN: 11 PM tPA Given: No: Out of the time window mRS: 0  Past Medical History:  Diagnosis Date  . Breast cancer ([place])    DCIS; BRCA 1 pos HER2 pos, ER2 pos  . DDD (degenerative disc disease)   . GERD (gastroesophageal reflux disease)   . HLD (hyperlipidemia)    statin intolerant  . Hyperglycemia 9/09   A1C elvated to 6.2  . Osteoarthritis   . Osteopenia 2007   ? from xray with nl dexa  . Reactive depression ([place])    very well controlled, and now takes prozac for sleep  . Sjogren's syndrome ([place])   . Urine incontinence   . Vitamin D deficiency     Past Surgical History:  Procedure Laterality Date  . BILATERAL OOPHORECTOMY  205  . CATARACT EXTRACTION, BILATERAL     10/2019, 12/2019  . COLONOSCOPY  5/07   normal; recheck 3 years  . LUMBAR DISC SURGERY  1991   Ruptured disk L5  . MASTECTOMY  2001   bilateral  .  TONSILLECTOMY  1974  . TOTAL HIP ARTHROPLASTY  2011  . TOTAL KNEE ARTHROPLASTY  2006   bilateral    Family History  Problem Relation Age of Onset  . Arthritis Other        family hx  . Breast cancer Other        1st degree relative <50  . Diabetes Other        1st degree relative  . Hyperlipidemia Other        family hx  . Hypertension Other        family hx  . Ovarian cancer Other        family hx  . Prostate cancer Other        1st degree relative <50  . Stroke Other        family hx  . Dementia Mother   . Psoriasis Brother   . Other Daughter        BRCA gene  . Brain cancer Brother    Social History:  reports that she has never smoked. She has never used smokeless tobacco. She reports previous alcohol use. She reports that she does not use drugs.  Allergies:  Allergies  Allergen Reactions  . Bee Venom   . Statins     REACTION: joint pain    Home Medications:  No current  facility-administered medications on file prior to encounter.   Current Outpatient Medications on File Prior to Encounter  Medication Sig Dispense Refill  . celecoxib (CELEBREX) 200 MG capsule Take 1 capsule (200 mg total) by mouth daily. With food 90 capsule 3  . cholecalciferol (VITAMIN D) 1000 units tablet Take 1,000 Units by mouth daily.    Marland Kitchen EPINEPHrine 0.3 mg/0.3 mL IJ SOAJ injection Inject 0.3 mLs (0.3 mg total) into the muscle once. 1 Device 5  . ezetimibe (ZETIA) 10 MG tablet Take 1 tablet (10 mg total) by mouth daily. 90 tablet 3  . Fexofenadine HCl (ALLEGRA PO) Take 1 tablet by mouth daily.    . Fish Oil-Cholecalciferol (OMEGA-3 + VITAMIN D3 PO) Take 2 capsules by mouth daily.     Marland Kitchen FLUoxetine (PROZAC) 20 MG capsule Take 1 capsule (20 mg total) by mouth daily. 90 capsule 3  . gabapentin (NEURONTIN) 300 MG capsule Take 1 capsule (300 mg total) by mouth 2 (two) times daily. 180 capsule 3  . omeprazole (PRILOSEC) 20 MG capsule Take 20 mg by mouth daily.    Marland Kitchen oxybutynin (OXYTROL) 3.9  MG/24HR Place 1 patch onto the skin 2 (two) times a week. 24 patch 3  . pilocarpine (SALAGEN) 5 MG tablet Take 1 tablet (5 mg total) by mouth 3 (three) times daily. 270 tablet 3     ROS: Denies any limb weakness, vision changes or ataxia. No confusion. Does not endorse any symptoms other than as documented in the HPI.   Physical Examination: Weight 116.8 kg.  HEENT: [place]/AT Lungs: Respirations unlabored Ext: No edema  Neurologic Examination: Mental Status: Awake and alert. Good attention. Pleasant and cooperative. Speech with mild ataxic dysarthria, but fluent with normal comprehension and intact naming. Oriented x 5.   Cranial Nerves: II:  Temporal visual fields intact with no extinction to DSS. PERRL.  III,IV, VI: No ptosis. EOMI. No nystagmus.  V,VII: Smile symmetric, facial temp sensation equal bilaterally VIII: Hearing intact to voice IX,X: Palate rises symmetrically  XI: Symmetric XII: Midline tongue extension  Motor: RUE with subtle 4+/5 weakness proximally and distally.  LUE 5/5 RLE with 3/5 hip flexion, 4/5 knee extension, 4/5 knee flexion LLE 5/5 proximally and distally  Sensory: Temp and light touch sensation intact to BUE and BLE without asymmetry. No extinction to DSS.  Deep Tendon Reflexes:  2+ bilateral biceps and brachioradialis 0 right patella, 1+ left patella 0 bilateral achilles Plantars: Toes are tonically upgoing without movement in response to plantar stimulation (mute) Cerebellar: No ataxia with FNF and RAM on the left. There is subtle ataxia with FNF and RAM on the right Gait: Deferred due to falls risk concerns  No results found for this or any previous visit (from the past 48 hour(s)). No results found.  Assessment: 75 y.o. female presenting from home with dysarthria and facial droop 1. Exam reveals findings best referable to a lesion in the right cerebellar hemisphere, most likely a subacute stroke 2. CT head: No acute intracranial abnormality.  Normal CT appearance of the brain for age.  3. Stroke Risk Factors - History of breast cancer, HLD, elevated HgbA1c and Sjogren's syndrome 4. Out of the tPA time window.  5. The patient is DNR/DNI. She states that she would decline any procedure requiring intubation, including thrombectomy.  6. EKG shows atrial flutter. There is low voltage in the precordial leads, borderline repolarization abnormality and  prolonged QT interval. She has also had symptoms consistent with PAF for several months.  Recommendations: 1. HgbA1c, fasting lipid panel 2. MRI, MRA of the brain without contrast 3. PT consult, OT consult, Speech consult 4. Echocardiogram 5. Carotid dopplers 6. Prophylactic therapy- Starting ASA. Dose 325 mg qd if PO, 300 mg if PR.   7. Risk factor modification 8. Telemetry monitoring 9. Frequent neuro checks 10. Has a statin allergy.  11. Modified permissive HTN for 24 hours. Treat SBP if > 200.  12. May need to start anticoagulation if PAF is confirmed, but the atrial flutter seen on EKG is itself an indication. May need a Cardiology consult for this as well. Stroke Team to assess whether she should be started on anticoagulation, pending results of MRI brain, TTE and vascular studies.    _0  signed: Dr. Kerney Elbe  06/13/2020, 2:00 PM

## 2020-06-13 NOTE — ED Provider Notes (Signed)
[facility] EMERGENCY DEPARTMENT Provider Note   CSN: [id number] Arrival date & time: 06/13/20  1351  An emergency department physician performed an initial assessment on this suspected stroke patient at 1357.  History Chief Complaint  Patient presents with  . Code Stroke    Lauren [name] is a 75 y.o. female.  HPI Patient reports she went to bed well last night.  She went to bed around 11pm. she reports she awakened this morning at 920.  She was unable to speak normally.  She reports she could not read or text her phone this morning.  He denies she has any focal weakness numbness or tingling of her extremities.  She reports she just having a lot of trouble generating words.  No headache.    Past Medical History:  Diagnosis Date  . Breast cancer ([place]) 2002   DCIS; BRCA 1 pos HER2 pos, ER2 pos  . DDD (degenerative disc disease)   . GERD (gastroesophageal reflux disease)   . HLD (hyperlipidemia)    statin intolerant  . Hyperglycemia 9/09   A1C elvated to 6.2  . Osteoarthritis   . Osteopenia 2007   ? from xray with nl dexa  . Reactive depression (situational)    very well controlled, and now takes prozac for sleep  . Sjogren's syndrome ([place])   . Urine incontinence   . Vitamin D deficiency     Patient Active Problem List   Diagnosis Date Noted  . Acute CVA (cerebrovascular accident) ([place]) 06/13/2020  . Elevated blood pressure reading 01/28/2020  . Carpal tunnel syndrome 01/25/2019  . Vitamin B12 deficiency 03/27/2018  . Sjogren's disease ([place]) 01/15/2018  . Osteoarthritis 01/15/2018  . Obesity (BMI 30-39.9) 01/15/2018  . H/O herpes zoster 11/22/2017  . Estrogen deficiency 01/09/2017  . Routine general medical examination at a health care facility 01/01/2017  . OSA (obstructive sleep apnea) 09/18/2016  . Colon cancer screening 12/24/2014  . Bee sting allergy 06/10/2014  . Encounter for Medicare annual wellness exam 12/03/2013  . History of shingles  11/27/2013  . Hx of breast cancer 08/22/2011  . Gynecological examination 02/14/2011  . Bilateral dry eyes 01/20/2011  . Vitamin D deficiency 04/22/2008  . Hyperlipidemia 04/22/2008  . GERD 04/22/2008  . Prediabetes 04/22/2008    Past Surgical History:  Procedure Laterality Date  . BILATERAL OOPHORECTOMY  205  . CATARACT EXTRACTION, BILATERAL     10/2019, 12/2019  . COLONOSCOPY  5/07   normal; recheck 3 years  . LUMBAR DISC SURGERY  1991   Ruptured disk L5  . MASTECTOMY  2001   bilateral  . TONSILLECTOMY  1974  . TOTAL HIP ARTHROPLASTY  2011  . TOTAL KNEE ARTHROPLASTY  2006   bilateral     OB History   No obstetric history on file.     Family History  Problem Relation Age of Onset  . Arthritis Other        family hx  . Breast cancer Other        1st degree relative <50  . Diabetes Other        1st degree relative  . Hyperlipidemia Other        family hx  . Hypertension Other        family hx  . Ovarian cancer Other        family hx  . Prostate cancer Other        1st degree relative <50  . Dementia Mother   .  Psoriasis Brother   . Other Daughter        BRCA gene  . Brain cancer Brother   . Stroke Maternal Grandmother     Social History   Tobacco Use  . Smoking status: Never Smoker  . Smokeless tobacco: Never Used  Vaping Use  . Vaping Use: Never used  Substance Use Topics  . Alcohol use: Not Currently    Alcohol/week: 0.0 standard drinks    Comment: rare, none in 2 years  . Drug use: No    Home Medications Prior to Admission medications   Medication Sig Start Date End Date Taking? Authorizing Provider  celecoxib (CELEBREX) 200 MG capsule Take 1 capsule (200 mg total) by mouth daily. With food 02/19/20   Tower, Wynelle Fanny, MD  cholecalciferol (VITAMIN D) 1000 units tablet Take 1,000 Units by mouth daily.    [provider]  EPINEPHrine 0.3 mg/0.3 mL IJ SOAJ injection Inject 0.3 mLs (0.3 mg total) into the muscle once. 06/10/14   Tower, Wynelle Fanny, MD  ezetimibe (ZETIA) 10 MG tablet Take 1 tablet (10 mg total) by mouth daily. 02/19/20   Tower, Wynelle Fanny, MD  Fexofenadine HCl (ALLEGRA PO) Take 1 tablet by mouth daily.    [provider]  Fish Oil-Cholecalciferol (OMEGA-3 + VITAMIN D3 PO) Take 2 capsules by mouth daily.     [provider]  FLUoxetine (PROZAC) 20 MG capsule Take 1 capsule (20 mg total) by mouth daily. 02/19/20   Tower, Wynelle Fanny, MD  gabapentin (NEURONTIN) 300 MG capsule Take 1 capsule (300 mg total) by mouth 2 (two) times daily. 02/19/20   Tower, Wynelle Fanny, MD  omeprazole (PRILOSEC) 20 MG capsule Take 20 mg by mouth daily.    [provider]  oxybutynin (OXYTROL) 3.9 MG/24HR Place 1 patch onto the skin 2 (two) times a week. 02/20/20   Tower, Wynelle Fanny, MD  pilocarpine (SALAGEN) 5 MG tablet Take 1 tablet (5 mg total) by mouth 3 (three) times daily. 02/19/20   Tower, Wynelle Fanny, MD    Allergies    Bee venom and Statins  Review of Systems   Review of Systems 10 systems reviewed and negative except as per HPI Physical Exam Updated Vital Signs BP 130/67 (BP Location: Right Arm)   Pulse 98   Temp 98.5 F (36.9 C) (Oral)   Resp 18   Ht '5\' 8"'  (1.727 m)   Wt 117 kg   SpO2 95%   BMI 39.22 kg/m   Physical Exam Constitutional:      Appearance: She is well-developed.  HENT:     Head: Normocephalic and atraumatic.  Eyes:     Pupils: Pupils are equal, round, and reactive to light.  Cardiovascular:     Rate and Rhythm: Normal rate and regular rhythm.     Heart sounds: Normal heart sounds.  Pulmonary:     Effort: Pulmonary effort is normal.     Breath sounds: Normal breath sounds.  Abdominal:     General: Bowel sounds are normal. There is no distension.     Palpations: Abdomen is soft.     Tenderness: There is no abdominal tenderness.  Musculoskeletal:        General: Normal range of motion.     Cervical back: Neck supple.  Skin:    General: Skin is warm and dry.  Neurological:     Mental  Status: She is alert and oriented to person, place, and time.     GCS:  GCS eye subscore is 4. GCS verbal subscore is 5. GCS motor subscore is 6.     Coordination: Coordination normal.     Comments: Patient is alert.  GCS 15.  She has having trouble with word finding and generating speech.  Her speech is intelligible but sometimes she has to pause and cannot generate her thought.  He understands without difficulty and follows commands.  5\5 motor strength upper and lower extremities.  Sensation intact to light touch x4 extremities.  Psychiatric:        Mood and Affect: Mood normal.     ED Results / Procedures / Treatments   Labs (all labs ordered are listed, but only abnormal results are displayed) Labs Reviewed  CBC - Abnormal; Notable for the following components:      Result Value   WBC 10.6 (*)    All other components within normal limits  DIFFERENTIAL - Abnormal; Notable for the following components:   Neutro Abs 7.8 (*)    All other components within normal limits  COMPREHENSIVE METABOLIC PANEL - Abnormal; Notable for the following components:   Glucose, Bld 141 (*)    Calcium 8.7 (*)    All other components within normal limits  I-STAT CHEM 8, ED - Abnormal; Notable for the following components:   Glucose, Bld 140 (*)    Calcium, Ion 1.08 (*)    All other components within normal limits  PROTIME-INR  APTT  CBG MONITORING, ED    EKG EKG Interpretation  Date/Time:  Saturday Jun 13 2020 14:15:18 EDT Ventricular Rate:  113 PR Interval:    QRS Duration: 88 QT Interval:  367 QTC Calculation: 504 R Axis:   14 Text Interpretation: Atrial flutter Low voltage, precordial leads Borderline repolarization abnormality Prolonged QT interval no old comparison Confirmed by Charlesetta Shanks [phone]) on 06/13/2020 4:15:26 PM   Radiology CT HEAD CODE STROKE WO CONTRAST  Result Date: 06/13/2020 CLINICAL DATA:  Code stroke. Neuro deficit, acute, stroke suspected. Acute onset of left  facial droop. Last seen normal at 11 p.m. last night. EXAM: CT HEAD WITHOUT CONTRAST TECHNIQUE: Contiguous axial images were obtained from the base of the skull through the vertex without intravenous contrast. COMPARISON:  None. FINDINGS: Brain: No acute infarct, hemorrhage, or mass lesion is present. Mild atrophy and white matter changes are noted bilaterally. The basal ganglia are intact. Insular ribbon is normal. No acute or focal cortical abnormalities are present. The ventricles are of normal size. No significant extraaxial fluid collection is present. The brainstem and cerebellum are within normal limits. Vascular: Atherosclerotic calcifications are present within the cavernous internal carotid arteries bilaterally. No hyperdense vessel is present. Skull: Calvarium is intact. No focal lytic or blastic lesions are present. No significant extracranial soft tissue lesion is present. Sinuses/Orbits: The paranasal sinuses and mastoid air cells are clear. Bilateral lens replacements are noted. Globes and orbits are otherwise unremarkable. ASPECTS [facility] Stroke Program Early CT Score) - Ganglionic level infarction (caudate, lentiform nuclei, internal capsule, insula, M1-M3 cortex): 7/7 - Supraganglionic infarction (M4-M6 cortex): 3/3 Total score (0-10 with 10 being normal): 10/10 IMPRESSION: 1. No acute intracranial abnormality. 2. Normal CT appearance of the brain for age. 3. ASPECTS is 10/10. The above was relayed via text pager to Dr. Kerney Elbe on 06/13/2020 at 14:10 . Electronically Signed   By: San Morelle M.D.   On: 06/13/2020 14:10    Procedures Procedures   Medications Ordered in ED Medications  ezetimibe (ZETIA) tablet 10 mg (has no  administration in time range)  FLUoxetine (PROZAC) capsule 20 mg (has no administration in time range)  pantoprazole (PROTONIX) EC tablet 40 mg (has no administration in time range)  oxybutynin (OXYTROL) 3.9 MG/24HR patch 1 patch (has no administration in  time range)  gabapentin (NEURONTIN) capsule 300 mg (has no administration in time range)  loratadine (CLARITIN) tablet 10 mg (has no administration in time range)  pilocarpine (SALAGEN) tablet 5 mg (has no administration in time range)  enoxaparin (LOVENOX) injection 40 mg (has no administration in time range)   stroke: mapping our early stages of recovery book (has no administration in time range)  0.9 %  sodium chloride infusion (has no administration in time range)  acetaminophen (TYLENOL) tablet 650 mg (has no administration in time range)    Or  acetaminophen (TYLENOL) 160 MG/5ML solution 650 mg (has no administration in time range)    Or  acetaminophen (TYLENOL) suppository 650 mg (has no administration in time range)  senna-docusate (Senokot-S) tablet 1 tablet (has no administration in time range)  aspirin suppository 300 mg (has no administration in time range)    Or  aspirin tablet 325 mg (has no administration in time range)  insulin aspart (novoLOG) injection 0-15 Units (has no administration in time range)  LORazepam (ATIVAN) injection 1 mg (has no administration in time range)  ondansetron (ZOFRAN) injection 4 mg (has no administration in time range)    ED Course  I have reviewed the triage vital signs and the nursing notes.  Pertinent labs & imaging results that were available during my care of the patient were reviewed by me and considered in my medical decision making (see chart for details).  Clinical Course as of 06/13/20 1615  Sat Jun 13, 2020  1525 Consult: Reviewed with Dr. Cheral Marker.  Not candidate for interventional therapy.  Admit for stroke work-up. [MP]  1525 Consult: Reviewed Dr. Lorin [facility] hospitalist for admission. [MP]    Clinical Course User Index [MP] Charlesetta Shanks, MD   MDM Rules/Calculators/A&P                          Patient presents as outlined with expressive aphasia.  Last known well was greater than 12 hours ago.  Patient's airway is  protected.  Her mental status is alert.  She does not have signs of other metabolic or encephalopathic derangements.  Patient will be admitted for stroke evaluation to hospitalist service. Final Clinical Impression(s) / ED Diagnoses Final diagnoses:  Acute ischemic stroke [facility])    Rx / DC Orders ED Discharge Orders    None       Charlesetta Shanks, MD 06/13/20 1616

## 2020-06-13 NOTE — ED Triage Notes (Signed)
Pt BIBA from home as a Code Stroke. Pt was normal at 2300 last night and at 0920 this morning the patient was unable to read or text her daughter. Pt was also noted to have slurred speech. Medics noticed at 1306 the patient had progression of left facial droop and an increase in slurred speech. EMS CBG 142. GCS 15.

## 2020-06-13 NOTE — H&P (Signed)
History and Physical    Lauren [name] SFK:[id number] DOB: 06-30-1945 DOA: 06/13/2020  PCP: Abner Greenspan, MD Consultants:  [facility] - pulmonology; [facility] - rheumatology; [facility] Ghazi - neurology Patient coming from:  Home - lives with daughter and her family; NOK:  Daughter, [phone]   Chief Complaint: Neurologic symptoms  HPI: Lauren [name] is a 75 y.o. female with medical history significant of Sjogren's; HLD; OSA on CPAP; obesity; and breast cancer presenting with neurologic symptoms concerning for stroke.  She woke up about 920 and realized that she was unable to read her text messages.  Her speech was different.  She was unable to read her Bible.  She feels like it was her brain, not her vision - like the letters were backwards.  She had expressive aphasia, also some dysarthria.  No motor symptoms but some tingling in the left foot and side - all the way up and down.  No dysphagia.  No headache.  No h/o stroke.  She does not take ASA daily.  Upon review, she had aflutter at the time presentation - but not at the time of my evaluation or subsequent visit to discuss.  She reports that she was told by pulm at a prior appointment that she was in afib; this appears to have been noted on PE on 3/10 but not in the A/P.  She reports that she can tell when she goes in and out of afib and has been experiencing this at home.      ED Course:  Code stroke - per neurology, not a candidate for intervention.  Awoke this AM with dysarthria, aphasia.    Review of Systems: As per HPI; otherwise review of systems reviewed and negative.   Ambulatory Status:  Ambulates without assistance  COVID Vaccine Status:   Complete  Past Medical History:  Diagnosis Date  . Breast cancer ([place]) 2002   DCIS; BRCA 1 pos HER2 pos, ER2 pos  . DDD (degenerative disc disease)   . DNR (do not resuscitate) 06/13/2020  . GERD (gastroesophageal reflux disease)   . HLD (hyperlipidemia)    statin intolerant  . Hyperglycemia 9/09    A1C elvated to 6.2  . Osteoarthritis   . Osteopenia 2007   ? from xray with nl dexa  . Reactive depression (situational)    very well controlled, and now takes prozac for sleep  . Sjogren's syndrome ([place])   . Urine incontinence   . Vitamin D deficiency     Past Surgical History:  Procedure Laterality Date  . BILATERAL OOPHORECTOMY  205  . CATARACT EXTRACTION, BILATERAL     10/2019, 12/2019  . COLONOSCOPY  5/07   normal; recheck 3 years  . LUMBAR DISC SURGERY  1991   Ruptured disk L5  . MASTECTOMY  2001   bilateral  . TONSILLECTOMY  1974  . TOTAL HIP ARTHROPLASTY  2011  . TOTAL KNEE ARTHROPLASTY  2006   bilateral    Social History   Socioeconomic History  . Marital status: Widowed    Spouse name: Not on file  . Number of children: 3  . Years of education: Not on file  . Highest education level: Not on file  Occupational History  . Occupation: Retired  Tobacco Use  . Smoking status: Never Smoker  . Smokeless tobacco: Never Used  Vaping Use  . Vaping Use: Never used  Substance and Sexual Activity  . Alcohol use: Not Currently    Alcohol/week: 0.0 standard drinks    Comment:  rare, none in 2 years  . Drug use: No  . Sexual activity: Not Currently  Other Topics Concern  . Not on file  Social History Narrative   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      Regular exercise-yes   Social Determinants of Health   Financial Resource Strain: [place]   . Difficulty of Paying Living Expenses: Not hard at all  Food Insecurity: No Food Insecurity  . Worried About Charity fundraiser in the Last Year: Never true  . Ran Out of Food in the Last Year: Never true  Transportation Needs: No Transportation Needs  . Lack of Transportation (Medical): No  . Lack of Transportation (Non-Medical): No  Physical Activity: Inactive  . Days of Exercise per Week: 0 days  . Minutes of Exercise per Session: 0 min  Stress: No Stress  Concern Present  . Feeling of Stress : Not at all  Social Connections: Not on file  Intimate Partner Violence: Not At Risk  . Fear of Current or Ex-Partner: No  . Emotionally Abused: No  . Physically Abused: No  . Sexually Abused: No    Allergies  Allergen Reactions  . Bee Venom   . Statins     REACTION: joint pain    Family History  Problem Relation Age of Onset  . Arthritis Other        family hx  . Breast cancer Other        1st degree relative <50  . Diabetes Other        1st degree relative  . Hyperlipidemia Other        family hx  . Hypertension Other        family hx  . Ovarian cancer Other        family hx  . Prostate cancer Other        1st degree relative <50  . Dementia Mother   . Psoriasis Brother   . Other Daughter        BRCA gene  . Brain cancer Brother   . Stroke Maternal Grandmother     Prior to Admission medications   Medication Sig Start Date End Date Taking? Authorizing Provider  celecoxib (CELEBREX) 200 MG capsule Take 1 capsule (200 mg total) by mouth daily. With food 02/19/20   Tower, Wynelle Fanny, MD  cholecalciferol (VITAMIN D) 1000 units tablet Take 1,000 Units by mouth daily.    [provider]  EPINEPHrine 0.3 mg/0.3 mL IJ SOAJ injection Inject 0.3 mLs (0.3 mg total) into the muscle once. 06/10/14   Tower, Wynelle Fanny, MD  ezetimibe (ZETIA) 10 MG tablet Take 1 tablet (10 mg total) by mouth daily. 02/19/20   Tower, Wynelle Fanny, MD  Fexofenadine HCl (ALLEGRA PO) Take 1 tablet by mouth daily.    [provider]  Fish Oil-Cholecalciferol (OMEGA-3 + VITAMIN D3 PO) Take 2 capsules by mouth daily.     [provider]  FLUoxetine (PROZAC) 20 MG capsule Take 1 capsule (20 mg total) by mouth daily. 02/19/20   Tower, Wynelle Fanny, MD  gabapentin (NEURONTIN) 300 MG capsule Take 1 capsule (300 mg total) by mouth 2 (two) times daily. 02/19/20   Tower, Wynelle Fanny, MD  omeprazole (PRILOSEC) 20 MG capsule Take 20 mg by mouth daily.    [provider]  oxybutynin (OXYTROL) 3.9 MG/24HR Place 1 patch onto  the skin 2 (two) times a week. 02/20/20   Tower, Wynelle Fanny, MD  pilocarpine (SALAGEN) 5 MG tablet Take 1 tablet (5 mg total) by mouth 3 (three) times daily. 02/19/20   Abner Greenspan, MD    Physical Exam: Vitals:   06/13/20 1300 06/13/20 1415 06/13/20 1418  BP:  130/67   Pulse:  98   Resp:  18   Temp:  98.5 F (36.9 C)   TempSrc:  Oral   SpO2:  95%   Weight: 116.8 kg  117 kg  Height:   '5\' 8"'  (1.727 m)     . General:  Appears calm and comfortable and is in NAD . Eyes:   EOMI, normal lids, iris . ENT:  grossly normal hearing, lips & tongue, mmm . Neck:  no LAD, masses or thyromegaly . Cardiovascular:  RRR, no m/r/g. No LE edema.  Marland Kitchen Respiratory:   CTA bilaterally with no wheezes/rales/rhonchi.  Normal respiratory effort. . Abdomen:  soft, NT, ND . Skin:  no rash or induration seen on limited exam . Musculoskeletal:  grossly normal tone BUE/BLE, good ROM, no bony abnormality . Psychiatric: blunted and emotionally labile mood and affect, speech fluent and appropriate with subtle dysarthria, AOx3 . Neurologic:  CN 2-12 grossly intact other than subtle R facial droop, moves all extremities in coordinated fashion, sensation mildly diminished on L V2-3 face and L body diffusely    Radiological Exams on Admission: Independently reviewed - see discussion in A/P where applicable  CT HEAD CODE STROKE WO CONTRAST  Result Date: 06/13/2020 CLINICAL DATA:  Code stroke. Neuro deficit, acute, stroke suspected. Acute onset of left facial droop. Last seen normal at 11 p.m. last night. EXAM: CT HEAD WITHOUT CONTRAST TECHNIQUE: Contiguous axial images were obtained from the base of the skull through the vertex without intravenous contrast. COMPARISON:  None. FINDINGS: Brain: No acute infarct, hemorrhage, or mass lesion is present. Mild atrophy and white matter changes are noted bilaterally. The basal ganglia are intact. Insular ribbon is  normal. No acute or focal cortical abnormalities are present. The ventricles are of normal size. No significant extraaxial fluid collection is present. The brainstem and cerebellum are within normal limits. Vascular: Atherosclerotic calcifications are present within the cavernous internal carotid arteries bilaterally. No hyperdense vessel is present. Skull: Calvarium is intact. No focal lytic or blastic lesions are present. No significant extracranial soft tissue lesion is present. Sinuses/Orbits: The paranasal sinuses and mastoid air cells are clear. Bilateral lens replacements are noted. Globes and orbits are otherwise unremarkable. ASPECTS [facility] Stroke Program Early CT Score) - Ganglionic level infarction (caudate, lentiform nuclei, internal capsule, insula, M1-M3 cortex): 7/7 - Supraganglionic infarction (M4-M6 cortex): 3/3 Total score (0-10 with 10 being normal): 10/10 IMPRESSION: 1. No acute intracranial abnormality. 2. Normal CT appearance of the brain for age. 3. ASPECTS is 10/10. The above was relayed via text pager to Dr. Kerney Elbe on 06/13/2020 at 14:10 . Electronically Signed   By: San Morelle M.D.   On: 06/13/2020 14:10    EKG: Independently reviewed.  Atrial flutter with rate 113; prolonged QTc 504; nonspecific ST changes with no evidence of acute ischemia   Labs on Admission: I have personally reviewed the available labs and imaging studies at the time of the admission.  Pertinent labs:   Glucose 141 WBC 10.6   Assessment/Plan Principal Problem:   Acute CVA (cerebrovascular accident) ([place]) Active Problems:   Hyperlipidemia   Prediabetes   OSA (obstructive sleep apnea)  Sjogren's disease ([place])   Obesity (BMI 30-39.9)   DNR (do not resuscitate)   CVA -Patient had acute onset this AM upon awakening of expressive aphasia, dysarthria, and L-sided tingling on the face and body -Concerning for CVA - particularly given her apparent PAF (untreated, see below) -ABCD2  score is 4 -tPA cannot be given since onset time of symptoms is not known (sometime overnight) -Aspirin has been given to reduce stroke mortality and decrease morbidity -Will admit for further CVA evaluation -Telemetry monitoring -MRI/MRA -Carotid dopplers; if ipsilateral carotid stenosis is detected then prompt vascular surgery consultation is needed for consideration of CEA. -Echo -Risk stratification with FLP, A1c -Patient will need DAPT for 21 days when ABCD2 score is at least 4 and NIH score is 3 or less, and then can transition to monotherapy with a single antiplatelet agent.   Will defer to neurology for now, particularly since she appears to need [facility]. -Consider thrombectomy if there is persistent disabling neurologic deficit associated with a vascular cut-off -Neurology consult -PT/OT/ST/Nutrition Consults  New onset aflutter -Patient with apparent discussion about afib during pulm appt in March, told to f/u with PCP -Was noted to be in flutter on initial EKG but was in NSR at the time of my initial and subsequent evaluations - appears to be PAF, which appears to be something patient can tell by symptoms -She appears likely to need [facility] -Based on risk of hemorrhagic conversion, will hold for now pending MRI -Risks/benefits of Coumadin vs. DOAC therapy discussed -Will request TOC team consult re: cost analysis of DOAC therapy given her insurance  HTN -Allow permissive HTN for now -Treat BP only if >220/120, and then with goal of 15% reduction -She does not take home medications for this issue   HLD -Check FLP -Reported statin intolerance -May benefit from [place]; recommend outpatient referral to lipid clinic -Continue Zetia for now   Pre-DM -Prior A1c shows reasonably good control, 6.1 in 02/2020; will recheck -Will order moderate-scale SSI  Sjogren's -Continue pilocarpine  Depression -Continue Prozac  Obesity -Body mass index is 39.22 kg/m..  -Weight loss should be  encouraged -Outpatient PCP/bariatric medicine f/u encouraged   OSA -Resume CPAP at the time of d/c  DNR -I have discussed code status with the patient and her daughter and  they are in agreement that the patient would not desire resuscitation and would prefer to die a natural death should that situation arise. -She will need a gold out of facility DNR form at the time of discharge    Note: This patient has been tested and is pending for the novel coronavirus COVID-19. She has been fully vaccinated against COVID-19.    DVT prophylaxis:  Lovenox  Code Status: DNR - confirmed with patient/family Family Communication: Daughter present throughout evaluation Disposition Plan:  The patient is from: home  Anticipated d/c is to: be determined  Anticipated d/c date will depend on clinical response to treatment, but possibly as early as tomorrow if she has excellent response to treatment  Patient is currently: acutely ill Consults called: Neurology; PT/OT/ST/Nutrition; [place] team Admission status: Admit - It is my clinical opinion that admission to INPATIENT is reasonable and necessary because of the expectation that this patient will require hospital care that crosses at least 2 midnights to treat this condition based on the medical complexity of the problems presented.  Given the aforementioned information, the predictability of an adverse outcome is felt to be significant.    Karmen Bongo MD [facility]  How to contact the [facility] Attending or Consulting provider [place] or covering provider during after hours [place], for this patient?  1. Check the care team in [facility] and look for a) attending/consulting TRH provider listed and b) the [facility] team listed 2. Log into [email or web address] and use [place]'s universal password to access. If you do not have the password, please contact the hospital operator. 3. Locate the [facility] provider you are looking for under [facility] and page to a number  that you can be directly reached. 4. If you still have difficulty reaching the provider, please page the [facility] (Director on Call) for the Hospitalists listed on amion for assistance.   06/13/2020, 4:52 PM

## 2020-06-13 NOTE — ED Notes (Signed)
Attempted report x1. 

## 2020-06-13 NOTE — TOC Benefit Eligibility Note (Signed)
Transition of Care [facility]) Benefit Eligibility Note    Patient Details  Name: Lauren [name] MRN: [id number] Date of Birth: 09/13/1945    Note send to [place] pool to define eligibility for [place], RN Phone Number: 06/13/2020, 5:07 PM

## 2020-06-13 NOTE — Progress Notes (Signed)
Carotid artery duplex completed. Refer to "CV Proc" under chart review to view preliminary results.  06/13/2020 5:02 PM Kelby Aline., MHA, RVT, RDCS, RDMS

## 2020-06-14 ENCOUNTER — Inpatient Hospital Stay (HOSPITAL_COMMUNITY): Payer: Medicare Other

## 2020-06-14 DIAGNOSIS — I6389 Other cerebral infarction: Secondary | ICD-10-CM

## 2020-06-14 DIAGNOSIS — I639 Cerebral infarction, unspecified: Secondary | ICD-10-CM | POA: Diagnosis not present

## 2020-06-14 DIAGNOSIS — E7800 Pure hypercholesterolemia, unspecified: Secondary | ICD-10-CM | POA: Diagnosis not present

## 2020-06-14 LAB — GLUCOSE, CAPILLARY
Glucose-Capillary: 124 mg/dL — ABNORMAL HIGH (ref 70–99)
Glucose-Capillary: 109 mg/dL — ABNORMAL HIGH (ref 70–99)
Glucose-Capillary: 114 mg/dL — ABNORMAL HIGH (ref 70–99)

## 2020-06-14 LAB — LIPID PANEL
Cholesterol: 193 mg/dL (ref 0–200)
HDL: 51 mg/dL (ref >40)
LDL Cholesterol: 124 mg/dL — ABNORMAL HIGH (ref 0–99)
Total CHOL/HDL Ratio: 3.8 RATIO
Triglycerides: 91 mg/dL (ref <150)
VLDL: 18 mg/dL (ref 0–40)

## 2020-06-14 LAB — TSH: TSH: 0.744 u[IU]/mL (ref 0.350–4.500)

## 2020-06-14 LAB — HEMOGLOBIN A1C
Hgb A1c MFr Bld: 6.1 % — ABNORMAL HIGH (ref 4.8–5.6)
Mean Plasma Glucose: 128.37 mg/dL

## 2020-06-14 LAB — VITAMIN B12: Vitamin B-12: 479 pg/mL (ref 180–914)

## 2020-06-14 LAB — ECHOCARDIOGRAM COMPLETE
Height: 68 in
S' Lateral: 2.7 cm
Weight: 4127.01 oz

## 2020-06-14 MED ORDER — METOPROLOL TARTRATE 25 MG PO TABS
25.0000 mg | ORAL_TABLET | Freq: Two times a day (BID) | ORAL | Status: DC
  Filled 2020-06-14: qty 1

## 2020-06-14 MED ORDER — DIGOXIN 0.25 MG/ML IJ SOLN
0.2500 mg | Freq: Once | INTRAMUSCULAR | Status: DC
  Filled 2020-06-14: qty 1

## 2020-06-14 MED ORDER — METOPROLOL TARTRATE 12.5 MG HALF TABLET
12.5000 mg | ORAL_TABLET | Freq: Once | ORAL | Status: AC
  Administered 2020-06-14: 12.5 mg via ORAL
  Filled 2020-06-14: qty 1

## 2020-06-14 MED ORDER — APIXABAN 5 MG PO TABS
5.0000 mg | ORAL_TABLET | Freq: Two times a day (BID) | ORAL | Status: DC
  Administered 2020-06-14 – 2020-06-15 (×2): 5 mg via ORAL
  Filled 2020-06-14 (×2): qty 1

## 2020-06-14 MED ORDER — METOPROLOL TARTRATE 25 MG PO TABS: 25.0000 mg | ORAL_TABLET | Freq: Two times a day (BID) | ORAL | Status: DC

## 2020-06-14 MED ORDER — METOPROLOL TARTRATE 12.5 MG HALF TABLET
12.5000 mg | ORAL_TABLET | Freq: Two times a day (BID) | ORAL | Status: DC
  Administered 2020-06-14: 12.5 mg via ORAL
  Filled 2020-06-14: qty 1

## 2020-06-14 NOTE — Progress Notes (Signed)
STROKE TEAM PROGRESS NOTE   HISTORY OF PRESENT ILLNESS (per record) Lauren [name] is an 75 y.o. female with a PMHx of breast CA s/p bilateral mastectomies, HLD (statin intolerant), elevated A1c, osteopenia, reactive depression, Sjogren's syndrome, urinary incontinence and vitamin D deficiency who presents to the ED from home via EMS as a Code Stroke for acute onset of slurred speech, trouble reading and left facial droop. LKN was 11 PM yesterday, before going to bed. On awakening at 0920 she was texting someone and noticed that she was having difficulty reading the text messages. Daughter then noticed that the patient had facial asymmetry with the left side of her face appearing to contract more than the right. EMS was called and on arrival they also noted a facial droop as well as confusion and slurred speech. Vitals per EMS: BP 160/100, CBG 142, HR 65, RR 18, O2 sat 99% on RA.   On arrival to the ED, the patient is anxious with an ataxic dysarthria but intact speech fluency and comprehension.   LSN: 11 PM tPA Given: No: Out of the time window mRS: 0   INTERVAL HISTORY The patient's daughters at the bedside. Patient lives by herself. She appears to have had transit difficulty speaking some right leg weakness. She reports that she thinks she is back to baseline at this time.    OBJECTIVE Vitals:   06/14/20 0406 06/14/20 0726 06/14/20 1300 06/14/20 1604  BP: 139/88 125/88 126/87 117/73  Pulse: 97 100 (!) 109 89  Resp: 18 20 16 18   Temp: 98.2 F (36.8 C) 98 F (36.7 C) 97.9 F (36.6 C)   TempSrc: Oral Oral Oral   SpO2: 95% 93% 94% 94%  Weight:      Height:        CBC:  Recent Labs  Lab 06/13/20 1356 06/13/20 1403  WBC 10.6*  --   NEUTROABS 7.8*  --   HGB 13.9 14.3  HCT 43.4 42.0  MCV 88.4  --   PLT 194  --     Basic Metabolic Panel:  Recent Labs  Lab 06/13/20 1356 06/13/20 1403  NA 138 141  K 3.7 3.8  CL 106 106  CO2 24  --   GLUCOSE 141* 140*  BUN 11 12   CREATININE 0.73 0.60  CALCIUM 8.7*  --     Lipid Panel:     Component Value Date/Time   CHOL 193 06/14/2020 0301   TRIG 91 06/14/2020 0301   HDL 51 06/14/2020 0301   CHOLHDL 3.8 06/14/2020 0301   VLDL 18 06/14/2020 0301   LDLCALC 124 (H) 06/14/2020 0301   HgbA1c:  Lab Results  Component Value Date   HGBA1C 6.1 (H) 06/14/2020   Urine Drug Screen: No results found for: LABOPIA, COCAINSCRNUR, LABBENZ, AMPHETMU, THCU, LABBARB  Alcohol Level No results found for: ETH  IMAGING   MR ANGIO HEAD WO CONTRAST  Result Date: 06/13/2020 CLINICAL DATA:  Stroke follow-up. Expressive aphasia and dysarthria. EXAM: MRI HEAD WITHOUT CONTRAST MRA HEAD WITHOUT CONTRAST TECHNIQUE: Multiplanar, multiecho pulse sequences of the brain and surrounding structures were obtained without intravenous contrast. Angiographic images of the head were obtained using MRA technique without contrast. COMPARISON:  None. FINDINGS: MRI HEAD FINDINGS Brain: Abnormal diffusion restriction within the left corona radiata and subcortical left parietal lobe. No acute or chronic hemorrhage. There is multifocal hyperintense T2-weighted signal within the white matter. Parenchymal volume and CSF spaces are normal. The midline structures are normal. Vascular: Major flow voids are  preserved. Skull and upper cervical spine: Normal calvarium and skull base. Visualized upper cervical spine and soft tissues are normal. Sinuses/Orbits:No paranasal sinus fluid levels or advanced mucosal thickening. No mastoid or middle ear effusion. Normal orbits. MRA HEAD FINDINGS POSTERIOR CIRCULATION: --Vertebral arteries: Normal --Inferior cerebellar arteries: Normal. --Basilar artery: Normal. --Superior cerebellar arteries: Normal. --Posterior cerebral arteries: Normal. The right PCA is predominantly supplied by the posterior communicating artery. ANTERIOR CIRCULATION: --Intracranial internal carotid arteries: Normal. --Anterior cerebral arteries (ACA):  Normal. --Middle cerebral arteries (MCA): Normal. ANATOMIC VARIANTS: None IMPRESSION: 1. Acute infarct of the left corona radiata and subcortical left parietal lobe. No hemorrhage or mass effect. 2. Normal intracranial MRA. Electronically Signed   By: Ulyses Jarred M.D.   On: 06/13/2020 22:26   MR BRAIN WO CONTRAST  Result Date: 06/13/2020 CLINICAL DATA:  Stroke follow-up. Expressive aphasia and dysarthria. EXAM: MRI HEAD WITHOUT CONTRAST MRA HEAD WITHOUT CONTRAST TECHNIQUE: Multiplanar, multiecho pulse sequences of the brain and surrounding structures were obtained without intravenous contrast. Angiographic images of the head were obtained using MRA technique without contrast. COMPARISON:  None. FINDINGS: MRI HEAD FINDINGS Brain: Abnormal diffusion restriction within the left corona radiata and subcortical left parietal lobe. No acute or chronic hemorrhage. There is multifocal hyperintense T2-weighted signal within the white matter. Parenchymal volume and CSF spaces are normal. The midline structures are normal. Vascular: Major flow voids are preserved. Skull and upper cervical spine: Normal calvarium and skull base. Visualized upper cervical spine and soft tissues are normal. Sinuses/Orbits:No paranasal sinus fluid levels or advanced mucosal thickening. No mastoid or middle ear effusion. Normal orbits. MRA HEAD FINDINGS POSTERIOR CIRCULATION: --Vertebral arteries: Normal --Inferior cerebellar arteries: Normal. --Basilar artery: Normal. --Superior cerebellar arteries: Normal. --Posterior cerebral arteries: Normal. The right PCA is predominantly supplied by the posterior communicating artery. ANTERIOR CIRCULATION: --Intracranial internal carotid arteries: Normal. --Anterior cerebral arteries (ACA): Normal. --Middle cerebral arteries (MCA): Normal. ANATOMIC VARIANTS: None IMPRESSION: 1. Acute infarct of the left corona radiata and subcortical left parietal lobe. No hemorrhage or mass effect. 2. Normal intracranial  MRA. Electronically Signed   By: Ulyses Jarred M.D.   On: 06/13/2020 22:26   ECHOCARDIOGRAM COMPLETE  Result Date: 06/14/2020    ECHOCARDIOGRAM REPORT   Patient Name:   Lauren [name] Date of Exam: 06/14/2020 Medical Rec #:  [id number]      Height:       68.0 in Accession #:    [id number]     Weight:       257.9 lb Date of Birth:  December 09, 1945       BSA:          2.277 m Patient Age:    35 years       BP:           125/88 mmHg Patient Gender: F              HR:           113 bpm. Exam Location:  Inpatient Procedure: 2D Echo Indications:    stroke  History:        Patient has no prior history of Echocardiogram examinations.                 Sjogren's disease, Arrythmias:Atrial Fibrillation; Risk                 Factors:Dyslipidemia and Sleep Apnea.  Sonographer:    Johny Chess Referring Phys: [place]  1. Left ventricular ejection fraction, by estimation, is  55 to 60%. The left ventricle has normal function. The left ventricle has no regional wall motion abnormalities. Left ventricular diastolic function could not be evaluated.  2. Right ventricular systolic function is normal. The right ventricular size is normal. There is normal pulmonary artery systolic pressure.  3. Left atrial size was mildly dilated.  4. The mitral valve is normal in structure. No evidence of mitral valve regurgitation. No evidence of mitral stenosis.  5. The aortic valve is normal in structure. Aortic valve regurgitation is not visualized. No aortic stenosis is present.  6. The inferior vena cava is normal in size with greater than 50% respiratory variability, suggesting right atrial pressure of 3 mmHg. FINDINGS  Left Ventricle: Left ventricular ejection fraction, by estimation, is 55 to 60%. The left ventricle has normal function. The left ventricle has no regional wall motion abnormalities. The left ventricular internal cavity size was normal in size. There is  no left ventricular hypertrophy. Left ventricular  diastolic function could not be evaluated due to atrial fibrillation. Left ventricular diastolic function could not be evaluated. Right Ventricle: The right ventricular size is normal. No increase in right ventricular wall thickness. Right ventricular systolic function is normal. There is normal pulmonary artery systolic pressure. The tricuspid regurgitant velocity is 2.62 m/s, and  with an assumed right atrial pressure of 3 mmHg, the estimated right ventricular systolic pressure is 52.7 mmHg. Left Atrium: Left atrial size was mildly dilated. Right Atrium: Right atrial size was normal in size. Pericardium: There is no evidence of pericardial effusion. Mitral Valve: The mitral valve is normal in structure. No evidence of mitral valve regurgitation. No evidence of mitral valve stenosis. Tricuspid Valve: The tricuspid valve is normal in structure. Tricuspid valve regurgitation is trivial. No evidence of tricuspid stenosis. Aortic Valve: The aortic valve is normal in structure. Aortic valve regurgitation is not visualized. No aortic stenosis is present. Pulmonic Valve: The pulmonic valve was normal in structure. Pulmonic valve regurgitation is trivial. No evidence of pulmonic stenosis. Aorta: The aortic root is normal in size and structure. Venous: The inferior vena cava is normal in size with greater than 50% respiratory variability, suggesting right atrial pressure of 3 mmHg. IAS/Shunts: No atrial level shunt detected by color flow Doppler.  LEFT VENTRICLE PLAX 2D LVIDd:         3.90 cm LVIDs:         2.70 cm LV PW:         1.00 cm LV IVS:        1.10 cm LVOT diam:     1.90 cm LVOT Area:     2.84 cm  IVC IVC diam: 1.50 cm LEFT ATRIUM             Index       RIGHT ATRIUM           Index LA diam:        3.90 cm 1.71 cm/m  RA Area:     17.10 cm LA Vol (A2C):   56.9 ml 24.99 ml/m RA Volume:   39.80 ml  17.48 ml/m LA Vol (A4C):   47.8 ml 20.99 ml/m LA Biplane Vol: 52.4 ml 23.01 ml/m   AORTA Ao Root diam: 3.30 cm Ao  Asc diam:  3.70 cm TRICUSPID VALVE TR Peak grad:   27.5 mmHg TR Vmax:        262.00 cm/s  SHUNTS Systemic Diam: 1.90 cm Dani Gobble Croitoru MD Electronically signed by Sanda Klein MD Signature Date/Time:  06/14/2020/1:11:19 PM    Final    CT HEAD CODE STROKE WO CONTRAST  Result Date: 06/13/2020 CLINICAL DATA:  Code stroke. Neuro deficit, acute, stroke suspected. Acute onset of left facial droop. Last seen normal at 11 p.m. last night. EXAM: CT HEAD WITHOUT CONTRAST TECHNIQUE: Contiguous axial images were obtained from the base of the skull through the vertex without intravenous contrast. COMPARISON:  None. FINDINGS: Brain: No acute infarct, hemorrhage, or mass lesion is present. Mild atrophy and white matter changes are noted bilaterally. The basal ganglia are intact. Insular ribbon is normal. No acute or focal cortical abnormalities are present. The ventricles are of normal size. No significant extraaxial fluid collection is present. The brainstem and cerebellum are within normal limits. Vascular: Atherosclerotic calcifications are present within the cavernous internal carotid arteries bilaterally. No hyperdense vessel is present. Skull: Calvarium is intact. No focal lytic or blastic lesions are present. No significant extracranial soft tissue lesion is present. Sinuses/Orbits: The paranasal sinuses and mastoid air cells are clear. Bilateral lens replacements are noted. Globes and orbits are otherwise unremarkable. ASPECTS [facility] Stroke Program Early CT Score) - Ganglionic level infarction (caudate, lentiform nuclei, internal capsule, insula, M1-M3 cortex): 7/7 - Supraganglionic infarction (M4-M6 cortex): 3/3 Total score (0-10 with 10 being normal): 10/10 IMPRESSION: 1. No acute intracranial abnormality. 2. Normal CT appearance of the brain for age. 3. ASPECTS is 10/10. The above was relayed via text pager to Dr. Kerney Elbe on 06/13/2020 at 14:10 . Electronically Signed   By: San Morelle M.D.   On:  06/13/2020 14:10   VAS US CAROTID (at [facility] and [facility] only)  Result Date: 06/13/2020 Carotid Arterial Duplex Study Patient Name:  HARLA MENSCH  Date of Exam:   06/13/2020 Medical Rec #: [id number]       Accession #:    [id number] Date of Birth: 09-18-1945        Patient Gender: F Patient Age:   77Y Exam Location:  [facility] Procedure:      VAS US CAROTID Referring Phys: [id number] JENNIFER YATES --------------------------------------------------------------------------------  Indications:       CVA. Comparison Study:  No prior study Performing Technologist: Maudry Mayhew MHA, RDMS, RVT, RDCS  Examination Guidelines: A complete evaluation includes B-mode imaging, spectral Doppler, color Doppler, and power Doppler as needed of all accessible portions of each vessel. Bilateral testing is considered an integral part of a complete examination. Limited examinations for reoccurring indications may be performed as noted.  Right Carotid Findings: +----------+--------+--------+--------+--------------------------+--------+           PSV cm/sEDV cm/sStenosisPlaque Description        Comments +----------+--------+--------+--------+--------------------------+--------+ CCA Prox  85      21                                                 +----------+--------+--------+--------+--------------------------+--------+ CCA Distal68      16              smooth and heterogenous            +----------+--------+--------+--------+--------------------------+--------+ ICA Prox  31      8               heterogenous and irregular         +----------+--------+--------+--------+--------------------------+--------+ ICA Distal76      26                                                 +----------+--------+--------+--------+--------------------------+--------+  ECA       57      12                                                 +----------+--------+--------+--------+--------------------------+--------+  +----------+--------+-------+----------------+-------------------+           PSV cm/sEDV cmsDescribe        Arm Pressure (mmHG) +----------+--------+-------+----------------+-------------------+ [id number]            Multiphasic, WNL                    +----------+--------+-------+----------------+-------------------+ +---------+--------+--+--------+--+---------+ VertebralPSV cm/s44EDV cm/s14Antegrade +---------+--------+--+--------+--+---------+  Left Carotid Findings: +----------+--------+--------+--------+------------------+------------------+           PSV cm/sEDV cm/sStenosisPlaque DescriptionComments           +----------+--------+--------+--------+------------------+------------------+ CCA Prox  108     21                                                   +----------+--------+--------+--------+------------------+------------------+ CCA Distal76      22                                intimal thickening +----------+--------+--------+--------+------------------+------------------+ ICA Prox  36      7                                                    +----------+--------+--------+--------+------------------+------------------+ ICA Distal51      16                                                   +----------+--------+--------+--------+------------------+------------------+ ECA       45      8                                                    +----------+--------+--------+--------+------------------+------------------+ +----------+--------+--------+----------------+-------------------+           PSV cm/sEDV cm/sDescribe        Arm Pressure (mmHG) +----------+--------+--------+----------------+-------------------+ Subclavian130             Multiphasic, WNL                    +----------+--------+--------+----------------+-------------------+ +---------+--------+--+--------+--+---------+ VertebralPSV cm/s54EDV cm/s18Antegrade  +---------+--------+--+--------+--+---------+   Summary: Right Carotid: Velocities in the right ICA are consistent with a 1-39% stenosis. Left Carotid: Velocities in the left ICA are consistent with a 1-39% stenosis. Vertebrals:  Bilateral vertebral arteries demonstrate antegrade flow. Subclavians: Normal flow hemodynamics were seen in bilateral subclavian              arteries. *See table(s) above for measurements and observations.     Preliminary     ECG - atrial flutter - ventricular response 113 BPM (See cardiology  reading for complete details)   PHYSICAL EXAM Blood pressure 117/73, pulse 89, temperature 97.9 F (36.6 C), temperature source Oral, resp. rate 18, height 5\' 8"  (1.727 m), weight 117 kg, SpO2 94 %. GENERAL:  She is doing well at this time.  HEENT:  Normal  EXTREMITIES: No edema   SKIN: Normal by inspection.    MENTAL STATUS: Alert and oriented. Speech, language and cognition are generally intact. Judgment and insight normal.   CRANIAL NERVES: Pupils are equal, round and reactive to light and accomodation; extra ocular movements are full, there is no significant nystagmus; visual fields are full; upper and lower facial muscles are normal in strength and symmetric, there is no flattening of the nasolabial folds; tongue is midline; uvula is midline; shoulder elevation is normal.  MOTOR: Normal tone, bulk and strength; no pronator drift.  COORDINATION: Left finger to nose is normal, right finger to nose is normal, No rest tremor; no intention tremor; no postural tremor; no bradykinesia.  SENSATION: Normal to light touch        ASSESSMENT/PLAN Ms. Kahlen Dedmon is a 75 y.o. female with history of breast CA s/p bilateral mastectomies, HLD (statin intolerant), elevated A1c, osteopenia, reactive depression, Sjogren's syndrome, urinary incontinence and vitamin D deficiency who presents to the ED from home via EMS as a Code Stroke for acute onset of slurred speech, trouble  reading and left facial droop. She did not receive IV t-PA due to late presentation (>4.5 hours from time of onset).  Stroke: Acute infarct of the left corona radiata and subcortical left parietal lobe - embolic - atrial fibrillation  Resultant   Temporary dysarthria /aphasia but back to normal now  Code Stroke CT Head -  No acute intracranial abnormality. Normal CT appearance of the brain for age. ASPECTS is 10/10.    CT head - not ordered  MRI head - Acute infarct of the left corona radiata and subcortical left parietal lobe. No hemorrhage or mass effect.   MRA head - Normal intracranial MRA.  CTA H&N - not ordered  CT Perfusion - not ordered  Carotid Doppler - unremarkable  2D Echo - EF 55 - 60%. No cardiac source of emboli identified.   Sars Corona Virus 2 - negative  LDL - 124  HgbA1c - 6.1  UDS - not ordered  VTE prophylaxis - Eliquis Diet  Diet Order            Diet heart healthy/carb modified Room service appropriate? Yes; Fluid consistency: Thin  Diet effective ____                 No antithrombotic prior to admission, now on Eliquis (apixaban) daily  Patient will be counseled to be compliant with her antithrombotic medications  Ongoing aggressive stroke risk factor management  Therapy recommendations:  pending  Disposition:  Pending  Hypertension  Home BP meds: none  Current BP meds: metoprolol  Stable . Permissive hypertension (OK if < 220/120) but gradually normalize in 5-7 days  . Long-term BP goal normotensive  Hyperlipidemia  Home Lipid lowering medication: Zetia  LDL 124, goal < 70  Current lipid lowering medication: Zetia  Continue statin at discharge  Other Stroke Risk Factors  Advanced age  Obesity, Body mass index is 39.22 kg/m., recommend weight loss, diet and exercise as appropriate   Family hx stroke (Mat. Grandmother)   Other Active Problems, Findings, Recommendations and/or Plan  Code status - DNR  Atrial  flutter/  Atrial fibrillation with rapid  ventricular response started on metoprolol  Glucose intolerance   Hospital day # 1    To contact Stroke Continuity provider, please refer to [email or web address] After hours, contact General Neurology

## 2020-06-14 NOTE — Progress Notes (Signed)
  Echocardiogram 2D Echocardiogram has been performed.  Lauren [name] 06/14/2020, 12:09 PM

## 2020-06-14 NOTE — Evaluation (Signed)
Physical Therapy Evaluation Patient Details Name: Lauren [name] MRN: [id number] DOB: 13-Apr-1945 Today's Date: 06/14/2020   History of Present Illness  Pt is 75 yo female who presents with expressive aphasia, dysarthria, inability to read, L sided tingling. MRI: acute CVA of the L corona radiata and subcortical L parietal lobe. PMH: new a-flutter, LLD, pre DM, Sjogren's, depression, OSA.  Clinical Impression  Pt admitted with above diagnosis. Pt presents with mild expressive language deficits and mild balance impairment and coordination deficits. Pt with decreased gait speed and unsteadiness with changes in direction. She denies tingling in extremities at this time except that she has carpal tunnel bilaterally. She was having difficulty reading yesterday but that seems to have cleared. Would benefit from HHPT for balance program.  Pt currently with functional limitations due to the deficits listed below (see PT Problem List). Pt will benefit from skilled PT to increase their independence and safety with mobility to allow discharge to the venue listed below.       Follow Up Recommendations Home health PT;Supervision - Intermittent    Equipment Recommendations  None recommended by PT    Recommendations for Other Services OT consult;Speech consult     Precautions / Restrictions Precautions Precautions: Fall Restrictions Weight Bearing Restrictions: No      Mobility  Bed Mobility Overal bed mobility: Modified Independent             General bed mobility comments: pt able to remove covers and come to EOB with increased time but physical assist needed    Transfers Overall transfer level: Needs assistance Equipment used: Rolling walker (2 wheeled) Transfers: Sit to/from Stand Sit to Stand: Min guard         General transfer comment: cues to pause before walking due to metoprolol being started today but pt denied dizziness at 1 min  Ambulation/Gait Ambulation/Gait assistance:  Min guard Gait Distance (Feet): 60 Feet Assistive device: None Gait Pattern/deviations: Step-through pattern;Staggering left Gait velocity: decreased Gait velocity interpretation: 1.31 - 2.62 ft/sec, indicative of limited community ambulator General Gait Details: mild LOB to L with turning, self corrected.  Stairs            Wheelchair Mobility    Modified Rankin (Stroke Patients Only) Modified Rankin (Stroke Patients Only) Pre-Morbid Rankin Score: Slight disability Modified Rankin: Moderate disability     Balance Overall balance assessment: Needs assistance Sitting-balance support: Feet supported;No upper extremity supported Sitting balance-Leahy Scale: Good     Standing balance support: No upper extremity supported;During functional activity Standing balance-Leahy Scale: Fair Standing balance comment: pt able to perform dynamic activities within BOS with safety, needs UE support for moving out of BOS                             Pertinent Vitals/Pain Pain Assessment: No/denies pain    Home Living Family/patient expects to be discharged to:: Private residence Living Arrangements: Alone Available Help at Discharge: Family;Available PRN/intermittently Type of Home: House Home Access: Level entry     Home Layout: One level Home Equipment: Walker - 2 wheels      Prior Function Level of Independence: Independent         Comments: drives, retired Engineer, technical sales [employer]   Dominant Hand: Right    Extremity/Trunk Assessment   Upper Extremity Assessment Upper Extremity Assessment: Defer to OT evaluation;RUE deficits/detail RUE Deficits / Details: delayed finger nose but pt reports it is better  today than yesterday RUE Coordination: decreased gross motor    Lower Extremity Assessment Lower Extremity Assessment: Generalized weakness    Cervical / Trunk Assessment Cervical / Trunk Assessment: Normal  Communication   Communication:  Expressive difficulties  Cognition Arousal/Alertness: Awake/alert Behavior During Therapy: WFL for tasks assessed/performed Overall Cognitive Status: Within Functional Limits for tasks assessed                                 General Comments: pt with good awareness of deficits and memory appears intact      General Comments General comments (skin integrity, edema, etc.): pt has a heated pool at her home and mentions doing HH to be able to utilize that in therapy. Agree with this since she does not have regular transportation    Exercises     Assessment/Plan    PT Assessment Patient needs continued PT services  PT Problem List Decreased balance;Decreased mobility;Decreased coordination;Decreased knowledge of precautions;Obesity       PT Treatment Interventions DME instruction;Gait training;Stair training;Functional mobility training;Therapeutic activities;Therapeutic exercise;Balance training;Cognitive remediation;Patient/family education;Neuromuscular re-education    PT Goals (Current goals can be found in the Care Plan section)  Acute Rehab PT Goals Patient Stated Goal: return home PT Goal Formulation: With patient Time For Goal Achievement: 06/28/20 Potential to Achieve Goals: Good    Frequency Min 4X/week   Barriers to discharge Decreased caregiver support lives alone    Co-evaluation               AM-PAC PT "6 Clicks" Mobility  Outcome Measure Help needed turning from your back to your side while in a flat bed without using bedrails?: None Help needed moving from lying on your back to sitting on the side of a flat bed without using bedrails?: None Help needed moving to and from a bed to a chair (including a wheelchair)?: A Little Help needed standing up from a chair using your arms (e.g., wheelchair or bedside chair)?: A Little Help needed to walk in hospital room?: A Little Help needed climbing 3-5 steps with a railing? : A Little 6 Click  Score: 20    End of Session Equipment Utilized During Treatment: Gait belt Activity Tolerance: Patient tolerated treatment well Patient left: in chair;with call bell/phone within reach;with chair alarm set Nurse Communication: Mobility status PT Visit Diagnosis: Unsteadiness on feet (R26.81)    Time: 0102-7253 PT Time Calculation (min) (ACUTE ONLY): 38 min   Charges:   PT Evaluation $PT Eval Moderate Complexity: 1 Mod PT Treatments $Gait Training: 8-22 mins $Therapeutic Activity: 8-22 mins        Leighton Roach, PT  Acute Rehab Services  Pager [phone] Office [place] 06/14/2020, 4:48 PM

## 2020-06-14 NOTE — Plan of Care (Signed)
  Problem: Education: Goal: Knowledge of disease or condition will improve Outcome: Progressing Goal: Knowledge of secondary prevention will improve Outcome: Progressing Goal: Knowledge of patient specific risk factors addressed and post discharge goals established will improve Outcome: Progressing   

## 2020-06-14 NOTE — Progress Notes (Signed)
PROGRESS NOTE    Lauren [name]  HBZ:[id number] DOB: [birth year] DOA: 06/13/2020 PCP: Abner Greenspan, MD   Chief Complaint  Patient presents with  . Code Stroke    Brief Narrative:    Lauren [name] is a 75 y.o. female with medical history significant of Sjogren's; HLD; OSA on CPAP; obesity; and breast cancer presenting with neurologic symptoms concerning for stroke.  She woke up about 920 and realized that she was unable to read her text messages. Her speech was different. She was unable to read her Bible. She feels like it was her brain, not her vision - like the letters were backwards. She had expressive aphasia, also some dysarthria. No motor symptoms but some tingling in the left foot and side - all the way up and down. No dysphagia. No headache. No h/o stroke.  She does not take ASA daily.  Her work-up was significant for new onset A. fib/flutter, and acute CVA on MRI.    Assessment & Plan:   Principal Problem:   Acute CVA (cerebrovascular accident) ([place]) Active Problems:   Hyperlipidemia   Prediabetes   OSA (obstructive sleep apnea)   Sjogren's disease (HCC)   Obesity (BMI 30-39.9)   DNR (do not resuscitate)    CVA -RI brain significant for acute infarct of the left corona radiata, and subcortical left parietal lobe, normal intracranial MRA, no significant occlusion in carotid or vertebral arteries, with a preserved EF, no significant valvular disease. -PT/OT/SLP consulted . -Neurology input greatly appreciated, she was started on Eliquis.  New onset aflutter -Patient with apparent discussion about afib during pulm appt in March, told to f/u with PCP -She remains in A. fib, heart rate is elevated, started on low-dose metoprolol, remains uncontrolled, so I will increase metoprolol to 25 mg p.o. twice daily, will give 1 dose of digoxin avoid significant drop of blood pressure on beta-blockers. -Darted on Eliquis  HTN -Allow permissive HTNfor now -Treat BP  only if >220/120, and then with goal of 15% reduction -She does not take home medications for this issue -Monitor closely as she started metoprolol for heart rate control  HLD -Reported statin intolerance -Continue Zetia for now  Pre-DM -Prior A1c shows reasonably good control, 6.1 in 02/2020 -Will order moderate-scale SSI  Sjogren's -Continue pilocarpine  Depression -Continue Prozac  Obesity -Body mass index is 39.22 kg/m..  -Weight loss should be encouraged -Outpatient PCP/bariatric medicine f/u encouraged  OSA -Resume CPAP at the time of d/c    DVT prophylaxis: Eliquis Code Status: DNR  Family Communication: Daughter at bedside Disposition:   Status is: Inpatient  Remains inpatient appropriate because:Ongoing diagnostic testing needed not appropriate for outpatient work up and IV treatments appropriate due to intensity of illness or inability to take PO   Dispo: The patient is from: Home              Anticipated d/c is to: Home              Patient currently is not medically stable to d/c.  Further up titration of cardiac medications and some use of IV meds including digoxin   Difficult to place patient No       Consultants:   Neuro     Subjective:  She is feeling some palpitation, reports speech has significantly improved, she denies any new focal deficits  Objective: Vitals:   06/14/20 0357 06/14/20 0406 06/14/20 0726 06/14/20 1300  BP:  139/88 125/88 126/87  Pulse:  97 100 (!) 109  Resp:  18 20 16   Temp: 98.2 F (36.8 C) 98.2 F (36.8 C) 98 F (36.7 C) 97.9 F (36.6 C)  TempSrc: Oral Oral Oral Oral  SpO2:  95% 93% 94%  Weight:      Height:        Intake/Output Summary (Last 24 hours) at 06/14/2020 1429 Last data filed at 06/14/2020 1000 Gross per 24 hour  Intake 182.53 ml  Output 2300 ml  Net -2117.47 ml   Filed Weights   06/13/20 1300 06/13/20 1418  Weight: 116.8 kg 117 kg    Examination:  General exam: Appears calm  and comfortable, she remains with minimal left facial droop, speech has significantly improved Respiratory system: Clear to auscultation. Respiratory effort normal. Cardiovascular system: S1 & S2 heard, RRR. No JVD, murmurs, rubs, gallops or clicks. No pedal edema. Gastrointestinal system: Abdomen is nondistended, soft and nontender. No organomegaly or masses felt. Normal bowel sounds heard. Central nervous system: Alert and oriented. No focal neurological deficits. Extremities: Symmetric 5 x 5 power. Skin: No rashes, lesions or ulcers Psychiatry: Judgement and insight appear normal. Mood & affect appropriate.     Data Reviewed: I have personally reviewed following labs and imaging studies  CBC: Recent Labs  Lab 06/13/20 1356 06/13/20 1403  WBC 10.6*  --   NEUTROABS 7.8*  --   HGB 13.9 14.3  HCT 43.4 42.0  MCV 88.4  --   PLT 194  --     Basic Metabolic Panel: Recent Labs  Lab 06/13/20 1356 06/13/20 1403  NA 138 141  K 3.7 3.8  CL 106 106  CO2 24  --   GLUCOSE 141* 140*  BUN 11 12  CREATININE 0.73 0.60  CALCIUM 8.7*  --     GFR: Estimated Creatinine Clearance: 81.6 mL/min (by C-G formula based on SCr of 0.6 mg/dL).  Liver Function Tests: Recent Labs  Lab 06/13/20 1356  AST 20  ALT 16  ALKPHOS 68  BILITOT 0.5  PROT 6.7  ALBUMIN 3.6    CBG: Recent Labs  Lab 06/13/20 1706 06/13/20 2104 06/14/20 0652 06/14/20 1304  GLUCAP 126* 120* 124* 109*     Recent Results (from the past 240 hour(s))  SARS CORONAVIRUS 2 (TAT 6-24 HRS) Nasopharyngeal Nasopharyngeal Swab     Status: None   Collection Time: 06/13/20  5:20 PM   Specimen: Nasopharyngeal Swab  Result Value Ref Range Status   SARS Coronavirus 2 NEGATIVE NEGATIVE Final    Comment: (NOTE) SARS-CoV-2 target nucleic acids are NOT DETECTED.  The SARS-CoV-2 RNA is generally detectable in upper and lower respiratory specimens during the acute phase of infection. Negative results do not preclude  SARS-CoV-2 infection, do not rule out co-infections with other pathogens, and should not be used as the sole basis for treatment or other patient management decisions. Negative results must be combined with clinical observations, patient history, and epidemiological information. The expected result is Negative.  Fact Sheet for Patients: SugarRoll.be  Fact Sheet for Healthcare Providers: https://www.woods-mathews.com/  This test is not yet approved or cleared by the [place] FDA and  has been authorized for detection and/or diagnosis of SARS-CoV-2 by FDA under an Emergency Use Authorization (EUA). This EUA will remain  in effect (meaning this test can be used) for the duration of the COVID-19 declaration under Se ction 564(b)(1) of the Act, 21 U.S.C. section 360bbb-3(b)(1), unless the authorization is terminated or revoked sooner.  Performed at [facility], [address] [place],  [place] [address]          Radiology Studies: MR ANGIO HEAD WO CONTRAST  Result Date: 06/13/2020 CLINICAL DATA:  Stroke follow-up. Expressive aphasia and dysarthria. EXAM: MRI HEAD WITHOUT CONTRAST MRA HEAD WITHOUT CONTRAST TECHNIQUE: Multiplanar, multiecho pulse sequences of the brain and surrounding structures were obtained without intravenous contrast. Angiographic images of the head were obtained using MRA technique without contrast. COMPARISON:  None. FINDINGS: MRI HEAD FINDINGS Brain: Abnormal diffusion restriction within the left corona radiata and subcortical left parietal lobe. No acute or chronic hemorrhage. There is multifocal hyperintense T2-weighted signal within the white matter. Parenchymal volume and CSF spaces are normal. The midline structures are normal. Vascular: Major flow voids are preserved. Skull and upper cervical spine: Normal calvarium and skull base. Visualized upper cervical spine and soft tissues are normal. Sinuses/Orbits:No  paranasal sinus fluid levels or advanced mucosal thickening. No mastoid or middle ear effusion. Normal orbits. MRA HEAD FINDINGS POSTERIOR CIRCULATION: --Vertebral arteries: Normal --Inferior cerebellar arteries: Normal. --Basilar artery: Normal. --Superior cerebellar arteries: Normal. --Posterior cerebral arteries: Normal. The right PCA is predominantly supplied by the posterior communicating artery. ANTERIOR CIRCULATION: --Intracranial internal carotid arteries: Normal. --Anterior cerebral arteries (ACA): Normal. --Middle cerebral arteries (MCA): Normal. ANATOMIC VARIANTS: None IMPRESSION: 1. Acute infarct of the left corona radiata and subcortical left parietal lobe. No hemorrhage or mass effect. 2. Normal intracranial MRA. Electronically Signed   By: Ulyses Jarred M.D.   On: 06/13/2020 22:26   MR BRAIN WO CONTRAST  Result Date: 06/13/2020 CLINICAL DATA:  Stroke follow-up. Expressive aphasia and dysarthria. EXAM: MRI HEAD WITHOUT CONTRAST MRA HEAD WITHOUT CONTRAST TECHNIQUE: Multiplanar, multiecho pulse sequences of the brain and surrounding structures were obtained without intravenous contrast. Angiographic images of the head were obtained using MRA technique without contrast. COMPARISON:  None. FINDINGS: MRI HEAD FINDINGS Brain: Abnormal diffusion restriction within the left corona radiata and subcortical left parietal lobe. No acute or chronic hemorrhage. There is multifocal hyperintense T2-weighted signal within the white matter. Parenchymal volume and CSF spaces are normal. The midline structures are normal. Vascular: Major flow voids are preserved. Skull and upper cervical spine: Normal calvarium and skull base. Visualized upper cervical spine and soft tissues are normal. Sinuses/Orbits:No paranasal sinus fluid levels or advanced mucosal thickening. No mastoid or middle ear effusion. Normal orbits. MRA HEAD FINDINGS POSTERIOR CIRCULATION: --Vertebral arteries: Normal --Inferior cerebellar arteries:  Normal. --Basilar artery: Normal. --Superior cerebellar arteries: Normal. --Posterior cerebral arteries: Normal. The right PCA is predominantly supplied by the posterior communicating artery. ANTERIOR CIRCULATION: --Intracranial internal carotid arteries: Normal. --Anterior cerebral arteries (ACA): Normal. --Middle cerebral arteries (MCA): Normal. ANATOMIC VARIANTS: None IMPRESSION: 1. Acute infarct of the left corona radiata and subcortical left parietal lobe. No hemorrhage or mass effect. 2. Normal intracranial MRA. Electronically Signed   By: Ulyses Jarred M.D.   On: 06/13/2020 22:26   ECHOCARDIOGRAM COMPLETE  Result Date: 06/14/2020    ECHOCARDIOGRAM REPORT   Patient Name:   DANIYA HERMANCE Date of Exam: 06/14/2020 Medical Rec #:  [id number]      Height:       68.0 in Accession #:    [id number]     Weight:       257.9 lb Date of Birth:  1945/11/26       BSA:          2.277 m Patient Age:    31 years       BP:           125/88 mmHg  Patient Gender: F              HR:           113 bpm. Exam Location:  Inpatient Procedure: 2D Echo Indications:    stroke  History:        Patient has no prior history of Echocardiogram examinations.                 Sjogren's disease, Arrythmias:Atrial Fibrillation; Risk                 Factors:Dyslipidemia and Sleep Apnea.  Sonographer:    Johny Chess Referring Phys: [place]  1. Left ventricular ejection fraction, by estimation, is 55 to 60%. The left ventricle has normal function. The left ventricle has no regional wall motion abnormalities. Left ventricular diastolic function could not be evaluated.  2. Right ventricular systolic function is normal. The right ventricular size is normal. There is normal pulmonary artery systolic pressure.  3. Left atrial size was mildly dilated.  4. The mitral valve is normal in structure. No evidence of mitral valve regurgitation. No evidence of mitral stenosis.  5. The aortic valve is normal in structure. Aortic valve  regurgitation is not visualized. No aortic stenosis is present.  6. The inferior vena cava is normal in size with greater than 50% respiratory variability, suggesting right atrial pressure of 3 mmHg. FINDINGS  Left Ventricle: Left ventricular ejection fraction, by estimation, is 55 to 60%. The left ventricle has normal function. The left ventricle has no regional wall motion abnormalities. The left ventricular internal cavity size was normal in size. There is  no left ventricular hypertrophy. Left ventricular diastolic function could not be evaluated due to atrial fibrillation. Left ventricular diastolic function could not be evaluated. Right Ventricle: The right ventricular size is normal. No increase in right ventricular wall thickness. Right ventricular systolic function is normal. There is normal pulmonary artery systolic pressure. The tricuspid regurgitant velocity is 2.62 m/s, and  with an assumed right atrial pressure of 3 mmHg, the estimated right ventricular systolic pressure is [id number] mmHg. Left Atrium: Left atrial size was mildly dilated. Right Atrium: Right atrial size was normal in size. Pericardium: There is no evidence of pericardial effusion. Mitral Valve: The mitral valve is normal in structure. No evidence of mitral valve regurgitation. No evidence of mitral valve stenosis. Tricuspid Valve: The tricuspid valve is normal in structure. Tricuspid valve regurgitation is trivial. No evidence of tricuspid stenosis. Aortic Valve: The aortic valve is normal in structure. Aortic valve regurgitation is not visualized. No aortic stenosis is present. Pulmonic Valve: The pulmonic valve was normal in structure. Pulmonic valve regurgitation is trivial. No evidence of pulmonic stenosis. Aorta: The aortic root is normal in size and structure. Venous: The inferior vena cava is normal in size with greater than 50% respiratory variability, suggesting right atrial pressure of 3 mmHg. IAS/Shunts: No atrial level shunt  detected by color flow Doppler.  LEFT VENTRICLE PLAX 2D LVIDd:         3.90 cm LVIDs:         2.70 cm LV PW:         1.00 cm LV IVS:        1.10 cm LVOT diam:     1.90 cm LVOT Area:     2.84 cm  IVC IVC diam: 1.50 cm LEFT ATRIUM             Index  RIGHT ATRIUM           Index LA diam:        3.90 cm 1.71 cm/m  RA Area:     17.10 cm LA Vol (A2C):   56.9 ml 24.99 ml/m RA Volume:   39.80 ml  17.48 ml/m LA Vol (A4C):   47.8 ml 20.99 ml/m LA Biplane Vol: 52.4 ml 23.01 ml/m   AORTA Ao Root diam: 3.30 cm Ao Asc diam:  3.70 cm TRICUSPID VALVE TR Peak grad:   27.5 mmHg TR Vmax:        262.00 cm/s  SHUNTS Systemic Diam: 1.90 cm Dani Gobble Croitoru MD Electronically signed by Sanda Klein MD Signature Date/Time: 06/14/2020/1:11:19 PM    Final    CT HEAD CODE STROKE WO CONTRAST  Result Date: 06/13/2020 CLINICAL DATA:  Code stroke. Neuro deficit, acute, stroke suspected. Acute onset of left facial droop. Last seen normal at 11 p.m. last night. EXAM: CT HEAD WITHOUT CONTRAST TECHNIQUE: Contiguous axial images were obtained from the base of the skull through the vertex without intravenous contrast. COMPARISON:  None. FINDINGS: Brain: No acute infarct, hemorrhage, or mass lesion is present. Mild atrophy and white matter changes are noted bilaterally. The basal ganglia are intact. Insular ribbon is normal. No acute or focal cortical abnormalities are present. The ventricles are of normal size. No significant extraaxial fluid collection is present. The brainstem and cerebellum are within normal limits. Vascular: Atherosclerotic calcifications are present within the cavernous internal carotid arteries bilaterally. No hyperdense vessel is present. Skull: Calvarium is intact. No focal lytic or blastic lesions are present. No significant extracranial soft tissue lesion is present. Sinuses/Orbits: The paranasal sinuses and mastoid air cells are clear. Bilateral lens replacements are noted. Globes and orbits are otherwise  unremarkable. ASPECTS [facility] Stroke Program Early CT Score) - Ganglionic level infarction (caudate, lentiform nuclei, internal capsule, insula, M1-M3 cortex): 7/7 - Supraganglionic infarction (M4-M6 cortex): 3/3 Total score (0-10 with 10 being normal): 10/10 IMPRESSION: 1. No acute intracranial abnormality. 2. Normal CT appearance of the brain for age. 3. ASPECTS is 10/10. The above was relayed via text pager to Dr. Kerney Elbe on 06/13/2020 at 14:10 . Electronically Signed   By: San Morelle M.D.   On: 06/13/2020 14:10   VAS US CAROTID (at [facility] only)  Result Date: 06/13/2020 Carotid Arterial Duplex Study Patient Name:  DELISE HAMIDI  Date of Exam:   06/13/2020 Medical Rec #: [id number]       Accession #:    [id number] Date of Birth: March 30, 1945        Patient Gender: F Patient Age:   17Y Exam Location:  [facility] Procedure:      VAS US CAROTID Referring Phys: [id number] JENNIFER YATES --------------------------------------------------------------------------------  Indications:       CVA. Comparison Study:  No prior study Performing Technologist: Maudry Mayhew MHA, RDMS, RVT, RDCS  Examination Guidelines: A complete evaluation includes B-mode imaging, spectral Doppler, color Doppler, and power Doppler as needed of all accessible portions of each vessel. Bilateral testing is considered an integral part of a complete examination. Limited examinations for reoccurring indications may be performed as noted.  Right Carotid Findings: +----------+--------+--------+--------+--------------------------+--------+           PSV cm/sEDV cm/sStenosisPlaque Description        Comments +----------+--------+--------+--------+--------------------------+--------+ CCA Prox  85      21                                                 +----------+--------+--------+--------+--------------------------+--------+  CCA Distal68      16              smooth and heterogenous             +----------+--------+--------+--------+--------------------------+--------+ ICA Prox  31      8               heterogenous and irregular         +----------+--------+--------+--------+--------------------------+--------+ ICA Distal76      26                                                 +----------+--------+--------+--------+--------------------------+--------+ ECA       57      12                                                 +----------+--------+--------+--------+--------------------------+--------+ +----------+--------+-------+----------------+-------------------+           PSV cm/sEDV cmsDescribe        Arm Pressure (mmHG) +----------+--------+-------+----------------+-------------------+ [id number]            Multiphasic, WNL                    +----------+--------+-------+----------------+-------------------+ +---------+--------+--+--------+--+---------+ VertebralPSV cm/s44EDV cm/s14Antegrade +---------+--------+--+--------+--+---------+  Left Carotid Findings: +----------+--------+--------+--------+------------------+------------------+           PSV cm/sEDV cm/sStenosisPlaque DescriptionComments           +----------+--------+--------+--------+------------------+------------------+ CCA Prox  108     21                                                   +----------+--------+--------+--------+------------------+------------------+ CCA Distal76      22                                intimal thickening +----------+--------+--------+--------+------------------+------------------+ ICA Prox  36      7                                                    +----------+--------+--------+--------+------------------+------------------+ ICA Distal51      16                                                   +----------+--------+--------+--------+------------------+------------------+ ECA       45      8                                                     +----------+--------+--------+--------+------------------+------------------+ +----------+--------+--------+----------------+-------------------+           PSV cm/sEDV cm/sDescribe        Arm Pressure (  mmHG) +----------+--------+--------+----------------+-------------------+ Subclavian130             Multiphasic, WNL                    +----------+--------+--------+----------------+-------------------+ +---------+--------+--+--------+--+---------+ VertebralPSV cm/s54EDV cm/s18Antegrade +---------+--------+--+--------+--+---------+   Summary: Right Carotid: Velocities in the right ICA are consistent with a 1-39% stenosis. Left Carotid: Velocities in the left ICA are consistent with a 1-39% stenosis. Vertebrals:  Bilateral vertebral arteries demonstrate antegrade flow. Subclavians: Normal flow hemodynamics were seen in bilateral subclavian              arteries. *See table(s) above for measurements and observations.     Preliminary         Scheduled Meds: . apixaban  5 mg Oral BID  . digoxin  0.25 mg Intravenous Once  . ezetimibe  10 mg Oral Daily  . FLUoxetine  20 mg Oral Daily  . gabapentin  300 mg Oral BID  . insulin aspart  0-15 Units Subcutaneous TID WC  . loratadine  10 mg Oral Daily  . LORazepam  1 mg Intravenous Once  . metoprolol tartrate  12.5 mg Oral Once  . [START ON 06/15/2020] metoprolol tartrate  25 mg Oral BID  . [START ON 06/15/2020] oxybutynin  1 patch Transdermal Once per day on Mon Thu  . pantoprazole  40 mg Oral Daily  . pilocarpine  5 mg Oral TID   Continuous Infusions: . sodium chloride 50 mL/hr at 06/13/20 2025     LOS: 1 day     Phillips Climes, MD [facility]   To contact the attending provider between 7A-7P or the covering provider during after hours 7P-7A, please log into the web site [email or web address] and access using universal [place] password for that web site. If you do not have the password, please call the  hospital operator.  06/14/2020, 2:29 PM

## 2020-06-15 ENCOUNTER — Other Ambulatory Visit (HOSPITAL_COMMUNITY): Payer: Self-pay

## 2020-06-15 DIAGNOSIS — E7800 Pure hypercholesterolemia, unspecified: Secondary | ICD-10-CM | POA: Diagnosis not present

## 2020-06-15 DIAGNOSIS — M3500 Sicca syndrome, unspecified: Secondary | ICD-10-CM | POA: Diagnosis not present

## 2020-06-15 DIAGNOSIS — I639 Cerebral infarction, unspecified: Secondary | ICD-10-CM | POA: Diagnosis not present

## 2020-06-15 LAB — CBC
HCT: 44.4 % (ref 36.0–46.0)
Hemoglobin: 14.2 g/dL (ref 12.0–15.0)
MCH: 27.7 pg (ref 26.0–34.0)
MCHC: 32 g/dL (ref 30.0–36.0)
MCV: 86.7 fL (ref 80.0–100.0)
Platelets: 209 10*3/uL (ref 150–400)
RBC: 5.12 MIL/uL — ABNORMAL HIGH (ref 3.87–5.11)
RDW: 14.6 % (ref 11.5–15.5)
WBC: 9.4 10*3/uL (ref 4.0–10.5)
nRBC: 0 % (ref 0.0–0.2)

## 2020-06-15 LAB — BASIC METABOLIC PANEL
Anion gap: 8 (ref 5–15)
BUN: 13 mg/dL (ref 8–23)
CO2: 24 mmol/L (ref 22–32)
Calcium: 8.7 mg/dL — ABNORMAL LOW (ref 8.9–10.3)
Chloride: 105 mmol/L (ref 98–111)
Creatinine, Ser: 0.59 mg/dL (ref 0.44–1.00)
GFR, Estimated: >60 mL/min (ref >60)
Glucose, Bld: 134 mg/dL — ABNORMAL HIGH (ref 70–99)
Potassium: 3.9 mmol/L (ref 3.5–5.1)
Sodium: 137 mmol/L (ref 135–145)

## 2020-06-15 LAB — MAGNESIUM: Magnesium: 2 mg/dL (ref 1.7–2.4)

## 2020-06-15 MED ORDER — METOPROLOL TARTRATE 12.5 MG HALF TABLET
12.5000 mg | ORAL_TABLET | Freq: Two times a day (BID) | ORAL | Status: DC
  Administered 2020-06-15: 12.5 mg via ORAL

## 2020-06-15 MED ORDER — METOPROLOL TARTRATE 25 MG PO TABS
12.5000 mg | ORAL_TABLET | Freq: Two times a day (BID) | ORAL | 1 refills | Status: DC
  Filled 2020-06-15: qty 30, 30d supply, fill #0

## 2020-06-15 MED ORDER — APIXABAN 5 MG PO TABS
5.0000 mg | ORAL_TABLET | Freq: Two times a day (BID) | ORAL | 0 refills | Status: DC
  Filled 2020-06-15: qty 60, 30d supply, fill #0

## 2020-06-15 MED ORDER — OXYBUTYNIN 3.9 MG/24HR TD PTTW
1.0000 | MEDICATED_PATCH | TRANSDERMAL | Status: DC
  Filled 2020-06-15 (×2): qty 1

## 2020-06-15 NOTE — Progress Notes (Signed)
STROKE TEAM PROGRESS NOTE   HISTORY OF PRESENT ILLNESS (per record) Lauren [name] is an 75 y.o. female with a PMHx of breast CA s/p bilateral mastectomies, HLD (statin intolerant), elevated A1c, osteopenia, reactive depression, Sjogren's syndrome, urinary incontinence and vitamin D deficiency who presents to the ED from home via EMS as a Code Stroke for acute onset of slurred speech, trouble reading and left facial droop. LKN was 11 PM yesterday, before going to bed. On awakening at 0920 she was texting someone and noticed that she was having difficulty reading the text messages. Daughter then noticed that the patient had facial asymmetry with the left side of her face appearing to contract more than the right. EMS was called and on arrival they also noted a facial droop as well as confusion and slurred speech. Vitals per EMS: BP 160/100, CBG 142, HR 65, RR 18, O2 sat 99% on RA.   On arrival to the ED, the patient is anxious with an ataxic dysarthria but intact speech fluency and comprehension.   LSN: 11 PM tPA Given: No: Out of the time window mRS: 0   INTERVAL HISTORY The patient's daughter is at the bedside.  Patient states she is a lot improved though she is does have still some word finding difficulties.  MRI brain shows a left parietal punctate cortical as well as larger subcortical infarct.  MRI brain shows no large vessel stenosis.  Echocardiogram is unremarkable carotid ultrasound shows no significant extracranial stenosis.   OBJECTIVE Vitals:   06/15/20 0236 06/15/20 0501 06/15/20 0908 06/15/20 1134  BP: 140/76 118/77 (!) 143/97 128/73  Pulse: 81 75 97 89  Resp: (!) 24 15 18 17   Temp: 98.2 F (36.8 C) (!) 97.4 F (36.3 C) 97.9 F (36.6 C) 98.2 F (36.8 C)  TempSrc: Axillary Axillary Oral Oral  SpO2: 92% 96% 95% 95%  Weight:      Height:        CBC:  Recent Labs  Lab 06/13/20 1356 06/13/20 1403 06/15/20 0722  WBC 10.6*  --  9.4  NEUTROABS 7.8*  --   --   HGB 13.9  14.3 14.2  HCT 43.4 42.0 44.4  MCV 88.4  --  86.7  PLT 194  --  761    Basic Metabolic Panel:  Recent Labs  Lab 06/13/20 1356 06/13/20 1403 06/15/20 0722  NA 138 141 137  K 3.7 3.8 3.9  CL 106 106 105  CO2 24  --  24  GLUCOSE 141* 140* 134*  BUN 11 12 13   CREATININE 0.73 0.60 0.59  CALCIUM 8.7*  --  8.7*  MG  --   --  2.0    Lipid Panel:     Component Value Date/Time   CHOL 193 06/14/2020 0301   TRIG 91 06/14/2020 0301   HDL 51 06/14/2020 0301   CHOLHDL 3.8 06/14/2020 0301   VLDL 18 06/14/2020 0301   LDLCALC 124 (H) 06/14/2020 0301   HgbA1c:  Lab Results  Component Value Date   HGBA1C 6.1 (H) 06/14/2020   Urine Drug Screen: No results found for: LABOPIA, COCAINSCRNUR, LABBENZ, AMPHETMU, THCU, LABBARB  Alcohol Level No results found for: ETH  IMAGING   MR ANGIO HEAD WO CONTRAST  Result Date: 06/13/2020 CLINICAL DATA:  Stroke follow-up. Expressive aphasia and dysarthria. EXAM: MRI HEAD WITHOUT CONTRAST MRA HEAD WITHOUT CONTRAST TECHNIQUE: Multiplanar, multiecho pulse sequences of the brain and surrounding structures were obtained without intravenous contrast. Angiographic images of the head were obtained using  MRA technique without contrast. COMPARISON:  None. FINDINGS: MRI HEAD FINDINGS Brain: Abnormal diffusion restriction within the left corona radiata and subcortical left parietal lobe. No acute or chronic hemorrhage. There is multifocal hyperintense T2-weighted signal within the white matter. Parenchymal volume and CSF spaces are normal. The midline structures are normal. Vascular: Major flow voids are preserved. Skull and upper cervical spine: Normal calvarium and skull base. Visualized upper cervical spine and soft tissues are normal. Sinuses/Orbits:No paranasal sinus fluid levels or advanced mucosal thickening. No mastoid or middle ear effusion. Normal orbits. MRA HEAD FINDINGS POSTERIOR CIRCULATION: --Vertebral arteries: Normal --Inferior cerebellar arteries:  Normal. --Basilar artery: Normal. --Superior cerebellar arteries: Normal. --Posterior cerebral arteries: Normal. The right PCA is predominantly supplied by the posterior communicating artery. ANTERIOR CIRCULATION: --Intracranial internal carotid arteries: Normal. --Anterior cerebral arteries (ACA): Normal. --Middle cerebral arteries (MCA): Normal. ANATOMIC VARIANTS: None IMPRESSION: 1. Acute infarct of the left corona radiata and subcortical left parietal lobe. No hemorrhage or mass effect. 2. Normal intracranial MRA. Electronically Signed   By: Ulyses Jarred M.D.   On: 06/13/2020 22:26   MR BRAIN WO CONTRAST  Result Date: 06/13/2020 CLINICAL DATA:  Stroke follow-up. Expressive aphasia and dysarthria. EXAM: MRI HEAD WITHOUT CONTRAST MRA HEAD WITHOUT CONTRAST TECHNIQUE: Multiplanar, multiecho pulse sequences of the brain and surrounding structures were obtained without intravenous contrast. Angiographic images of the head were obtained using MRA technique without contrast. COMPARISON:  None. FINDINGS: MRI HEAD FINDINGS Brain: Abnormal diffusion restriction within the left corona radiata and subcortical left parietal lobe. No acute or chronic hemorrhage. There is multifocal hyperintense T2-weighted signal within the white matter. Parenchymal volume and CSF spaces are normal. The midline structures are normal. Vascular: Major flow voids are preserved. Skull and upper cervical spine: Normal calvarium and skull base. Visualized upper cervical spine and soft tissues are normal. Sinuses/Orbits:No paranasal sinus fluid levels or advanced mucosal thickening. No mastoid or middle ear effusion. Normal orbits. MRA HEAD FINDINGS POSTERIOR CIRCULATION: --Vertebral arteries: Normal --Inferior cerebellar arteries: Normal. --Basilar artery: Normal. --Superior cerebellar arteries: Normal. --Posterior cerebral arteries: Normal. The right PCA is predominantly supplied by the posterior communicating artery. ANTERIOR CIRCULATION:  --Intracranial internal carotid arteries: Normal. --Anterior cerebral arteries (ACA): Normal. --Middle cerebral arteries (MCA): Normal. ANATOMIC VARIANTS: None IMPRESSION: 1. Acute infarct of the left corona radiata and subcortical left parietal lobe. No hemorrhage or mass effect. 2. Normal intracranial MRA. Electronically Signed   By: Ulyses Jarred M.D.   On: 06/13/2020 22:26   ECHOCARDIOGRAM COMPLETE  Result Date: 06/14/2020    ECHOCARDIOGRAM REPORT   Patient Name:   Lauren [name] Date of Exam: 06/14/2020 Medical Rec #:  [id number]      Height:       68.0 in Accession #:    [id number]     Weight:       257.9 lb Date of Birth:  12-30-1945       BSA:          2.277 m Patient Age:    75 years       BP:           125/88 mmHg Patient Gender: F              HR:           113 bpm. Exam Location:  Inpatient Procedure: 2D Echo Indications:    stroke  History:        Patient has no prior history of Echocardiogram examinations.  Sjogren's disease, Arrythmias:Atrial Fibrillation; Risk                 Factors:Dyslipidemia and Sleep Apnea.  Sonographer:    Johny Chess Referring Phys: Maceo  1. Left ventricular ejection fraction, by estimation, is 55 to 60%. The left ventricle has normal function. The left ventricle has no regional wall motion abnormalities. Left ventricular diastolic function could not be evaluated.  2. Right ventricular systolic function is normal. The right ventricular size is normal. There is normal pulmonary artery systolic pressure.  3. Left atrial size was mildly dilated.  4. The mitral valve is normal in structure. No evidence of mitral valve regurgitation. No evidence of mitral stenosis.  5. The aortic valve is normal in structure. Aortic valve regurgitation is not visualized. No aortic stenosis is present.  6. The inferior vena cava is normal in size with greater than 50% respiratory variability, suggesting right atrial pressure of 3 mmHg. FINDINGS  Left  Ventricle: Left ventricular ejection fraction, by estimation, is 55 to 60%. The left ventricle has normal function. The left ventricle has no regional wall motion abnormalities. The left ventricular internal cavity size was normal in size. There is  no left ventricular hypertrophy. Left ventricular diastolic function could not be evaluated due to atrial fibrillation. Left ventricular diastolic function could not be evaluated. Right Ventricle: The right ventricular size is normal. No increase in right ventricular wall thickness. Right ventricular systolic function is normal. There is normal pulmonary artery systolic pressure. The tricuspid regurgitant velocity is 2.62 m/s, and  with an assumed right atrial pressure of 3 mmHg, the estimated right ventricular systolic pressure is [id number] mmHg. Left Atrium: Left atrial size was mildly dilated. Right Atrium: Right atrial size was normal in size. Pericardium: There is no evidence of pericardial effusion. Mitral Valve: The mitral valve is normal in structure. No evidence of mitral valve regurgitation. No evidence of mitral valve stenosis. Tricuspid Valve: The tricuspid valve is normal in structure. Tricuspid valve regurgitation is trivial. No evidence of tricuspid stenosis. Aortic Valve: The aortic valve is normal in structure. Aortic valve regurgitation is not visualized. No aortic stenosis is present. Pulmonic Valve: The pulmonic valve was normal in structure. Pulmonic valve regurgitation is trivial. No evidence of pulmonic stenosis. Aorta: The aortic root is normal in size and structure. Venous: The inferior vena cava is normal in size with greater than 50% respiratory variability, suggesting right atrial pressure of 3 mmHg. IAS/Shunts: No atrial level shunt detected by color flow Doppler.  LEFT VENTRICLE PLAX 2D LVIDd:         3.90 cm LVIDs:         2.70 cm LV PW:         1.00 cm LV IVS:        1.10 cm LVOT diam:     1.90 cm LVOT Area:     2.84 cm  IVC IVC diam: 1.50 cm  LEFT ATRIUM             Index       RIGHT ATRIUM           Index LA diam:        3.90 cm 1.71 cm/m  RA Area:     17.10 cm LA Vol (A2C):   56.9 ml 24.99 ml/m RA Volume:   39.80 ml  17.48 ml/m LA Vol (A4C):   47.8 ml 20.99 ml/m LA Biplane Vol: 52.4 ml 23.01 ml/m   AORTA Ao  Root diam: 3.30 cm Ao Asc diam:  3.70 cm TRICUSPID VALVE TR Peak grad:   27.5 mmHg TR Vmax:        262.00 cm/s  SHUNTS Systemic Diam: 1.90 cm Dani Gobble Croitoru MD Electronically signed by Sanda Klein MD Signature Date/Time: 06/14/2020/1:11:19 PM    Final    CT HEAD CODE STROKE WO CONTRAST  Result Date: 06/13/2020 CLINICAL DATA:  Code stroke. Neuro deficit, acute, stroke suspected. Acute onset of left facial droop. Last seen normal at 11 p.m. last night. EXAM: CT HEAD WITHOUT CONTRAST TECHNIQUE: Contiguous axial images were obtained from the base of the skull through the vertex without intravenous contrast. COMPARISON:  None. FINDINGS: Brain: No acute infarct, hemorrhage, or mass lesion is present. Mild atrophy and white matter changes are noted bilaterally. The basal ganglia are intact. Insular ribbon is normal. No acute or focal cortical abnormalities are present. The ventricles are of normal size. No significant extraaxial fluid collection is present. The brainstem and cerebellum are within normal limits. Vascular: Atherosclerotic calcifications are present within the cavernous internal carotid arteries bilaterally. No hyperdense vessel is present. Skull: Calvarium is intact. No focal lytic or blastic lesions are present. No significant extracranial soft tissue lesion is present. Sinuses/Orbits: The paranasal sinuses and mastoid air cells are clear. Bilateral lens replacements are noted. Globes and orbits are otherwise unremarkable. ASPECTS [facility] Stroke Program Early CT Score) - Ganglionic level infarction (caudate, lentiform nuclei, internal capsule, insula, M1-M3 cortex): 7/7 - Supraganglionic infarction (M4-M6 cortex): 3/3 Total score  (0-10 with 10 being normal): 10/10 IMPRESSION: 1. No acute intracranial abnormality. 2. Normal CT appearance of the brain for age. 3. ASPECTS is 10/10. The above was relayed via text pager to Dr. Kerney Elbe on 06/13/2020 at 14:10 . Electronically Signed   By: San Morelle M.D.   On: 06/13/2020 14:10   VAS US CAROTID (at [facility] and [facility] only)  Result Date: 06/13/2020 Carotid Arterial Duplex Study Patient Name:  Lauren [name]  Date of Exam:   06/13/2020 Medical Rec #: [id number]       Accession #:    [id number] Date of Birth: 1945/10/22        Patient Gender: F Patient Age:   63Y Exam Location:  [facility] Procedure:      VAS US CAROTID Referring Phys: [id number] JENNIFER YATES --------------------------------------------------------------------------------  Indications:       CVA. Comparison Study:  No prior study Performing Technologist: Maudry Mayhew MHA, RDMS, RVT, RDCS  Examination Guidelines: A complete evaluation includes B-mode imaging, spectral Doppler, color Doppler, and power Doppler as needed of all accessible portions of each vessel. Bilateral testing is considered an integral part of a complete examination. Limited examinations for reoccurring indications may be performed as noted.  Right Carotid Findings: +----------+--------+--------+--------+--------------------------+--------+           PSV cm/sEDV cm/sStenosisPlaque Description        Comments +----------+--------+--------+--------+--------------------------+--------+ CCA Prox  85      21                                                 +----------+--------+--------+--------+--------------------------+--------+ CCA Distal68      16              smooth and heterogenous            +----------+--------+--------+--------+--------------------------+--------+ ICA Prox  31  8               heterogenous and irregular         +----------+--------+--------+--------+--------------------------+--------+ ICA Distal76       26                                                 +----------+--------+--------+--------+--------------------------+--------+ ECA       57      12                                                 +----------+--------+--------+--------+--------------------------+--------+ +----------+--------+-------+----------------+-------------------+           PSV cm/sEDV cmsDescribe        Arm Pressure (mmHG) +----------+--------+-------+----------------+-------------------+ [id number]            Multiphasic, WNL                    +----------+--------+-------+----------------+-------------------+ +---------+--------+--+--------+--+---------+ VertebralPSV cm/s44EDV cm/s14Antegrade +---------+--------+--+--------+--+---------+  Left Carotid Findings: +----------+--------+--------+--------+------------------+------------------+           PSV cm/sEDV cm/sStenosisPlaque DescriptionComments           +----------+--------+--------+--------+------------------+------------------+ CCA Prox  108     21                                                   +----------+--------+--------+--------+------------------+------------------+ CCA Distal76      22                                intimal thickening +----------+--------+--------+--------+------------------+------------------+ ICA Prox  36      7                                                    +----------+--------+--------+--------+------------------+------------------+ ICA Distal51      16                                                   +----------+--------+--------+--------+------------------+------------------+ ECA       45      8                                                    +----------+--------+--------+--------+------------------+------------------+ +----------+--------+--------+----------------+-------------------+           PSV cm/sEDV cm/sDescribe        Arm Pressure (mmHG)  +----------+--------+--------+----------------+-------------------+ Subclavian130             Multiphasic, WNL                    +----------+--------+--------+----------------+-------------------+ +---------+--------+--+--------+--+---------+ VertebralPSV cm/s54EDV cm/s18Antegrade +---------+--------+--+--------+--+---------+   Summary:  Right Carotid: Velocities in the right ICA are consistent with a 1-39% stenosis. Left Carotid: Velocities in the left ICA are consistent with a 1-39% stenosis. Vertebrals:  Bilateral vertebral arteries demonstrate antegrade flow. Subclavians: Normal flow hemodynamics were seen in bilateral subclavian              arteries. *See table(s) above for measurements and observations.     Preliminary     ECG - atrial flutter - ventricular response 113 BPM (See cardiology reading for complete details)   PHYSICAL EXAM Blood pressure 128/73, pulse 89, temperature 98.2 F (36.8 C), temperature source Oral, resp. rate 17, height 5\' 8"  (1.727 m), weight 117 kg, SpO2 95 %. GENERAL:  She is doing well at this time.  HEENT:  Normal  EXTREMITIES: No edema   SKIN: Normal by inspection.    MENTAL STATUS: Alert and oriented. Speech, language and cognition are generally intact.  Occasional some word finding difficulties and paraphasic errors.  Judgment and insight normal.   CRANIAL NERVES: Pupils are equal, round and reactive to light and accomodation; extra ocular movements are full, there is no significant nystagmus; visual fields are full; upper and lower facial muscles are normal in strength and symmetric, there is no flattening of the nasolabial folds; tongue is midline; uvula is midline; shoulder elevation is normal.  MOTOR: Normal tone, bulk and strength; no pronator drift.  COORDINATION: Left finger to nose is normal, right finger to nose is normal, No rest tremor; no intention tremor; no postural tremor; no bradykinesia.  SENSATION: Normal to light  touch        ASSESSMENT/PLAN Lauren [name] is a 75 y.o. female with history of breast CA s/p bilateral mastectomies, HLD (statin intolerant), elevated A1c, osteopenia, reactive depression, Sjogren's syndrome, urinary incontinence and vitamin D deficiency who presents to the ED from home via EMS as a Code Stroke for acute onset of slurred speech, trouble reading and left facial droop. She did not receive IV t-PA due to late presentation (>4.5 hours from time of onset).  Stroke: Acute infarct of the left corona radiata and subcortical left parietal lobe - embolic - atrial fibrillation  Resultant   Temporary dysarthria /aphasia but back to normal now  Code Stroke CT Head -  No acute intracranial abnormality. Normal CT appearance of the brain for age. ASPECTS is 10/10.    CT head - not ordered  MRI head - Acute infarct of the left corona radiata and subcortical left parietal lobe. No hemorrhage or mass effect.   MRA head - Normal intracranial MRA.  CTA H&N - not ordered  CT Perfusion - not ordered  Carotid Doppler - unremarkable  2D Echo - EF 55 - 60%. No cardiac source of emboli identified.   Sars Corona Virus 2 - negative  LDL - 124  HgbA1c - 6.1  UDS - not ordered  VTE prophylaxis - Eliquis Diet  Diet Order            Diet - low sodium heart healthy           Diet heart healthy/carb modified Room service appropriate? Yes; Fluid consistency: Thin  Diet effective ____                 No antithrombotic prior to admission, now on Eliquis (apixaban) daily  Patient will be counseled to be compliant with her antithrombotic medications  Ongoing aggressive stroke risk factor management  Therapy recommendations:  pending  Disposition:  Pending  Hypertension  Home BP meds: none  Current BP meds: metoprolol  Stable . Permissive hypertension (OK if < 220/120) but gradually normalize in 5-7 days  . Long-term BP goal normotensive  Hyperlipidemia  Home  Lipid lowering medication: Zetia  LDL 124, goal < 70  Current lipid lowering medication: Zetia  Continue statin at discharge  Other Stroke Risk Factors  Advanced age  Obesity, Body mass index is 39.22 kg/m., recommend weight loss, diet and exercise as appropriate   Family hx stroke (Mat. Grandmother)   Other Active Problems, Findings, Recommendations and/or Plan  Code status - DNR  Atrial flutter/  Atrial fibrillation with rapid ventricular response started on metoprolol  Glucose intolerance   Hospital day # 2  Patient presented with speech difficulties secondary to left parietal cortical insular subcortical infarcts likely cryptogenic etiology.  Recommend loop recorder for paroxysmal A. fib.  Aspirin and Plavix for 3 weeks followed by aspirin alone and aggressive risk factor modification.  Follow-up as an outpatient stroke clinic in 6 weeks.  Stroke team will sign off kindly call for questions.  Greater than 50% time during this 25-minute visit were spent in counseling and coordination of care about her cryptogenic stroke and answering questions.  Antony Contras MD  To contact Stroke Continuity provider, please refer to [email or web address] After hours, contact General Neurology

## 2020-06-15 NOTE — Plan of Care (Signed)
  Problem: Education: Goal: Knowledge of disease or condition will improve Outcome: Progressing Goal: Knowledge of secondary prevention will improve Outcome: Progressing Goal: Knowledge of patient specific risk factors addressed and post discharge goals established will improve Outcome: Progressing   

## 2020-06-15 NOTE — Discharge Summary (Signed)
Physician Discharge Summary  Lauren [name] [id number] DOB: 1945/11/27 DOA: 06/13/2020  PCP: Abner Greenspan, MD  Admit date: 06/13/2020 Discharge date: 06/15/2020  Admitted From: Home Disposition:  Home   Recommendations for Outpatient Follow-up:  1. Follow up with PCP in 1-2 weeks 2. Please obtain BMP/CBC in one week 3. Please arrange for Repatha as outpatient , as she may benefit from it given her statin intolerance. 4. [facility] we will arrange for outpatient follow-up with cardiology in [place] Equipment/Devices:No  Discharge Condition:Stable CODE STATUS:FULL Diet recommendation: Heart Healthy   Brief/Interim Summary:  Lauren [name] a 75 y.o.femalewith medical history significant ofSjogren's; HLD; OSA on CPAP; obesity; and breast cancer presenting with neurologic symptoms concerning for stroke.She woke up about 920 and realized that she was unable to read her text messages. Her speech was different. She was unable to read her Bible. She feels like it was her brain, not her vision - like the letters were backwards. She had expressive aphasia, also some dysarthria. No motor symptoms but some tingling in the left foot and side - all the way up and down. No dysphagia. No headache. No h/o stroke.She does not take ASA daily.  Her work-up was significant for new onset A. fib/flutter, and acute CVA on MRI.   CVA -MRI brain significant for acute infarct of the left corona radiata, and subcortical left parietal lobe, normal intracranial MRA, no significant occlusion in carotid or vertebral arteries, with a preserved EF, no significant valvular disease, work-up significant for new onset A. fib, she remains with persistent A. fib during hospital stay, neurology input greatly appreciated, she was started on Eliquis. -PT/OT/SLP seen patient during hospital stay, will arrange for home health on discharge.  New onset afib/flutter -Patient with apparent  discussion about afib during pulm appt in March, told to f/u with PCP -She remains in persistent A. fib during hospital stay, heart rate is elevated, so she was started on low-dose metoprolol 12.5 mg p.o. twice daily, with overall good control, she will be discharged on current dose . -TSH within normal limit . -She is started on Eliquis . -Discussed with [facility] coordinator, they will arrange for outpatient follow-up with cardiology at [facility] office .  HTN -Allow permissive HTNduring hospital stay, overall her blood pressure has been controlled, she is not on any home medications, she was started on metoprolol mainly for heart rate control.  HLD -Reported statin intolerance -LDL is 124, continue Zetia for now, will need  referral for Repatha as an outpatient  Pre-DM -PriorA1c shows reasonably good control, 6.1 in 02/2020 -Will order moderate-scale SSI  Sjogren's -Continue pilocarpine  Depression -Continue Prozac  Obesity -Body mass index is 39.22 kg/m..  -Weight loss should be encouraged -Outpatient PCP/bariatric medicine f/u encouraged  OSA -Resume CPAP at the time of d/c     Discharge Diagnoses:  Principal Problem:   Acute CVA (cerebrovascular accident) ([place]) Active Problems:   Hyperlipidemia   Prediabetes   OSA (obstructive sleep apnea)   Sjogren's disease ([place])   Obesity (BMI 30-39.9)   DNR (do not resuscitate)    Discharge Instructions  Discharge Instructions    Ambulatory referral to Neurology   Complete by: As directed    An appointment is requested in approximately: 4 weeks   Diet - low sodium heart healthy   Complete by: As directed    Discharge instructions   Complete by: As directed    Follow with Primary MD Tower, Wynelle Fanny, MD in 7  days   Get CBC, CMP,checked  by Primary MD next visit.    Activity: As tolerated with Full fall precautions use walker/cane & assistance as needed   Disposition Home    Diet: Heart Healthy   .   On your next visit with your primary care physician please Get Medicines reviewed and adjusted.   Please request your Prim.MD to go over all Hospital Tests and Procedure/Radiological results at the follow up, please get all Hospital records sent to your Prim MD by signing hospital release before you go home.   If you experience worsening of your admission symptoms, develop shortness of breath, life threatening emergency, suicidal or homicidal thoughts you must seek medical attention immediately by calling 911 or calling your MD immediately  if symptoms less severe.  You Must read complete instructions/literature along with all the possible adverse reactions/side effects for all the Medicines you take and that have been prescribed to you. Take any new Medicines after you have completely understood and accpet all the possible adverse reactions/side effects.   Do not drive, operating heavy machinery, perform activities at heights, swimming or participation in water activities or provide baby sitting services if your were admitted for syncope or siezures until you have seen by Primary MD or a Neurologist and advised to do so again.  Do not drive when taking Pain medications.    Do not take more than prescribed Pain, Sleep and Anxiety Medications  Special Instructions: If you have smoked or chewed Tobacco  in the last 2 yrs please stop smoking, stop any regular Alcohol  and or any Recreational drug use.  Wear Seat belts while driving.   Please note  You were cared for by a hospitalist during your hospital stay. If you have any questions about your discharge medications or the care you received while you were in the hospital after you are discharged, you can call the unit and asked to speak with the hospitalist on call if the hospitalist that took care of you is not available. Once you are discharged, your primary care physician will handle any further medical issues. Please note that NO  REFILLS for any discharge medications will be authorized once you are discharged, as it is imperative that you return to your primary care physician (or establish a relationship with a primary care physician if you do not have one) for your aftercare needs so that they can reassess your need for medications and monitor your lab values.   Increase activity slowly   Complete by: As directed      Allergies as of 06/15/2020      Reactions   Bee Venom    Statins    REACTION: joint pain      Medication List    STOP taking these medications   amoxicillin 500 MG capsule Commonly known as: AMOXIL   celecoxib 200 MG capsule Commonly known as: CELEBREX     TAKE these medications   apixaban 5 MG Tabs tablet Commonly known as: ELIQUIS Take 1 tablet (5 mg total) by mouth 2 (two) times daily.   cetirizine 10 MG tablet Commonly known as: ZYRTEC Take 10 mg by mouth at bedtime.   cholecalciferol 1000 units tablet Commonly known as: VITAMIN D Take 1,000 Units by mouth daily.   EPINEPHrine 0.3 mg/0.3 mL Soaj injection Commonly known as: EPI-PEN Inject 0.3 mLs (0.3 mg total) into the muscle once.   ezetimibe 10 MG tablet Commonly known as: ZETIA Take 1 tablet (10 mg  total) by mouth daily. What changed: when to take this   FLUoxetine 20 MG capsule Commonly known as: PROZAC Take 1 capsule (20 mg total) by mouth daily.   gabapentin 300 MG capsule Commonly known as: NEURONTIN Take 1 capsule (300 mg total) by mouth 2 (two) times daily.   metoprolol tartrate 25 MG tablet Commonly known as: LOPRESSOR Take 0.5 tablets (12.5 mg total) by mouth 2 (two) times daily.   OMEGA-3 + VITAMIN D3 PO Take 2 capsules by mouth daily.   omeprazole 20 MG capsule Commonly known as: PRILOSEC Take 20 mg by mouth daily.   oxybutynin 3.9 MG/24HR Commonly known as: OXYTROL Place 1 patch onto the skin 2 (two) times a week. What changed: additional instructions   pilocarpine 5 MG tablet Commonly known  as: SALAGEN Take 1 tablet (5 mg total) by mouth 3 (three) times daily.       Follow-up Information    Tower, Wynelle Fanny, MD Follow up in 1 week(s).   Specialties: Family Medicine, Radiology Contact information: [place] [address] [phone]              Allergies  Allergen Reactions  . Bee Venom   . Statins     REACTION: joint pain    Consultations:  Neurology   Procedures/Studies: MR ANGIO HEAD WO CONTRAST  Result Date: 06/13/2020 CLINICAL DATA:  Stroke follow-up. Expressive aphasia and dysarthria. EXAM: MRI HEAD WITHOUT CONTRAST MRA HEAD WITHOUT CONTRAST TECHNIQUE: Multiplanar, multiecho pulse sequences of the brain and surrounding structures were obtained without intravenous contrast. Angiographic images of the head were obtained using MRA technique without contrast. COMPARISON:  None. FINDINGS: MRI HEAD FINDINGS Brain: Abnormal diffusion restriction within the left corona radiata and subcortical left parietal lobe. No acute or chronic hemorrhage. There is multifocal hyperintense T2-weighted signal within the white matter. Parenchymal volume and CSF spaces are normal. The midline structures are normal. Vascular: Major flow voids are preserved. Skull and upper cervical spine: Normal calvarium and skull base. Visualized upper cervical spine and soft tissues are normal. Sinuses/Orbits:No paranasal sinus fluid levels or advanced mucosal thickening. No mastoid or middle ear effusion. Normal orbits. MRA HEAD FINDINGS POSTERIOR CIRCULATION: --Vertebral arteries: Normal --Inferior cerebellar arteries: Normal. --Basilar artery: Normal. --Superior cerebellar arteries: Normal. --Posterior cerebral arteries: Normal. The right PCA is predominantly supplied by the posterior communicating artery. ANTERIOR CIRCULATION: --Intracranial internal carotid arteries: Normal. --Anterior cerebral arteries (ACA): Normal. --Middle cerebral arteries (MCA): Normal. ANATOMIC VARIANTS:  None IMPRESSION: 1. Acute infarct of the left corona radiata and subcortical left parietal lobe. No hemorrhage or mass effect. 2. Normal intracranial MRA. Electronically Signed   By: Ulyses Jarred M.D.   On: 06/13/2020 22:26   MR BRAIN WO CONTRAST  Result Date: 06/13/2020 CLINICAL DATA:  Stroke follow-up. Expressive aphasia and dysarthria. EXAM: MRI HEAD WITHOUT CONTRAST MRA HEAD WITHOUT CONTRAST TECHNIQUE: Multiplanar, multiecho pulse sequences of the brain and surrounding structures were obtained without intravenous contrast. Angiographic images of the head were obtained using MRA technique without contrast. COMPARISON:  None. FINDINGS: MRI HEAD FINDINGS Brain: Abnormal diffusion restriction within the left corona radiata and subcortical left parietal lobe. No acute or chronic hemorrhage. There is multifocal hyperintense T2-weighted signal within the white matter. Parenchymal volume and CSF spaces are normal. The midline structures are normal. Vascular: Major flow voids are preserved. Skull and upper cervical spine: Normal calvarium and skull base. Visualized upper cervical spine and soft tissues are normal. Sinuses/Orbits:No paranasal sinus fluid levels or  advanced mucosal thickening. No mastoid or middle ear effusion. Normal orbits. MRA HEAD FINDINGS POSTERIOR CIRCULATION: --Vertebral arteries: Normal --Inferior cerebellar arteries: Normal. --Basilar artery: Normal. --Superior cerebellar arteries: Normal. --Posterior cerebral arteries: Normal. The right PCA is predominantly supplied by the posterior communicating artery. ANTERIOR CIRCULATION: --Intracranial internal carotid arteries: Normal. --Anterior cerebral arteries (ACA): Normal. --Middle cerebral arteries (MCA): Normal. ANATOMIC VARIANTS: None IMPRESSION: 1. Acute infarct of the left corona radiata and subcortical left parietal lobe. No hemorrhage or mass effect. 2. Normal intracranial MRA. Electronically Signed   By: Ulyses Jarred M.D.   On: 06/13/2020  22:26   ECHOCARDIOGRAM COMPLETE  Result Date: 06/14/2020    ECHOCARDIOGRAM REPORT   Patient Name:   APURVA REILY Date of Exam: 06/14/2020 Medical Rec #:  [id number]      Height:       68.0 in Accession #:    [id number]     Weight:       257.9 lb Date of Birth:  [birth year]       BSA:          2.277 m Patient Age:    75 years       BP:           125/88 mmHg Patient Gender: F              HR:           113 bpm. Exam Location:  Inpatient Procedure: 2D Echo Indications:    stroke  History:        Patient has no prior history of Echocardiogram examinations.                 Sjogren's disease, Arrythmias:Atrial Fibrillation; Risk                 Factors:Dyslipidemia and Sleep Apnea.  Sonographer:    Johny Chess Referring Phys: [place]  1. Left ventricular ejection fraction, by estimation, is 55 to 60%. The left ventricle has normal function. The left ventricle has no regional wall motion abnormalities. Left ventricular diastolic function could not be evaluated.  2. Right ventricular systolic function is normal. The right ventricular size is normal. There is normal pulmonary artery systolic pressure.  3. Left atrial size was mildly dilated.  4. The mitral valve is normal in structure. No evidence of mitral valve regurgitation. No evidence of mitral stenosis.  5. The aortic valve is normal in structure. Aortic valve regurgitation is not visualized. No aortic stenosis is present.  6. The inferior vena cava is normal in size with greater than 50% respiratory variability, suggesting right atrial pressure of 3 mmHg. FINDINGS  Left Ventricle: Left ventricular ejection fraction, by estimation, is 55 to 60%. The left ventricle has normal function. The left ventricle has no regional wall motion abnormalities. The left ventricular internal cavity size was normal in size. There is  no left ventricular hypertrophy. Left ventricular diastolic function could not be evaluated due to atrial fibrillation. Left  ventricular diastolic function could not be evaluated. Right Ventricle: The right ventricular size is normal. No increase in right ventricular wall thickness. Right ventricular systolic function is normal. There is normal pulmonary artery systolic pressure. The tricuspid regurgitant velocity is 2.62 m/s, and  with an assumed right atrial pressure of 3 mmHg, the estimated right ventricular systolic pressure is 62.6 mmHg. Left Atrium: Left atrial size was mildly dilated. Right Atrium: Right atrial size was normal in size. Pericardium: There is no evidence of pericardial  effusion. Mitral Valve: The mitral valve is normal in structure. No evidence of mitral valve regurgitation. No evidence of mitral valve stenosis. Tricuspid Valve: The tricuspid valve is normal in structure. Tricuspid valve regurgitation is trivial. No evidence of tricuspid stenosis. Aortic Valve: The aortic valve is normal in structure. Aortic valve regurgitation is not visualized. No aortic stenosis is present. Pulmonic Valve: The pulmonic valve was normal in structure. Pulmonic valve regurgitation is trivial. No evidence of pulmonic stenosis. Aorta: The aortic root is normal in size and structure. Venous: The inferior vena cava is normal in size with greater than 50% respiratory variability, suggesting right atrial pressure of 3 mmHg. IAS/Shunts: No atrial level shunt detected by color flow Doppler.  LEFT VENTRICLE PLAX 2D LVIDd:         3.90 cm LVIDs:         2.70 cm LV PW:         1.00 cm LV IVS:        1.10 cm LVOT diam:     1.90 cm LVOT Area:     2.84 cm  IVC IVC diam: 1.50 cm LEFT ATRIUM             Index       RIGHT ATRIUM           Index LA diam:        3.90 cm 1.71 cm/m  RA Area:     17.10 cm LA Vol (A2C):   56.9 ml 24.99 ml/m RA Volume:   39.80 ml  17.48 ml/m LA Vol (A4C):   47.8 ml 20.99 ml/m LA Biplane Vol: 52.4 ml 23.01 ml/m   AORTA Ao Root diam: 3.30 cm Ao Asc diam:  3.70 cm TRICUSPID VALVE TR Peak grad:   27.5 mmHg TR Vmax:         262.00 cm/s  SHUNTS Systemic Diam: 1.90 cm Dani Gobble Croitoru MD Electronically signed by Sanda Klein MD Signature Date/Time: 06/14/2020/1:11:19 PM    Final    CT HEAD CODE STROKE WO CONTRAST  Result Date: 06/13/2020 CLINICAL DATA:  Code stroke. Neuro deficit, acute, stroke suspected. Acute onset of left facial droop. Last seen normal at 11 p.m. last night. EXAM: CT HEAD WITHOUT CONTRAST TECHNIQUE: Contiguous axial images were obtained from the base of the skull through the vertex without intravenous contrast. COMPARISON:  None. FINDINGS: Brain: No acute infarct, hemorrhage, or mass lesion is present. Mild atrophy and white matter changes are noted bilaterally. The basal ganglia are intact. Insular ribbon is normal. No acute or focal cortical abnormalities are present. The ventricles are of normal size. No significant extraaxial fluid collection is present. The brainstem and cerebellum are within normal limits. Vascular: Atherosclerotic calcifications are present within the cavernous internal carotid arteries bilaterally. No hyperdense vessel is present. Skull: Calvarium is intact. No focal lytic or blastic lesions are present. No significant extracranial soft tissue lesion is present. Sinuses/Orbits: The paranasal sinuses and mastoid air cells are clear. Bilateral lens replacements are noted. Globes and orbits are otherwise unremarkable. ASPECTS [facility] Stroke Program Early CT Score) - Ganglionic level infarction (caudate, lentiform nuclei, internal capsule, insula, M1-M3 cortex): 7/7 - Supraganglionic infarction (M4-M6 cortex): 3/3 Total score (0-10 with 10 being normal): 10/10 IMPRESSION: 1. No acute intracranial abnormality. 2. Normal CT appearance of the brain for age. 3. ASPECTS is 10/10. The above was relayed via text pager to Dr. Kerney Elbe on 06/13/2020 at 14:10 . Electronically Signed   By: Wynetta Fines.D.  On: 06/13/2020 14:10   VAS US CAROTID (at [facility] and [facility] only)  Result Date:  06/13/2020 Carotid Arterial Duplex Study Patient Name:  KATYANA NACCARATO  Date of Exam:   06/13/2020 Medical Rec #: [id number]       Accession #:    [id number] Date of Birth: 1945/12/29        Patient Gender: F Patient Age:   53Y Exam Location:  [facility] Procedure:      VAS US CAROTID Referring Phys: [id number] JENNIFER YATES --------------------------------------------------------------------------------  Indications:       CVA. Comparison Study:  No prior study Performing Technologist: Maudry Mayhew MHA, RDMS, RVT, RDCS  Examination Guidelines: A complete evaluation includes B-mode imaging, spectral Doppler, color Doppler, and power Doppler as needed of all accessible portions of each vessel. Bilateral testing is considered an integral part of a complete examination. Limited examinations for reoccurring indications may be performed as noted.  Right Carotid Findings: +----------+--------+--------+--------+--------------------------+--------+           PSV cm/sEDV cm/sStenosisPlaque Description        Comments +----------+--------+--------+--------+--------------------------+--------+ CCA Prox  85      21                                                 +----------+--------+--------+--------+--------------------------+--------+ CCA Distal68      16              smooth and heterogenous            +----------+--------+--------+--------+--------------------------+--------+ ICA Prox  31      8               heterogenous and irregular         +----------+--------+--------+--------+--------------------------+--------+ ICA Distal76      26                                                 +----------+--------+--------+--------+--------------------------+--------+ ECA       57      12                                                 +----------+--------+--------+--------+--------------------------+--------+ +----------+--------+-------+----------------+-------------------+            PSV cm/sEDV cmsDescribe        Arm Pressure (mmHG) +----------+--------+-------+----------------+-------------------+ [id number]            Multiphasic, WNL                    +----------+--------+-------+----------------+-------------------+ +---------+--------+--+--------+--+---------+ VertebralPSV cm/s44EDV cm/s14Antegrade +---------+--------+--+--------+--+---------+  Left Carotid Findings: +----------+--------+--------+--------+------------------+------------------+           PSV cm/sEDV cm/sStenosisPlaque DescriptionComments           +----------+--------+--------+--------+------------------+------------------+ CCA Prox  108     21                                                   +----------+--------+--------+--------+------------------+------------------+ CCA Distal76  22                                intimal thickening +----------+--------+--------+--------+------------------+------------------+ ICA Prox  36      7                                                    +----------+--------+--------+--------+------------------+------------------+ ICA Distal51      16                                                   +----------+--------+--------+--------+------------------+------------------+ ECA       45      8                                                    +----------+--------+--------+--------+------------------+------------------+ +----------+--------+--------+----------------+-------------------+           PSV cm/sEDV cm/sDescribe        Arm Pressure (mmHG) +----------+--------+--------+----------------+-------------------+ Subclavian130             Multiphasic, WNL                    +----------+--------+--------+----------------+-------------------+ +---------+--------+--+--------+--+---------+ VertebralPSV cm/s54EDV cm/s18Antegrade +---------+--------+--+--------+--+---------+   Summary: Right Carotid: Velocities  in the right ICA are consistent with a 1-39% stenosis. Left Carotid: Velocities in the left ICA are consistent with a 1-39% stenosis. Vertebrals:  Bilateral vertebral arteries demonstrate antegrade flow. Subclavians: Normal flow hemodynamics were seen in bilateral subclavian              arteries. *See table(s) above for measurements and observations.     Preliminary       ECHOCARDIOGRAM REPORT       Patient Name:  MALANEY MCBEAN Date of Exam: 06/14/2020  Medical Rec #: [id number]   Height:    68.0 in  Accession #:  [id number]   Weight:    257.9 lb  Date of Birth: 04/01/1945    BSA:     2.277 m  Patient Age:  79 years    BP:      125/88 mmHg  Patient Gender: F       HR:      113 bpm.  Exam Location: Inpatient   Procedure: 2D Echo   Indications:  stroke    History:    Patient has no prior history of Echocardiogram  examinations.         Sjogren's disease, Arrythmias:Atrial Fibrillation; Risk         Factors:Dyslipidemia and Sleep Apnea.    Sonographer:  Johny Chess  Referring Phys: [place]    1. Left ventricular ejection fraction, by estimation, is 55 to 60%. The  left ventricle has normal function. The left ventricle has no regional  wall motion abnormalities. Left ventricular diastolic function could not  be evaluated.  2. Right ventricular systolic function is normal. The right ventricular  size is normal. There is normal pulmonary artery systolic pressure.  3. Left atrial size was mildly dilated.  4. The mitral valve is normal in structure. No evidence of mitral valve  regurgitation. No evidence of mitral stenosis.  5. The aortic valve is normal in structure. Aortic valve regurgitation is  not visualized. No aortic stenosis is present.  6. The inferior vena cava is normal in size with greater than 50%  respiratory variability, suggesting right atrial pressure  of 3 mmHg.   FINDINGS  Left Ventricle: Left ventricular ejection fraction, by estimation, is 55  to 60%. The left ventricle has normal function. The left ventricle has no  regional wall motion abnormalities. The left ventricular internal cavity  size was normal in size. There is  no left ventricular hypertrophy. Left ventricular diastolic function  could not be evaluated due to atrial fibrillation. Left ventricular  diastolic function could not be evaluated.   Right Ventricle: The right ventricular size is normal. No increase in  right ventricular wall thickness. Right ventricular systolic function is  normal. There is normal pulmonary artery systolic pressure. The tricuspid  regurgitant velocity is 2.62 m/s, and  with an assumed right atrial pressure of 3 mmHg, the estimated right  ventricular systolic pressure is 37.1 mmHg.   Left Atrium: Left atrial size was mildly dilated.   Right Atrium: Right atrial size was normal in size.   Pericardium: There is no evidence of pericardial effusion.   Mitral Valve: The mitral valve is normal in structure. No evidence of  mitral valve regurgitation. No evidence of mitral valve stenosis.   Tricuspid Valve: The tricuspid valve is normal in structure. Tricuspid  valve regurgitation is trivial. No evidence of tricuspid stenosis.   Aortic Valve: The aortic valve is normal in structure. Aortic valve  regurgitation is not visualized. No aortic stenosis is present.   Pulmonic Valve: The pulmonic valve was normal in structure. Pulmonic valve  regurgitation is trivial. No evidence of pulmonic stenosis.   Aorta: The aortic root is normal in size and structure.   Venous: The inferior vena cava is normal in size with greater than 50%  respiratory variability, suggesting right atrial pressure of 3 mmHg.   IAS/Shunts: No atrial level shunt detected by color flow Doppler.     LEFT VENTRICLE  PLAX 2D  LVIDd:     3.90 cm  LVIDs:      2.70 cm  LV PW:     1.00 cm  LV IVS:    1.10 cm  LVOT diam:   1.90 cm  LVOT Area:   2.84 cm     IVC  IVC diam: 1.50 cm   LEFT ATRIUM       Index    RIGHT ATRIUM      Index  LA diam:    3.90 cm 1.71 cm/m RA Area:   17.10 cm  LA Vol (A2C):  56.9 ml 24.99 ml/m RA Volume:  39.80 ml 17.48 ml/m  LA Vol (A4C):  47.8 ml 20.99 ml/m  LA Biplane Vol: 52.4 ml 23.01 ml/m    AORTA  Ao Root diam: 3.30 cm  Ao Asc diam: 3.70 cm   TRICUSPID VALVE  TR Peak grad:  27.5 mmHg  TR Vmax:    262.00 cm/s    SHUNTS  Systemic Diam: 1.90 cm   Mihai Croitoru MD  Electronically signed by Sanda Klein MD  Signature Date/Time: 06/14/2020/1:11:19 PM      Final  Subjective:  She reports good night sleep, denies any further palpitation overnight worsening deficits, report focal weakness and speech is improving.  Discharge  Exam: Vitals:   06/15/20 0908 06/15/20 1134  BP: (!) 143/97 128/73  Pulse: 97 89  Resp: 18 17  Temp: 97.9 F (36.6 C) 98.2 F (36.8 C)  SpO2: 95% 95%   Vitals:   06/15/20 0236 06/15/20 0501 06/15/20 0908 06/15/20 1134  BP: 140/76 118/77 (!) 143/97 128/73  Pulse: 81 75 97 89  Resp: (!) 24 15 18 17   Temp: 98.2 F (36.8 C) (!) 97.4 F (36.3 C) 97.9 F (36.6 C) 98.2 F (36.8 C)  TempSrc: Axillary Axillary Oral Oral  SpO2: 92% 96% 95% 95%  Weight:      Height:        General: Pt is alert, awake, not in acute distress Cardiovascular: Irr Irr , S1/S2 +, no rubs, no gallops Respiratory: CTA bilaterally, no wheezing, no rhonchi Abdominal: Soft, NT, ND, bowel sounds + Extremities: no edema, no cyanosis    The results of significant diagnostics from this hospitalization (including imaging, microbiology, ancillary and laboratory) are listed below for reference.     Microbiology: Recent Results (from the past 240 hour(s))  SARS CORONAVIRUS 2 (TAT 6-24 HRS) Nasopharyngeal Nasopharyngeal Swab     Status: None    Collection Time: 06/13/20  5:20 PM   Specimen: Nasopharyngeal Swab  Result Value Ref Range Status   SARS Coronavirus 2 NEGATIVE NEGATIVE Final    Comment: (NOTE) SARS-CoV-2 target nucleic acids are NOT DETECTED.  The SARS-CoV-2 RNA is generally detectable in upper and lower respiratory specimens during the acute phase of infection. Negative results do not preclude SARS-CoV-2 infection, do not rule out co-infections with other pathogens, and should not be used as the sole basis for treatment or other patient management decisions. Negative results must be combined with clinical observations, patient history, and epidemiological information. The expected result is Negative.  Fact Sheet for Patients: SugarRoll.be  Fact Sheet for Healthcare Providers: https://www.woods-mathews.com/  This test is not yet approved or cleared by the [place] FDA and  has been authorized for detection and/or diagnosis of SARS-CoV-2 by FDA under an Emergency Use Authorization (EUA). This EUA will remain  in effect (meaning this test can be used) for the duration of the COVID-19 declaration under Se ction 564(b)(1) of the Act, 21 U.S.C. section 360bbb-3(b)(1), unless the authorization is terminated or revoked sooner.  Performed at [facility], [place] [address], [place], [place] [address]      Labs: BNP (last 3 results) No results for input(s): BNP in the last 8760 hours. Basic Metabolic Panel: Recent Labs  Lab 06/13/20 1356 06/13/20 1403 06/15/20 0722  NA 138 141 137  K 3.7 3.8 3.9  CL 106 106 105  CO2 24  --  24  GLUCOSE 141* 140* 134*  BUN 11 12 13   CREATININE 0.73 0.60 0.59  CALCIUM 8.7*  --  8.7*  MG  --   --  2.0   Liver Function Tests: Recent Labs  Lab 06/13/20 1356  AST 20  ALT 16  ALKPHOS 68  BILITOT 0.5  PROT 6.7  ALBUMIN 3.6   No results for input(s): LIPASE, AMYLASE in the last 168 hours. No results for input(s):  AMMONIA in the last 168 hours. CBC: Recent Labs  Lab 06/13/20 1356 06/13/20 1403 06/15/20 0722  WBC 10.6*  --  9.4  NEUTROABS 7.8*  --   --   HGB 13.9 14.3 14.2  HCT 43.4 42.0 44.4  MCV 88.4  --  86.7  PLT 194  --  209  Cardiac Enzymes: No results for input(s): CKTOTAL, CKMB, CKMBINDEX, TROPONINI in the last 168 hours. BNP: Invalid input(s): POCBNP CBG: Recent Labs  Lab 06/13/20 1706 06/13/20 2104 06/14/20 0652 06/14/20 1304 06/14/20 1613  GLUCAP 126* 120* 124* 109* 114*   D-Dimer No results for input(s): DDIMER in the last 72 hours. Hgb A1c Recent Labs    06/14/20 0301  HGBA1C 6.1*   Lipid Profile Recent Labs    06/14/20 0301  CHOL 193  HDL 51  LDLCALC 124*  TRIG 91  CHOLHDL 3.8   Thyroid function studies Recent Labs    06/14/20 0949  TSH 0.744   Anemia work up Recent Labs    06/14/20 0949  VITAMINB12 479   Urinalysis    Component Value Date/Time   BILIRUBINUR negative 09/11/2019 1324   KETONESUR negative 09/11/2019 1324   PROTEINUR negative 09/11/2019 1324   UROBILINOGEN 0.2 09/11/2019 1324   NITRITE Negative 09/11/2019 1324   LEUKOCYTESUR Moderate (2+) (A) 09/11/2019 1324   Sepsis Labs Invalid input(s): PROCALCITONIN,  WBC,  LACTICIDVEN Microbiology Recent Results (from the past 240 hour(s))  SARS CORONAVIRUS 2 (TAT 6-24 HRS) Nasopharyngeal Nasopharyngeal Swab     Status: None   Collection Time: 06/13/20  5:20 PM   Specimen: Nasopharyngeal Swab  Result Value Ref Range Status   SARS Coronavirus 2 NEGATIVE NEGATIVE Final    Comment: (NOTE) SARS-CoV-2 target nucleic acids are NOT DETECTED.  The SARS-CoV-2 RNA is generally detectable in upper and lower respiratory specimens during the acute phase of infection. Negative results do not preclude SARS-CoV-2 infection, do not rule out co-infections with other pathogens, and should not be used as the sole basis for treatment or other patient management decisions. Negative results must be  combined with clinical observations, patient history, and epidemiological information. The expected result is Negative.  Fact Sheet for Patients: SugarRoll.be  Fact Sheet for Healthcare Providers: https://www.woods-mathews.com/  This test is not yet approved or cleared by the [place] FDA and  has been authorized for detection and/or diagnosis of SARS-CoV-2 by FDA under an Emergency Use Authorization (EUA). This EUA will remain  in effect (meaning this test can be used) for the duration of the COVID-19 declaration under Se ction 564(b)(1) of the Act, 21 U.S.C. section 360bbb-3(b)(1), unless the authorization is terminated or revoked sooner.  Performed at [facility], [place] [address] [place], [place] [address]      Time coordinating discharge: Over 30 minutes  SIGNED:   Phillips Climes, MD  [facility] 06/15/2020, 11:59 AM Pager   If 7PM-7AM, please contact night-coverage [email or web address] Password [email or web address]

## 2020-06-15 NOTE — TOC Benefit Eligibility Note (Signed)
Transition of Care [facility]) Benefit Eligibility Note    Patient Details  Name: Marrisa Kimber MRN: [id number] Date of Birth: 19-Feb-1945   Medication/Dose: Arne Cleveland  5 MG BID  Covered?: Yes  Tier: 2 Drug  Prescription Coverage [facility]erred [facility] [place]  Spoke with Person/Company/Phone Number:: AMY  @  [employer] #  [phone]  Co-Pay: $25.00  Prior Approval: No  Deductible: Met (OUT-OF-POCKET: MET)       Memory [place]ue Phone Number: 06/15/2020, 1:38 PM

## 2020-06-15 NOTE — Evaluation (Signed)
Occupational Therapy Evaluation Patient Details Name: Lauren [name] MRN: [id number] DOB: [birth year] Today's Date: 06/15/2020    History of Present Illness 75 yo female who presents with expressive aphasia, dysarthria, inability to read, L sided tingling. MRI: acute CVA of the L corona radiata and subcortical L parietal lobe. PMH: new a-flutter, LLD, pre DM, Sjogren's, depression, OSA.   Clinical Impression   PTA, pt was living alone and was independent. Currently, pt requires Supervision for ADLs and functional mobility. Pt presenting with decreased word finding and memory. Generally reports that symptoms have resolved with exception of speech deficits. Answered all pt questions. Recommend dc home once medically stable per physician; follow up with SLP at dc for cognition. All acute OT needs met and will sign off. Thank you.    Follow Up Recommendations  No OT follow up;Supervision - Intermittent (Follow up SLP)    Equipment Recommendations  None recommended by OT    Recommendations for Other Services Speech consult     Precautions / Restrictions Precautions Precautions: Fall      Mobility Bed Mobility               General bed mobility comments: In recliner upon arrival    Transfers Overall transfer level: Needs assistance Equipment used: None Transfers: Sit to/from Stand Sit to Stand: Supervision         General transfer comment: Supervision for safety    Balance Overall balance assessment: Needs assistance Sitting-balance support: No upper extremity supported;Feet supported Sitting balance-Leahy Scale: Good     Standing balance support: No upper extremity supported;During functional activity Standing balance-Leahy Scale: Good                             ADL either performed or assessed with clinical judgement   ADL Overall ADL's : Needs assistance/impaired                                       General ADL Comments: Pt  performing ADLs and functional mobility at Supervision level. Pt performing grooming and mobility in hallway.     Vision Baseline Vision/History:  (Cataract sx) Wears Glasses: At all times Patient Visual Report: No change from baseline       Perception     Praxis      Pertinent Vitals/Pain Pain Assessment: No/denies pain     Hand Dominance Right   Extremity/Trunk Assessment Upper Extremity Assessment Upper Extremity Assessment: Overall WFL for tasks assessed RUE Deficits / Details: Doing much better for coorindation   Lower Extremity Assessment Lower Extremity Assessment: Generalized weakness   Cervical / Trunk Assessment Cervical / Trunk Assessment: Normal   Communication Communication Communication: No difficulties   Cognition Arousal/Alertness: Awake/alert Behavior During Therapy: WFL for tasks assessed/performed Overall Cognitive Status: Within Functional Limits for tasks assessed                                 General Comments: Very agreeable to participate in therapy. Able to manage basic conversation. Able to name three animals that start with "S" with increased time. Recalling 1/3 ST memory words. Able to solve simple money management question.   General Comments  Daughter present throughout    Exercises     Shoulder Instructions      Home Living Family/patient  expects to be discharged to:: Private residence Living Arrangements: Alone Available Help at Discharge: Family;Available PRN/intermittently Type of Home: House Home Access: Level entry     Home Layout: One level     Bathroom Shower/Tub: Occupational psychologist: Handicapped height     Home Equipment: Environmental consultant - 2 wheels;Bedside commode          Prior Functioning/Environment Level of Independence: Independent        Comments: drives, retired Pharmacist, hospital. Enjoys caring for her cats and chickens        OT Problem List: Decreased activity tolerance;Impaired  balance (sitting and/or standing);Decreased knowledge of use of DME or AE;Decreased knowledge of precautions      OT Treatment/Interventions:      OT Goals(Current goals can be found in the care plan section) Acute Rehab OT Goals Patient Stated Goal: Go back home OT Goal Formulation: All assessment and education complete, DC therapy  OT Frequency:     Barriers to D/C:            Co-evaluation              AM-PAC OT "6 Clicks" Daily Activity     Outcome Measure Help from another person eating meals?: None Help from another person taking care of personal grooming?: A Little Help from another person toileting, which includes using toliet, bedpan, or urinal?: A Little Help from another person bathing (including washing, rinsing, drying)?: A Little Help from another person to put on and taking off regular upper body clothing?: A Little Help from another person to put on and taking off regular lower body clothing?: A Little 6 Click Score: 19   End of Session Nurse Communication: Mobility status  Activity Tolerance: Patient tolerated treatment well Patient left: in chair;with call bell/phone within reach;with chair alarm set;with family/visitor present  OT Visit Diagnosis: Other abnormalities of gait and mobility (R26.89);Unsteadiness on feet (R26.81);Muscle weakness (generalized) (M62.81)                Time: 5488-4573 OT Time Calculation (min): 22 min Charges:  OT General Charges $OT Visit: 1 Visit OT Evaluation $OT Eval Moderate Complexity: 1 Mod  Leslieann Whisman MSOT, OTR/L Acute Rehab Pager: [phone] Office: [place] 06/15/2020, 11:59 AM

## 2020-06-15 NOTE — TOC Initial Note (Signed)
Transition of Care [facility]) - Initial/Assessment Note    Patient Details  Name: Lauren [name] MRN: [id number] Date of Birth: 12-16-1945  Transition of [facility]) CM/SW Contact:    Ninfa Meeker, RN Phone Number: 06/15/2020, 11:50 AM  Clinical Narrative:  Case manager spoke with patient concerning discharge needs. Choice for Home Health agency offered. Referral called to Adela Lank, [facility] Liaison. Patient has RW/3in1 at home, has family support. CM has submitted Benefits request for Eliquis copay. Meds to be delivered by TOC.    Expected Discharge Plan: [place] Barriers to Discharge: No Barriers Identified   Patient Goals and CMS Choice Patient states their goals for this hospitalization and ongoing recovery are:: get better CMS Medicare.gov Compare Post Acute Care list provided to:: Patient Choice offered to / list presented to : Patient  Expected Discharge Plan and Services Expected Discharge Plan: [place] In-house Referral: NA Discharge Planning Services: CM Consult Post Acute Care Choice: [place] arrangements for the past 2 months: [place]                 DME Arranged: N/A (has RW/3in1) DME Agency: NA       HH Arranged: PT,OT [place] Agency: [place] Date [place]: 06/15/20 Time [place] Contacted: 59 Representative spoke with at [place]: Adela Lank  Prior Living Arrangements/Services Living arrangements for the past 2 months: [place] with:: Self Patient language and need for interpreter reviewed:: Yes Do you feel safe going back to the place where you live?: Yes      Need for Family Participation in Patient Care: Yes (Comment) Care giver support system in place?: Yes (comment) Current home services: DME Criminal Activity/Legal Involvement Pertinent to Current Situation/Hospitalization: No - Comment as needed  Activities of Daily Living Home Assistive  Devices/Equipment: None ADL Screening (condition at time of admission) Patient's cognitive ability adequate to safely complete daily activities?: Yes Is the patient deaf or have difficulty hearing?: No Does the patient have difficulty seeing, even when wearing glasses/contacts?: No Does the patient have difficulty concentrating, remembering, or making decisions?: Yes Patient able to express need for assistance with ADLs?: Yes Does the patient have difficulty dressing or bathing?: No Independently performs ADLs?: Yes (appropriate for developmental age) Does the patient have difficulty walking or climbing stairs?: No Weakness of Legs: None Weakness of Arms/Hands: Right  Permission Sought/Granted Permission sought to share information with : Case Manager       Permission granted to share info w AGENCY: [place]        Emotional Assessment       Orientation: : Oriented to Self,Oriented to Place,Oriented to  Time,Oriented to Situation Alcohol / Substance Use: Not Applicable Psych Involvement: Outpatient Provider  Admission diagnosis:  Acute ischemic stroke [facility]) [I63.9] Acute CVA (cerebrovascular accident) [facility]) [I63.9] Patient Active Problem List   Diagnosis Date Noted  . Acute CVA (cerebrovascular accident) ([place]) 06/13/2020  . DNR (do not resuscitate) 06/13/2020  . Elevated blood pressure reading 01/28/2020  . Carpal tunnel syndrome 01/25/2019  . Vitamin B12 deficiency 03/27/2018  . Sjogren's disease ([place]) 01/15/2018  . Osteoarthritis 01/15/2018  . Obesity (BMI 30-39.9) 01/15/2018  . H/O herpes zoster 11/22/2017  . Estrogen deficiency 01/09/2017  . Routine general medical examination at a health care facility 01/01/2017  . OSA (obstructive sleep apnea) 09/18/2016  . Colon cancer screening 12/24/2014  . Bee sting allergy 06/10/2014  .  Encounter for Medicare annual wellness exam 12/03/2013  . History of shingles 11/27/2013  . Hx of breast cancer 08/22/2011  . Gynecological  examination 02/14/2011  . Bilateral dry eyes 01/20/2011  . Vitamin D deficiency 04/22/2008  . Hyperlipidemia 04/22/2008  . GERD 04/22/2008  . Prediabetes 04/22/2008   PCP:  Abner Greenspan, MD Pharmacy:   [facility] [address] [address] Phone: [phone] Fax: [phone]     Social Determinants of Health (SDOH) Interventions    Readmission Risk Interventions No flowsheet data found.

## 2020-06-15 NOTE — Progress Notes (Signed)
Discussed with [facility] cardiology coordinator, They will arrange for outpatient cardiology follow-up at [facility] clinic for new onset A. Fib. Phillips Climes MD

## 2020-06-16 ENCOUNTER — Telehealth (HOSPITAL_COMMUNITY): Payer: Self-pay | Admitting: Pharmacist

## 2020-06-16 ENCOUNTER — Other Ambulatory Visit (HOSPITAL_COMMUNITY): Payer: Self-pay

## 2020-06-16 ENCOUNTER — Telehealth: Payer: Self-pay

## 2020-06-16 NOTE — Telephone Encounter (Signed)
Transition Care Management Follow-up Telephone Call  Date of discharge and from where: [facility]15/[facility]   How have you been since you were released from the hospital? Patient states that she feels much better.   Any questions or concerns? No  Items Reviewed:  Did the pt receive and understand the discharge instructions provided? Yes   Medications obtained and verified? Yes   Other? No   Any new allergies since your discharge? No   Dietary orders reviewed? Yes  Do you have support at home? Yes   Home Care and Equipment/Supplies: Were home health services ordered? not applicable If so, what is the name of the agency? N/A  Has the agency set up a time to come to the patient's home? not applicable Were any new equipment or medical supplies ordered?  No What is the name of the medical supply agency? N/A Were you able to get the supplies/equipment? not applicable Do you have any questions related to the use of the equipment or supplies? No  Functional Questionnaire: (I = Independent and D = Dependent) ADLs: I  Bathing/Dressing- I  Meal Prep- I  Eating- I  Maintaining continence- I  Transferring/Ambulation- I  Managing Meds- I  Follow up appointments reviewed:   PCP Hospital f/u appt confirmed? Yes  Scheduled to see Dr. Glori Bickers on 06/19/2020 @ 12 pm.  [place] [facility] f/u appt confirmed? Yes  Scheduled to see Dr. Fletcher Anon on 06/25/2020 @ 10 am.  Are transportation arrangements needed? No   If their condition worsens, is the pt aware to call PCP or go to the Emergency Dept.? Yes  Was the patient provided with contact information for the PCP's office or ED? Yes  Was to pt encouraged to call back with questions or concerns? Yes

## 2020-06-16 NOTE — Telephone Encounter (Signed)
Transition Care Management Unsuccessful Follow-up Telephone Call  Date of discharge and from where:  06/15/2020, Lauren [name]  Attempts:  1st Attempt  Reason for unsuccessful TCM follow-up call:  Left voice message

## 2020-06-16 NOTE — Telephone Encounter (Signed)
Transition Care Management Unsuccessful Follow-up Telephone Call  Date of discharge and from where:  [facility]  Attempts:  2nd Attempt  Reason for unsuccessful TCM follow-up call:  No answer/busy

## 2020-06-16 NOTE — Telephone Encounter (Signed)
Pharmacy Transitions of Care Follow-up Telephone Call  Date of discharge: 06/15/20  Discharge Diagnosis:new onset afib/aflutter with  acute stroke  How have you been since you were released from the hospital?  Wonderful   Medication changes made at discharge:  - START: Eliquis and Metoprolol  - STOPPED: amoxicillin and celecoxib  - CHANGED: n/a  Medication changes verified by the patient?  Yes, confirmed she is taking these correctly, no issues, and understands indication    Medication Accessibility:  Home Pharmacy:  [facility] [facility], but patient is interested in using [facility] for next refill. Will have MD send new Rx for Eliquis and will call [facility] for Metoprolol refill when needed.  I have provided phone number for [facility].   Was the patient provided with refills on discharged medications? Only on metoprolol  Have all prescriptions been transferred from [facility] to home pharmacy?  n/a  Is the patient able to afford medications? yes . Notable copays: Eliquis . Eligible patient assistance: medicare plan    Medication Review:   APIXABAN (ELIQUIS)  Apixaban 5 mg BID initiated on 06/14/20, for DVT prophylaxis due to a.fib  - Discussed importance of taking medication around the same time everyday  - Reviewed potential DDIs with patient  - Advised patient of medications to avoid (NSAIDs, ASA)  - Educated that Tylenol (acetaminophen) will be the preferred analgesic to prevent risk of bleeding  - Emphasized importance of monitoring for signs and symptoms of bleeding (abnormal bruising, prolonged bleeding, nose bleeds, bleeding from gums, discolored urine, black tarry stools)  - Advised patient to alert all providers of anticoagulation therapy prior to starting a new medication or having a procedure   Follow-up Appointments:  PCP Hospital f/u appt confirmed? Scheduled to see Loura Pardon on Friday 06/19/20 @ 12:00   Specialist Hospital f/u appt confirmed?  Scheduled to see Dr.  Fletcher Anon on Thursday 5/19 @  10am  If their condition worsens, is the pt aware to call PCP or go to the Emergency Dept.? Yes  Final Patient Assessment: It was a pleasure to speak with this patient.  She was very well informed and demonstrates understanding of her new medications.  She was very pleased with her recent hospital experience, especially pharmacy and TOC bedside delivery.

## 2020-06-19 ENCOUNTER — Ambulatory Visit (INDEPENDENT_AMBULATORY_CARE_PROVIDER_SITE_OTHER): Payer: Medicare Other | Admitting: Family Medicine

## 2020-06-19 ENCOUNTER — Encounter: Payer: Self-pay | Admitting: Family Medicine

## 2020-06-19 ENCOUNTER — Other Ambulatory Visit: Payer: Self-pay

## 2020-06-19 VITALS — BP 122/80 | HR 82 | Temp 96.9°F | Ht 67.5 in | Wt 251.0 lb

## 2020-06-19 DIAGNOSIS — E7800 Pure hypercholesterolemia, unspecified: Secondary | ICD-10-CM | POA: Diagnosis not present

## 2020-06-19 DIAGNOSIS — G4733 Obstructive sleep apnea (adult) (pediatric): Secondary | ICD-10-CM

## 2020-06-19 DIAGNOSIS — E669 Obesity, unspecified: Secondary | ICD-10-CM

## 2020-06-19 DIAGNOSIS — Z8673 Personal history of transient ischemic attack (TIA), and cerebral infarction without residual deficits: Secondary | ICD-10-CM | POA: Diagnosis not present

## 2020-06-19 DIAGNOSIS — R7303 Prediabetes: Secondary | ICD-10-CM | POA: Diagnosis not present

## 2020-06-19 NOTE — Patient Instructions (Addendum)
Continue current medicines  Take care of yourself   Increase activity as tolerated Eat a healthy diet   Take a look at the [facility] eating plan   Discuss medications with cardiology re: cholesterol

## 2020-06-19 NOTE — Progress Notes (Signed)
Subjective:    Patient ID: Lauren [name], female    DOB: 04-01-1945, 75 y.o.   MRN: [id number]  This visit occurred during the SARS-CoV-2 public health emergency.  Safety protocols were in place, including screening questions prior to the visit, additional usage of staff PPE, and extensive cleaning of exam room while observing appropriate contact time as indicated for disinfecting solutions.    HPI  Pt presents for f/u of hospitalization for CVA  Wt Readings from Last 3 Encounters:  06/19/20 251 lb (113.9 kg)  06/13/20 257 lb 15 oz (117 kg)  04/16/20 251 lb 3.2 oz (113.9 kg)   38.73 kg/m  Pt was hospitalized from 5/7 to 06/15/20 for CVA She presented after difficulty reading and some dysarthria with tingling in L foot and side  Work up was significant for new onset a fib/flutter and acute CVA on MRI  hosp course as follows:  CVA -MRI brain significant for acute infarct of the left corona radiata, and subcortical left parietal lobe, normal intracranial MRA, no significant occlusion in carotid or vertebral arteries, with a preserved EF, no significant valvular disease, work-up significant for new onset A. fib, she remains with persistent A. fib during hospital stay, neurology input greatly appreciated, she was started on Eliquis. -PT/OT/SLP seen patient during hospital stay, will arrange for home health on discharge.  She has appt with neuro Dr Manuella Ghazi on 7/5  occ trouble saying a word  Otherwise close to normal  Has been taking it easy  PT came once and did not need anything   Did laundry today and mopped floor    New onset afib/flutter -Patient with apparent discussion about afib during pulm appt in March, told to f/u with PCP -She remains in persistent A. fib during hospital stay, heart rate is elevated, so she was started on low-dose metoprolol 12.5 mg p.o. twice daily, with overall good control, she will be discharged on current dose . -TSH within normal limit . -She is  started on Eliquis . -Discussed with [facility] coordinator, they will arrange for outpatient follow-up with cardiology at [facility] office .  She has appt with cardiology Dr Fletcher Anon on 5/19   She was feeling lots of palpitations and now on the beta blocker and no longer feels them   Of note she had covid in feb and had irreg heart beat after that  Overall some chest congestion-it was not overly bad   HTN -Allow permissive HTNduring hospital stay, overall her blood pressure has been controlled, she is not on any home medications, she was started on metoprolol mainly for heart rate control.  BP Readings from Last 3 Encounters:  06/19/20 122/80  06/15/20 128/73  04/16/20 122/80     Pulse Readings from Last 3 Encounters:  06/19/20 82  06/15/20 89  04/16/20 86    HLD -Reported statin intolerance -LDL is 124, continue Zetia for now, will need  referral for [facility] as an outpatient  Lab Results  Component Value Date   CHOL 193 06/14/2020   HDL 51 06/14/2020   LDLCALC 124 (H) 06/14/2020   LDLDIRECT 148.9 08/20/2012   TRIG 91 06/14/2020   CHOLHDL 3.8 06/14/2020    Pre-DM -PriorA1c shows reasonably good control, 6.1 in 02/2020 -Will order moderate-scale SSI  Sjogren's -Continue pilocarpine  Depression -Continue Prozac  Obesity -Body mass index is 39.22 kg/m..  -Weight loss should be encouraged -Outpatient PCP/bariatric medicine f/u encouraged  OSA -Resume CPAP at the time of d/c  Eating a lot  better now  Not eating very much/smaller portions and healthy stuff Has done it before   Patient Active Problem List   Diagnosis Date Noted  . History of CVA (cerebrovascular accident) 06/13/2020  . DNR (do not resuscitate) 06/13/2020  . Elevated blood pressure reading 01/28/2020  . Carpal tunnel syndrome 01/25/2019  . Vitamin B12 deficiency 03/27/2018  . Sjogren's disease ([place]) 01/15/2018  . Osteoarthritis 01/15/2018  . Obesity (BMI 30-39.9) 01/15/2018  . H/O  herpes zoster 11/22/2017  . Estrogen deficiency 01/09/2017  . Routine general medical examination at a health care facility 01/01/2017  . OSA (obstructive sleep apnea) 09/18/2016  . Colon cancer screening 12/24/2014  . Bee sting allergy 06/10/2014  . Encounter for Medicare annual wellness exam 12/03/2013  . History of shingles 11/27/2013  . Hx of breast cancer 08/22/2011  . Gynecological examination 02/14/2011  . Bilateral dry eyes 01/20/2011  . Vitamin D deficiency 04/22/2008  . Hyperlipidemia 04/22/2008  . GERD 04/22/2008  . Prediabetes 04/22/2008   Past Medical History:  Diagnosis Date  . Breast cancer ([place]) 2002   DCIS; BRCA 1 pos HER2 pos, ER2 pos  . DDD (degenerative disc disease)   . DNR (do not resuscitate) 06/13/2020  . GERD (gastroesophageal reflux disease)   . HLD (hyperlipidemia)    statin intolerant  . Hyperglycemia 9/09   A1C elvated to 6.2  . Osteoarthritis   . Osteopenia 2007   ? from xray with nl dexa  . Reactive depression (situational)    very well controlled, and now takes prozac for sleep  . Sjogren's syndrome ([place])   . Urine incontinence   . Vitamin D deficiency    Past Surgical History:  Procedure Laterality Date  . BILATERAL OOPHORECTOMY  205  . CATARACT EXTRACTION, BILATERAL     10/2019, 12/2019  . COLONOSCOPY  5/07   normal; recheck 3 years  . LUMBAR DISC SURGERY  1991   Ruptured disk L5  . MASTECTOMY  2001   bilateral  . TONSILLECTOMY  1974  . TOTAL HIP ARTHROPLASTY  2011  . TOTAL KNEE ARTHROPLASTY  2006   bilateral   Social History   Tobacco Use  . Smoking status: Never Smoker  . Smokeless tobacco: Never Used  Vaping Use  . Vaping Use: Never used  Substance Use Topics  . Alcohol use: Not Currently    Alcohol/week: 0.0 standard drinks    Comment: rare, none in 2 years  . Drug use: No   Family History  Problem Relation Age of Onset  . Arthritis Other        family hx  . Breast cancer Other        1st degree relative <50  .  Diabetes Other        1st degree relative  . Hyperlipidemia Other        family hx  . Hypertension Other        family hx  . Ovarian cancer Other        family hx  . Prostate cancer Other        1st degree relative <50  . Dementia Mother   . Psoriasis Brother   . Other Daughter        BRCA gene  . Brain cancer Brother   . Stroke Maternal Grandmother    Allergies  Allergen Reactions  . Bee Venom   . Statins     REACTION: joint pain   Current Outpatient Medications on File Prior to Visit  Medication Sig Dispense Refill  . apixaban (ELIQUIS) 5 MG TABS tablet Take 1 tablet (5 mg total) by mouth 2 (two) times daily. 60 tablet 0  . cetirizine (ZYRTEC) 10 MG tablet Take 10 mg by mouth at bedtime.    . cholecalciferol (VITAMIN D) 1000 units tablet Take 1,000 Units by mouth daily.    Marland Kitchen EPINEPHrine 0.3 mg/0.3 mL IJ SOAJ injection Inject 0.3 mLs (0.3 mg total) into the muscle once. 1 Device 5  . ezetimibe (ZETIA) 10 MG tablet Take 1 tablet (10 mg total) by mouth daily. (Patient taking differently: Take 10 mg by mouth at bedtime.) 90 tablet 3  . Fish Oil-Cholecalciferol (OMEGA-3 + VITAMIN D3 PO) Take 2 capsules by mouth daily.     Marland Kitchen FLUoxetine (PROZAC) 20 MG capsule Take 1 capsule (20 mg total) by mouth daily. 90 capsule 3  . gabapentin (NEURONTIN) 300 MG capsule Take 1 capsule (300 mg total) by mouth 2 (two) times daily. 180 capsule 3  . metoprolol tartrate (LOPRESSOR) 25 MG tablet Take 0.5 tablets (12.5 mg total) by mouth 2 (two) times daily. 30 tablet 1  . omeprazole (PRILOSEC) 20 MG capsule Take 20 mg by mouth daily.    Marland Kitchen oxybutynin (OXYTROL) 3.9 MG/24HR Place 1 patch onto the skin 2 (two) times a week. (Patient taking differently: Place 1 patch onto the skin 2 (two) times a week. Sundays and Wednesdays) 24 patch 3  . pilocarpine (SALAGEN) 5 MG tablet Take 1 tablet (5 mg total) by mouth 3 (three) times daily. 270 tablet 3   No current facility-administered medications on file prior to  visit.    Review of Systems  Constitutional: Negative for activity change, appetite change, fatigue, fever and unexpected weight change.  HENT: Negative for congestion, ear pain, rhinorrhea, sinus pressure and sore throat.   Eyes: Negative for pain, redness and visual disturbance.  Respiratory: Negative for cough, shortness of breath and wheezing.   Cardiovascular: Negative for chest pain and palpitations.  Gastrointestinal: Negative for abdominal pain, blood in stool, constipation and diarrhea.  Endocrine: Negative for polydipsia and polyuria.  Genitourinary: Negative for dysuria, frequency and urgency.  Musculoskeletal: Negative for arthralgias, back pain and myalgias.  Skin: Negative for pallor and rash.  Allergic/Immunologic: Negative for environmental allergies.  Neurological: Negative for dizziness, syncope and headaches.  Hematological: Negative for adenopathy. Does not bruise/bleed easily.  Psychiatric/Behavioral: Negative for decreased concentration and dysphoric mood. The patient is not nervous/anxious.        Objective:   Physical Exam Constitutional:      General: She is not in acute distress.    Appearance: Normal appearance. She is well-developed. She is obese. She is not ill-appearing.  HENT:     Head: Normocephalic and atraumatic.  Eyes:     Conjunctiva/sclera: Conjunctivae normal.     Pupils: Pupils are equal, round, and reactive to light.  Neck:     Thyroid: No thyromegaly.     Vascular: No carotid bruit or JVD.  Cardiovascular:     Rate and Rhythm: Normal rate and regular rhythm.     Heart sounds: Normal heart sounds. No gallop.   Pulmonary:     Effort: Pulmonary effort is normal. No respiratory distress.     Breath sounds: Normal breath sounds. No wheezing or rales.  Abdominal:     General: Bowel sounds are normal. There is no distension or abdominal bruit.     Palpations: Abdomen is soft. There is no mass.     Tenderness:  There is no abdominal  tenderness.  Musculoskeletal:     Cervical back: Normal range of motion and neck supple.     Right lower leg: No edema.     Left lower leg: No edema.  Lymphadenopathy:     Cervical: No cervical adenopathy.  Skin:    General: Skin is warm and dry.     Coloration: Skin is not pale.     Findings: No erythema or rash.  Neurological:     Mental Status: She is alert.     Cranial Nerves: No cranial nerve deficit.     Sensory: No sensory deficit.     Motor: No weakness.     Coordination: Coordination normal.     Gait: Gait normal.     Deep Tendon Reflexes: Reflexes are normal and symmetric. Reflexes normal.  Psychiatric:        Mood and Affect: Mood normal.           Assessment & Plan:   Problem List Items Addressed This Visit      Respiratory   OSA (obstructive sleep apnea)    Continues cpap        Other   Hyperlipidemia    Disc goals for lipids and reasons to control them Rev last labs with pt Rev low sat fat diet in detail Intol of statins, taking zetia For cardiology visit- may discuss pcyk9 inhibitor to get LDL under 70       Prediabetes    Lab Results  Component Value Date   HGBA1C 6.1 (H) 06/14/2020   disc imp of low glycemic diet and wt loss to prevent DM2  She is eating better now       Obesity (BMI 30-39.9)    Discussed how this problem influences overall health and the risks it imposes  Reviewed plan for weight loss with lower calorie diet (via better food choices and also portion control or program like weight watchers) and exercise building up to or more than 30 minutes 5 days per week including some aerobic activity         History of CVA (cerebrovascular accident) - Primary    Recent hosp admission and doing well  Reviewed hospital records, lab results and studies in detail   Tolerating eliquis in setting of a fib Neurology and cardiology appt pending  bp is well controlled  May need to consider alt tx for cholesterol with LDL of 124 and  goal under 70

## 2020-06-20 NOTE — Assessment & Plan Note (Signed)
Disc goals for lipids and reasons to control them Rev last labs with pt Rev low sat fat diet in detail Intol of statins, taking zetia For cardiology visit- may discuss pcyk9 inhibitor to get LDL under 70

## 2020-06-20 NOTE — Assessment & Plan Note (Signed)
Continues cpap.  

## 2020-06-20 NOTE — Assessment & Plan Note (Signed)
Discussed how this problem influences overall health and the risks it imposes  Reviewed plan for weight loss with lower calorie diet (via better food choices and also portion control or program like weight watchers) and exercise building up to or more than 30 minutes 5 days per week including some aerobic activity    

## 2020-06-20 NOTE — Assessment & Plan Note (Signed)
Lab Results  Component Value Date   HGBA1C 6.1 (H) 06/14/2020   disc imp of low glycemic diet and wt loss to prevent DM2  She is eating better now

## 2020-06-20 NOTE — Assessment & Plan Note (Signed)
Recent hosp admission and doing well  Reviewed hospital records, lab results and studies in detail   Tolerating eliquis in setting of a fib Neurology and cardiology appt pending  bp is well controlled  May need to consider alt tx for cholesterol with LDL of 124 and goal under 70

## 2020-06-25 ENCOUNTER — Other Ambulatory Visit: Payer: Self-pay

## 2020-06-25 ENCOUNTER — Other Ambulatory Visit (HOSPITAL_COMMUNITY): Payer: Self-pay

## 2020-06-25 ENCOUNTER — Ambulatory Visit (INDEPENDENT_AMBULATORY_CARE_PROVIDER_SITE_OTHER): Payer: Medicare Other | Admitting: Cardiovascular Disease

## 2020-06-25 ENCOUNTER — Encounter: Payer: Self-pay | Admitting: Cardiovascular Disease

## 2020-06-25 VITALS — BP 120/80 | HR 94 | Ht 68.0 in | Wt 250.2 lb

## 2020-06-25 DIAGNOSIS — E7800 Pure hypercholesterolemia, unspecified: Secondary | ICD-10-CM

## 2020-06-25 DIAGNOSIS — I4819 Other persistent atrial fibrillation: Secondary | ICD-10-CM | POA: Diagnosis not present

## 2020-06-25 DIAGNOSIS — I639 Cerebral infarction, unspecified: Secondary | ICD-10-CM

## 2020-06-25 MED ORDER — APIXABAN 5 MG PO TABS
5.0000 mg | ORAL_TABLET | Freq: Two times a day (BID) | ORAL | 1 refills | Status: DC
Start: 2020-06-25 — End: 2020-06-25
  Filled 2020-06-25: qty 60, 30d supply, fill #0

## 2020-06-25 MED ORDER — ROSUVASTATIN CALCIUM 5 MG PO TABS: 5.0000 mg | ORAL_TABLET | Freq: Every day | ORAL | 1 refills | Status: DC

## 2020-06-25 MED ORDER — METOPROLOL TARTRATE 25 MG PO TABS: 25.0000 mg | ORAL_TABLET | Freq: Two times a day (BID) | ORAL | 1 refills | Status: DC

## 2020-06-25 MED ORDER — APIXABAN 5 MG PO TABS: 5.0000 mg | ORAL_TABLET | Freq: Two times a day (BID) | ORAL | 1 refills | Status: DC

## 2020-06-25 NOTE — Telephone Encounter (Signed)
62f, 113.5kg, scr 0.69, arida 06/25/20

## 2020-06-25 NOTE — Patient Instructions (Signed)
Medication Instructions:  Your physician has recommended you make the following change in your medication:   CONTINUE Elqiuis 5 mg daily  INCREASE Metoprolol to 25 mg twice daily  START Crestor (rosuvastatin) 5 mg daily  *If you need a refill on your cardiac medications before your next appointment, please call your pharmacy*   Lab Work: Your physician recommends that you return for a FASTING lipid profile and hepatic in 6 weeks. Please have your labs drawn at the [facility] medical mall. No appt needed. Lab hours are Mon-Fri 7:30am-6pm.  If you have labs (blood work) drawn today and your tests are completely normal, you will receive your results only by: Marland Kitchen MyChart Message (if you have MyChart) OR . A paper copy in the mail If you have any lab test that is abnormal or we need to change your treatment, we will call you to review the results.   Testing/Procedures: None ordered   Follow-Up: At [facility], you and your health needs are our priority.  As part of our continuing mission to provide you with exceptional heart care, we have created designated Provider Care Teams.  These Care Teams include your primary Cardiologist (physician) and Advanced Practice Providers (APPs -  Physician Assistants and Nurse Practitioners) who all work together to provide you with the care you need, when you need it.  We recommend signing up for the patient portal called "MyChart".  Sign up information is provided on this After Visit Summary.  MyChart is used to connect with patients for Virtual Visits (Telemedicine).  Patients are able to view lab/test results, encounter notes, upcoming appointments, etc.  Non-urgent messages can be sent to your provider as well.   To learn more about what you can do with MyChart, go to [email or web address].    Your next appointment:   2 month(s)  The format for your next appointment:   In Person  Provider:   You may see Kathlyn Sacramento, MD or one of the following  Advanced Practice Providers on your designated Care Team:    Murray Hodgkins, NP  Christell Faith, PA-C  Marrianne Mood, PA-C  Cadence [place], [place]  Laurann Montana, NP    Other Instructions N/A

## 2020-06-25 NOTE — Addendum Note (Signed)
Addended by: Allean Found on: 06/25/2020 12:43 PM   Modules accepted: Orders

## 2020-06-25 NOTE — Progress Notes (Signed)
Cardiology Office Note   Date:  06/25/2020   ID:  Lauren [name], DOB [birth year], MRN [id number]  PCP:  Abner Greenspan, MD  Cardiologist:   Kathlyn Sacramento, MD   Chief Complaint  Patient presents with  . Other    Afib/CVA no complaints today. Meds reviewed verbally with pt.      History of Present Illness: Lauren [name] is a 75 y.o. female who was referred for evaluation of atrial fibrillation. She has past medical history of breast cancer status post bilateral mastectomy, hyperlipidemia with intolerance to statins, borderline diabetes, osteopenia, reactive depression, Sjogren's syndrome, urinary incontinence and vitamin D deficiency. She was hospitalized recently with acute onset of slurred speech, facial droop and aphasia.  MRI confirmed the stroke.  EKG showed atrial fibrillation.  She was started on anticoagulation with Eliquis.  Her stroke was thought to be embolic.  Echocardiogram showed normal LV systolic function without significant valvular abnormalities.  Left atrium was mildly dilated.  She reports palpitations since February.  She was seen by pulmonary in March and was told that her heart rhythm was irregular.  She denies any chest pain or shortness of breath she has minimal palpitations at this time.  She has no prior history of essential hypertension, heart failure or diabetes.  She is not a smoker.  She has been doing well since her discharge and has for the most part recovered from her stroke.  She reports trying statins more than 20 years ago and had severe myalgia but has not been rechallenged since then.  Past Medical History:  Diagnosis Date  . Breast cancer ([place]) 2002   DCIS; BRCA 1 pos HER2 pos, ER2 pos  . DDD (degenerative disc disease)   . DNR (do not resuscitate) 06/13/2020  . GERD (gastroesophageal reflux disease)   . HLD (hyperlipidemia)    statin intolerant  . Hyperglycemia 9/09   A1C elvated to 6.2  . Osteoarthritis   . Osteopenia 2007   ? from xray  with nl dexa  . Reactive depression (situational)    very well controlled, and now takes prozac for sleep  . Sjogren's syndrome ([place])   . Stroke ([place])   . Urine incontinence   . Vitamin D deficiency     Past Surgical History:  Procedure Laterality Date  . BILATERAL OOPHORECTOMY  205  . CATARACT EXTRACTION, BILATERAL     10/2019, 12/2019  . COLONOSCOPY  5/07   normal; recheck 3 years  . LUMBAR DISC SURGERY  1991   Ruptured disk L5  . MASTECTOMY  2001   bilateral  . TONSILLECTOMY  1974  . TOTAL HIP ARTHROPLASTY  2011  . TOTAL KNEE ARTHROPLASTY  2006   bilateral     Current Outpatient Medications  Medication Sig Dispense Refill  . apixaban (ELIQUIS) 5 MG TABS tablet Take 1 tablet (5 mg total) by mouth 2 (two) times daily. 60 tablet 0  . cetirizine (ZYRTEC) 10 MG tablet Take 10 mg by mouth at bedtime.    Marland Kitchen EPINEPHrine 0.3 mg/0.3 mL IJ SOAJ injection Inject 0.3 mLs (0.3 mg total) into the muscle once. 1 Device 5  . ezetimibe (ZETIA) 10 MG tablet Take 1 tablet (10 mg total) by mouth daily. 90 tablet 3  . Fish Oil-Cholecalciferol (OMEGA-3 + VITAMIN D3 PO) Take 2 capsules by mouth daily.     Marland Kitchen FLUoxetine (PROZAC) 20 MG capsule Take 1 capsule (20 mg total) by mouth daily. 90 capsule 3  . gabapentin (NEURONTIN) 300  MG capsule Take 1 capsule (300 mg total) by mouth 2 (two) times daily. 180 capsule 3  . metoprolol tartrate (LOPRESSOR) 25 MG tablet Take 0.5 tablets (12.5 mg total) by mouth 2 (two) times daily. 30 tablet 1  . omeprazole (PRILOSEC) 20 MG capsule Take 20 mg by mouth daily.    Marland Kitchen oxybutynin (OXYTROL) 3.9 MG/24HR Place 1 patch onto the skin 2 (two) times a week. 24 patch 3  . pilocarpine (SALAGEN) 5 MG tablet Take 1 tablet (5 mg total) by mouth 3 (three) times daily. 270 tablet 3   No current facility-administered medications for this visit.    Allergies:   Bee venom and Statins    Social History:  The patient  reports that she has never smoked. She has never used  smokeless tobacco. She reports previous alcohol use. She reports that she does not use drugs.   Family History:  The patient's family history includes Arthritis in an other family member; Brain cancer in her brother; Breast cancer in an other family member; Dementia in her mother; Diabetes in an other family member; Hyperlipidemia in an other family member; Hypertension in an other family member; Other in her daughter; Ovarian cancer in an other family member; Prostate cancer in an other family member; Psoriasis in her brother; Stroke in her maternal grandmother.    ROS:  Please see the history of present illness.   Otherwise, review of systems are positive for none.   All other systems are reviewed and negative.    PHYSICAL EXAM: VS:  BP 120/80 (BP Location: Right Arm, Patient Position: Sitting, Cuff Size: Large)   Pulse 94   Ht _0  (1.727 m)   Wt 250 lb 4 oz (113.5 kg)   SpO2 97%   BMI 38.05 kg/m  , BMI Body mass index is 38.05 kg/m. GEN: Well nourished, well developed, in no acute distress  HEENT: normal  Neck: no JVD, carotid bruits, or masses Cardiac: Irregularly irregular; no murmurs, rubs, or gallops,no edema  Respiratory:  clear to auscultation bilaterally, normal work of breathing GI: soft, nontender, nondistended, + BS MS: no deformity or atrophy  Skin: warm and dry, no rash Neuro:  Strength and sensation are intact Psych: euthymic mood, full affect   EKG:  EKG is ordered today. The ekg ordered today demonstrates atrial fibrillation with ventricular rate of 94 bpm.  Nonspecific ST changes.   Recent Labs: 06/13/2020: ALT 16 06/14/2020: TSH 0.744 06/15/2020: BUN 13; Creatinine, Ser 0.59; Hemoglobin 14.2; Magnesium 2.0; Platelets 209; Potassium 3.9; Sodium 137    Lipid Panel    Component Value Date/Time   CHOL 193 06/14/2020 0301   TRIG 91 06/14/2020 0301   HDL 51 06/14/2020 0301   CHOLHDL 3.8 06/14/2020 0301   VLDL 18 06/14/2020 0301   LDLCALC 124 (H) 06/14/2020  0301   LDLDIRECT 148.9 08/20/2012 0831      Wt Readings from Last 3 Encounters:  06/25/20 250 lb 4 oz (113.5 kg)  06/19/20 251 lb (113.9 kg)  06/13/20 257 lb 15 oz (117 kg)       PAD Screen 06/25/2020  Previous PAD dx? No  Previous surgical procedure? No  Pain with walking? No  Feet/toe relief with dangling? No  Painful, non-healing ulcers? No  Extremities discolored? No      ASSESSMENT AND PLAN:  1.  Persistent atrial fibrillation: Ventricular rate is somewhat on the high side.  I elected to increase metoprolol to 25 mg twice daily.  CHA2DS2-VASc score  is 5.  Recommend continuing long-term anticoagulation with Eliquis.  She seems to be tolerating the medication. She seems to have minimal symptoms related to atrial fibrillation other than mild palpitations.  Nonetheless, it might be worth proceeding with cardioversion at least once.  I will see her back in 2 months and reevaluate.  2.  Hyperlipidemia: She is currently on Zetia and her LDL is close to 120.  Given recent stroke, recommend a target LDL of less than 70.  She has not been rechallenged with any statin in more than 20 years.  I elected to start on rosuvastatin 5 mg once daily.  If she is not able to tolerate this, recommend treatment with a PCSK9 inhibitor.  Check lipid and liver profile in 6 weeks.  3.  Recent stroke: Likely embolic.  She seems to have recovered from her deficits.  She has a follow-up appointment with neurology.   Disposition:   FU with me in 2 months  Signed,  Kathlyn Sacramento, MD  06/25/2020 10:06 AM    [place]

## 2020-07-17 DIAGNOSIS — D225 Melanocytic nevi of trunk: Secondary | ICD-10-CM | POA: Diagnosis not present

## 2020-07-17 DIAGNOSIS — D2261 Melanocytic nevi of right upper limb, including shoulder: Secondary | ICD-10-CM | POA: Diagnosis not present

## 2020-07-17 DIAGNOSIS — L728 Other follicular cysts of the skin and subcutaneous tissue: Secondary | ICD-10-CM | POA: Diagnosis not present

## 2020-07-17 DIAGNOSIS — L538 Other specified erythematous conditions: Secondary | ICD-10-CM | POA: Diagnosis not present

## 2020-07-17 DIAGNOSIS — D2271 Melanocytic nevi of right lower limb, including hip: Secondary | ICD-10-CM | POA: Diagnosis not present

## 2020-07-17 DIAGNOSIS — D2262 Melanocytic nevi of left upper limb, including shoulder: Secondary | ICD-10-CM | POA: Diagnosis not present

## 2020-07-17 DIAGNOSIS — B078 Other viral warts: Secondary | ICD-10-CM | POA: Diagnosis not present

## 2020-07-17 DIAGNOSIS — D2272 Melanocytic nevi of left lower limb, including hip: Secondary | ICD-10-CM | POA: Diagnosis not present

## 2020-08-05 ENCOUNTER — Other Ambulatory Visit
Admission: RE | Admit: 2020-08-05 | Discharge: 2020-08-05 | Disposition: A | Discharge status: Home / Self Care | Payer: Medicare Other | Attending: Cardiovascular Disease | Admitting: Cardiovascular Disease

## 2020-08-05 DIAGNOSIS — E7800 Pure hypercholesterolemia, unspecified: Secondary | ICD-10-CM | POA: Diagnosis not present

## 2020-08-05 LAB — LIPID PANEL
Cholesterol: 177 mg/dL (ref 0–200)
HDL: 46 mg/dL (ref >40)
LDL Cholesterol: 114 mg/dL — ABNORMAL HIGH (ref 0–99)
Total CHOL/HDL Ratio: 3.8 RATIO
Triglycerides: 83 mg/dL (ref <150)
VLDL: 17 mg/dL (ref 0–40)

## 2020-08-05 LAB — HEPATIC FUNCTION PANEL
ALT: 15 U/L (ref 0–44)
AST: 17 U/L (ref 15–41)
Albumin: 3.9 g/dL (ref 3.5–5.0)
Alkaline Phosphatase: 67 U/L (ref 38–126)
Bilirubin, Direct: <0.1 mg/dL (ref 0.0–0.2)
Total Bilirubin: 0.7 mg/dL (ref 0.3–1.2)
Total Protein: 7.1 g/dL (ref 6.5–8.1)

## 2020-08-06 ENCOUNTER — Telehealth: Payer: Self-pay

## 2020-08-06 NOTE — Telephone Encounter (Signed)
-----   Message from Wellington Hampshire, MD sent at 08/06/2020 12:05 PM EDT ----- Inform patient that labs were normal.  Cholesterol improved but still not at target.  If she is tolerating small dose rosuvastatin, I recommend increasing the dose to 10 mg daily.

## 2020-08-06 NOTE — Telephone Encounter (Signed)
Called the patient with results. Lmtcb.

## 2020-08-11 DIAGNOSIS — M1990 Unspecified osteoarthritis, unspecified site: Secondary | ICD-10-CM | POA: Diagnosis not present

## 2020-08-11 DIAGNOSIS — G4733 Obstructive sleep apnea (adult) (pediatric): Secondary | ICD-10-CM | POA: Diagnosis not present

## 2020-08-11 DIAGNOSIS — R9082 White matter disease, unspecified: Secondary | ICD-10-CM | POA: Diagnosis not present

## 2020-08-11 DIAGNOSIS — Z853 Personal history of malignant neoplasm of breast: Secondary | ICD-10-CM | POA: Diagnosis not present

## 2020-08-11 DIAGNOSIS — G3184 Mild cognitive impairment, so stated: Secondary | ICD-10-CM | POA: Diagnosis not present

## 2020-08-11 DIAGNOSIS — Z9989 Dependence on other enabling machines and devices: Secondary | ICD-10-CM | POA: Diagnosis not present

## 2020-08-11 DIAGNOSIS — Z8673 Personal history of transient ischemic attack (TIA), and cerebral infarction without residual deficits: Secondary | ICD-10-CM | POA: Diagnosis not present

## 2020-08-15 DIAGNOSIS — U071 COVID-19: Secondary | ICD-10-CM | POA: Diagnosis not present

## 2020-08-17 DIAGNOSIS — D3130 Benign neoplasm of unspecified choroid: Secondary | ICD-10-CM | POA: Diagnosis not present

## 2020-08-18 NOTE — Telephone Encounter (Signed)
Results review by the patient in mychart. Pt has an appt on 08/21/20.

## 2020-08-19 NOTE — H&P (View-Only) (Signed)
Cardiology Office Note:    Date:  08/21/2020   ID:  Lauren [name], DOB Oct 14, 1945, MRN [id number]  PCP:  Lauren Greenspan, MD  [name] Cardiologist:  Dr. [name] Electrophysiologist:  None   Referring MD: Lauren Greenspan, MD   Chief Complaint: follow-up Afib  History of Present Illness:    Lauren [name] is a 75 y.o. female with a hx of breast cancer s/p bilateral mastectomy, HLD with intolerance to statins, borderline diabetes, osteopenia, reactive depression, Sjrogren's syndrome, urinary incontinence, stroke, and vitamin D deficiency.   She was hospitalized 06/13/20- 06/15/20 with slurred speech, falcial droop, aphasia. MRI confirmed stroke. EKG showed Afib and she was started on Eliquis. It was felt stroke was embolic. Echo showed normal LV function without significant valvular abnormalities, left atrium was mildly dilated.   She was last seen 06/25/20 reporting palpitations. She was in afib and metoprolol was increased. She was tolerating medication with minimal symptoms.   Today, the patient reports she is doing well. She stopped Crestor due to severe joint pain. She started red yeast rice, she has taken this for 4 days. She is in rate controlled afib today. No chest pain, shortness of breath, pnd, orthopnea. She reports very mild lower leg edema when she eats a lot of salt. No palpations or lightheadedness. She has been taking Eliqius daily and has not missed any doses.    Past Medical History:  Diagnosis Date   Breast cancer ([place]) 2002   DCIS; BRCA 1 pos HER2 pos, ER2 pos   DDD (degenerative disc disease)    DNR (do not resuscitate) 06/13/2020   GERD (gastroesophageal reflux disease)    HLD (hyperlipidemia)    statin intolerant   Hyperglycemia 9/09   A1C elvated to 6.2   Osteoarthritis    Osteopenia 2007   ? from xray with nl dexa   Reactive depression (situational)    very well controlled, and now takes prozac for sleep   Sjogren's syndrome ([place])    Stroke  ([place])    Urine incontinence    Vitamin D deficiency     Past Surgical History:  Procedure Laterality Date   BILATERAL OOPHORECTOMY  205   CATARACT EXTRACTION, BILATERAL     10/2019, 12/2019   COLONOSCOPY  5/07   normal; recheck 3 years   LUMBAR DISC SURGERY  1991   Ruptured disk L5   MASTECTOMY  2001   bilateral   TONSILLECTOMY  1974   TOTAL HIP ARTHROPLASTY  2011   TOTAL KNEE ARTHROPLASTY  2006   bilateral    Current Medications: Current Meds  Medication Sig   apixaban (ELIQUIS) 5 MG TABS tablet Take 1 tablet (5 mg total) by mouth 2 (two) times daily.   cetirizine (ZYRTEC) 10 MG tablet Take 10 mg by mouth at bedtime.   EPINEPHrine 0.3 mg/0.3 mL IJ SOAJ injection Inject 0.3 mLs (0.3 mg total) into the muscle once.   ezetimibe (ZETIA) 10 MG tablet Take 1 tablet (10 mg total) by mouth daily.   Fish Oil-Cholecalciferol (OMEGA-3 + VITAMIN D3 PO) Take 2 capsules by mouth daily.    FLUoxetine (PROZAC) 20 MG capsule Take 1 capsule (20 mg total) by mouth daily.   gabapentin (NEURONTIN) 300 MG capsule Take 1 capsule (300 mg total) by mouth 2 (two) times daily.   metoprolol tartrate (LOPRESSOR) 25 MG tablet Take 1 tablet (25 mg total) by mouth 2 (two) times daily.   omeprazole (PRILOSEC) 20 MG capsule Take 20 mg  by mouth daily.   oxybutynin (OXYTROL) 3.9 MG/24HR Place 1 patch onto the skin 2 (two) times a week.   pilocarpine (SALAGEN) 5 MG tablet Take 1 tablet (5 mg total) by mouth 3 (three) times daily.   Red Yeast Rice Extract (RED YEAST RICE PO) Take by mouth daily.     Allergies:   Bee venom and Statins   Social History   Socioeconomic History   Marital status: Widowed    Spouse name: Not on file   Number of children: 3   Years of education: Not on file   Highest education level: Not on file  Occupational History   Occupation: Retired  Tobacco Use   Smoking status: Never   Smokeless tobacco: Never  Vaping Use   Vaping Use: Never used  Substance and Sexual Activity    Alcohol use: Not Currently    Alcohol/week: 0.0 standard drinks    Comment: rare, none in 2 years   Drug use: No   Sexual activity: Not Currently  Other Topics Concern   Not on file  Social History Narrative   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      Regular exercise-yes   Social Determinants of Health   Financial Resource Strain: Low Risk    Difficulty of Paying Living Expenses: Not hard at all  Food Insecurity: No Food Insecurity   Worried About Charity fundraiser in the Last Year: Never true   Arboriculturist in the Last Year: Never true  Transportation Needs: No Transportation Needs   Lack of Transportation (Medical): No   Lack of Transportation (Non-Medical): No  Physical Activity: Inactive   Days of Exercise per Week: 0 days   Minutes of Exercise per Session: 0 min  Stress: No Stress Concern Present   Feeling of Stress : Not at all  Social Connections: Not on file     Family History: The patient's family history includes Arthritis in an other family member; Brain cancer in her brother; Breast cancer in an other family member; Dementia in her mother; Diabetes in an other family member; Hyperlipidemia in an other family member; Hypertension in an other family member; Other in her daughter; Ovarian cancer in an other family member; Prostate cancer in an other family member; Psoriasis in her brother; Stroke in her maternal grandmother.  ROS:   Please see the history of present illness.     All other systems reviewed and are negative.  EKGs/Labs/Other Studies Reviewed:    The following studies were reviewed today:  Echo 06/14/20  1. Left ventricular ejection fraction, by estimation, is 55 to 60%. The  left ventricle has normal function. The left ventricle has no regional  wall motion abnormalities. Left ventricular diastolic function could not  be evaluated.   2. Right ventricular systolic function is normal. The right  ventricular  size is normal. There is normal pulmonary artery systolic pressure.   3. Left atrial size was mildly dilated.   4. The mitral valve is normal in structure. No evidence of mitral valve  regurgitation. No evidence of mitral stenosis.   5. The aortic valve is normal in structure. Aortic valve regurgitation is  not visualized. No aortic stenosis is present.   6. The inferior vena cava is normal in size with greater than 50%  respiratory variability, suggesting right atrial pressure of 3 mmHg.    EKG:  EKG is ordered today.  The ekg ordered today demonstrates Afib 81bpm, nonspecific T wave changes  Recent Labs: 06/14/2020: TSH 0.744 06/15/2020: BUN 13; Creatinine, Ser 0.59; Hemoglobin 14.2; Magnesium 2.0; Platelets 209; Potassium 3.9; Sodium 137 08/05/2020: ALT 15  Recent Lipid Panel    Component Value Date/Time   CHOL 177 08/05/2020 0945   TRIG 83 08/05/2020 0945   HDL 46 08/05/2020 0945   CHOLHDL 3.8 08/05/2020 0945   VLDL 17 08/05/2020 0945   LDLCALC 114 (H) 08/05/2020 0945   LDLDIRECT 148.9 08/20/2012 0831    Physical Exam:    VS:  BP 122/80 (BP Location: Left Arm, Patient Position: Sitting, Cuff Size: Large)   Pulse 81   Ht '5\' 8"'  (1.727 m)   Wt 256 lb (116.1 kg)   SpO2 98%   BMI 38.92 kg/m     Wt Readings from Last 3 Encounters:  08/21/20 256 lb (116.1 kg)  06/25/20 250 lb 4 oz (113.5 kg)  06/19/20 251 lb (113.9 kg)     GEN:  Well nourished, well developed in no acute distress HEENT: Normal NECK: No JVD; No carotid bruits LYMPHATICS: No lymphadenopathy CARDIAC: Irreg Irreg, no murmurs, rubs, gallops RESPIRATORY:  Clear to auscultation without rales, wheezing or rhonchi  ABDOMEN: Soft, non-tender, non-distended MUSCULOSKELETAL:  No edema; No deformity  SKIN: Warm and dry NEUROLOGIC:  Alert and oriented x 3 PSYCHIATRIC:  Normal affect   ASSESSMENT:    1. Persistent atrial fibrillation ([place])   2. Hyperlipidemia, mixed   3. History of stroke   4.  Pre-procedure lab exam    PLAN:    In order of problems listed above:  Persistent afib She is in rate controlled afib today. She is asymptomatic. She has been taking Eliquis 65m BID for at least a month, denies missing doses. CHADSVASC 5 (female,age, stroke, DM2). Given that she is still in afib, we will set her up for DCCV and see her back after procedure.   HLD Patient did not tolerate Crestor due to severe joint pain. She is taking red yeast rice and Zetia. Given stroke goal LDL <70. Recent LDL was 114. I will refer her to lipid [name] for PCSK9i. Lifestyle changes encouraged.   H/o stroke No ASA/plavix with Eliquis. Continue statin.   Disposition: Follow up 2-3 weeks post DCCV with APP/MD  Shared Decision Making/Informed Consent   Shared Decision Making/Informed Consent The risks (stroke, cardiac arrhythmias rarely resulting in the need for a temporary or permanent pacemaker, skin irritation or burns and complications associated with conscious sedation including aspiration, arrhythmia, respiratory failure and death), benefits (restoration of normal sinus rhythm) and alternatives of a direct current cardioversion were explained in detail to Lauren [name] and she agrees to proceed.      Signed, Meeghan Skipper HNinfa Meeker PA-C  08/21/2020 9:22 AM    [facility]

## 2020-08-19 NOTE — Progress Notes (Signed)
Cardiology Office Note:    Date:  08/21/2020   ID:  Lauren [name], DOB 08/31/1945, MRN [id number]  PCP:  Abner Greenspan, MD  [facility] Cardiologist:  Dr. Darl[facility] Electrophysiologist:  None   Referring MD: Abner Greenspan, MD   Chief Complaint: follow-up Afib  History of Present Illness:    Lauren [name] is a 75 y.o. female with a hx of breast cancer s/p bilateral mastectomy, HLD with intolerance to statins, borderline diabetes, osteopenia, reactive depression, Sjrogren's syndrome, urinary incontinence, stroke, and vitamin D deficiency.   She was hospitalized 06/13/20- 06/15/20 with slurred speech, falcial droop, aphasia. MRI confirmed stroke. EKG showed Afib and she was started on Eliquis. It was felt stroke was embolic. Echo showed normal LV function without significant valvular abnormalities, left atrium was mildly dilated.   She was last seen 06/25/20 reporting palpitations. She was in afib and metoprolol was increased. She was tolerating medication with minimal symptoms.   Today, the patient reports she is doing well. She stopped Crestor due to severe joint pain. She started red yeast rice, she has taken this for 4 days. She is in rate controlled afib today. No chest pain, shortness of breath, pnd, orthopnea. She reports very mild lower leg edema when she eats a lot of salt. No palpations or lightheadedness. She has been taking Eliqius daily and has not missed any doses.    Past Medical History:  Diagnosis Date   Breast cancer ([place]) 2002   DCIS; BRCA 1 pos HER2 pos, ER2 pos   DDD (degenerative disc disease)    DNR (do not resuscitate) 06/13/2020   GERD (gastroesophageal reflux disease)    HLD (hyperlipidemia)    statin intolerant   Hyperglycemia 9/09   A1C elvated to 6.2   Osteoarthritis    Osteopenia 2007   ? from xray with nl dexa   Reactive depression (situational)    very well controlled, and now takes prozac for sleep   Sjogren's syndrome ([place])    Stroke  ([place])    Urine incontinence    Vitamin D deficiency     Past Surgical History:  Procedure Laterality Date   BILATERAL OOPHORECTOMY  205   CATARACT EXTRACTION, BILATERAL     10/2019, 12/2019   COLONOSCOPY  5/07   normal; recheck 3 years   LUMBAR DISC SURGERY  1991   Ruptured disk L5   MASTECTOMY  2001   bilateral   TONSILLECTOMY  1974   TOTAL HIP ARTHROPLASTY  2011   TOTAL KNEE ARTHROPLASTY  2006   bilateral    Current Medications: Current Meds  Medication Sig   apixaban (ELIQUIS) 5 MG TABS tablet Take 1 tablet (5 mg total) by mouth 2 (two) times daily.   cetirizine (ZYRTEC) 10 MG tablet Take 10 mg by mouth at bedtime.   EPINEPHrine 0.3 mg/0.3 mL IJ SOAJ injection Inject 0.3 mLs (0.3 mg total) into the muscle once.   ezetimibe (ZETIA) 10 MG tablet Take 1 tablet (10 mg total) by mouth daily.   Fish Oil-Cholecalciferol (OMEGA-3 + VITAMIN D3 PO) Take 2 capsules by mouth daily.    FLUoxetine (PROZAC) 20 MG capsule Take 1 capsule (20 mg total) by mouth daily.   gabapentin (NEURONTIN) 300 MG capsule Take 1 capsule (300 mg total) by mouth 2 (two) times daily.   metoprolol tartrate (LOPRESSOR) 25 MG tablet Take 1 tablet (25 mg total) by mouth 2 (two) times daily.   omeprazole (PRILOSEC) 20 MG capsule Take 20 mg  by mouth daily.   oxybutynin (OXYTROL) 3.9 MG/24HR Place 1 patch onto the skin 2 (two) times a week.   pilocarpine (SALAGEN) 5 MG tablet Take 1 tablet (5 mg total) by mouth 3 (three) times daily.   Red Yeast Rice Extract (RED YEAST RICE PO) Take by mouth daily.     Allergies:   Bee venom and Statins   Social History   Socioeconomic History   Marital status: Widowed    Spouse name: Not on file   Number of children: 3   Years of education: Not on file   Highest education level: Not on file  Occupational History   Occupation: Retired  Tobacco Use   Smoking status: Never   Smokeless tobacco: Never  Vaping Use   Vaping Use: Never used  Substance and Sexual Activity    Alcohol use: Not Currently    Alcohol/week: 0.0 standard drinks    Comment: rare, none in 2 years   Drug use: No   Sexual activity: Not Currently  Other Topics Concern   Not on file  Social History Narrative   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      Regular exercise-yes   Social Determinants of Health   Financial Resource Strain: Low Risk    Difficulty of Paying Living Expenses: Not hard at all  Food Insecurity: No Food Insecurity   Worried About Charity fundraiser in the Last Year: Never true   Arboriculturist in the Last Year: Never true  Transportation Needs: No Transportation Needs   Lack of Transportation (Medical): No   Lack of Transportation (Non-Medical): No  Physical Activity: Inactive   Days of Exercise per Week: 0 days   Minutes of Exercise per Session: 0 min  Stress: No Stress Concern Present   Feeling of Stress : Not at all  Social Connections: Not on file     Family History: The patient's family history includes Arthritis in an other family member; Brain cancer in her brother; Breast cancer in an other family member; Dementia in her mother; Diabetes in an other family member; Hyperlipidemia in an other family member; Hypertension in an other family member; Other in her daughter; Ovarian cancer in an other family member; Prostate cancer in an other family member; Psoriasis in her brother; Stroke in her maternal grandmother.  ROS:   Please see the history of present illness.     All other systems reviewed and are negative.  EKGs/Labs/Other Studies Reviewed:    The following studies were reviewed today:  Echo 06/14/20  1. Left ventricular ejection fraction, by estimation, is 55 to 60%. The  left ventricle has normal function. The left ventricle has no regional  wall motion abnormalities. Left ventricular diastolic function could not  be evaluated.   2. Right ventricular systolic function is normal. The right  ventricular  size is normal. There is normal pulmonary artery systolic pressure.   3. Left atrial size was mildly dilated.   4. The mitral valve is normal in structure. No evidence of mitral valve  regurgitation. No evidence of mitral stenosis.   5. The aortic valve is normal in structure. Aortic valve regurgitation is  not visualized. No aortic stenosis is present.   6. The inferior vena cava is normal in size with greater than 50%  respiratory variability, suggesting right atrial pressure of 3 mmHg.    EKG:  EKG is ordered today.  The ekg ordered today demonstrates Afib 81bpm, nonspecific T wave changes  Recent Labs: 06/14/2020: TSH 0.744 06/15/2020: BUN 13; Creatinine, Ser 0.59; Hemoglobin 14.2; Magnesium 2.0; Platelets 209; Potassium 3.9; Sodium 137 08/05/2020: ALT 15  Recent Lipid Panel    Component Value Date/Time   CHOL 177 08/05/2020 0945   TRIG 83 08/05/2020 0945   HDL 46 08/05/2020 0945   CHOLHDL 3.8 08/05/2020 0945   VLDL 17 08/05/2020 0945   LDLCALC 114 (H) 08/05/2020 0945   LDLDIRECT 148.9 08/20/2012 0831    Physical Exam:    VS:  BP 122/80 (BP Location: Left Arm, Patient Position: Sitting, Cuff Size: Large)   Pulse 81   Ht '5\' 8"'  (1.727 m)   Wt 256 lb (116.1 kg)   SpO2 98%   BMI 38.92 kg/m     Wt Readings from Last 3 Encounters:  08/21/20 256 lb (116.1 kg)  06/25/20 250 lb 4 oz (113.5 kg)  06/19/20 251 lb (113.9 kg)     GEN:  Well nourished, well developed in no acute distress HEENT: Normal NECK: No JVD; No carotid bruits LYMPHATICS: No lymphadenopathy CARDIAC: Irreg Irreg, no murmurs, rubs, gallops RESPIRATORY:  Clear to auscultation without rales, wheezing or rhonchi  ABDOMEN: Soft, non-tender, non-distended MUSCULOSKELETAL:  No edema; No deformity  SKIN: Warm and dry NEUROLOGIC:  Alert and oriented x 3 PSYCHIATRIC:  Normal affect   ASSESSMENT:    1. Persistent atrial fibrillation ([place])   2. Hyperlipidemia, mixed   3. History of stroke   4.  Pre-procedure lab exam    PLAN:    In order of problems listed above:  Persistent afib She is in rate controlled afib today. She is asymptomatic. She has been taking Eliquis 2m BID for at least a month, denies missing doses. CHADSVASC 5 (female,age, stroke, DM2). Given that she is still in afib, we will set her up for DCCV and see her back after procedure.   HLD Patient did not tolerate Crestor due to severe joint pain. She is taking red yeast rice and Zetia. Given stroke goal LDL <70. Recent LDL was 114. I will refer her to lipid clinic for PCSK9i. Lifestyle changes encouraged.   H/o stroke No ASA/plavix with Eliquis. Continue statin.   Disposition: Follow up 2-3 weeks post DCCV with APP/MD  Shared Decision Making/Informed Consent   Shared Decision Making/Informed Consent The risks (stroke, cardiac arrhythmias rarely resulting in the need for a temporary or permanent pacemaker, skin irritation or burns and complications associated with conscious sedation including aspiration, arrhythmia, respiratory failure and death), benefits (restoration of normal sinus rhythm) and alternatives of a direct current cardioversion were explained in detail to Ms. Trammel and she agrees to proceed.      Signed, Linas Stepter HNinfa Meeker PA-C  08/21/2020 9:22 AM    [facility]

## 2020-08-21 ENCOUNTER — Other Ambulatory Visit: Payer: Self-pay

## 2020-08-21 ENCOUNTER — Ambulatory Visit (INDEPENDENT_AMBULATORY_CARE_PROVIDER_SITE_OTHER): Payer: Medicare Other | Admitting: Medical

## 2020-08-21 ENCOUNTER — Encounter: Payer: Self-pay | Admitting: Medical

## 2020-08-21 VITALS — BP 122/80 | HR 81 | Ht 68.0 in | Wt 256.0 lb

## 2020-08-21 DIAGNOSIS — Z01818 Encounter for other preprocedural examination: Secondary | ICD-10-CM

## 2020-08-21 DIAGNOSIS — E782 Mixed hyperlipidemia: Secondary | ICD-10-CM | POA: Diagnosis not present

## 2020-08-21 DIAGNOSIS — Z01812 Encounter for preprocedural laboratory examination: Secondary | ICD-10-CM | POA: Diagnosis not present

## 2020-08-21 DIAGNOSIS — Z8673 Personal history of transient ischemic attack (TIA), and cerebral infarction without residual deficits: Secondary | ICD-10-CM

## 2020-08-21 DIAGNOSIS — I4819 Other persistent atrial fibrillation: Secondary | ICD-10-CM

## 2020-08-21 NOTE — Patient Instructions (Addendum)
Medication Instructions:  - Your physician recommends that you continue on your current medications as directed. Please refer to the Current Medication list given to you today.  *If you need a refill on your cardiac medications before your next appointment, please call your pharmacy*   Lab Work: - Your physician recommends that you have lab work today: BMP/ CBC  If you have labs (blood work) drawn today and your tests are completely normal, you will receive your results only by: MyChart Message (if you have MyChart) OR A paper copy in the mail If you have any lab test that is abnormal or we need to change your treatment, we will call you to review the results.   Testing/Procedures: - Your physician has recommended that you have a Cardioversion (DCCV). Electrical Cardioversion uses a jolt of electricity to your heart either through paddles or wired patches attached to your chest. This is a controlled, usually prescheduled, procedure. Defibrillation is done under light anesthesia in the hospital, and you usually go home the day of the procedure. This is done to get your heart back into a normal rhythm. You are not awake for the procedure.   You are scheduled for a Cardioversion on Monday 08/24/20 with Dr. Fletcher Anon.  Please arrive at the [facility] at 6:30 a.m. on the day of your procedure. You will go to the 1st desk on the right, Registration, to check in.   DIET INSTRUCTIONS:  Nothing to eat or drink after midnight the night prior to your procedure         Labs: today  Medications:  You may take all of your regular medications the morning of your procedure with enough water to get them down safely.  Must have a responsible person to drive you home.  Bring a current list of your medications and current insurance cards.    If you have any questions after you get home, please call the office at [phone]    Follow-Up: At [facility], you and your health needs are our  priority.  As part of our continuing mission to provide you with exceptional heart care, we have created designated Provider Care Teams.  These Care Teams include your primary Cardiologist (physician) and Advanced Practice Providers (APPs -  Physician Assistants and Nurse Practitioners) who all work together to provide you with the care you need, when you need it.  We recommend signing up for the patient portal called "MyChart".  Sign up information is provided on this After Visit Summary.  MyChart is used to connect with patients for Virtual Visits (Telemedicine).  Patients are able to view lab/test results, encounter notes, upcoming appointments, etc.  Non-urgent messages can be sent to your provider as well.   To learn more about what you can do with MyChart, go to [email or web address].    Your next appointment:   2 week(s)  The format for your next appointment:   In Person  Provider:   You may see Kathlyn Sacramento, MD or one of the following Advanced Practice Providers on your designated Care Team:   Murray Hodgkins, NP Christell Faith, PA-C Marrianne Mood, PA-C Cadence Kathlen Mody, [place]   Other Instructions  1) You have been referred to: the [facility] in [place]- they will call you directly to schedule an appointment from their clinic      Electrical Cardioversion Electrical cardioversion is the delivery of a jolt of electricity to restore a normal rhythm to the heart. A rhythm that is  too fast or is not regular keeps the heart from pumping well. In this procedure, sticky patches or metal paddlesare placed on the chest to deliver electricity to the heart from a device. This procedure may be done in an emergency if: There is low or no blood pressure as a result of the heart rhythm. Normal rhythm must be restored as fast as possible to protect the brain and heart from further damage. It may save a life. This may also be a scheduled procedure for irregular or fast heart rhythms  thatare not immediately life-threatening. Tell a health care provider about: Any allergies you have. All medicines you are taking, including vitamins, herbs, eye drops, creams, and over-the-counter medicines. Any problems you or family members have had with anesthetic medicines. Any blood disorders you have. Any surgeries you have had. Any medical conditions you have. Whether you are pregnant or may be pregnant. What are the risks? Generally, this is a safe procedure. However, problems may occur, including: Allergic reactions to medicines. A blood clot that breaks free and travels to other parts of your body. The possible return of an abnormal heart rhythm within hours or days after the procedure. Your heart stopping (cardiac arrest). This is rare. What happens before the procedure? Medicines Your health care provider may have you start taking: Blood-thinning medicines (anticoagulants) so your blood does not clot as easily. Medicines to help stabilize your heart rate and rhythm. Ask your health care provider about: Changing or stopping your regular medicines. This is especially important if you are taking diabetes medicines or blood thinners. Taking medicines such as aspirin and ibuprofen. These medicines can thin your blood. Do not take these medicines unless your health care provider tells you to take them. Taking over-the-counter medicines, vitamins, herbs, and supplements. General instructions Follow instructions from your health care provider about eating or drinking restrictions. Plan to have someone take you home from the hospital or clinic. If you will be going home right after the procedure, plan to have someone with you for 24 hours. Ask your health care provider what steps will be taken to help prevent infection. These may include washing your skin with a germ-killing soap. What happens during the procedure?  An IV will be inserted into one of your veins. Sticky patches  (electrodes) or metal paddles may be placed on your chest. You will be given a medicine to help you relax (sedative). An electrical shock will be delivered. The procedure may vary among health care providers and hospitals. What can I expect after the procedure? Your blood pressure, heart rate, breathing rate, and blood oxygen level will be monitored until you leave the hospital or clinic. Your heart rhythm will be watched to make sure it does not change. You may have some redness on the skin where the shocks were given. Follow these instructions at home: Do not drive for 24 hours if you were given a sedative during your procedure. Take over-the-counter and prescription medicines only as told by your health care provider. Ask your health care provider how to check your pulse. Check it often. Rest for 48 hours after the procedure or as told by your health care provider. Avoid or limit your caffeine use as told by your health care provider. Keep all follow-up visits as told by your health care provider. This is important. Contact a health care provider if: You feel like your heart is beating too quickly or your pulse is not regular. You have a serious muscle cramp  that does not go away. Get help right away if: You have discomfort in your chest. You are dizzy or you feel faint. You have trouble breathing or you are short of breath. Your speech is slurred. You have trouble moving an arm or leg on one side of your body. Your fingers or toes turn cold or blue. Summary Electrical cardioversion is the delivery of a jolt of electricity to restore a normal rhythm to the heart. This procedure may be done right away in an emergency or may be a scheduled procedure if the condition is not an emergency. Generally, this is a safe procedure. After the procedure, check your pulse often as told by your health care provider. This information is not intended to replace advice given to you by your health care  provider. Make sure you discuss any questions you have with your healthcare provider. Document Revised: 08/27/2018 Document Reviewed: 08/27/2018 Elsevier Patient Education  [place].

## 2020-08-22 LAB — CBC
Hematocrit: 45 % (ref 34.0–46.6)
Hemoglobin: 13.7 g/dL (ref 11.1–15.9)
MCH: 26.5 pg — ABNORMAL LOW (ref 26.6–33.0)
MCHC: 30.4 g/dL — ABNORMAL LOW (ref 31.5–35.7)
MCV: 87 fL (ref 79–97)
Platelets: 219 10*3/uL (ref 150–450)
RBC: 5.17 x10E6/uL (ref 3.77–5.28)
RDW: 14.2 % (ref 11.7–15.4)
WBC: 7.5 10*3/uL (ref 3.4–10.8)

## 2020-08-22 LAB — BASIC METABOLIC PANEL
BUN/Creatinine Ratio: 20 (ref 12–28)
BUN: 13 mg/dL (ref 8–27)
CO2: 20 mmol/L (ref 20–29)
Calcium: 8.9 mg/dL (ref 8.7–10.3)
Chloride: 103 mmol/L (ref 96–106)
Creatinine, Ser: 0.65 mg/dL (ref 0.57–1.00)
Glucose: 118 mg/dL — ABNORMAL HIGH (ref 65–99)
Potassium: 4.2 mmol/L (ref 3.5–5.2)
Sodium: 140 mmol/L (ref 134–144)
eGFR: 92 mL/min/{1.73_m2} (ref >59)

## 2020-08-24 ENCOUNTER — Ambulatory Visit
Admission: RE | Admit: 2020-08-24 | Discharge: 2020-08-24 | Disposition: A | Discharge status: Home / Self Care | Payer: Medicare Other | Attending: Cardiovascular Disease | Admitting: Cardiovascular Disease

## 2020-08-24 ENCOUNTER — Encounter
Admission: RE | Disposition: A | Discharge status: Home / Self Care | Payer: Self-pay | Source: Home / Self Care | Attending: Cardiovascular Disease

## 2020-08-24 ENCOUNTER — Ambulatory Visit: Payer: Medicare Other | Admitting: Anesthesiology

## 2020-08-24 ENCOUNTER — Encounter: Payer: Self-pay | Admitting: Cardiovascular Disease

## 2020-08-24 DIAGNOSIS — E782 Mixed hyperlipidemia: Secondary | ICD-10-CM | POA: Diagnosis not present

## 2020-08-24 DIAGNOSIS — Z7901 Long term (current) use of anticoagulants: Secondary | ICD-10-CM | POA: Insufficient documentation

## 2020-08-24 DIAGNOSIS — Z888 Allergy status to other drugs, medicaments and biological substances status: Secondary | ICD-10-CM | POA: Insufficient documentation

## 2020-08-24 DIAGNOSIS — I4819 Other persistent atrial fibrillation: Secondary | ICD-10-CM

## 2020-08-24 DIAGNOSIS — I4891 Unspecified atrial fibrillation: Secondary | ICD-10-CM | POA: Diagnosis not present

## 2020-08-24 DIAGNOSIS — Z91030 Bee allergy status: Secondary | ICD-10-CM | POA: Diagnosis not present

## 2020-08-24 DIAGNOSIS — K219 Gastro-esophageal reflux disease without esophagitis: Secondary | ICD-10-CM | POA: Diagnosis not present

## 2020-08-24 DIAGNOSIS — Z8249 Family history of ischemic heart disease and other diseases of the circulatory system: Secondary | ICD-10-CM | POA: Insufficient documentation

## 2020-08-24 DIAGNOSIS — Z79899 Other long term (current) drug therapy: Secondary | ICD-10-CM | POA: Diagnosis not present

## 2020-08-24 DIAGNOSIS — Z8673 Personal history of transient ischemic attack (TIA), and cerebral infarction without residual deficits: Secondary | ICD-10-CM | POA: Insufficient documentation

## 2020-08-24 HISTORY — PX: CARDIOVERSION: SHX1299

## 2020-08-24 SURGERY — CARDIOVERSION
Anesthesia: General

## 2020-08-24 MED ORDER — PROPOFOL 10 MG/ML IV BOLUS
INTRAVENOUS | Status: DC | PRN
  Administered 2020-08-24: 20 mg via INTRAVENOUS
  Administered 2020-08-24: 60 mg via INTRAVENOUS

## 2020-08-24 MED ORDER — LIDOCAINE HCL (PF) 2 % IJ SOLN
INTRAMUSCULAR | Status: AC
  Filled 2020-08-24: qty 5

## 2020-08-24 MED ORDER — SODIUM CHLORIDE 0.9 % IV SOLN: INTRAVENOUS | Status: DC | PRN

## 2020-08-24 MED ORDER — PROPOFOL 500 MG/50ML IV EMUL
INTRAVENOUS | Status: AC
  Filled 2020-08-24: qty 50

## 2020-08-24 NOTE — Transfer of Care (Signed)
Immediate Anesthesia Transfer of Care Note  Patient: Lauren [name]  Procedure(s) Performed: CARDIOVERSION  Patient Location: PACU and Nursing Unit  Anesthesia Type:General  Level of Consciousness: drowsy and patient cooperative  Airway & Oxygen Therapy: Patient Spontanous Breathing and Patient connected to nasal cannula oxygen  Post-op Assessment: Report given to RN and Post -op Vital signs reviewed and stable  Post vital signs: Reviewed and stable  Last Vitals:  Vitals Value Taken Time  BP 104/66 08/24/20 0749  Temp    Pulse 56 08/24/20 0749  Resp 32 08/24/20 0749  SpO2 96 % 08/24/20 0749    Last Pain:  Vitals:   08/24/20 0647  TempSrc: Oral  PainSc: 0-No pain         Complications: No notable events documented.

## 2020-08-24 NOTE — Anesthesia Preprocedure Evaluation (Signed)
Anesthesia Evaluation  Patient identified by MRN, date of birth, ID band Patient awake    Reviewed: Allergy & Precautions, NPO status , Patient's Chart, lab work & pertinent test results  Airway Mallampati: II  TM Distance: >3 FB Neck ROM: Full    Dental  (+) Poor Dentition   Pulmonary sleep apnea and Continuous Positive Airway Pressure Ventilation ,    Pulmonary exam normal        Cardiovascular + dysrhythmias Atrial Fibrillation  Rhythm:Irregular     Neuro/Psych PSYCHIATRIC DISORDERS Depression  Neuromuscular disease CVA, No Residual Symptoms    GI/Hepatic Neg liver ROS, GERD  Medicated and Controlled,  Endo/Other  Morbid obesity  Renal/GU negative Renal ROS  negative genitourinary   Musculoskeletal  (+) Arthritis ,   Abdominal   Peds negative pediatric ROS (+)  Hematology negative hematology ROS (+)   Anesthesia Other Findings Breast cancer ([place]) 2002 DCIS; BRCA 1 pos HER2 pos, ER2 pos  DDD (degenerative disc disease)  DNR (do not resuscitate) 06/13/2020 GERD (gastroesophageal reflux disease) HLD (hyperlipidemia)  statin intolerant  Hyperglycemia 9/09 A1C elvated to 6.2  Osteoarthritis    Osteopenia 2007 ? from xray with nl dexa  Reactive depression (situational)  very well controlled, and now takes prozac for sleep  Sjogren's syndrome ([place])   Stroke ([place])    Urine incontinence    Vitamin D deficiency       Reproductive/Obstetrics negative OB ROS                            Anesthesia Physical Anesthesia Plan  ASA: 3  Anesthesia Plan: General   Post-op Pain Management:    Induction: Intravenous  PONV Risk Score and Plan: 2 and Propofol infusion and TIVA  Airway Management Planned: Natural Airway, Nasal Cannula and Mask  Additional Equipment:   Intra-op Plan:   Post-operative Plan:   Informed Consent: I have reviewed the patients History and Physical, chart, labs and  discussed the procedure including the risks, benefits and alternatives for the proposed anesthesia with the patient or authorized representative who has indicated his/her understanding and acceptance.    Discussed DNR with power of attorney and Suspend DNR.     Plan Discussed with: CRNA, Anesthesiologist and Surgeon  Anesthesia Plan Comments:         Anesthesia Quick Evaluation

## 2020-08-24 NOTE — CV Procedure (Signed)
Cardioversion procedure note For atrial fibrillation, persistent.  Procedure Details:  Consent: Risks of procedure as well as the alternatives and risks of each were explained to the (patient/caregiver).  Consent for procedure obtained.  Time Out: Verified patient identification, verified procedure, site/side was marked, verified correct patient position, special equipment/implants available, medications/allergies/relevent history reviewed, required imaging and test results available.  Performed  Patient placed on cardiac monitor, pulse oximetry, supplemental oxygen as necessary.   Sedation given: propofol IV, Dr. Nira Retort Pacer pads placed anterior and posterior chest.   Cardioverted 2 time(s).   Cardioverted at  150J, 200J. Synchronized biphasic Converted to NSR   Evaluation: Findings: Post procedure EKG shows: NSR Complications: None Patient did tolerate procedure well.  Time Spent Directly with the Patient:  85 minutes   Esmond Plants, M.D., Ph.D.

## 2020-08-24 NOTE — Anesthesia Postprocedure Evaluation (Signed)
Anesthesia Post Note  Patient: Lauren [name]  Procedure(s) Performed: CARDIOVERSION  Anesthesia Type: General Anesthetic complications: no   No notable events documented.   Last Vitals:  Vitals:   08/24/20 0815 08/24/20 0830  BP: 107/67 108/77  Pulse: (!) 58 (!) 48  Resp: 18 16  Temp:    SpO2: 99% 100%    Last Pain:  Vitals:   08/24/20 0830  TempSrc:   PainSc: Asleep                 Phill Mutter

## 2020-08-25 ENCOUNTER — Other Ambulatory Visit: Payer: Self-pay

## 2020-08-25 ENCOUNTER — Ambulatory Visit (INDEPENDENT_AMBULATORY_CARE_PROVIDER_SITE_OTHER): Payer: Medicare Other | Admitting: Pharmacist

## 2020-08-25 VITALS — BP 116/80 | HR 54 | Resp 15 | Ht 68.0 in | Wt 258.3 lb

## 2020-08-25 DIAGNOSIS — I639 Cerebral infarction, unspecified: Secondary | ICD-10-CM

## 2020-08-25 DIAGNOSIS — T466X5A Adverse effect of antihyperlipidemic and antiarteriosclerotic drugs, initial encounter: Secondary | ICD-10-CM | POA: Diagnosis not present

## 2020-08-25 DIAGNOSIS — E7800 Pure hypercholesterolemia, unspecified: Secondary | ICD-10-CM

## 2020-08-25 DIAGNOSIS — M791 Myalgia, unspecified site: Secondary | ICD-10-CM

## 2020-08-25 NOTE — Patient Instructions (Signed)
It was nice meeting you today!  We would like your LDL (bad cholesterol) to be less than 70.    Continue Zetia 10mg  at this time as well as healthy eating and pool exercises  We will start a new medication you will inject once every 2 weeks.  You will get a call when it is approved  We will then recheck your cholesterol in about 2-3 months  Please let me know if you have any questions!  Karren Cobble, PharmD, BCACP, [place], [place] [address] [place], [place] [address] Phone: [phone]; Fax: [phone] 08/25/2020 2:16 PM

## 2020-08-25 NOTE — Progress Notes (Signed)
Patient ID: Lauren [name]                 DOB: Jun 05, 1945                    MRN: [id number]     HPI: Lauren [name] is a 75 y.o. female patient referred to lipid clinic by Cadence Furth. PMH is significant for breast cancer s/p bilateral mastectomy, HLD with intolerance to statins, borderline diabetes, osteopenia, reactive depression, Sjrogren's syndrome, urinary incontinence, stroke, and vitamin D deficiency.   She was hospitalized 06/13/20- 06/15/20 with slurred speech, falcial droop, aphasia. MRI confirmed stroke. EKG showed Afib and she was started on Eliquis. It was felt stroke was embolic. Echo showed normal LV function without significant valvular abnormalities, left atrium was mildly dilated.  Patient presents today in good spirits.  Intolerant to statins.  Could only tolerate Crestor for 4 days before muscle pain became unbearable.  Has begun taking Red Yeast Rice supplementation.    Had DCCV yesterday and reports she feels much better.  Current Medications: Red Yeast Rice, Zetia Intolerances: Crestor, Atorvastatin Risk Factors: HLD, hx of CVA LDL goal: <70  Social History: no alcohol, tobacco  Labs:  LDL 114, HDL 46, Trigs 83, TC 177 (08/05/20)  Past Medical History:  Diagnosis Date   Breast cancer ([place]) 2002   DCIS; BRCA 1 pos HER2 pos, ER2 pos   DDD (degenerative disc disease)    DNR (do not resuscitate) 06/13/2020   GERD (gastroesophageal reflux disease)    HLD (hyperlipidemia)    statin intolerant   Hyperglycemia 9/09   A1C elvated to 6.2   Osteoarthritis    Osteopenia 2007   ? from xray with nl dexa   Reactive depression (situational)    very well controlled, and now takes prozac for sleep   Sjogren's syndrome ([place])    Stroke ([place])    Urine incontinence    Vitamin D deficiency     Current Outpatient Medications on File Prior to Visit  Medication Sig Dispense Refill   acetaminophen (TYLENOL) 500 MG tablet Take 1,000 mg by mouth every 6 (six) hours as needed for  moderate pain.     apixaban (ELIQUIS) 5 MG TABS tablet Take 1 tablet (5 mg total) by mouth 2 (two) times daily. 180 tablet 1   Carboxymethylcellul-Glycerin (LUBRICATING EYE DROPS OP) Place 1 drop into both eyes 5 (five) times daily.     cetirizine (ZYRTEC) 10 MG tablet Take 10 mg by mouth at bedtime.     EPINEPHrine 0.3 mg/0.3 mL IJ SOAJ injection Inject 0.3 mLs (0.3 mg total) into the muscle once. (Patient taking differently: Inject 0.3 mg into the muscle as needed for anaphylaxis.) 1 Device 5   ezetimibe (ZETIA) 10 MG tablet Take 1 tablet (10 mg total) by mouth daily. 90 tablet 3   Fish Oil-Cholecalciferol (OMEGA-3 + VITAMIN D3 PO) Take 2 capsules by mouth daily.      FLUoxetine (PROZAC) 20 MG capsule Take 1 capsule (20 mg total) by mouth daily. 90 capsule 3   gabapentin (NEURONTIN) 300 MG capsule Take 1 capsule (300 mg total) by mouth 2 (two) times daily. 180 capsule 3   metoprolol tartrate (LOPRESSOR) 25 MG tablet Take 1 tablet (25 mg total) by mouth 2 (two) times daily. 180 tablet 1   omeprazole (PRILOSEC) 20 MG capsule Take 20 mg by mouth daily.     oxybutynin (OXYTROL) 3.9 MG/24HR Place 1 patch onto the skin 2 (two) times a  week. 24 patch 3   pilocarpine (SALAGEN) 5 MG tablet Take 1 tablet (5 mg total) by mouth 3 (three) times daily. 270 tablet 3   Red Yeast Rice Extract (RED YEAST RICE PO) Take 1 tablet by mouth daily.     No current facility-administered medications on file prior to visit.    Allergies  Allergen Reactions   Statins     joint pain   Wasp Venom Swelling    Assessment/Plan:  1. Hyperlipidemia - Patient LDL 114 which is above goal of <70. Since intolerant to statins, will need PCSK9i therapy.  Using demo pen, educated patient on mechanism of action, storage, site selection, and administration.  Patient was able to demonstrate use in room. Will complete PA and contact patient when complete.  Repeat lipid panel set for 2-3 months.  Karren Cobble, PharmD, BCACP, [place],  [place] [address] [place], [place] [address] Phone: [phone]; Fax: [phone] 08/25/2020 3:55 PM

## 2020-08-26 ENCOUNTER — Other Ambulatory Visit (HOSPITAL_COMMUNITY): Payer: Self-pay

## 2020-08-27 ENCOUNTER — Telehealth: Payer: Self-pay

## 2020-08-27 DIAGNOSIS — E7800 Pure hypercholesterolemia, unspecified: Secondary | ICD-10-CM

## 2020-08-27 DIAGNOSIS — M3509 Sicca syndrome with other organ involvement: Secondary | ICD-10-CM | POA: Diagnosis not present

## 2020-08-27 DIAGNOSIS — M791 Myalgia, unspecified site: Secondary | ICD-10-CM | POA: Diagnosis not present

## 2020-08-27 DIAGNOSIS — M159 Polyosteoarthritis, unspecified: Secondary | ICD-10-CM | POA: Diagnosis not present

## 2020-08-27 MED ORDER — REPATHA SURECLICK 140 MG/ML ~~LOC~~ SOAJ: 140.0000 mg | SUBCUTANEOUS | 11 refills | Status: DC

## 2020-08-27 NOTE — Telephone Encounter (Signed)
Called and spoke w/pt regarding the repatha approval and rx sent. Pt instructed to complete fasting labs post fourth dose

## 2020-08-30 NOTE — Interval H&P Note (Signed)
History and Physical Interval Note:  08/30/2020 10:44 AM  [name]  has presented today for surgery, with the diagnosis of Cardioversion  Afib.  The various methods of treatment have been discussed with the patient and family. After consideration of risks, benefits and other options for treatment, the patient has consented to  Procedure(s): CARDIOVERSION (N/A) as a surgical intervention.  The patient's history has been reviewed, patient examined, no change in status, stable for surgery.  I have reviewed the patient's chart and labs.  Questions were answered to the patient's satisfaction.     Lauren [name]

## 2020-08-30 NOTE — H&P (Signed)
H&P Addendum, pre-cardioversion  Patient was seen and evaluated prior to -cardioversion procedure Symptoms, prior testing details again confirmed with the patient Patient examined, no significant change from prior exam Lab work reviewed in detail personally by myself Patient understands risk and benefit of the procedure, willing to proceed  Signed, Tim Shannette Tabares, MD, Ph.D [facility]  

## 2020-09-10 ENCOUNTER — Ambulatory Visit: Payer: Medicare Other | Admitting: Nurse Practitioner

## 2020-09-21 ENCOUNTER — Encounter: Payer: Self-pay | Admitting: Nurse Practitioner

## 2020-09-21 ENCOUNTER — Other Ambulatory Visit: Payer: Self-pay

## 2020-09-21 ENCOUNTER — Ambulatory Visit (INDEPENDENT_AMBULATORY_CARE_PROVIDER_SITE_OTHER): Payer: Medicare Other | Admitting: Nurse Practitioner

## 2020-09-21 VITALS — BP 128/88 | HR 76 | Ht 68.0 in | Wt 255.0 lb

## 2020-09-21 DIAGNOSIS — E785 Hyperlipidemia, unspecified: Secondary | ICD-10-CM

## 2020-09-21 DIAGNOSIS — I4819 Other persistent atrial fibrillation: Secondary | ICD-10-CM | POA: Diagnosis not present

## 2020-09-21 DIAGNOSIS — R079 Chest pain, unspecified: Secondary | ICD-10-CM

## 2020-09-21 DIAGNOSIS — G4733 Obstructive sleep apnea (adult) (pediatric): Secondary | ICD-10-CM | POA: Diagnosis not present

## 2020-09-21 DIAGNOSIS — I639 Cerebral infarction, unspecified: Secondary | ICD-10-CM | POA: Diagnosis not present

## 2020-09-21 NOTE — Progress Notes (Signed)
Office Visit    Patient Name: Lauren [name] Date of Encounter: 09/21/2020  Primary Care Provider:  Abner Greenspan, MD Primary Cardiologist:  Kathlyn Sacramento, MD  Chief Complaint    75 year old female with a history of breast cancer status post bilateral mastectomies, stroke in the setting of persistent atrial fibrillation, hyperlipidemia with intolerance to statins, borderline diabetes, osteopenia, reactive depression, Sjogren's syndrome, urinary incontinence, obstructive sleep apnea, and vitamin D deficiency, presents for follow-up after recent cardioversion for atrial fibrillation.  Past Medical History    Past Medical History:  Diagnosis Date   Breast cancer ([place]) 2002   DCIS; BRCA 1 pos HER2 pos, ER2 pos   DDD (degenerative disc disease)    DNR (do not resuscitate) 06/13/2020   GERD (gastroesophageal reflux disease)    History of echocardiogram    a. 06/2020 Echo: EF 55-60%, no rwma, nl RV size/fxn. Mildly dil LA.   HLD (hyperlipidemia)    statin intolerant   Hyperglycemia 10/2007   A1C elvated to 6.2   Obstructive sleep apnea    Osteoarthritis    Osteopenia 2007   ? from xray with nl dexa   Persistent atrial fibrillation ([place])    a. Dx 06/2020 in setting of stroke. CHA2DS2VASc = 5-->Eliquis; b. 08/2020 Successful DCCV; c. 09/2020 Office f/u- pt in rate-controlled Afib.   Reactive depression (situational)    very well controlled, and now takes prozac for sleep   Sjogren's syndrome ([place])    Stroke ([place]) 06/2020   Urine incontinence    Vitamin D deficiency    Past Surgical History:  Procedure Laterality Date   BILATERAL OOPHORECTOMY  205   CARDIOVERSION N/A 08/24/2020   Procedure: CARDIOVERSION;  Surgeon: Wellington Hampshire, MD;  Location: [facility] ORS;  Service: Cardiovascular;  Laterality: N/A;   CATARACT EXTRACTION, BILATERAL     10/2019, 12/2019   COLONOSCOPY  5/07   normal; recheck 3 years   [place]   Ruptured disk L5   MASTECTOMY  2001    bilateral   TONSILLECTOMY  1974   TOTAL HIP ARTHROPLASTY  2011   TOTAL KNEE ARTHROPLASTY  2006   bilateral    Allergies  Allergies  Allergen Reactions   Crestor [Rosuvastatin]     myalgias   Lipitor [Atorvastatin]     myalgias   Statins     joint pain   Wasp Venom Swelling    History of Present Illness    75 year old female with the above past medical history including breast cancer status post bilateral mastectomies, stroke, persistent atrial fibrillation, hyperlipidemia with statin intolerance, borderline diabetes, osteopenia, reactive depression, Sjogren's syndrome, urinary continence, and obstructive sleep apnea on CPAP, vitamin D deficiency.  She was hospitalized in May of this year with acute onset of slurred speech, facial droop, and aphasia.  MRI confirmed stroke.  EKG showed atrial fibrillation.  She was started on Eliquis and beta-blocker therapy with good rate control.  Echocardiogram showed normal LV systolic function without significant valvular abnormalities.  The left atrium was mildly dilated.  On further questioning, she reported palpitations dating back to at least February 2022 and was subsequently told that she had an irregular heart rhythm by pulmonology in March 2022.  Metoprolol was increased to 25 mg twice daily at outpatient follow-up on May 19, and she remained in rate controlled atrial fibrillation at office follow-up on July 15.  She was not experiencing any symptoms related atrial fibrillation.  Decision was made to pursue cardioversion,  which was successfully performed on July 18.  Since her cardioversion, she has felt well.  She periodically checks her heart rate and has not noted any tachycardia or palpitations.  She is back in atrial fibrillation today at 76 bpm.  She is completely asymptomatic.  She notes that she is reasonably active without experiencing chest pain, dyspnea, presyncope, or palpitations.  This morning, she had mild left upper chest discomfort  which was fairly focal and resolved after getting up and eating a banana.  She had never had anything like this before and thinks maybe it was indigestion.  She is not interested in testing at this time.  She denies palpitations, PND, orthopnea, dizziness, syncope, edema, or early satiety.  Home Medications    Current Outpatient Medications  Medication Sig Dispense Refill   acetaminophen (TYLENOL) 500 MG tablet Take 1,000 mg by mouth every 6 (six) hours as needed for moderate pain.     apixaban (ELIQUIS) 5 MG TABS tablet Take 1 tablet (5 mg total) by mouth 2 (two) times daily. 180 tablet 1   Carboxymethylcellul-Glycerin (LUBRICATING EYE DROPS OP) Place 1 drop into both eyes 5 (five) times daily.     cetirizine (ZYRTEC) 10 MG tablet Take 10 mg by mouth at bedtime.     EPINEPHrine 0.3 mg/0.3 mL IJ SOAJ injection Inject 0.3 mLs (0.3 mg total) into the muscle once. (Patient taking differently: Inject 0.3 mg into the muscle as needed for anaphylaxis.) 1 Device 5   Evolocumab (REPATHA SURECLICK) 130 MG/ML SOAJ Inject 140 mg into the skin every 14 (fourteen) days. 2 mL 11   ezetimibe (ZETIA) 10 MG tablet Take 1 tablet (10 mg total) by mouth daily. 90 tablet 3   Fish Oil-Cholecalciferol (OMEGA-3 + VITAMIN D3 PO) Take 2 capsules by mouth daily.      FLUoxetine (PROZAC) 20 MG capsule Take 1 capsule (20 mg total) by mouth daily. 90 capsule 3   gabapentin (NEURONTIN) 300 MG capsule Take 1 capsule by mouth 3 (three) times daily.     metoprolol tartrate (LOPRESSOR) 25 MG tablet Take 1 tablet (25 mg total) by mouth 2 (two) times daily. 180 tablet 1   omeprazole (PRILOSEC) 20 MG capsule Take 20 mg by mouth daily.     oxybutynin (OXYTROL) 3.9 MG/24HR Place 1 patch onto the skin 2 (two) times a week. 24 patch 3   pilocarpine (SALAGEN) 5 MG tablet Take 1 tablet (5 mg total) by mouth 3 (three) times daily. 270 tablet 3   No current facility-administered medications for this visit.     Review of Systems    She  woke up with left upper chest discomfort this morning that resolved after eating a banana.  Otherwise, she denies dyspnea, palpitations, PND, orthopnea, dizziness, syncope, edema, or early satiety.  All other systems reviewed and are otherwise negative except as noted above.  Physical Exam    VS:  BP 128/88 (BP Location: Right Arm, Patient Position: Sitting, Cuff Size: Large)   Pulse 76   Ht '5\' 8"'  (1.727 m)   Wt 255 lb (115.7 kg)   SpO2 98%   BMI 38.77 kg/m  , BMI Body mass index is 38.77 kg/m.     GEN: Obese, in no acute distress. HEENT: normal. Neck: Supple, obese, difficult to gauge JVP.  No carotid bruits, or masses. Cardiac: Irregularly irregular, no murmurs, rubs, or gallops. No clubbing, cyanosis, edema.  Radials/PT 2+ and equal bilaterally.  Respiratory:  Respirations regular and unlabored, clear to auscultation  bilaterally. GI: Obese, soft, nontender, nondistended, BS + x 4. MS: no deformity or atrophy. Skin: warm and dry, no rash. Neuro:  Strength and sensation are intact. Psych: Normal affect.  Accessory Clinical Findings    ECG personally reviewed by me today -atrial fibrillation, 76, prior septal infarct - no acute changes.  Lab Results  Component Value Date   WBC 7.5 08/21/2020   HGB 13.7 08/21/2020   HCT 45.0 08/21/2020   MCV 87 08/21/2020   PLT 219 08/21/2020   Lab Results  Component Value Date   CREATININE 0.65 08/21/2020   BUN 13 08/21/2020   NA 140 08/21/2020   K 4.2 08/21/2020   CL 103 08/21/2020   CO2 20 08/21/2020   Lab Results  Component Value Date   ALT 15 08/05/2020   AST 17 08/05/2020   ALKPHOS 67 08/05/2020   BILITOT 0.7 08/05/2020   Lab Results  Component Value Date   CHOL 177 08/05/2020   HDL 46 08/05/2020   LDLCALC 114 (H) 08/05/2020   LDLDIRECT 148.9 08/20/2012   TRIG 83 08/05/2020   CHOLHDL 3.8 08/05/2020    Lab Results  Component Value Date   HGBA1C 6.1 (H) 06/14/2020    Assessment & Plan    1.  Persistent atrial  fibrillation: Status post stroke in May of this year with finding of A. fib at that time.  She has been rate controlled in the outpatient setting and recently underwent cardioversion, which was initially successful.  She is back in atrial fibrillation today.  She was unaware of this and is completely asymptomatic.  We discussed options for management and mutually agreed to continue a rate control strategy including metoprolol 25 mg twice daily with anticoagulation with Eliquis.  Labs stable prior to cardioversion last month.  2.  Hyperlipidemia: Followed in lipid clinic.  She has been on Zetia for a long time and is now also on Repatha.  Tolerating well.  Plan for follow-up lipids later this year through lipid clinic.  3.  History of stroke: No residual deficits.  She remains on Eliquis in the setting of A. fib.  Continue statin.  4.  Obstructive sleep apnea: Reports compliance with CPAP.  5.  Chest pain:  Pt had a brief episode of L upper chest pain upon awakening this AM.  She thinks that maybe it was indigestion and seem to improve after eating a banana.  She has never had chest pain like this before.  She denies any exertional symptoms.  We discussed consideration for noninvasive ischemic evaluation however, she prefers to hold off at this time but will notify us if she has any recurrent symptoms.  6.  Disposition: Follow-up in clinic in 3 months or sooner if necessary.  Murray Hodgkins, NP 09/21/2020, 3:58 PM

## 2020-09-21 NOTE — Patient Instructions (Signed)
Medication Instructions:  No changes at this time.  *If you need a refill on your cardiac medications before your next appointment, please call your pharmacy*   Lab Work: None  If you have labs (blood work) drawn today and your tests are completely normal, you will receive your results only by: [place] (if you have MyChart) OR A paper copy in the mail If you have any lab test that is abnormal or we need to change your treatment, we will call you to review the results.   Testing/Procedures: None   Follow-Up: At [facility], you and your health needs are our priority.  As part of our continuing mission to provide you with exceptional heart care, we have created designated Provider Care Teams.  These Care Teams include your primary Cardiologist (physician) and Advanced Practice Providers (APPs -  Physician Assistants and Nurse Practitioners) who all work together to provide you with the care you need, when you need it.   Your next appointment:   3 month(s)  The format for your next appointment:   In Person  Provider:   Kathlyn Sacramento, MD or Murray Hodgkins, NP

## 2020-09-22 ENCOUNTER — Telehealth: Payer: Self-pay | Admitting: *Deleted

## 2020-09-22 NOTE — Telephone Encounter (Signed)
Patient left a voicemail stating that she had a stroke several months back. Patient stated that she has been off of her Celebrex and has been in a lot of pain. Patient stated that she went to her rheumatologist and they recommended that she take gabapentin three times a day. Patient stated that she has been doing that and it has helped. Patient stated that her RA doctor recommended that her Prozac be changed  to Cymbalta. Patient stated that her rheumotologist told her that they would like this to go thru Dr. Glori Bickers. Patient stated that Dr. Glori Bickers should be able to see the [facility] notes. Pharmacy [facility]

## 2020-09-22 NOTE — Telephone Encounter (Signed)
That sounds like a good idea but I need to see her first to discuss with her. It is different than prozac and I agree may help. Please schedule in office visit when able

## 2020-09-22 NOTE — Telephone Encounter (Signed)
Please schedule f/u per Dr. Glori Bickers to discuss medication (med management f/u)

## 2020-09-29 ENCOUNTER — Ambulatory Visit (INDEPENDENT_AMBULATORY_CARE_PROVIDER_SITE_OTHER): Payer: Medicare Other | Admitting: Family Medicine

## 2020-09-29 ENCOUNTER — Encounter: Payer: Self-pay | Admitting: Family Medicine

## 2020-09-29 ENCOUNTER — Other Ambulatory Visit: Payer: Self-pay

## 2020-09-29 VITALS — BP 136/88 | HR 90 | Temp 98.0°F | Ht 68.0 in | Wt 256.3 lb

## 2020-09-29 DIAGNOSIS — E7800 Pure hypercholesterolemia, unspecified: Secondary | ICD-10-CM | POA: Diagnosis not present

## 2020-09-29 DIAGNOSIS — Z8673 Personal history of transient ischemic attack (TIA), and cerebral infarction without residual deficits: Secondary | ICD-10-CM | POA: Diagnosis not present

## 2020-09-29 DIAGNOSIS — G5603 Carpal tunnel syndrome, bilateral upper limbs: Secondary | ICD-10-CM | POA: Diagnosis not present

## 2020-09-29 DIAGNOSIS — I639 Cerebral infarction, unspecified: Secondary | ICD-10-CM | POA: Diagnosis not present

## 2020-09-29 DIAGNOSIS — M7918 Myalgia, other site: Secondary | ICD-10-CM

## 2020-09-29 DIAGNOSIS — M159 Polyosteoarthritis, unspecified: Secondary | ICD-10-CM

## 2020-09-29 DIAGNOSIS — I482 Chronic atrial fibrillation, unspecified: Secondary | ICD-10-CM | POA: Diagnosis not present

## 2020-09-29 DIAGNOSIS — M3500 Sicca syndrome, unspecified: Secondary | ICD-10-CM

## 2020-09-29 DIAGNOSIS — I4891 Unspecified atrial fibrillation: Secondary | ICD-10-CM | POA: Insufficient documentation

## 2020-09-29 DIAGNOSIS — M8949 Other hypertrophic osteoarthropathy, multiple sites: Secondary | ICD-10-CM

## 2020-09-29 MED ORDER — GABAPENTIN 300 MG PO CAPS: 300.0000 mg | ORAL_CAPSULE | Freq: Three times a day (TID) | ORAL | 11 refills | Status: DC

## 2020-09-29 MED ORDER — DULOXETINE HCL 30 MG PO CPEP: 30.0000 mg | ORAL_CAPSULE | Freq: Every day | ORAL | 5 refills | Status: DC

## 2020-09-29 NOTE — Assessment & Plan Note (Signed)
With h/o CVA Under care of cardiology Persistent  No symptoms  Did try cardioversion and it did not work On eliquis -no problems  Also metoprolol 25 mg bid-nl bp and pulse

## 2020-09-29 NOTE — Assessment & Plan Note (Signed)
Pt is doing well /back to baseline neurologically  Continues eliquis  Off celebrex

## 2020-09-29 NOTE — Assessment & Plan Note (Signed)
Continues injections with Dr Manuella Ghazi (neuro)

## 2020-09-29 NOTE — Assessment & Plan Note (Signed)
In setting of past CVA Now on repatha and doing well  Cardiologist follows, will have lipid profile after 4th shot

## 2020-09-29 NOTE — Progress Notes (Signed)
Subjective:    Patient ID: Lauren [name], female    DOB: 02-23-1945, 75 y.o.   MRN: [id number]  This visit occurred during the SARS-CoV-2 public health emergency.  Safety protocols were in place, including screening questions prior to the visit, additional usage of staff PPE, and extensive cleaning of exam room while observing appropriate contact time as indicated for disinfecting solutions.   HPI Pt presents for f/u of chronic pain and mood   Wt Readings from Last 3 Encounters:  09/29/20 256 lb 5 oz (116.3 kg)  09/21/20 255 lb (115.7 kg)  08/25/20 258 lb 4.8 oz (117.2 kg)   38.97 kg/m   Pt called on 8/16 with the following message:  Patient left a voicemail stating that she had a stroke several months back. Patient stated that she has been off of her Celebrex and has been in a lot of pain. Patient stated that she went to her rheumatologist and they recommended that she take gabapentin three times a day. Patient stated that she has been doing that and it has helped. Patient stated that her RA doctor recommended that her Prozac be changed  to Cymbalta. Patient stated that her rheumotologist told her that they would like this to go thru Dr. Glori Bickers. Patient stated that Dr. Glori Bickers should be able to see the [facility] notes.  Pharmacy [facility]  She sees rheumatology for sjogren's dz,  Dr Salvatore Marvel Stefan [place]  She has significant OA -source of her pain along with myalgia (? Fibromyalgia)  Note reviewed   Taking eliquis for CVA past with a fib   Dr Manuella Ghazi is her neurologist  CVA described as L corona radiata infarct , subcortical L paraietal lbe MCA territory  Presented with aphasia , L sided weakness and facial droop  Symptoms are better now    Blood pressure BP Readings from Last 3 Encounters:  09/29/20 136/88  09/21/20 128/88  08/25/20 116/80   Pulse Readings from Last 3 Encounters:  09/29/20 90  09/21/20 76  08/25/20 (!) 26   Was unable to cardiovert  Is in chronic a  fib Not symptomatic   Gabapentin tid has helped a lot  No side effects   Pain is worst in arms and shoulder area  Occ hip/leg (last night L leg bothered her)   Hands also hurt-carpal tunnel , has injection planned   Now on repatha for cholesterol  Lab Results  Component Value Date   CHOL 177 08/05/2020   HDL 46 08/05/2020   LDLCALC 114 (H) 08/05/2020   LDLDIRECT 148.9 08/20/2012   TRIG 83 08/05/2020   CHOLHDL 3.8 08/05/2020   Will get a test after 4th shot   Prozac helped her sleep   Mood is pretty good  She has done very well after all the big changes   Patient Active Problem List   Diagnosis Date Noted   Atrial fibrillation ([place]) 09/29/2020   Myofascial pain 09/29/2020   History of CVA (cerebrovascular accident) 06/13/2020   DNR (do not resuscitate) 06/13/2020   Elevated blood pressure reading 01/28/2020   Dermatochalasis of both upper eyelids 07/15/2019   Ptosis of both upper eyelids 07/15/2019   Carpal tunnel syndrome 01/25/2019   Age-related nuclear cataract, bilateral 06/19/2018   Vitamin B12 deficiency 03/27/2018   Sjogren's disease ([place]) 01/15/2018   Osteoarthritis 01/15/2018   Obesity (BMI 30-39.9) 01/15/2018   H/O herpes zoster 11/22/2017   Estrogen deficiency 01/09/2017   Routine general medical examination at a health care facility 01/01/2017  OSA (obstructive sleep apnea) 09/18/2016   Colon cancer screening 12/24/2014   Bee sting allergy 06/10/2014   Encounter for Medicare annual wellness exam 12/03/2013   History of shingles 11/27/2013   Hx of breast cancer 08/22/2011   Gynecological examination 02/14/2011   Bilateral dry eyes 01/20/2011   Dry eye syndrome of bilateral lacrimal glands 01/20/2011   Vitamin D deficiency 04/22/2008   Hyperlipidemia 04/22/2008   GERD 04/22/2008   Prediabetes 04/22/2008   Past Medical History:  Diagnosis Date   Breast cancer ([place]) 2002   DCIS; BRCA 1 pos HER2 pos, ER2 pos   DDD (degenerative disc disease)     DNR (do not resuscitate) 06/13/2020   GERD (gastroesophageal reflux disease)    History of echocardiogram    a. 06/2020 Echo: EF 55-60%, no rwma, nl RV size/fxn. Mildly dil LA.   HLD (hyperlipidemia)    statin intolerant   Hyperglycemia 10/2007   A1C elvated to 6.2   Obstructive sleep apnea    Osteoarthritis    Osteopenia 2007   ? from xray with nl dexa   Persistent atrial fibrillation ([place])    a. Dx 06/2020 in setting of stroke. CHA2DS2VASc = 5-->Eliquis; b. 08/2020 Successful DCCV; c. 09/2020 Office f/u- pt in rate-controlled Afib.   Reactive depression (situational)    very well controlled, and now takes prozac for sleep   Sjogren's syndrome ([place])    Stroke ([place]) 06/2020   Urine incontinence    Vitamin D deficiency    Past Surgical History:  Procedure Laterality Date   BILATERAL OOPHORECTOMY  205   CARDIOVERSION N/A 08/24/2020   Procedure: CARDIOVERSION;  Surgeon: Wellington Hampshire, MD;  Location: [facility] ORS;  Service: Cardiovascular;  Laterality: N/A;   CATARACT EXTRACTION, BILATERAL     10/2019, 12/2019   COLONOSCOPY  5/07   normal; recheck 3 years   [place]   Ruptured disk L5   MASTECTOMY  2001   bilateral   TONSILLECTOMY  1974   TOTAL HIP ARTHROPLASTY  2011   TOTAL KNEE ARTHROPLASTY  2006   bilateral   Social History   Tobacco Use   Smoking status: Never   Smokeless tobacco: Never  Vaping Use   Vaping Use: Never used  Substance Use Topics   Alcohol use: Not Currently    Alcohol/week: 0.0 standard drinks    Comment: rare, none in 2 years   Drug use: No   Family History  Problem Relation Age of Onset   Arthritis Other        family hx   Breast cancer Other        1st degree relative <50   Diabetes Other        1st degree relative   Hyperlipidemia Other        family hx   Hypertension Other        family hx   Ovarian cancer Other        family hx   Prostate cancer Other        1st degree relative <50   Dementia Mother    Psoriasis  Brother    Other Daughter        BRCA gene   Brain cancer Brother    Stroke Maternal Grandmother    Allergies  Allergen Reactions   Crestor [Rosuvastatin]     myalgias   Lipitor [Atorvastatin]     myalgias   Statins     joint pain   Wasp Venom  Swelling   Current Outpatient Medications on File Prior to Visit  Medication Sig Dispense Refill   acetaminophen (TYLENOL) 500 MG tablet Take 1,000 mg by mouth every 6 (six) hours as needed for moderate pain.     apixaban (ELIQUIS) 5 MG TABS tablet Take 1 tablet (5 mg total) by mouth 2 (two) times daily. 180 tablet 1   Carboxymethylcellul-Glycerin (LUBRICATING EYE DROPS OP) Place 1 drop into both eyes 5 (five) times daily.     cetirizine (ZYRTEC) 10 MG tablet Take 10 mg by mouth at bedtime.     EPINEPHrine 0.3 mg/0.3 mL IJ SOAJ injection Inject 0.3 mLs (0.3 mg total) into the muscle once. (Patient taking differently: Inject 0.3 mg into the muscle as needed for anaphylaxis.) 1 Device 5   Evolocumab (REPATHA SURECLICK) 161 MG/ML SOAJ Inject 140 mg into the skin every 14 (fourteen) days. 2 mL 11   ezetimibe (ZETIA) 10 MG tablet Take 1 tablet (10 mg total) by mouth daily. 90 tablet 3   Fish Oil-Cholecalciferol (OMEGA-3 + VITAMIN D3 PO) Take 2 capsules by mouth daily.      metoprolol tartrate (LOPRESSOR) 25 MG tablet Take 1 tablet (25 mg total) by mouth 2 (two) times daily. 180 tablet 1   omeprazole (PRILOSEC) 20 MG capsule Take 20 mg by mouth daily.     oxybutynin (OXYTROL) 3.9 MG/24HR Place 1 patch onto the skin 2 (two) times a week. 24 patch 3   pilocarpine (SALAGEN) 5 MG tablet Take 1 tablet (5 mg total) by mouth 3 (three) times daily. 270 tablet 3   No current facility-administered medications on file prior to visit.    Review of Systems  Constitutional:  Negative for activity change, appetite change, fatigue, fever and unexpected weight change.  HENT:  Negative for congestion, ear pain, rhinorrhea, sinus pressure and sore throat.   Eyes:   Negative for pain, redness and visual disturbance.  Respiratory:  Negative for cough, shortness of breath and wheezing.   Cardiovascular:  Negative for chest pain and palpitations.  Gastrointestinal:  Negative for abdominal pain, blood in stool, constipation and diarrhea.  Endocrine: Negative for polydipsia and polyuria.  Genitourinary:  Negative for dysuria, frequency and urgency.  Musculoskeletal:  Positive for arthralgias and myalgias. Negative for back pain and joint swelling.  Skin:  Negative for pallor and rash.  Allergic/Immunologic: Negative for environmental allergies.  Neurological:  Negative for dizziness, syncope, facial asymmetry, weakness, light-headedness and headaches.  Hematological:  Negative for adenopathy. Does not bruise/bleed easily.  Psychiatric/Behavioral:  Negative for decreased concentration and dysphoric mood. The patient is not nervous/anxious.        Mood is fairly good       Objective:   Physical Exam Constitutional:      General: She is not in acute distress.    Appearance: Normal appearance. She is well-developed. She is obese. She is not ill-appearing or diaphoretic.  HENT:     Head: Normocephalic and atraumatic.  Eyes:     Conjunctiva/sclera: Conjunctivae normal.     Pupils: Pupils are equal, round, and reactive to light.  Neck:     Thyroid: No thyromegaly.     Vascular: No carotid bruit or JVD.  Cardiovascular:     Rate and Rhythm: Normal rate. Rhythm irregular.     Pulses: Normal pulses.     Heart sounds: Normal heart sounds.    No gallop.  Pulmonary:     Effort: Pulmonary effort is normal. No respiratory distress.  Breath sounds: Normal breath sounds. No wheezing or rales.  Abdominal:     General: There is no abdominal bruit.  Musculoskeletal:     Cervical back: Normal range of motion and neck supple.     Right lower leg: No edema.     Left lower leg: No edema.     Comments: No myofascial trigger points today  Some tenderness of MCP  joints No joint swelling or deformity noted   Lymphadenopathy:     Cervical: No cervical adenopathy.  Skin:    General: Skin is warm and dry.     Coloration: Skin is not pale.     Findings: No rash.  Neurological:     Mental Status: She is alert.     Coordination: Coordination normal.     Deep Tendon Reflexes: Reflexes are normal and symmetric. Reflexes normal.  Psychiatric:        Mood and Affect: Mood and affect normal.        Cognition and Memory: Cognition and memory normal.     Comments: Pleasant  Good mood          Assessment & Plan:   Problem List Items Addressed This Visit       Cardiovascular and Mediastinum   Atrial fibrillation ([place])    With h/o CVA Under care of cardiology Persistent  No symptoms  Did try cardioversion and it did not work On eliquis -no problems  Also metoprolol 25 mg bid-nl bp and pulse        Nervous and Auditory   Carpal tunnel syndrome    Continues injections with Dr Manuella Ghazi (neuro)      Relevant Medications   DULoxetine (CYMBALTA) 30 MG capsule   gabapentin (NEURONTIN) 300 MG capsule     Musculoskeletal and Integument   Osteoarthritis     Other   Hyperlipidemia    In setting of past CVA Now on repatha and doing well  Cardiologist follows, will have lipid profile after 4th shot       Sjogren's disease ([place])    Stable with salagen Under care of neuro Notes reviewed  Per pt-suspect that her autoimmune condition causes some of the myofacial pain  Not on immune mod tx currently      History of CVA (cerebrovascular accident)    Pt is doing well /back to baseline neurologically  Continues eliquis  Off celebrex        Myofascial pain - Primary    Reviewed rheumatology notes Rev diagnosis and symptoms Cannot have celebrex any longer due to CVA  Trial of gabapentin helped this and carpal tunnel  Will adv to 300 mg tid and update  No side eff inst to watch for dizziness/sedation  Also change from fluoxetine to  cymbalta 30 mg  Discussed expectations of SNRI medication including time to effectiveness and mechanism of action, also poss of side effects (early and late)- including mental fuzziness, weight or appetite change, nausea and poss of worse dep or anxiety (even suicidal thoughts)  Pt voiced understanding and will stop med and update if this occurs   Suspect this will help as well

## 2020-09-29 NOTE — Patient Instructions (Signed)
Stop the fluoxetine and start cymbalta tonight If any intolerable side effects or if you feel worse/depressed -stop it and let us know   Low impact exercise (water) is excellent   Take care of yourself  Continue gabapentin 300 three times daily

## 2020-09-29 NOTE — Assessment & Plan Note (Signed)
Reviewed rheumatology notes Rev diagnosis and symptoms Cannot have celebrex any longer due to CVA  Trial of gabapentin helped this and carpal tunnel  Will adv to 300 mg tid and update  No side eff inst to watch for dizziness/sedation  Also change from fluoxetine to cymbalta 30 mg  Discussed expectations of SNRI medication including time to effectiveness and mechanism of action, also poss of side effects (early and late)- including mental fuzziness, weight or appetite change, nausea and poss of worse dep or anxiety (even suicidal thoughts)  Pt voiced understanding and will stop med and update if this occurs   Suspect this will help as well

## 2020-09-29 NOTE — Assessment & Plan Note (Signed)
Stable with salagen Under care of neuro Notes reviewed  Per pt-suspect that her autoimmune condition causes some of the myofacial pain  Not on immune mod tx currently

## 2020-10-14 DIAGNOSIS — G5603 Carpal tunnel syndrome, bilateral upper limbs: Secondary | ICD-10-CM | POA: Diagnosis not present

## 2020-10-21 ENCOUNTER — Other Ambulatory Visit
Admission: RE | Admit: 2020-10-21 | Discharge: 2020-10-21 | Disposition: A | Discharge status: Home / Self Care | Payer: Medicare Other | Source: Ambulatory Visit | Attending: Pharmacist | Admitting: Pharmacist

## 2020-10-21 DIAGNOSIS — E7800 Pure hypercholesterolemia, unspecified: Secondary | ICD-10-CM | POA: Insufficient documentation

## 2020-10-21 DIAGNOSIS — Z79899 Other long term (current) drug therapy: Secondary | ICD-10-CM | POA: Insufficient documentation

## 2020-10-21 DIAGNOSIS — Z5181 Encounter for therapeutic drug level monitoring: Secondary | ICD-10-CM | POA: Diagnosis not present

## 2020-10-21 DIAGNOSIS — G5603 Carpal tunnel syndrome, bilateral upper limbs: Secondary | ICD-10-CM | POA: Diagnosis not present

## 2020-10-21 LAB — LIPID PANEL
Cholesterol: 111 mg/dL (ref 0–200)
HDL: 46 mg/dL (ref >40)
LDL Cholesterol: 51 mg/dL (ref 0–99)
Total CHOL/HDL Ratio: 2.4 RATIO
Triglycerides: 71 mg/dL (ref <150)
VLDL: 14 mg/dL (ref 0–40)

## 2020-10-21 LAB — HEPATIC FUNCTION PANEL
ALT: 14 U/L (ref 0–44)
AST: 14 U/L — ABNORMAL LOW (ref 15–41)
Albumin: 3.8 g/dL (ref 3.5–5.0)
Alkaline Phosphatase: 67 U/L (ref 38–126)
Bilirubin, Direct: 0.1 mg/dL (ref 0.0–0.2)
Indirect Bilirubin: 0.6 mg/dL (ref 0.3–0.9)
Total Bilirubin: 0.7 mg/dL (ref 0.3–1.2)
Total Protein: 7 g/dL (ref 6.5–8.1)

## 2020-10-30 DIAGNOSIS — Z23 Encounter for immunization: Secondary | ICD-10-CM | POA: Diagnosis not present

## 2020-11-11 DIAGNOSIS — Z8673 Personal history of transient ischemic attack (TIA), and cerebral infarction without residual deficits: Secondary | ICD-10-CM | POA: Diagnosis not present

## 2020-11-11 DIAGNOSIS — G4733 Obstructive sleep apnea (adult) (pediatric): Secondary | ICD-10-CM | POA: Diagnosis not present

## 2020-11-11 DIAGNOSIS — G5603 Carpal tunnel syndrome, bilateral upper limbs: Secondary | ICD-10-CM | POA: Diagnosis not present

## 2020-11-11 DIAGNOSIS — M1990 Unspecified osteoarthritis, unspecified site: Secondary | ICD-10-CM | POA: Diagnosis not present

## 2020-11-11 DIAGNOSIS — G3184 Mild cognitive impairment, so stated: Secondary | ICD-10-CM | POA: Diagnosis not present

## 2020-11-11 DIAGNOSIS — Z9989 Dependence on other enabling machines and devices: Secondary | ICD-10-CM | POA: Diagnosis not present

## 2020-12-08 ENCOUNTER — Telehealth (INDEPENDENT_AMBULATORY_CARE_PROVIDER_SITE_OTHER): Payer: Medicare Other | Admitting: Nurse Practitioner

## 2020-12-08 ENCOUNTER — Encounter: Payer: Self-pay | Admitting: Nurse Practitioner

## 2020-12-08 ENCOUNTER — Other Ambulatory Visit: Payer: Self-pay

## 2020-12-08 VITALS — HR 86 | Temp 97.6°F

## 2020-12-08 DIAGNOSIS — R051 Acute cough: Secondary | ICD-10-CM

## 2020-12-08 DIAGNOSIS — J40 Bronchitis, not specified as acute or chronic: Secondary | ICD-10-CM

## 2020-12-08 MED ORDER — AZITHROMYCIN 250 MG PO TABS: ORAL_TABLET | ORAL | 0 refills | Status: AC

## 2020-12-08 MED ORDER — BENZONATATE 200 MG PO CAPS
200.0000 mg | ORAL_CAPSULE | Freq: Three times a day (TID) | ORAL | 0 refills | Status: AC | PRN
Start: 2020-12-08 — End: 2020-12-18

## 2020-12-08 NOTE — Assessment & Plan Note (Addendum)
Given length of symptoms and patient's health and comorbidities and decline in symptoms we will plan to treat with a Z-Pak.  Patient's QTC was 437 did review medications prior to prescribing.  Also offered patient an albuterol inhaler if needed that she states that she was "wheezing".  States she feels okay right now would like to hold off did review signs and symptoms that can cause tachycardia with her A. fib may cause RVR.  If we decide to do inhaler we will do levo albuterol if it is affordable.  Continue to monitor  Signs and symptoms reviewed as when to seek urgent and emergent health care.  Patient acknowledged

## 2020-12-08 NOTE — Progress Notes (Signed)
Patient ID: Lauren [name], female    DOB: 07/26/1945, 75 y.o.   MRN: [id number]  Virtual visit completed through [employer], a video enabled telemedicine application. Due to national recommendations of social distancing due to COVID-19, a virtual visit is felt to be most appropriate for this patient at this time. Reviewed limitations, risks, security and privacy concerns of performing a virtual visit and the availability of in person appointments. I also reviewed that there may be a patient responsible charge related to this service. The patient agreed to proceed.   Patient location: home Provider location: [place] at [facility], office Persons participating in this virtual visit: patient, provider   If any vitals were documented, they were collected by patient at home unless specified below.    Pulse 86   Temp 97.6 F (36.4 C)   SpO2 97%    CC: Cough Subjective:   HPI: Lauren [name] is a 75 y.o. female presenting on 12/08/2020 for Nasal Congestion (Started around 10/22 or 10/23-feeling like she was getting sick-head congestion, just feeling under weather. Then after flying on 12/01/20 home and getting delayed on the plane started having runny nose, cough-dry, chest congestion, rattling in the chest now. No sore throat. No fever, no body aches. Has been taking Mucinnex DM. Has hard time sleeping due to the cough. Covid test negative on 12/04/20)    Symptoms 10/22-10/23 Got home 10/25 COvid test on Friday 12/04/2020 and was negative Vaciinated x2 with one booster Alka seltzer sinus with tylenol Has been using mucinex DM Gotten worse with symptoms     Relevant past medical, surgical, family and social history reviewed and updated as indicated. Interim medical history since our last visit reviewed. Allergies and medications reviewed and updated. Outpatient Medications Prior to Visit  Medication Sig Dispense Refill   acetaminophen (TYLENOL) 500 MG tablet Take 1,000 mg by mouth  every 6 (six) hours as needed for moderate pain.     apixaban (ELIQUIS) 5 MG TABS tablet Take 1 tablet (5 mg total) by mouth 2 (two) times daily. 180 tablet 1   Carboxymethylcellul-Glycerin (LUBRICATING EYE DROPS OP) Place 1 drop into both eyes 5 (five) times daily.     cetirizine (ZYRTEC) 10 MG tablet Take 10 mg by mouth at bedtime.     DULoxetine (CYMBALTA) 30 MG capsule Take 1 capsule (30 mg total) by mouth daily. 30 capsule 5   EPINEPHrine 0.3 mg/0.3 mL IJ SOAJ injection Inject 0.3 mLs (0.3 mg total) into the muscle once. (Patient taking differently: Inject 0.3 mg into the muscle as needed for anaphylaxis.) 1 Device 5   Evolocumab (REPATHA SURECLICK) 382 MG/ML SOAJ Inject 140 mg into the skin every 14 (fourteen) days. 2 mL 11   Fish Oil-Cholecalciferol (OMEGA-3 + VITAMIN D3 PO) Take 2 capsules by mouth daily.      gabapentin (NEURONTIN) 300 MG capsule Take 1 capsule (300 mg total) by mouth 3 (three) times daily. 90 capsule 11   metoprolol tartrate (LOPRESSOR) 25 MG tablet Take 1 tablet (25 mg total) by mouth 2 (two) times daily. 180 tablet 1   omeprazole (PRILOSEC) 20 MG capsule Take 20 mg by mouth daily.     oxybutynin (OXYTROL) 3.9 MG/24HR Place 1 patch onto the skin 2 (two) times a week. 24 patch 3   pilocarpine (SALAGEN) 5 MG tablet Take 1 tablet (5 mg total) by mouth 3 (three) times daily. 270 tablet 3   ezetimibe (ZETIA) 10 MG tablet Take 1 tablet (10 mg total) by  mouth daily. 90 tablet 3   No facility-administered medications prior to visit.     Per HPI unless specifically indicated in ROS section below Review of Systems  Constitutional:  Positive for fatigue. Negative for chills and fever.  HENT:  Positive for rhinorrhea. Negative for sinus pressure, sinus pain and sore throat (throat).   Respiratory:  Positive for cough, shortness of breath (DOE) and wheezing.   Cardiovascular:  Negative for chest pain.  Gastrointestinal:  Negative for abdominal pain, diarrhea, nausea and  vomiting.  Musculoskeletal:  Negative for arthralgias and myalgias.  Neurological:  Negative for headaches.  Objective:  Pulse 86   Temp 97.6 F (36.4 C)   SpO2 97%   Wt Readings from Last 3 Encounters:  09/29/20 256 lb 5 oz (116.3 kg)  09/21/20 255 lb (115.7 kg)  08/25/20 258 lb 4.8 oz (117.2 kg)       Physical exam: Gen: alert, NAD, not ill appearing Pulm: speaks in complete sentences without increased work of breathing Psych: normal mood, normal thought content      Results for orders placed or performed during the hospital encounter of 10/21/20  Lipid panel  Result Value Ref Range   Cholesterol 111 0 - 200 mg/dL   Triglycerides 71 <150 mg/dL   HDL 46 >40 mg/dL   Total CHOL/HDL Ratio 2.4 RATIO   VLDL 14 0 - 40 mg/dL   LDL Cholesterol 51 0 - 99 mg/dL  Hepatic function panel  Result Value Ref Range   Total Protein 7.0 6.5 - 8.1 g/dL   Albumin 3.8 3.5 - 5.0 g/dL   AST 14 (L) 15 - 41 U/L   ALT 14 0 - 44 U/L   Alkaline Phosphatase 67 38 - 126 U/L   Total Bilirubin 0.7 0.3 - 1.2 mg/dL   Bilirubin, Direct 0.1 0.0 - 0.2 mg/dL   Indirect Bilirubin 0.6 0.3 - 0.9 mg/dL   Assessment & Plan:   Problem List Items Addressed This Visit       Respiratory   Bronchitis - Primary    Given length of symptoms and patient's health and comorbidities and decline in symptoms we will plan to treat with a Z-Pak.  Patient's QTC was 437 did review medications prior to prescribing.  Also offered patient an albuterol inhaler if needed that she states that she was "wheezing".  States she feels okay right now would like to hold off did review signs and symptoms that can cause tachycardia with her A. fib may cause RVR.  If we decide to do inhaler we will do levo albuterol if it is affordable.  Continue to monitor      Relevant Medications   azithromycin (ZITHROMAX) 250 MG tablet     Other   Acute cough     Some Tessalon Perles to aid in patient's cough.  Continue to monitor      Relevant  Medications   benzonatate (TESSALON) 200 MG capsule     No orders of the defined types were placed in this encounter.  No orders of the defined types were placed in this encounter.   I discussed the assessment and treatment plan with the patient. The patient was provided an opportunity to ask questions and all were answered. The patient agreed with the plan and demonstrated an understanding of the instructions. The patient was advised to call back or seek an in-person evaluation if the symptoms worsen or if the condition fails to improve as anticipated.  Follow up plan: No  follow-ups on file.  Romilda Garret, NP

## 2020-12-08 NOTE — Assessment & Plan Note (Signed)
  Some Tessalon Perles to aid in patient's cough.  Continue to monitor

## 2020-12-11 ENCOUNTER — Ambulatory Visit: Payer: Medicare Other | Attending: Neurology | Admitting: Speech Pathology

## 2020-12-11 ENCOUNTER — Other Ambulatory Visit: Payer: Self-pay

## 2020-12-11 DIAGNOSIS — G3184 Mild cognitive impairment, so stated: Secondary | ICD-10-CM | POA: Diagnosis not present

## 2020-12-11 DIAGNOSIS — R41841 Cognitive communication deficit: Secondary | ICD-10-CM | POA: Diagnosis not present

## 2020-12-12 NOTE — Therapy (Signed)
[place] MAIN [facility] SERVICES [address] [place], [place], [address] Phone: [phone]   Fax:  [phone]  Speech Language Pathology Evaluation  Patient Details  Name: Lauren [name] MRN: [id number] Date of Birth: 21-Feb-1945 Referring Provider (SLP): Jennings Books   Encounter Date: 12/11/2020   End of Session - 12/12/20 1303     Visit Number 1    Number of Visits 9    Date for SLP Re-Evaluation 01/09/21    Authorization Type Medicare    Authorization Time Period 12/11/2020 thru 01/09/2021    Authorization - Visit Number 1    Progress Note Due on Visit 10    SLP Start Time 0900    SLP Stop Time  1000    SLP Time Calculation (min) 60 min    Activity Tolerance Patient tolerated treatment well             Past Medical History:  Diagnosis Date   Breast cancer ([place]) 2002   DCIS; BRCA 1 pos HER2 pos, ER2 pos   DDD (degenerative disc disease)    DNR (do not resuscitate) 06/13/2020   GERD (gastroesophageal reflux disease)    History of echocardiogram    a. 06/2020 Echo: EF 55-60%, no rwma, nl RV size/fxn. Mildly dil LA.   HLD (hyperlipidemia)    statin intolerant   Hyperglycemia 10/2007   A1C elvated to 6.2   Obstructive sleep apnea    Osteoarthritis    Osteopenia 2007   ? from xray with nl dexa   Persistent atrial fibrillation ([place])    a. Dx 06/2020 in setting of stroke. CHA2DS2VASc = 5-->Eliquis; b. 08/2020 Successful DCCV; c. 09/2020 Office f/u- pt in rate-controlled Afib.   Reactive depression (situational)    very well controlled, and now takes prozac for sleep   Sjogren's syndrome ([place])    Stroke ([place]) 06/2020   Urine incontinence    Vitamin D deficiency     Past Surgical History:  Procedure Laterality Date   BILATERAL OOPHORECTOMY  205   CARDIOVERSION N/A 08/24/2020   Procedure: CARDIOVERSION;  Surgeon: Wellington Hampshire, MD;  Location: [facility];  Service: Cardiovascular;  Laterality: N/A;   CATARACT EXTRACTION,  BILATERAL     10/2019, 12/2019   COLONOSCOPY  5/07   normal; recheck 3 years   [place]   Ruptured disk L5   MASTECTOMY  2001   bilateral   TONSILLECTOMY  1974   TOTAL HIP ARTHROPLASTY  2011   TOTAL KNEE ARTHROPLASTY  2006   bilateral    There were no vitals filed for this visit.   Subjective Assessment - 12/12/20 1249     Subjective pt pleasant, good historian    Currently in Pain? No/denies                SLP Evaluation [facility] - 12/12/20 1249       SLP Visit Information   SLP Received On 12/11/20    Referring Provider (SLP) Jennings Books    Onset Date 11/11/2020    Medical Diagnosis Mild Cognitive Impairment      General Information   HPI Pt is a 75 year old female who was referred by Dr Jennings Books for a comprehensive cognition evaluation d/t concern of mild cognitive impairment (SLUMS 28/30 11/11/2020). History of ischemic left corona radiata infarct and subcortical left parietal lobe left MCA territory (06/2020) with no residual deficits, Atrial Fibrillation, Obstructive Sleep Apnea, COVID 03/2020.    Behavioral/Cognition  appropriate    Mobility Status ambulatory      Balance Screen   Has the patient fallen in the past 6 months No    Has the patient had a decrease in activity level because of a fear of falling?  No    Is the patient reluctant to leave their home because of a fear of falling?  No      Prior Functional Status   Cognitive/Linguistic Baseline Within functional limits    Type of Home House     Lives With Alone      Cognition   Overall Cognitive Status Within Functional Limits for tasks assessed    Executive Function Reasoning    Reasoning Impaired    Reasoning Impairment Verbal complex;Functional complex      Auditory Comprehension   Overall Auditory Comprehension Appears within functional limits for tasks assessed      Visual Recognition/Discrimination   Discrimination Within Function Limits      Reading Comprehension    Reading Status Within funtional limits      Expression   Primary Mode of Expression Verbal      Verbal Expression   Overall Verbal Expression Appears within functional limits for tasks assessed      Written Expression   Dominant Hand Right    Written Expression Within Functional Limits      Oral Motor/Sensory Function   Overall Oral Motor/Sensory Function Appears within functional limits for tasks assessed      Motor Speech   Overall Motor Speech Appears within functional limits for tasks assessed      Standardized Assessments   Standardized Assessments  Cognitive Linguistic Quick Test      Cognitive Linguistic Quick Test (Ages 18-69)   Attention WNL    Memory WNL    Executive Function WNL    Language WNL    Visuospatial Skills WNL    Severity Rating Total 20    Composite Severity Rating 16.8                             SLP Education - 12/12/20 1303     Education Details results of assessment, ST POC    Person(s) Educated Patient    Methods Explanation    Comprehension Verbalized understanding                SLP Long Term Goals - 12/12/20 1310       SLP LONG TERM GOAL #1   Title Pt will complete higher level executive function tasks with modified independence.    Baseline mild impairment    Time 4    Period Weeks    Status New    Target Date 01/09/21              Plan - 12/12/20 1304     Clinical Impression Statement Pt presents with mild executive function impairment as evidenced by her below criterion score on design generation subtest within the Cognitive Linguistic Quick Test. subjectively, pt is impulsive with fast rate of completion which results in multiple errors during initial attempt. Pt does, however, review her work and she does make corrections. While this promotes increased accuracy, when completing reasoning tasks that rely on correct information throughout task, pt's accuracy declines significant. Recommend a short  course of ST intervention to target reasoning and executive functions.    Speech Therapy Frequency 2x / week    Duration 4 weeks    Treatment/Interventions  Internal/external aids;Patient/family education;SLP instruction and feedback    Potential to Achieve Goals Good    Potential Considerations Family/community support    Consulted and Agree with Plan of Care Patient             Patient will benefit from skilled therapeutic intervention in order to improve the following deficits and impairments:   Cognitive communication deficit  Mild cognitive impairment with memory loss    Problem List Patient Active Problem List   Diagnosis Date Noted   Acute cough 12/08/2020   Atrial fibrillation ([place]) 09/29/2020   Myofascial pain 09/29/2020   History of CVA (cerebrovascular accident) 06/13/2020   DNR (do not resuscitate) 06/13/2020   Elevated blood pressure reading 01/28/2020   Dermatochalasis of both upper eyelids 07/15/2019   Ptosis of both upper eyelids 07/15/2019   Carpal tunnel syndrome 01/25/2019   Age-related nuclear cataract, bilateral 06/19/2018   Vitamin B12 deficiency 03/27/2018   Sjogren's disease ([place]) 01/15/2018   Osteoarthritis 01/15/2018   Obesity (BMI 30-39.9) 01/15/2018   H/O herpes zoster 11/22/2017   Estrogen deficiency 01/09/2017   Routine general medical examination at a health care facility 01/01/2017   OSA (obstructive sleep apnea) 09/18/2016   Colon cancer screening 12/24/2014   Bee sting allergy 06/10/2014   Encounter for Medicare annual wellness exam 12/03/2013   History of shingles 11/27/2013   Hx of breast cancer 08/22/2011   Gynecological examination 02/14/2011   Bilateral dry eyes 01/20/2011   Dry eye syndrome of bilateral lacrimal glands 01/20/2011   Bronchitis 01/14/2011   Vitamin D deficiency 04/22/2008   Hyperlipidemia 04/22/2008   GERD 04/22/2008   Prediabetes 04/22/2008   Ahsley Attwood B. Rutherford Nail M.S., CCC-SLP, [place]  Pathologist Rehabilitation Services Office [phone]  Stormy Fabian 12/12/2020, 1:12 PM  [place] MAIN [facility] SERVICES [address] [place], [place], [address] Phone: [phone]   Fax:  [phone]  Name: Lauren [name] MRN: [id number] Date of Birth: [birth year]

## 2020-12-15 DIAGNOSIS — Z08 Encounter for follow-up examination after completed treatment for malignant neoplasm: Secondary | ICD-10-CM | POA: Diagnosis not present

## 2020-12-15 DIAGNOSIS — Z85828 Personal history of other malignant neoplasm of skin: Secondary | ICD-10-CM | POA: Diagnosis not present

## 2020-12-16 ENCOUNTER — Other Ambulatory Visit: Payer: Self-pay

## 2020-12-16 ENCOUNTER — Ambulatory Visit: Payer: Medicare Other | Admitting: Speech Pathology

## 2020-12-16 DIAGNOSIS — G3184 Mild cognitive impairment, so stated: Secondary | ICD-10-CM | POA: Diagnosis not present

## 2020-12-16 DIAGNOSIS — R41841 Cognitive communication deficit: Secondary | ICD-10-CM

## 2020-12-17 NOTE — Therapy (Signed)
[place] MAIN [facility] SERVICES [address] [place], [place], [address] Phone: [phone]   Fax:  [phone]  Speech Language Pathology Treatment DISCHARGE SUMMARY  Patient Details  Name: Lauren [name] MRN: [id number] Date of Birth: 22-Apr-1945 Referring Provider (SLP): Jennings Books   Encounter Date: 12/16/2020   End of Session - 12/17/20 0900     Visit Number 2    SLP Start Time 1100    SLP Stop Time  1200    SLP Time Calculation (min) 60 min    Activity Tolerance Patient tolerated treatment well             Past Medical History:  Diagnosis Date   Breast cancer ([place]) 2002   DCIS; BRCA 1 pos HER2 pos, ER2 pos   DDD (degenerative disc disease)    DNR (do not resuscitate) 06/13/2020   GERD (gastroesophageal reflux disease)    History of echocardiogram    a. 06/2020 Echo: EF 55-60%, no rwma, nl RV size/fxn. Mildly dil LA.   HLD (hyperlipidemia)    statin intolerant   Hyperglycemia 10/2007   A1C elvated to 6.2   Obstructive sleep apnea    Osteoarthritis    Osteopenia 2007   ? from xray with nl dexa   Persistent atrial fibrillation ([place])    a. Dx 06/2020 in setting of stroke. CHA2DS2VASc = 5-->Eliquis; b. 08/2020 Successful DCCV; c. 09/2020 Office f/u- pt in rate-controlled Afib.   Reactive depression (situational)    very well controlled, and now takes prozac for sleep   Sjogren's syndrome ([place])    Stroke ([place]) 06/2020   Urine incontinence    Vitamin D deficiency     Past Surgical History:  Procedure Laterality Date   BILATERAL OOPHORECTOMY  205   CARDIOVERSION N/A 08/24/2020   Procedure: CARDIOVERSION;  Surgeon: Wellington Hampshire, MD;  Location: [facility];  Service: Cardiovascular;  Laterality: N/A;   CATARACT EXTRACTION, BILATERAL     10/2019, 12/2019   COLONOSCOPY  5/07   normal; recheck 3 years   [place]   Ruptured disk L5   MASTECTOMY  2001   bilateral   TONSILLECTOMY  1974   TOTAL HIP ARTHROPLASTY   2011   TOTAL KNEE ARTHROPLASTY  2006   bilateral    There were no vitals filed for this visit.   Subjective Assessment - 12/17/20 0853     Subjective pt pleasant, motivated    Currently in Pain? No/denies                   ADULT SLP TREATMENT - 12/17/20 0001       Treatment Provided   Treatment provided Cognitive-Linquistic      Cognitive-Linquistic Treatment   Treatment focused on Cognition;Patient/family/caregiver education    Skilled Treatment Skilled treatment session focused on completing educaiton on low tech and high tech ways to maintain memory wihtin daily tasks. SLP intructed pt in ways to use her cell phone to make lists, set timers for reminders, use picture gallery for increased recall of items, medical alert app. Pt able to return demonstration of all concepts introduced. Compensatory memory strategies discussed with handout given.              SLP Education - 12/17/20 0859     Education Details high tech and low tech ways to maximize memory and cognitive organization    Person(s) Educated Patient    Methods Explanation;Demonstration;Verbal cues;Handout    Comprehension Verbalized  understanding;Returned demonstration                SLP Long Term Goals - 12/12/20 1310       SLP LONG TERM GOAL #1   Title Pt will complete higher level executive function tasks with modified independence.    Baseline mild impairment    Time 4    Period Weeks    Status New    Target Date 01/09/21              Plan - 12/17/20 0900     Clinical Impression Statement Pt able to return demonstration of concepts introduced to strengthen memory and cognitive organization. Pt was not impulsive during today's session, indicating that she understood previously taught information. At this time, pt continues to be functionally independent with cognitive communication functions and skilled ST intervention is not indicated at this time. Pt voiced agreement and  relief that her abilities were considered within normal range.    Treatment/Interventions Internal/external aids;Patient/family education;SLP instruction and feedback    Potential to [place] provided see pt instructions section    Consulted and Agree with Plan of Care Patient             Problem List Patient Active Problem List   Diagnosis Date Noted   Acute cough 12/08/2020   Atrial fibrillation ([place]) 09/29/2020   Myofascial pain 09/29/2020   History of CVA (cerebrovascular accident) 06/13/2020   DNR (do not resuscitate) 06/13/2020   Elevated blood pressure reading 01/28/2020   Dermatochalasis of both upper eyelids 07/15/2019   Ptosis of both upper eyelids 07/15/2019   Carpal tunnel syndrome 01/25/2019   Age-related nuclear cataract, bilateral 06/19/2018   Vitamin B12 deficiency 03/27/2018   Sjogren's disease ([place]) 01/15/2018   Osteoarthritis 01/15/2018   Obesity (BMI 30-39.9) 01/15/2018   H/O herpes zoster 11/22/2017   Estrogen deficiency 01/09/2017   Routine general medical examination at a health care facility 01/01/2017   OSA (obstructive sleep apnea) 09/18/2016   Colon cancer screening 12/24/2014   Bee sting allergy 06/10/2014   Encounter for Medicare annual wellness exam 12/03/2013   History of shingles 11/27/2013   Hx of breast cancer 08/22/2011   Gynecological examination 02/14/2011   Bilateral dry eyes 01/20/2011   Dry eye syndrome of bilateral lacrimal glands 01/20/2011   Bronchitis 01/14/2011   Vitamin D deficiency 04/22/2008   Hyperlipidemia 04/22/2008   GERD 04/22/2008   Prediabetes 04/22/2008   [place] Petrich B. Rutherford Nail M.S., CCC-SLP, [place] Pathologist Rehabilitation Services Office [phone]  Stormy Fabian 12/17/2020, 9:03 AM  [place] MAIN [facility] SERVICES [address] [place], [place], [address] Phone: [phone]   Fax:  [phone]   Name: Lauren [name] MRN: [id number] Date of Birth: 09-24-1945

## 2020-12-17 NOTE — Patient Instructions (Signed)
Use compensatory memory strategies as well as cell phone to help maximize memory and organization

## 2020-12-18 ENCOUNTER — Ambulatory Visit: Payer: Medicare Other | Admitting: Speech Pathology

## 2020-12-18 ENCOUNTER — Telehealth: Payer: Self-pay | Admitting: Pulmonary Disease

## 2020-12-18 DIAGNOSIS — G4733 Obstructive sleep apnea (adult) (pediatric): Secondary | ICD-10-CM

## 2020-12-18 NOTE — Telephone Encounter (Signed)
Call back on 11/14- [facility] closed at this time.

## 2020-12-18 NOTE — Telephone Encounter (Signed)
Call made to patient, confirmed DOB. She reports her cpap is not working and DME will sending over a Designer, multimedia. She states [facility] will need the cpap order. She states she had a stroke in May and really needs the machine.   Call made to [employer], spoke with [place]. They do not need any additional information from [place]. They are going to have their tech department work on the machine and they will send a email and call us if anything else is needed.   Nothing further needed at this time.

## 2020-12-22 ENCOUNTER — Ambulatory Visit: Payer: Medicare Other | Admitting: Speech Pathology

## 2020-12-22 ENCOUNTER — Encounter: Payer: Self-pay | Admitting: Cardiovascular Disease

## 2020-12-22 ENCOUNTER — Ambulatory Visit (INDEPENDENT_AMBULATORY_CARE_PROVIDER_SITE_OTHER): Payer: Medicare Other | Admitting: Cardiovascular Disease

## 2020-12-22 ENCOUNTER — Other Ambulatory Visit: Payer: Self-pay

## 2020-12-22 VITALS — BP 118/84 | HR 91 | Ht 68.0 in | Wt 261.5 lb

## 2020-12-22 DIAGNOSIS — I639 Cerebral infarction, unspecified: Secondary | ICD-10-CM | POA: Diagnosis not present

## 2020-12-22 DIAGNOSIS — E785 Hyperlipidemia, unspecified: Secondary | ICD-10-CM

## 2020-12-22 DIAGNOSIS — I4821 Permanent atrial fibrillation: Secondary | ICD-10-CM

## 2020-12-22 DIAGNOSIS — Z8673 Personal history of transient ischemic attack (TIA), and cerebral infarction without residual deficits: Secondary | ICD-10-CM | POA: Diagnosis not present

## 2020-12-22 NOTE — Progress Notes (Signed)
Cardiology Office Note   Date:  12/22/2020   ID:  Lauren [name], DOB 1945/05/22, MRN [id number]  PCP:  Abner Greenspan, MD  Cardiologist:   Kathlyn Sacramento, MD   Chief Complaint  Patient presents with   Other    3 month f/u. No complaints today. Meds reviewed verbally with pt.       History of Present Illness: Lauren [name] is a 75 y.o. female who was is here today for follow-up visit regarding persistent atrial fibrillation.   She has past medical history of breast cancer status post bilateral mastectomy, hyperlipidemia with intolerance to statins, borderline diabetes, osteopenia, reactive depression, Sjogren's syndrome, urinary incontinence and vitamin D deficiency. She was hospitalized in May of this year with acute onset of slurred speech, facial droop and aphasia.  MRI confirmed the stroke.  EKG showed atrial fibrillation.  She was started on anticoagulation with Eliquis.  Her stroke was thought to be embolic.  Echocardiogram showed normal LV systolic function without significant valvular abnormalities.  Left atrium was mildly dilated.  She reports trying statins more than 20 years ago and had severe myalgia but has not been rechallenged since then.  She underwent successful cardioversion in July.  However, she was noted to be back in atrial fibrillation shortly after follow-up.  Given minimal symptoms, I elected to continue rate control.  She has been doing very well with no chest pain, shortness of breath or palpitations.  She almost completely recovered from her stroke. She reported 1 episode of chest pain during last visit which was described as indigestion.  No recurrent symptoms since then.  Past Medical History:  Diagnosis Date   Breast cancer ([place]) 2002   DCIS; BRCA 1 pos HER2 pos, ER2 pos   DDD (degenerative disc disease)    DNR (do not resuscitate) 06/13/2020   GERD (gastroesophageal reflux disease)    History of echocardiogram    a. 06/2020 Echo: EF 55-60%, no  rwma, nl RV size/fxn. Mildly dil LA.   HLD (hyperlipidemia)    statin intolerant   Hyperglycemia 10/2007   A1C elvated to 6.2   Obstructive sleep apnea    Osteoarthritis    Osteopenia 2007   ? from xray with nl dexa   Persistent atrial fibrillation ([place])    a. Dx 06/2020 in setting of stroke. CHA2DS2VASc = 5-->Eliquis; b. 08/2020 Successful DCCV; c. 09/2020 Office f/u- pt in rate-controlled Afib.   Reactive depression (situational)    very well controlled, and now takes prozac for sleep   Sjogren's syndrome ([place])    Stroke ([place]) 06/2020   Urine incontinence    Vitamin D deficiency     Past Surgical History:  Procedure Laterality Date   BILATERAL OOPHORECTOMY  205   CARDIOVERSION N/A 08/24/2020   Procedure: CARDIOVERSION;  Surgeon: Wellington Hampshire, MD;  Location: [facility] ORS;  Service: Cardiovascular;  Laterality: N/A;   CATARACT EXTRACTION, BILATERAL     10/2019, 12/2019   COLONOSCOPY  5/07   normal; recheck 3 years   LUMBAR DISC SURGERY  1991   Ruptured disk L5   MASTECTOMY  2001   bilateral   TONSILLECTOMY  1974   TOTAL HIP ARTHROPLASTY  2011   TOTAL KNEE ARTHROPLASTY  2006   bilateral     Current Outpatient Medications  Medication Sig Dispense Refill   acetaminophen (TYLENOL) 500 MG tablet Take 1,000 mg by mouth every 6 (six) hours as needed for moderate pain.     apixaban (ELIQUIS) 5  MG TABS tablet Take 1 tablet (5 mg total) by mouth 2 (two) times daily. 180 tablet 1   Carboxymethylcellul-Glycerin (LUBRICATING EYE DROPS OP) Place 1 drop into both eyes 5 (five) times daily.     cetirizine (ZYRTEC) 10 MG tablet Take 10 mg by mouth at bedtime.     DULoxetine (CYMBALTA) 30 MG capsule Take 1 capsule (30 mg total) by mouth daily. 30 capsule 5   EPINEPHrine 0.3 mg/0.3 mL IJ SOAJ injection Inject 0.3 mLs (0.3 mg total) into the muscle once. (Patient taking differently: Inject 0.3 mg into the muscle as needed for anaphylaxis.) 1 Device 5   Evolocumab (REPATHA SURECLICK) 903 MG/ML  SOAJ Inject 140 mg into the skin every 14 (fourteen) days. 2 mL 11   Fish Oil-Cholecalciferol (OMEGA-3 + VITAMIN D3 PO) Take 2 capsules by mouth daily.      gabapentin (NEURONTIN) 300 MG capsule Take 1 capsule (300 mg total) by mouth 3 (three) times daily. 90 capsule 11   metoprolol tartrate (LOPRESSOR) 25 MG tablet Take 1 tablet (25 mg total) by mouth 2 (two) times daily. 180 tablet 1   omeprazole (PRILOSEC) 20 MG capsule Take 20 mg by mouth daily.     oxybutynin (OXYTROL) 3.9 MG/24HR Place 1 patch onto the skin 2 (two) times a week. 24 patch 3   pilocarpine (SALAGEN) 5 MG tablet Take 1 tablet (5 mg total) by mouth 3 (three) times daily. 270 tablet 3   No current facility-administered medications for this visit.    Allergies:   Crestor [rosuvastatin], Lipitor [atorvastatin], Statins, and Wasp venom    Social History:  The patient  reports that she has never smoked. She has never used smokeless tobacco. She reports that she does not currently use alcohol. She reports that she does not use drugs.   Family History:  The patient's family history includes Arthritis in an other family member; Brain cancer in her brother; Breast cancer in an other family member; Dementia in her mother; Diabetes in an other family member; Hyperlipidemia in an other family member; Hypertension in an other family member; Other in her daughter; Ovarian cancer in an other family member; Prostate cancer in an other family member; Psoriasis in her brother; Stroke in her maternal grandmother.    ROS:  Please see the history of present illness.   Otherwise, review of systems are positive for none.   All other systems are reviewed and negative.    PHYSICAL EXAM: VS:  BP 118/84 (BP Location: Left Arm, Patient Position: Sitting, Cuff Size: Large)   Pulse 91   Ht '5\' 8"'  (1.727 m)   Wt 261 lb 8 oz (118.6 kg)   SpO2 98%   BMI 39.76 kg/m  , BMI Body mass index is 39.76 kg/m. GEN: Well nourished, well developed, in no acute  distress  HEENT: normal  Neck: no JVD, carotid bruits, or masses Cardiac: Irregularly irregular; no murmurs, rubs, or gallops,no edema  Respiratory:  clear to auscultation bilaterally, normal work of breathing GI: soft, nontender, nondistended, + BS MS: no deformity or atrophy  Skin: warm and dry, no rash Neuro:  Strength and sensation are intact Psych: euthymic mood, full affect   EKG:  EKG is ordered today. The ekg ordered today demonstrates atrial fibrillation with ventricular rate of 91 bpm.  Nonspecific T wave changes.  Recent Labs: 06/14/2020: TSH 0.744 06/15/2020: Magnesium 2.0 08/21/2020: BUN 13; Creatinine, Ser 0.65; Hemoglobin 13.7; Platelets 219; Potassium 4.2; Sodium 140 10/21/2020: ALT 14  Lipid Panel    Component Value Date/Time   CHOL 111 10/21/2020 1042   TRIG 71 10/21/2020 1042   HDL 46 10/21/2020 1042   CHOLHDL 2.4 10/21/2020 1042   VLDL 14 10/21/2020 1042   LDLCALC 51 10/21/2020 1042   LDLDIRECT 148.9 08/20/2012 0831      Wt Readings from Last 3 Encounters:  12/22/20 261 lb 8 oz (118.6 kg)  09/29/20 256 lb 5 oz (116.3 kg)  09/21/20 255 lb (115.7 kg)       PAD Screen 06/25/2020  Previous PAD dx? No  Previous surgical procedure? No  Pain with walking? No  Feet/toe relief with dangling? No  Painful, non-healing ulcers? No  Extremities discolored? No      ASSESSMENT AND PLAN:  1.  Permanent atrial fibrillation: Ventricular rate is reasonably controlled with metoprolol 25 mg twice daily and she is overall asymptomatic.   CHA2DS2-VASc score is 5.  Recommend continuing long-term anticoagulation with Eliquis.   2.  Hyperlipidemia: Known history of intolerance to statins but she is doing extremely well with Repatha.  I reviewed most recent lipid profile which showed significant improvement in LDL down to 51.     Disposition:   FU with me in 6 months  Signed,  Kathlyn Sacramento, MD  12/22/2020 3:23 PM     [facility]

## 2020-12-22 NOTE — Telephone Encounter (Signed)
Spoke with the pt  She is aware that the order for CPAP was placed  Nothing further needed

## 2020-12-22 NOTE — Telephone Encounter (Signed)
Went ahead and put in order for loaner CPAP  LMTCB for the pt

## 2020-12-22 NOTE — Addendum Note (Signed)
Addended by: Rosana Berger on: 12/22/2020 10:45 AM   Modules accepted: Orders

## 2020-12-22 NOTE — Patient Instructions (Signed)

## 2020-12-24 ENCOUNTER — Ambulatory Visit: Payer: Medicare Other | Admitting: Speech Pathology

## 2020-12-25 ENCOUNTER — Other Ambulatory Visit: Payer: Self-pay | Admitting: Cardiovascular Disease

## 2020-12-25 NOTE — Telephone Encounter (Signed)
Refill Request.  

## 2020-12-28 NOTE — Telephone Encounter (Signed)
Eliquis 5 mg refill request received. Patient is 75 years old, weight- 118.6 kg, Crea-0.65 on 08/21/20, Diagnosis-afib, and last seen by Dr. Fletcher Anon on 12/22/20. Dose is appropriate based on dosing criteria. Will send in refill to requested pharmacy.

## 2020-12-30 ENCOUNTER — Encounter: Payer: Medicare Other | Admitting: Speech Pathology

## 2021-01-04 DIAGNOSIS — M3509 Sicca syndrome with other organ involvement: Secondary | ICD-10-CM | POA: Diagnosis not present

## 2021-01-04 DIAGNOSIS — M159 Polyosteoarthritis, unspecified: Secondary | ICD-10-CM | POA: Diagnosis not present

## 2021-01-04 DIAGNOSIS — M791 Myalgia, unspecified site: Secondary | ICD-10-CM | POA: Diagnosis not present

## 2021-01-06 ENCOUNTER — Encounter: Payer: Medicare Other | Admitting: Speech Pathology

## 2021-01-13 ENCOUNTER — Encounter: Payer: Medicare Other | Admitting: Speech Pathology

## 2021-01-15 ENCOUNTER — Encounter: Payer: Medicare Other | Admitting: Speech Pathology

## 2021-01-18 ENCOUNTER — Encounter: Payer: Medicare Other | Admitting: Speech Pathology

## 2021-01-18 DIAGNOSIS — H903 Sensorineural hearing loss, bilateral: Secondary | ICD-10-CM | POA: Diagnosis not present

## 2021-01-21 ENCOUNTER — Encounter: Payer: Medicare Other | Admitting: Speech Pathology

## 2021-01-26 ENCOUNTER — Encounter: Payer: Medicare Other | Admitting: Speech Pathology

## 2021-01-28 ENCOUNTER — Encounter: Payer: Medicare Other | Admitting: Speech Pathology

## 2021-02-03 ENCOUNTER — Encounter: Payer: Medicare Other | Admitting: Speech Pathology

## 2021-02-05 ENCOUNTER — Encounter: Payer: Medicare Other | Admitting: Speech Pathology

## 2021-02-09 ENCOUNTER — Encounter: Payer: Medicare Other | Admitting: Speech Pathology

## 2021-02-09 ENCOUNTER — Telehealth: Payer: Self-pay | Admitting: Family Medicine

## 2021-02-09 DIAGNOSIS — E7800 Pure hypercholesterolemia, unspecified: Secondary | ICD-10-CM

## 2021-02-09 DIAGNOSIS — I4821 Permanent atrial fibrillation: Secondary | ICD-10-CM

## 2021-02-09 DIAGNOSIS — E559 Vitamin D deficiency, unspecified: Secondary | ICD-10-CM

## 2021-02-09 DIAGNOSIS — E538 Deficiency of other specified B group vitamins: Secondary | ICD-10-CM

## 2021-02-09 DIAGNOSIS — R030 Elevated blood-pressure reading, without diagnosis of hypertension: Secondary | ICD-10-CM

## 2021-02-09 DIAGNOSIS — R7303 Prediabetes: Secondary | ICD-10-CM

## 2021-02-09 DIAGNOSIS — Z0000 Encounter for general adult medical examination without abnormal findings: Secondary | ICD-10-CM

## 2021-02-09 NOTE — Telephone Encounter (Signed)
-----   Message from Ellamae Sia sent at 01/26/2021  8:08 AM EST ----- Regarding: Lab orders for Friday, 1.6.23 Patient is scheduled for CPX labs, please order future labs, Thanks , [name]

## 2021-02-11 ENCOUNTER — Encounter: Payer: Medicare Other | Admitting: Speech Pathology

## 2021-02-12 ENCOUNTER — Other Ambulatory Visit: Payer: Self-pay

## 2021-02-12 ENCOUNTER — Other Ambulatory Visit (INDEPENDENT_AMBULATORY_CARE_PROVIDER_SITE_OTHER): Payer: Medicare Other

## 2021-02-12 ENCOUNTER — Other Ambulatory Visit: Payer: Medicare Other

## 2021-02-12 DIAGNOSIS — R7303 Prediabetes: Secondary | ICD-10-CM

## 2021-02-12 DIAGNOSIS — R030 Elevated blood-pressure reading, without diagnosis of hypertension: Secondary | ICD-10-CM

## 2021-02-12 DIAGNOSIS — E538 Deficiency of other specified B group vitamins: Secondary | ICD-10-CM | POA: Diagnosis not present

## 2021-02-12 DIAGNOSIS — E7800 Pure hypercholesterolemia, unspecified: Secondary | ICD-10-CM | POA: Diagnosis not present

## 2021-02-12 DIAGNOSIS — E559 Vitamin D deficiency, unspecified: Secondary | ICD-10-CM | POA: Diagnosis not present

## 2021-02-12 DIAGNOSIS — I4821 Permanent atrial fibrillation: Secondary | ICD-10-CM | POA: Diagnosis not present

## 2021-02-12 LAB — CBC WITH DIFFERENTIAL/PLATELET
Basophils Absolute: 0.1 10*3/uL (ref 0.0–0.1)
Basophils Relative: 0.6 % (ref 0.0–3.0)
Eosinophils Absolute: 0.2 10*3/uL (ref 0.0–0.7)
Eosinophils Relative: 2.5 % (ref 0.0–5.0)
HCT: 43.1 % (ref 36.0–46.0)
Hemoglobin: 14.1 g/dL (ref 12.0–15.0)
Lymphocytes Relative: 13.9 % (ref 12.0–46.0)
Lymphs Abs: 1.1 10*3/uL (ref 0.7–4.0)
MCHC: 32.7 g/dL (ref 30.0–36.0)
MCV: 86.3 fl (ref 78.0–100.0)
Monocytes Absolute: 0.7 10*3/uL (ref 0.1–1.0)
Monocytes Relative: 8.2 % (ref 3.0–12.0)
Neutro Abs: 6.1 10*3/uL (ref 1.4–7.7)
Neutrophils Relative %: 74.8 % (ref 43.0–77.0)
Platelets: 250 10*3/uL (ref 150.0–400.0)
RBC: 5 Mil/uL (ref 3.87–5.11)
RDW: 15.2 % (ref 11.5–15.5)
WBC: 8.1 10*3/uL (ref 4.0–10.5)

## 2021-02-12 LAB — COMPREHENSIVE METABOLIC PANEL
ALT: 16 U/L (ref 0–35)
AST: 17 U/L (ref 0–37)
Albumin: 4.1 g/dL (ref 3.5–5.2)
Alkaline Phosphatase: 76 U/L (ref 39–117)
BUN: 14 mg/dL (ref 6–23)
CO2: 30 mEq/L (ref 19–32)
Calcium: 9.2 mg/dL (ref 8.4–10.5)
Chloride: 101 mEq/L (ref 96–112)
Creatinine, Ser: 0.63 mg/dL (ref 0.40–1.20)
GFR: 86.53 mL/min (ref >60.00)
Glucose, Bld: 112 mg/dL — ABNORMAL HIGH (ref 70–99)
Potassium: 4.2 mEq/L (ref 3.5–5.1)
Sodium: 138 mEq/L (ref 135–145)
Total Bilirubin: 0.4 mg/dL (ref 0.2–1.2)
Total Protein: 7 g/dL (ref 6.0–8.3)

## 2021-02-12 LAB — LIPID PANEL
Cholesterol: 143 mg/dL (ref 0–200)
HDL: 46.4 mg/dL (ref >39.00)
LDL Cholesterol: 81 mg/dL (ref 0–99)
NonHDL: 96.62
Total CHOL/HDL Ratio: 3
Triglycerides: 80 mg/dL (ref 0.0–149.0)
VLDL: 16 mg/dL (ref 0.0–40.0)

## 2021-02-12 LAB — TSH: TSH: 0.86 u[IU]/mL (ref 0.35–5.50)

## 2021-02-12 LAB — HEMOGLOBIN A1C: Hgb A1c MFr Bld: 6.7 % — ABNORMAL HIGH (ref 4.6–6.5)

## 2021-02-12 LAB — VITAMIN D 25 HYDROXY (VIT D DEFICIENCY, FRACTURES): VITD: 46.49 ng/mL (ref 30.00–100.00)

## 2021-02-12 LAB — VITAMIN B12: Vitamin B-12: 281 pg/mL (ref 211–911)

## 2021-02-12 NOTE — Progress Notes (Signed)
Subjective:   Lauren [name] is a 76 y.o. female who presents for Medicare Annual (Subsequent) preventive examination.  I connected with [name] today by telephone and verified that I am speaking with the correct person using two identifiers. Location patient: home Location provider: work Persons participating in the virtual visit: patient, Marine scientist.    I discussed the limitations, risks, security and privacy concerns of performing an evaluation and management service by telephone and the availability of in person appointments. I also discussed with the patient that there may be a patient responsible charge related to this service. The patient expressed understanding and verbally consented to this telephonic visit.    Interactive audio and video telecommunications were attempted between this provider and patient, however failed, due to patient having technical difficulties OR patient did not have access to video capability.  We continued and completed visit with audio only.  Some vital signs may be absent or patient reported.   Time Spent with patient on telephone encounter: 20 minutes  Review of Systems     Cardiac Risk Factors include: advanced age (>49mn, >>58women);dyslipidemia     Objective:    Today's Vitals   02/15/21 1444  Weight: 261 lb (118.4 kg)  Height: '5\' 8"'  (1.727 m)   Body mass index is 39.68 kg/m.  Advanced Directives 02/15/2021 06/13/2020 02/14/2020 01/21/2019 01/11/2018 01/04/2017 12/22/2015  Does Patient Have a Medical Advance Directive? Yes No No Yes Yes Yes Yes  Type of Advance Directive Living will - - [place]  Does patient want to make changes to medical advance directive? Yes (MAU/Ambulatory/Procedural Areas - Information given) - - - - - No - Patient declined  Copy of Healthcare Power of Attorney in  Chart? - - - No - copy requested No - copy requested No - copy requested No - copy requested  Would patient like information on creating a medical advance directive? - No - Patient declined Yes (MAU/Ambulatory/Procedural Areas - Information given) - - - -    Current Medications (verified) Outpatient Encounter Medications as of 02/15/2021  Medication Sig   acetaminophen (TYLENOL) 500 MG tablet Take 1,000 mg by mouth every 6 (six) hours as needed for moderate pain.   apixaban (ELIQUIS) 5 MG TABS tablet Take 1 tablet by mouth twice daily   Carboxymethylcellul-Glycerin (LUBRICATING EYE DROPS OP) Place 1 drop into both eyes 5 (five) times daily.   cetirizine (ZYRTEC) 10 MG tablet Take 10 mg by mouth at bedtime.   DULoxetine (CYMBALTA) 30 MG capsule Take 1 capsule (30 mg total) by mouth daily.   EPINEPHrine 0.3 mg/0.3 mL IJ SOAJ injection Inject 0.3 mLs (0.3 mg total) into the muscle once. (Patient taking differently: Inject 0.3 mg into the muscle as needed for anaphylaxis.)   Evolocumab (REPATHA SURECLICK) 1628MG/ML SOAJ Inject 140 mg into the skin every 14 (fourteen) days.   Fish Oil-Cholecalciferol (OMEGA-3 + VITAMIN D3 PO) Take 2 capsules by mouth daily.    gabapentin (NEURONTIN) 300 MG capsule Take 1 capsule (300 mg total) by mouth 3 (three) times daily. (Patient taking differently: Take 300 mg by mouth 4 (four) times daily.)   metoprolol tartrate (LOPRESSOR) 25 MG tablet Take 1 tablet by mouth twice daily   omeprazole (PRILOSEC) 20 MG capsule Take 20 mg by mouth daily.   oxybutynin (OXYTROL) 3.9 MG/24HR Place 1 patch onto the skin 2 (two) times a week.  pilocarpine (SALAGEN) 5 MG tablet Take 1 tablet (5 mg total) by mouth 3 (three) times daily.   No facility-administered encounter medications on file as of 02/15/2021.    Allergies (verified) Crestor [rosuvastatin], Lipitor [atorvastatin], Statins, and Wasp venom   History: Past Medical History:  Diagnosis Date   Breast cancer ([place]) 2002    DCIS; BRCA 1 pos HER2 pos, ER2 pos   DDD (degenerative disc disease)    DNR (do not resuscitate) 06/13/2020   GERD (gastroesophageal reflux disease)    History of echocardiogram    a. 06/2020 Echo: EF 55-60%, no rwma, nl RV size/fxn. Mildly dil LA.   HLD (hyperlipidemia)    statin intolerant   Hyperglycemia 10/2007   A1C elvated to 6.2   Obstructive sleep apnea    Osteoarthritis    Osteopenia 2007   ? from xray with nl dexa   Persistent atrial fibrillation ([place])    a. Dx 06/2020 in setting of stroke. CHA2DS2VASc = 5-->Eliquis; b. 08/2020 Successful DCCV; c. 09/2020 Office f/u- pt in rate-controlled Afib.   Reactive depression (situational)    very well controlled, and now takes prozac for sleep   Sjogren's syndrome ([place])    Stroke ([place]) 06/2020   Urine incontinence    Vitamin D deficiency    Past Surgical History:  Procedure Laterality Date   BILATERAL OOPHORECTOMY  205   CARDIOVERSION N/A 08/24/2020   Procedure: CARDIOVERSION;  Surgeon: Wellington Hampshire, MD;  Location: [facility] ORS;  Service: Cardiovascular;  Laterality: N/A;   CATARACT EXTRACTION, BILATERAL     10/2019, 12/2019   COLONOSCOPY  5/07   normal; recheck 3 years   [place]   Ruptured disk L5   MASTECTOMY  2001   bilateral   TONSILLECTOMY  1974   TOTAL HIP ARTHROPLASTY  2011   TOTAL KNEE ARTHROPLASTY  2006   bilateral   Family History  Problem Relation Age of Onset   Arthritis Other        family hx   Breast cancer Other        1st degree relative <50   Diabetes Other        1st degree relative   Hyperlipidemia Other        family hx   Hypertension Other        family hx   Ovarian cancer Other        family hx   Prostate cancer Other        1st degree relative <50   Dementia Mother    Psoriasis Brother    Other Daughter        BRCA gene   Brain cancer Brother    Stroke Maternal Grandmother    Social History   Socioeconomic History   Marital status: Widowed    Spouse name: Not on  file   Number of children: 3   Years of education: Not on file   Highest education level: Not on file  Occupational History   Occupation: Retired  Tobacco Use   Smoking status: Never   Smokeless tobacco: Never  Vaping Use   Vaping Use: Never used  Substance and Sexual Activity   Alcohol use: Not Currently    Alcohol/week: 0.0 standard drinks    Comment: rare, none in 2 years   Drug use: No   Sexual activity: Not Currently  Other Topics Concern   Not on file  Social History Narrative   Retired Passenger transport manager degree  Moved from [facility]      Divorced      G3P3      Regular exercise-yes   Social Determinants of Health   Financial Resource Strain: Low Risk    Difficulty of Paying Living Expenses: Not hard at all  Food Insecurity: No Food Insecurity   Worried About Charity fundraiser in the Last Year: Never true   Arboriculturist in the Last Year: Never true  Transportation Needs: No Transportation Needs   Lack of Transportation (Medical): No   Lack of Transportation (Non-Medical): No  Physical Activity: Insufficiently Active   Days of Exercise per Week: 4 days   Minutes of Exercise per Session: 20 min  Stress: No Stress Concern Present   Feeling of Stress : Not at all  Social Connections: Moderately Integrated   Frequency of Communication with Friends and Family: More than three times a week   Frequency of Social Gatherings with Friends and Family: More than three times a week   Attends Religious Services: More than 4 times per year   Active Member of [employer] or Organizations: Yes   Attends Archivist Meetings: More than 4 times per year   Marital Status: Widowed    Tobacco Counseling Counseling given: Not Answered   Clinical Intake:  Pre-visit preparation completed: Yes  Pain : No/denies pain     BMI - recorded: 39.68 Nutritional Status: BMI > 30  Obese Nutritional Risks: None Diabetes: No  How often do you need to have someone help  you when you read instructions, pamphlets, or other written materials from your doctor or pharmacy?: 1 - Never  Diabetic? No  Interpreter Needed?: No  Information entered by :: Orrin Brigham LPN   Activities of Daily Living In your present state of health, do you have any difficulty performing the following activities: 02/15/2021 08/24/2020  Hearing? Y N  Comment patient plans to get hearing aids -  Vision? N N  Difficulty concentrating or making decisions? N N  Walking or climbing stairs? Y N  Dressing or bathing? N N  Doing errands, shopping? N -  Preparing Food and eating ? N -  Using the Toilet? N -  In the past six months, have you accidently leaked urine? Y -  Comment wears protection -  Do you have problems with loss of bowel control? N -  Managing your Medications? N -  Managing your Finances? N -  Housekeeping or managing your Housekeeping? N -  Some recent data might be hidden    Patient Care Team: Tower, Wynelle Fanny, MD as PCP - General Wellington Hampshire, MD as PCP - Cardiology (Cardiology) Sharol Roussel, OD as Physician Assistant (Optometry) Lorelee Cover., MD as Referring Physician (Ophthalmology) Laverle Hobby, MD as Consulting Physician (Pulmonary Disease) Marc Morgans, DMD, PA as Referring Physician (Dentistry)  Indicate any recent Medical Services you may have received from other than [facility] providers in the past year (date may be approximate).     Assessment:   This is a routine wellness examination for Sharae.  Hearing/Vision screen Hearing Screening - Comments:: Decrease in hearing , plans to get hearing aids Vision Screening - Comments:: Last exam 09/2020, Dr. Gloriann Loan, wears glasses for reading  Dietary issues and exercise activities discussed: Current Exercise Habits: Home exercise routine, Type of exercise: walking, Time (Minutes): 15, Frequency (Times/Week): 4, Weekly Exercise (Minutes/Week): 60   Goals Addressed  This Visit's  Progress    Patient Stated       Would like to maintain current routine       Depression Screen PHQ 2/9 Scores 02/15/2021 02/14/2020 01/21/2019 01/11/2018 01/04/2017 12/22/2015 12/24/2014  PHQ - 2 Score 0 0 0 0 0 0 0  PHQ- 9 Score - 0 0 0 0 - -    Fall Risk Fall Risk  02/15/2021 02/14/2020 01/21/2019 12/28/2018 01/11/2018  Falls in the past year? 0 0 0 0 0  Comment - - - Emmi Telephone Survey: data to providers prior to load -  Number falls in past yr: 0 0 0 - -  Injury with Fall? 0 0 0 - -  Risk for fall due to : - No Fall Risks Medication side effect - -  Follow up Falls prevention discussed Falls evaluation completed;Falls prevention discussed Falls evaluation completed;Falls prevention discussed - -    FALL RISK PREVENTION PERTAINING TO THE HOME:  Any stairs in or around the home? No  If so, are there any without handrails? No  Home free of loose throw rugs in walkways, pet beds, electrical cords, etc? Yes  Adequate lighting in your home to reduce risk of falls? Yes   ASSISTIVE DEVICES UTILIZED TO PREVENT FALLS:  Life alert? No  Use of a cane, walker or w/c? No  Grab bars in the bathroom? No  Shower chair or bench in shower? Yes  Elevated toilet seat or a handicapped toilet? Yes   TIMED UP AND GO:  Was the test performed? No .    Cognitive Function: Normal cognitive status assessed by this Nurse Health Advisor. No abnormalities found.   MMSE - Mini Mental State Exam 02/14/2020 01/21/2019 01/11/2018 01/04/2017 12/22/2015  Orientation to time '5 5 5 5 5  ' Orientation to Place '5 5 5 5 5  ' Registration '3 3 3 3 3  ' Attention/ Calculation 5 5 0 0 0  Recall '3 3 3 3 3  ' Language- name 2 objects - - 0 0 0  Language- repeat '1 1 1 1 1  ' Language- follow 3 step command - - '3 3 3  ' Language- read & follow direction - - 0 0 0  Write a sentence - - 0 0 0  Copy design - - 0 0 0  Total score - - '20 20 20        ' Immunizations Immunization History  Administered Date(s) Administered    HPV Bivalent 11/10/2016   Influenza Split 11/26/2010   Influenza Whole 12/06/2009   Influenza, High Dose Seasonal PF 12/31/2019, 11/02/2020   Influenza, Seasonal, Injecte, Preservative Fre 10/23/2015   Influenza,inj,Quad PF,6+ Mos 11/26/2013, 11/10/2014, 11/10/2016, 11/22/2017, 10/30/2018   Influenza-Unspecified 11/26/2013, 11/10/2014, 11/10/2016, 11/22/2017, 10/30/2018   PFIZER(Purple Top)SARS-COV-2 Vaccination 02/28/2019, 03/21/2019, 12/27/2019   Pneumococcal Conjugate-13 12/24/2014, 05/31/2016   Pneumococcal Polysaccharide-23 01/19/2010, 01/08/2018   Td 10/13/2006   Zoster Recombinat (Shingrix) 05/31/2016, 08/23/2016   Zoster, Live 02/07/2005    TDAP status: Due, Education has been provided regarding the importance of this vaccine. Advised may receive this vaccine at local pharmacy or Health Dept. Aware to provide a copy of the vaccination record if obtained from local pharmacy or Health Dept. Verbalized acceptance and understanding.  Flu Vaccine status: Up to date  Pneumococcal vaccine status: Up to date  Covid-19 vaccine status: Information provided on how to obtain vaccines.   Qualifies for Shingles Vaccine? Yes   Zostavax completed Yes   Shingrix Completed?: Yes  Screening Tests Health Maintenance  Topic Date Due   COVID-19 Vaccine (4 - Booster for Pfizer series) 02/21/2020   PAP SMEAR-Modifier  01/05/2024 (Originally 02/14/2012)   Fecal DNA (Cologuard)  04/23/2023   TETANUS/TDAP  07/08/2025   Pneumonia Vaccine 35+ Years old  Completed   INFLUENZA VACCINE  Completed   DEXA SCAN  Completed   Hepatitis C Screening  Completed   Zoster Vaccines- Shingrix  Completed   HPV VACCINES  Aged Out    Health Maintenance  Health Maintenance Due  Topic Date Due   COVID-19 Vaccine (4 - Booster for [place] series) 02/21/2020    Colorectal cancer screening: Type of screening: Cologuard. Completed 04/22/20. Repeat every 3 years  Mammogram status: No longer required due to bilateral  mastectomy.  Bone Density status: Ordered 02/15/21. Pt provided with contact info and advised to call to schedule appt.  Lung Cancer Screening: (Low Dose CT Chest recommended if Age 76-80 years, 30 pack-year currently smoking OR have quit w/in 15years.) does not qualify.     Additional Screening:  Hepatitis C Screening: does qualify; Completed 02/13/18  Vision Screening: Recommended annual ophthalmology exams for early detection of glaucoma and other disorders of the eye. Is the patient up to date with their annual eye exam?  Yes  Who is the provider or what is the name of the office in which the patient attends annual eye exams? Dr. Gloriann Loan  Dental Screening: Recommended annual dental exams for proper oral hygiene  Community Resource Referral / Chronic Care Management: CRR required this visit?  No   CCM required this visit?  No      Plan:     I have personally reviewed and noted the following in the patients chart:   Medical and social history Use of alcohol, tobacco or illicit drugs  Current medications and supplements including opioid prescriptions.  Functional ability and status Nutritional status Physical activity Advanced directives List of other physicians Hospitalizations, surgeries, and ER visits in previous 12 months Vitals Screenings to include cognitive, depression, and falls Referrals and appointments  In addition, I have reviewed and discussed with patient certain preventive protocols, quality metrics, and best practice recommendations. A written personalized care plan for preventive services as well as general preventive health recommendations were provided to patient.   Due to this being a telephonic visit, the after visit summary with patients personalized plan was offered to patient via mail or my-chart. Patient would like to access on my-chart.      Loma Messing, LPN   02/12/1094   Nurse Health Advisor  Nurse Notes: none

## 2021-02-15 ENCOUNTER — Ambulatory Visit (INDEPENDENT_AMBULATORY_CARE_PROVIDER_SITE_OTHER): Payer: Medicare Other

## 2021-02-15 VITALS — Ht 68.0 in | Wt 261.0 lb

## 2021-02-15 DIAGNOSIS — Z780 Asymptomatic menopausal state: Secondary | ICD-10-CM | POA: Diagnosis not present

## 2021-02-15 DIAGNOSIS — Z0000 Encounter for general adult medical examination without abnormal findings: Secondary | ICD-10-CM | POA: Diagnosis not present

## 2021-02-15 NOTE — Patient Instructions (Signed)
[name] , Thank you for taking time to complete your Medicare Wellness Visit. I appreciate your ongoing commitment to your health goals. Please review the following plan we discussed and let me know if I can assist you in the future.   Screening recommendations/referrals: Colonoscopy: up to date, completed 04/22/20, due 04/23/23 Mammogram: no longer needed Bone Density: due, last completed 02/20/17, ordered today someone will call to schedule an appointment Recommended yearly ophthalmology/optometry visit for glaucoma screening and checkup Recommended yearly dental visit for hygiene and checkup  Vaccinations: Influenza vaccine: up to date Pneumococcal vaccine: up to date Tdap vaccine: up to date, please provide vaccine information for your chart Shingles vaccine: up to date    Covid-19:newest booster available at your local pharmacy  Advanced directives: information available at your next appointment  Conditions/risks identified: see problem list  Next appointment: Follow up in one year for your annual wellness visit 02/17/22@ 2:45pm, this will be a telephone visit.    Preventive Care 4 Years and Older, Female Preventive care refers to lifestyle choices and visits with your health care provider that can promote health and wellness. What does preventive care include? A yearly physical exam. This is also called an annual well check. Dental exams once or twice a year. Routine eye exams. Ask your health care provider how often you should have your eyes checked. Personal lifestyle choices, including: Daily care of your teeth and gums. Regular physical activity. Eating a healthy diet. Avoiding tobacco and drug use. Limiting alcohol use. Practicing safe sex. Taking low-dose aspirin every day. Taking vitamin and mineral supplements as recommended by your health care provider. What happens during an annual well check? The services and screenings done by your health care provider during  your annual well check will depend on your age, overall health, lifestyle risk factors, and family history of disease. Counseling  Your health care provider may ask you questions about your: Alcohol use. Tobacco use. Drug use. Emotional well-being. Home and relationship well-being. Sexual activity. Eating habits. History of falls. Memory and ability to understand (cognition). Work and work Statistician. Reproductive health. Screening  You may have the following tests or measurements: Height, weight, and BMI. Blood pressure. Lipid and cholesterol levels. These may be checked every 5 years, or more frequently if you are over 21 years old. Skin check. Lung cancer screening. You may have this screening every year starting at age 42 if you have a 30-pack-year history of smoking and currently smoke or have quit within the past 15 years. Fecal occult blood test (FOBT) of the stool. You may have this test every year starting at age 43. Flexible sigmoidoscopy or colonoscopy. You may have a sigmoidoscopy every 5 years or a colonoscopy every 10 years starting at age 7. Hepatitis C blood test. Hepatitis B blood test. Sexually transmitted disease (STD) testing. Diabetes screening. This is done by checking your blood sugar (glucose) after you have not eaten for a while (fasting). You may have this done every 1-3 years. Bone density scan. This is done to screen for osteoporosis. You may have this done starting at age 24. Mammogram. This may be done every 1-2 years. Talk to your health care provider about how often you should have regular mammograms. Talk with your health care provider about your test results, treatment options, and if necessary, the need for more tests. Vaccines  Your health care provider may recommend certain vaccines, such as: Influenza vaccine. This is recommended every year. Tetanus, diphtheria, and acellular pertussis (  Tdap, Td) vaccine. You may need a Td booster every 10  years. Zoster vaccine. You may need this after age 42. Pneumococcal 13-valent conjugate (PCV13) vaccine. One dose is recommended after age 27. Pneumococcal polysaccharide (PPSV23) vaccine. One dose is recommended after age 84. Talk to your health care provider about which screenings and vaccines you need and how often you need them. This information is not intended to replace advice given to you by your health care provider. Make sure you discuss any questions you have with your health care provider. Document Released: 02/20/2015 Document Revised: 10/14/2015 Document Reviewed: 11/25/2014 Elsevier Interactive Patient Education  2017 [employer] Prevention in the Home Falls can cause injuries. They can happen to people of all ages. There are many things you can do to make your home safe and to help prevent falls. What can I do on the outside of my home? Regularly fix the edges of walkways and driveways and fix any cracks. Remove anything that might make you trip as you walk through a door, such as a raised step or threshold. Trim any bushes or trees on the path to your home. Use bright outdoor lighting. Clear any walking paths of anything that might make someone trip, such as rocks or tools. Regularly check to see if handrails are loose or broken. Make sure that both sides of any steps have handrails. Any raised decks and porches should have guardrails on the edges. Have any leaves, snow, or ice cleared regularly. Use sand or salt on walking paths during winter. Clean up any spills in your garage right away. This includes oil or grease spills. What can I do in the bathroom? Use night lights. Install grab bars by the toilet and in the tub and shower. Do not use towel bars as grab bars. Use non-skid mats or decals in the tub or shower. If you need to sit down in the shower, use a plastic, non-slip stool. Keep the floor dry. Clean up any water that spills on the floor as soon as it  happens. Remove soap buildup in the tub or shower regularly. Attach bath mats securely with double-sided non-slip rug tape. Do not have throw rugs and other things on the floor that can make you trip. What can I do in the bedroom? Use night lights. Make sure that you have a light by your bed that is easy to reach. Do not use any sheets or blankets that are too big for your bed. They should not hang down onto the floor. Have a firm chair that has side arms. You can use this for support while you get dressed. Do not have throw rugs and other things on the floor that can make you trip. What can I do in the kitchen? Clean up any spills right away. Avoid walking on wet floors. Keep items that you use a lot in easy-to-reach places. If you need to reach something above you, use a strong step stool that has a grab bar. Keep electrical cords out of the way. Do not use floor polish or wax that makes floors slippery. If you must use wax, use non-skid floor wax. Do not have throw rugs and other things on the floor that can make you trip. What can I do with my stairs? Do not leave any items on the stairs. Make sure that there are handrails on both sides of the stairs and use them. Fix handrails that are broken or loose. Make sure that handrails are  as long as the stairways. Check any carpeting to make sure that it is firmly attached to the stairs. Fix any carpet that is loose or worn. Avoid having throw rugs at the top or bottom of the stairs. If you do have throw rugs, attach them to the floor with carpet tape. Make sure that you have a light switch at the top of the stairs and the bottom of the stairs. If you do not have them, ask someone to add them for you. What else can I do to help prevent falls? Wear shoes that: Do not have high heels. Have rubber bottoms. Are comfortable and fit you well. Are closed at the toe. Do not wear sandals. If you use a stepladder: Make sure that it is fully opened.  Do not climb a closed stepladder. Make sure that both sides of the stepladder are locked into place. Ask someone to hold it for you, if possible. Clearly mark and make sure that you can see: Any grab bars or handrails. First and last steps. Where the edge of each step is. Use tools that help you move around (mobility aids) if they are needed. These include: Canes. Walkers. Scooters. Crutches. Turn on the lights when you go into a dark area. Replace any light bulbs as soon as they burn out. Set up your furniture so you have a clear path. Avoid moving your furniture around. If any of your floors are uneven, fix them. If there are any pets around you, be aware of where they are. Review your medicines with your doctor. Some medicines can make you feel dizzy. This can increase your chance of falling. Ask your doctor what other things that you can do to help prevent falls. This information is not intended to replace advice given to you by your health care provider. Make sure you discuss any questions you have with your health care provider. Document Released: 11/20/2008 Document Revised: 07/02/2015 Document Reviewed: 02/28/2014 Elsevier Interactive Patient Education  2017 [employer].

## 2021-02-16 ENCOUNTER — Encounter: Payer: Medicare Other | Admitting: Speech Pathology

## 2021-02-17 DIAGNOSIS — G5602 Carpal tunnel syndrome, left upper limb: Secondary | ICD-10-CM | POA: Diagnosis not present

## 2021-02-18 ENCOUNTER — Encounter: Payer: Medicare Other | Admitting: Speech Pathology

## 2021-02-19 ENCOUNTER — Other Ambulatory Visit: Payer: Self-pay

## 2021-02-19 ENCOUNTER — Encounter: Payer: Self-pay | Admitting: Family Medicine

## 2021-02-19 ENCOUNTER — Ambulatory Visit (INDEPENDENT_AMBULATORY_CARE_PROVIDER_SITE_OTHER): Payer: Medicare Other | Admitting: Family Medicine

## 2021-02-19 VITALS — BP 132/80 | HR 85 | Temp 97.6°F | Ht 67.5 in | Wt 259.5 lb

## 2021-02-19 DIAGNOSIS — I4821 Permanent atrial fibrillation: Secondary | ICD-10-CM

## 2021-02-19 DIAGNOSIS — R7303 Prediabetes: Secondary | ICD-10-CM

## 2021-02-19 DIAGNOSIS — Z853 Personal history of malignant neoplasm of breast: Secondary | ICD-10-CM | POA: Diagnosis not present

## 2021-02-19 DIAGNOSIS — K219 Gastro-esophageal reflux disease without esophagitis: Secondary | ICD-10-CM

## 2021-02-19 DIAGNOSIS — M7918 Myalgia, other site: Secondary | ICD-10-CM

## 2021-02-19 DIAGNOSIS — E559 Vitamin D deficiency, unspecified: Secondary | ICD-10-CM

## 2021-02-19 DIAGNOSIS — E7800 Pure hypercholesterolemia, unspecified: Secondary | ICD-10-CM

## 2021-02-19 DIAGNOSIS — G4733 Obstructive sleep apnea (adult) (pediatric): Secondary | ICD-10-CM

## 2021-02-19 DIAGNOSIS — M3500 Sicca syndrome, unspecified: Secondary | ICD-10-CM

## 2021-02-19 MED ORDER — PILOCARPINE HCL 5 MG PO TABS: 5.0000 mg | ORAL_TABLET | Freq: Three times a day (TID) | ORAL | 3 refills | Status: DC

## 2021-02-19 MED ORDER — GABAPENTIN 300 MG PO CAPS
ORAL_CAPSULE | ORAL | 3 refills | Status: DC
Start: 2021-02-19 — End: 2022-03-28

## 2021-02-19 MED ORDER — OXYBUTYNIN 3.9 MG/24HR TD PTTW: 1.0000 | MEDICATED_PATCH | TRANSDERMAL | 3 refills | Status: DC

## 2021-02-19 MED ORDER — DULOXETINE HCL 30 MG PO CPEP: 30.0000 mg | ORAL_CAPSULE | Freq: Every day | ORAL | 3 refills | Status: DC

## 2021-02-19 NOTE — Patient Instructions (Addendum)
I suspect you should re start zetia back- check with cardiology to be sure   Follow up in 3 months for visit (will check a1c at that visit)   Take care of yourself   Keep working on diet and exercise  Try to get most of your carbohydrates from produce (with the exception of white potatoes)  Eat less bread/pasta/rice/snack foods/cereals/sweets and other items from the middle of the grocery store (processed carbs)

## 2021-02-19 NOTE — Progress Notes (Signed)
° °Subjective:  ° ° Patient ID: Lauren [name], female    DOB: 11/03/1945, 75 y.o.   MRN: [id number] ° °This visit occurred during the SARS-CoV-2 public health emergency.  Safety protocols were in place, including screening questions prior to the visit, additional usage of staff PPE, and extensive cleaning of exam room while observing appropriate contact time as indicated for disinfecting solutions.  ° °HPI °Pt presents for annual f/u of chronic health problems  ° °Wt Readings from Last 3 Encounters:  °02/19/21 259 lb 8 oz (117.7 kg)  °02/15/21 261 lb (118.4 kg)  °12/22/20 261 lb 8 oz (118.6 kg)  ° °40.04 kg/m² ° °Since xmas-diet has not been as good °Now off sugar  °Some intermitting fasting since dec 29th (tolerates well) now is getting more protein  ° °Had amw on 1/9 -reviewed today ° °Bilateral mastectomy-no mammogram needed  °H/o breast cancer in the past ° °Colon cancer screening-cologuard  negative 04/2020 ° °Dexa 01/2018-normal BMD °Falls -none °Fractures-none  °Supplements-vitamin D  °H/o low D level   now 46.4  °Exercise -walking 15 minutes-now increasing since xmas ° °H/o a fib with cva in past °Now f/u every 6 months  °Under care of cardiology °On eliquis and metoprolol  ° °BP Readings from Last 3 Encounters:  °02/19/21 132/80  °12/22/20 118/84  °09/29/20 136/88  ° °Pulse Readings from Last 3 Encounters:  °02/19/21 85  °12/22/20 91  °12/08/20 86  ° °OSA-uses cpap °Lab Results  °Component Value Date  ° CREATININE 0.63 02/12/2021  ° BUN 14 02/12/2021  ° NA 138 02/12/2021  ° K 4.2 02/12/2021  ° CL 101 02/12/2021  ° CO2 30 02/12/2021  ° ° ° °Hyperlipidemia °Lab Results  °Component Value Date  ° CHOL 143 02/12/2021  ° CHOL 111 10/21/2020  ° CHOL 177 08/05/2020  ° °Lab Results  °Component Value Date  ° HDL 46.40 02/12/2021  ° HDL 46 10/21/2020  ° HDL 46 08/05/2020  ° °Lab Results  °Component Value Date  ° LDLCALC 81 02/12/2021  ° LDLCALC 51 10/21/2020  ° LDLCALC 114 (H) 08/05/2020  ° °Lab Results  °Component  Value Date  ° TRIG 80.0 02/12/2021  ° TRIG 71 10/21/2020  ° TRIG 83 08/05/2020  ° °Lab Results  °Component Value Date  ° CHOLHDL 3 02/12/2021  ° CHOLHDL 2.4 10/21/2020  ° CHOLHDL 3.8 08/05/2020  ° °Lab Results  °Component Value Date  ° LDLDIRECT 148.9 08/20/2012  ° LDLDIRECT 184.7 02/11/2009  °Controlled with Repatha °Was on zetia - then came off of it (no side effects however)  °LDL of 81 now, above goal  ° °Prediabetes °Lab Results  °Component Value Date  ° HGBA1C 6.7 (H) 02/12/2021  °This is up from 6.1 °Ate a lot of sugar at christmas  ° °H/o sjogren's and myofascial pain= sees rheumatology °Went up to 2 gabapentin instead of 1  °Cymbalta helps also  ° ° °Lab Results  °Component Value Date  ° WBC 8.1 02/12/2021  ° HGB 14.1 02/12/2021  ° HCT 43.1 02/12/2021  ° MCV 86.3 02/12/2021  ° PLT 250.0 02/12/2021  ° °Lab Results  °Component Value Date  ° ALT 16 02/12/2021  ° AST 17 02/12/2021  ° ALKPHOS 76 02/12/2021  ° BILITOT 0.4 02/12/2021  ° °Lab Results  °Component Value Date  ° TSH 0.86 02/12/2021  °  °Takes omeprazole for GERD °Lab Results  °Component Value Date  ° VITAMINB12 281 02/12/2021  ° °Patient Active Problem List  ° Diagnosis Date   Date Noted   Acute cough 12/08/2020   Atrial fibrillation ([place]) 09/29/2020   Myofascial pain 09/29/2020   History of CVA (cerebrovascular accident) 06/13/2020   DNR (do not resuscitate) 06/13/2020   Elevated blood pressure reading 01/28/2020   Dermatochalasis of both upper eyelids 07/15/2019   Ptosis of both upper eyelids 07/15/2019   Carpal tunnel syndrome 01/25/2019   Age-related nuclear cataract, bilateral 06/19/2018   Vitamin B12 deficiency 03/27/2018   Sjogren's disease ([place]) 01/15/2018   Osteoarthritis 01/15/2018   Obesity (BMI 30-39.9) 01/15/2018   H/O herpes zoster 11/22/2017   Estrogen deficiency 01/09/2017   Routine general medical examination at a health care facility 01/01/2017   OSA (obstructive sleep apnea) 09/18/2016   Colon cancer screening 12/24/2014    Bee sting allergy 06/10/2014   Morbid obesity ([place]) 06/10/2014   Encounter for Medicare annual wellness exam 12/03/2013   History of shingles 11/27/2013   Hx of breast cancer 08/22/2011   Gynecological examination 02/14/2011   Bilateral dry eyes 01/20/2011   Dry eye syndrome of bilateral lacrimal glands 01/20/2011   Vitamin D deficiency 04/22/2008   Hyperlipidemia 04/22/2008   GERD 04/22/2008   Prediabetes 04/22/2008   Past Medical History:  Diagnosis Date   Breast cancer ([place]) 2002   DCIS; BRCA 1 pos HER2 pos, ER2 pos   DDD (degenerative disc disease)    DNR (do not resuscitate) 06/13/2020   GERD (gastroesophageal reflux disease)    History of echocardiogram    a. 06/2020 Echo: EF 55-60%, no rwma, nl RV size/fxn. Mildly dil LA.   HLD (hyperlipidemia)    statin intolerant   Hyperglycemia 10/2007   A1C elvated to 6.2   Obstructive sleep apnea    Osteoarthritis    Osteopenia 2007   ? from xray with nl dexa   Persistent atrial fibrillation ([place])    a. Dx 06/2020 in setting of stroke. CHA2DS2VASc = 5-->Eliquis; b. 08/2020 Successful DCCV; c. 09/2020 Office f/u- pt in rate-controlled Afib.   Reactive depression (situational)    very well controlled, and now takes prozac for sleep   Sjogren's syndrome ([place])    Stroke ([place]) 06/2020   Urine incontinence    Vitamin D deficiency    Past Surgical History:  Procedure Laterality Date   BILATERAL OOPHORECTOMY  205   CARDIOVERSION N/A 08/24/2020   Procedure: CARDIOVERSION;  Surgeon: Wellington Hampshire, MD;  Location: [facility] ORS;  Service: Cardiovascular;  Laterality: N/A;   CATARACT EXTRACTION, BILATERAL     10/2019, 12/2019   COLONOSCOPY  5/07   normal; recheck 3 years   [place]   Ruptured disk L5   MASTECTOMY  2001   bilateral   TONSILLECTOMY  1974   TOTAL HIP ARTHROPLASTY  2011   TOTAL KNEE ARTHROPLASTY  2006   bilateral   Social History   Tobacco Use   Smoking status: Never   Smokeless tobacco: Never   Vaping Use   Vaping Use: Never used  Substance Use Topics   Alcohol use: Not Currently    Alcohol/week: 0.0 standard drinks    Comment: rare, none in 2 years   Drug use: No   Family History  Problem Relation Age of Onset   Arthritis Other        family hx   Breast cancer Other        1st degree relative <50   Diabetes Other        1st degree relative   Hyperlipidemia Other  family hx  ° Hypertension Other   °     family hx  ° Ovarian cancer Other   °     family hx  ° Prostate cancer Other   °     1st degree relative <50  ° Dementia Mother   ° Psoriasis Brother   ° Other Daughter   °     BRCA gene  ° Brain cancer Brother   ° Stroke Maternal Grandmother   ° °Allergies  °Allergen Reactions  ° Crestor [Rosuvastatin]   °  myalgias  ° Lipitor [Atorvastatin]   °  myalgias  ° Statins   °  joint pain  ° Wasp Venom Swelling  ° °Current Outpatient Medications on File Prior to Visit  °Medication Sig Dispense Refill  ° acetaminophen (TYLENOL) 500 MG tablet Take 1,000 mg by mouth every 6 (six) hours as needed for moderate pain.    ° apixaban (ELIQUIS) 5 MG TABS tablet Take 1 tablet by mouth twice daily 180 tablet 1  ° Carboxymethylcellul-Glycerin (LUBRICATING EYE DROPS OP) Place 1 drop into both eyes 5 (five) times daily.    ° cetirizine (ZYRTEC) 10 MG tablet Take 10 mg by mouth at bedtime.    ° EPINEPHrine 0.3 mg/0.3 mL IJ SOAJ injection Inject 0.3 mLs (0.3 mg total) into the muscle once. (Patient taking differently: Inject 0.3 mg into the muscle as needed for anaphylaxis.) 1 Device 5  ° Evolocumab (REPATHA SURECLICK) 140 MG/ML SOAJ Inject 140 mg into the skin every 14 (fourteen) days. 2 mL 11  ° Fish Oil-Cholecalciferol (OMEGA-3 + VITAMIN D3 PO) Take 2 capsules by mouth daily.     ° metoprolol tartrate (LOPRESSOR) 25 MG tablet Take 1 tablet by mouth twice daily 180 tablet 2  ° omeprazole (PRILOSEC) 20 MG capsule Take 20 mg by mouth daily.    ° °No current facility-administered medications on file prior  to visit.  °  °Review of Systems  °Constitutional:  Negative for activity change, appetite change, fatigue, fever and unexpected weight change.  °HENT:  Negative for congestion, ear pain, rhinorrhea, sinus pressure and sore throat.   °     Dry mouth  °Eyes:  Negative for pain, redness and visual disturbance.  °Respiratory:  Negative for cough, shortness of breath and wheezing.   °Cardiovascular:  Negative for chest pain and palpitations.  °Gastrointestinal:  Negative for abdominal pain, blood in stool, constipation and diarrhea.  °Endocrine: Negative for polydipsia and polyuria.  °Genitourinary:  Negative for dysuria, frequency and urgency.  °Musculoskeletal:  Positive for arthralgias. Negative for back pain and myalgias.  °Skin:  Negative for pallor and rash.  °Allergic/Immunologic: Negative for environmental allergies.  °Neurological:  Negative for dizziness, syncope and headaches.  °Hematological:  Negative for adenopathy. Does not bruise/bleed easily.  °Psychiatric/Behavioral:  Negative for decreased concentration and dysphoric mood. The patient is not nervous/anxious.   ° °   °Objective:  ° Physical Exam °Constitutional:   °   General: She is not in acute distress. °   Appearance: Normal appearance. She is well-developed. She is obese. She is not ill-appearing or diaphoretic.  °HENT:  °   Head: Normocephalic and atraumatic.  °   Right Ear: Tympanic membrane, ear canal and external ear normal.  °   Left Ear: Tympanic membrane, ear canal and external ear normal.  °   Nose: Nose normal. No congestion.  °   Mouth/Throat:  °   Mouth: Mucous membranes are moist.  °   Pharynx:   Oropharynx is clear. No posterior oropharyngeal erythema.  Eyes:     General: No scleral icterus.    Extraocular Movements: Extraocular movements intact.     Conjunctiva/sclera: Conjunctivae normal.     Pupils: Pupils are equal, round, and reactive to light.  Neck:     Thyroid: No thyromegaly.     Vascular: No carotid bruit or JVD.   Cardiovascular:     Rate and Rhythm: Normal rate. Rhythm irregular.     Pulses: Normal pulses.     Heart sounds: Normal heart sounds.    No gallop.  Pulmonary:     Effort: Pulmonary effort is normal. No respiratory distress.     Breath sounds: Normal breath sounds. No wheezing.     Comments: Good air exch Chest:     Chest wall: No tenderness.  Abdominal:     General: Bowel sounds are normal. There is no distension or abdominal bruit.     Palpations: Abdomen is soft. There is no mass.     Tenderness: There is no abdominal tenderness.     Hernia: No hernia is present.  Genitourinary:    Comments: S/p bilateral mastectomy No change in axillary exam    Musculoskeletal:        General: No tenderness. Normal range of motion.     Cervical back: Normal range of motion and neck supple. No rigidity. No muscular tenderness.     Right lower leg: No edema.     Left lower leg: No edema.  Lymphadenopathy:     Cervical: No cervical adenopathy.  Skin:    General: Skin is warm and dry.     Coloration: Skin is not pale.     Findings: No erythema or rash.     Comments: Solar lentigines diffusely   Neurological:     Mental Status: She is alert. Mental status is at baseline.     Cranial Nerves: No cranial nerve deficit.     Motor: No abnormal muscle tone.     Coordination: Coordination normal.     Gait: Gait normal.     Deep Tendon Reflexes: Reflexes are normal and symmetric.  Psychiatric:        Mood and Affect: Mood normal.          Assessment & Plan:   Problem List Items Addressed This Visit       Cardiovascular and Mediastinum   Atrial fibrillation ([place])    Doing better -continues metoprolol 25 mg bid and eliquis 5 mg  Seeing cardiology  No symptoms Labs reviewed          Respiratory   OSA (obstructive sleep apnea)    Encouraged pt to continue using cpap  She is currently working on weight loss        Digestive   GERD    Unable to tolerate without  omeprazole          Other   Vitamin D deficiency    Vitamin D level is therapeutic with current supplementation Disc importance of this to bone and overall health Level of 46.4      Hyperlipidemia    Taking repatha and off zetia LDL is 81 -not at goal May benefit from addition of zetia- she will call cardiologist to confirm however      Prediabetes - Primary    Lab Results  Component Value Date   HGBA1C 6.7 (H) 02/12/2021  This is over 6.5 now disc imp of low glycemic diet and wt loss  F/u 3 mo for visit and re  °Consider metformin if not improved °  °  ° Hx of breast cancer  °  S/p mastectomy °Nl axillary exam °  °  ° Morbid obesity ([facility])  °  Discussed how this problem influences overall health and the risks it imposes  °Reviewed plan for weight loss with lower calorie diet (via better food choices and also portion control or program like weight watchers) and exercise building up to or more than 30 minutes 5 days per week including some aerobic activity  ° ° °  °  ° Sjogren's disease (H[facility])  °  Continues treatment from rheumatology °doinb better with extra gabapentin at night for arthralgias and myalgias °Salagen for dry mouth °  °  ° ° ° °

## 2021-02-19 NOTE — Assessment & Plan Note (Signed)
Unable to tolerate without omeprazole

## 2021-02-19 NOTE — Assessment & Plan Note (Signed)
Doing better -continues metoprolol 25 mg bid and eliquis 5 mg  Seeing cardiology  No symptoms Labs reviewed

## 2021-02-19 NOTE — Assessment & Plan Note (Signed)
Encouraged pt to continue using cpap  She is currently working on weight loss

## 2021-02-21 NOTE — Assessment & Plan Note (Signed)
S/p mastectomy Nl axillary exam

## 2021-02-21 NOTE — Assessment & Plan Note (Signed)
Discussed how this problem influences overall health and the risks it imposes  Reviewed plan for weight loss with lower calorie diet (via better food choices and also portion control or program like weight watchers) and exercise building up to or more than 30 minutes 5 days per week including some aerobic activity    

## 2021-02-21 NOTE — Assessment & Plan Note (Signed)
Taking repatha and off zetia LDL is 81 -not at goal May benefit from addition of zetia- she will call cardiologist to confirm however

## 2021-02-21 NOTE — Assessment & Plan Note (Signed)
Vitamin D level is therapeutic with current supplementation Disc importance of this to bone and overall health Level of 46.4

## 2021-02-21 NOTE — Assessment & Plan Note (Signed)
Lab Results  Component Value Date   HGBA1C 6.7 (H) 02/12/2021   This is over 6.5 now disc imp of low glycemic diet and wt loss  F/u 3 mo for visit and re  Consider metformin if not improved

## 2021-02-21 NOTE — Assessment & Plan Note (Signed)
Continues treatment from rheumatology doinb better with extra gabapentin at night for arthralgias and myalgias Salagen for dry mouth

## 2021-02-23 ENCOUNTER — Ambulatory Visit: Payer: Medicare Other | Admitting: Pulmonary Disease

## 2021-02-23 ENCOUNTER — Encounter: Payer: Medicare Other | Admitting: Speech Pathology

## 2021-02-24 DIAGNOSIS — G5601 Carpal tunnel syndrome, right upper limb: Secondary | ICD-10-CM | POA: Diagnosis not present

## 2021-02-25 ENCOUNTER — Encounter: Payer: Medicare Other | Admitting: Speech Pathology

## 2021-03-02 ENCOUNTER — Encounter: Payer: Medicare Other | Admitting: Speech Pathology

## 2021-03-03 DIAGNOSIS — U071 COVID-19: Secondary | ICD-10-CM | POA: Diagnosis not present

## 2021-03-04 ENCOUNTER — Encounter: Payer: Medicare Other | Admitting: Speech Pathology

## 2021-03-05 ENCOUNTER — Encounter: Payer: Self-pay | Admitting: Emergency Medicine

## 2021-03-05 ENCOUNTER — Ambulatory Visit
Admission: EM | Admit: 2021-03-05 | Discharge: 2021-03-05 | Disposition: A | Discharge status: Home / Self Care | Payer: Medicare Other | Attending: Emergency Medicine | Admitting: Emergency Medicine

## 2021-03-05 ENCOUNTER — Other Ambulatory Visit: Payer: Self-pay

## 2021-03-05 DIAGNOSIS — R1032 Left lower quadrant pain: Secondary | ICD-10-CM

## 2021-03-05 DIAGNOSIS — R197 Diarrhea, unspecified: Secondary | ICD-10-CM

## 2021-03-05 DIAGNOSIS — K921 Melena: Secondary | ICD-10-CM

## 2021-03-05 LAB — POC HEMOCCULT BLD/STL (OFFICE/1-CARD/DIAGNOSTIC): Fecal Occult Blood, POC: POSITIVE — AB

## 2021-03-05 MED ORDER — AZITHROMYCIN 250 MG PO TABS: 1000.0000 mg | ORAL_TABLET | Freq: Once | ORAL | 0 refills | Status: AC

## 2021-03-05 NOTE — Discharge Instructions (Addendum)
Go to the emergency department if you have acute abdominal pain, worsening diarrhea, feeling of dehydration, or other concerning symptoms.    Take the Zithromax as directed.  Your blood work is pending.  Follow up with your primary care provider on Monday.

## 2021-03-05 NOTE — ED Triage Notes (Signed)
Pt states she ate at a buffet 12 days ago and 11 days ago she started to have liquid diarrhea and joint pain. Pt started with bright red bloody stools a week ago mixed with mucous. Pt states she feels terrible and feels like she looks pale.

## 2021-03-05 NOTE — ED Provider Notes (Signed)
Lauren [name]    CSN: [id number] Arrival date & time: 03/05/21  0851      History   Chief Complaint Chief Complaint  Patient presents with   Diarrhea    HPI Lauren [name] is a 76 y.o. female.  Patient presents with 11 day history of diarrhea. This started after eating salad at a buffet.  She reports >6 episodes of diarrhea daily; last occurred at 0200 today.  Stool has bright red blood and mucous.  She also report LLQ abdominal pain "cramping" with diarrhea.  Treatment attempted with Imodium.  She denies fever, nausea, vomiting, dysuria, hematuria, or other symptoms.  Her medical history includes atrial fibrillation, stroke, breast cancer, Sjogren's syndrome, GERD, prediabetes, morbid obesity.  Patient reports history of diverticulosis.   The history is provided by the patient and medical records.   Past Medical History:  Diagnosis Date   Breast cancer ([place]) 2002   DCIS; BRCA 1 pos HER2 pos, ER2 pos   DDD (degenerative disc disease)    DNR (do not resuscitate) 06/13/2020   GERD (gastroesophageal reflux disease)    History of echocardiogram    a. 06/2020 Echo: EF 55-60%, no rwma, nl RV size/fxn. Mildly dil LA.   HLD (hyperlipidemia)    statin intolerant   Hyperglycemia 10/2007   A1C elvated to 6.2   Obstructive sleep apnea    Osteoarthritis    Osteopenia 2007   ? from xray with nl dexa   Persistent atrial fibrillation ([place])    a. Dx 06/2020 in setting of stroke. CHA2DS2VASc = 5-->Eliquis; b. 08/2020 Successful DCCV; c. 09/2020 Office f/u- pt in rate-controlled Afib.   Reactive depression (situational)    very well controlled, and now takes prozac for sleep   Sjogren's syndrome ([place])    Stroke ([place]) 06/2020   Urine incontinence    Vitamin D deficiency     Patient Active Problem List   Diagnosis Date Noted   Acute cough 12/08/2020   Atrial fibrillation ([place]) 09/29/2020   Myofascial pain 09/29/2020   History of CVA (cerebrovascular accident) 06/13/2020   DNR (do  not resuscitate) 06/13/2020   Elevated blood pressure reading 01/28/2020   Dermatochalasis of both upper eyelids 07/15/2019   Ptosis of both upper eyelids 07/15/2019   Carpal tunnel syndrome 01/25/2019   Age-related nuclear cataract, bilateral 06/19/2018   Vitamin B12 deficiency 03/27/2018   Sjogren's disease ([place]) 01/15/2018   Osteoarthritis 01/15/2018   Obesity (BMI 30-39.9) 01/15/2018   H/O herpes zoster 11/22/2017   Estrogen deficiency 01/09/2017   Routine general medical examination at a health care facility 01/01/2017   OSA (obstructive sleep apnea) 09/18/2016   Colon cancer screening 12/24/2014   Bee sting allergy 06/10/2014   Morbid obesity ([place]) 06/10/2014   Encounter for Medicare annual wellness exam 12/03/2013   History of shingles 11/27/2013   Hx of breast cancer 08/22/2011   Gynecological examination 02/14/2011   Bilateral dry eyes 01/20/2011   Dry eye syndrome of bilateral lacrimal glands 01/20/2011   Vitamin D deficiency 04/22/2008   Hyperlipidemia 04/22/2008   GERD 04/22/2008   Prediabetes 04/22/2008    Past Surgical History:  Procedure Laterality Date   BILATERAL OOPHORECTOMY  205   CARDIOVERSION N/A 08/24/2020   Procedure: CARDIOVERSION;  Surgeon: Wellington Hampshire, MD;  Location: [facility] ORS;  Service: Cardiovascular;  Laterality: N/A;   CATARACT EXTRACTION, BILATERAL     10/2019, 12/2019   COLONOSCOPY  5/07   normal; recheck 3 years   [place]  1991   Ruptured disk L5   MASTECTOMY  2001   bilateral   TONSILLECTOMY  1974   TOTAL HIP ARTHROPLASTY  2011   TOTAL KNEE ARTHROPLASTY  2006   bilateral    OB History   No obstetric history on file.      Home Medications    Prior to Admission medications   Medication Sig Start Date End Date Taking? Authorizing Provider  azithromycin (ZITHROMAX) 250 MG tablet Take 4 tablets (1,000 mg total) by mouth once for 1 dose. Take first 2 tablets together, then 1 every day until finished. 03/05/21 03/05/21  Yes Sharion Balloon, NP  acetaminophen (TYLENOL) 500 MG tablet Take 1,000 mg by mouth every 6 (six) hours as needed for moderate pain.    [provider]  apixaban (ELIQUIS) 5 MG TABS tablet Take 1 tablet by mouth twice daily 12/28/20   Wellington Hampshire, MD  Carboxymethylcellul-Glycerin (LUBRICATING EYE DROPS OP) Place 1 drop into both eyes 5 (five) times daily.    [provider]  cetirizine (ZYRTEC) 10 MG tablet Take 10 mg by mouth at bedtime.    [provider]  DULoxetine (CYMBALTA) 30 MG capsule Take 1 capsule (30 mg total) by mouth daily. 02/19/21   Tower, Wynelle Fanny, MD  EPINEPHrine 0.3 mg/0.3 mL IJ SOAJ injection Inject 0.3 mLs (0.3 mg total) into the muscle once. Patient taking differently: Inject 0.3 mg into the muscle as needed for anaphylaxis. 06/10/14   Tower, Wynelle Fanny, MD  Evolocumab (REPATHA SURECLICK) 655 MG/ML SOAJ Inject 140 mg into the skin every 14 (fourteen) days. 08/27/20   Wellington Hampshire, MD  Fish Oil-Cholecalciferol (OMEGA-3 + VITAMIN D3 PO) Take 2 capsules by mouth daily.     [provider]  gabapentin (NEURONTIN) 300 MG capsule Take one by mouth in the am, one mid day and 2 at bedtime 02/19/21   Tower, Wynelle Fanny, MD  metoprolol tartrate (LOPRESSOR) 25 MG tablet Take 1 tablet by mouth twice daily 12/25/20   Wellington Hampshire, MD  omeprazole (PRILOSEC) 20 MG capsule Take 20 mg by mouth daily.    [provider]  oxybutynin (OXYTROL) 3.9 MG/24HR Place 1 patch onto the skin 2 (two) times a week. 02/22/21   Tower, Wynelle Fanny, MD  pilocarpine (SALAGEN) 5 MG tablet Take 1 tablet (5 mg total) by mouth 3 (three) times daily. 02/19/21   Tower, Wynelle Fanny, MD    Family History Family History  Problem Relation Age of Onset   Arthritis Other        family hx   Breast cancer Other        1st degree relative <50   Diabetes Other        1st degree relative   Hyperlipidemia Other        family hx   Hypertension Other        family hx   Ovarian  cancer Other        family hx   Prostate cancer Other        1st degree relative <50   Dementia Mother    Psoriasis Brother    Other Daughter        BRCA gene   Brain cancer Brother    Stroke Maternal Grandmother     Social History Social History   Tobacco Use   Smoking status: Never   Smokeless tobacco: Never  Vaping Use   Vaping Use: Never used  Substance Use Topics  Alcohol use: Not Currently    Alcohol/week: 0.0 standard drinks    Comment: rare, none in 2 years   Drug use: No     Allergies   Crestor [rosuvastatin], Lipitor [atorvastatin], Statins, and Wasp venom   Review of Systems Review of Systems  Constitutional:  Negative for chills and fever.  Respiratory:  Negative for cough and shortness of breath.   Cardiovascular:  Negative for chest pain and palpitations.  Gastrointestinal:  Positive for abdominal pain, blood in stool and diarrhea. Negative for nausea and vomiting.  Genitourinary:  Negative for dysuria and hematuria.  All other systems reviewed and are negative.   Physical Exam Triage Vital Signs ED Triage Vitals  Enc Vitals Group     BP 03/05/21 0915 123/82     Pulse Rate 03/05/21 0915 96     Resp 03/05/21 0915 18     Temp 03/05/21 0915 98.9 F (37.2 C)     Temp Source 03/05/21 0915 Oral     SpO2 03/05/21 0915 97 %     Weight --      Height --      Head Circumference --      Peak Flow --      Pain Score 03/05/21 0927 3     Pain Loc --      Pain Edu? --      Excl. in [place]? --    No data found.  Updated Vital Signs BP 123/82 (BP Location: Left Arm)    Pulse 96    Temp 98.9 F (37.2 C) (Oral)    Resp 18    SpO2 97%   Visual Acuity Right Eye Distance:   Left Eye Distance:   Bilateral Distance:    Right Eye Near:   Left Eye Near:    Bilateral Near:     Physical Exam Vitals and nursing note reviewed.  Constitutional:      General: She is not in acute distress.    Appearance: She is well-developed. She is obese.  HENT:      Mouth/Throat:     Mouth: Mucous membranes are dry.     Pharynx: Oropharynx is clear.  Cardiovascular:     Rate and Rhythm: Normal rate and regular rhythm.     Heart sounds: Normal heart sounds.  Pulmonary:     Effort: Pulmonary effort is normal. No respiratory distress.     Breath sounds: Normal breath sounds.  Abdominal:     General: Bowel sounds are normal. There is no distension.     Palpations: Abdomen is soft.     Tenderness: There is no abdominal tenderness. There is no guarding or rebound.  Musculoskeletal:     Cervical back: Neck supple.  Skin:    General: Skin is warm and dry.  Neurological:     Mental Status: She is alert.  Psychiatric:        Mood and Affect: Mood normal.        Behavior: Behavior normal.     UC Treatments / Results  Labs (all labs ordered are listed, but only abnormal results are displayed) Labs Reviewed  POC HEMOCCULT BLD/STL (OFFICE/1-CARD/DIAGNOSTIC) - Abnormal; Notable for the following components:      Result Value   Fecal Occult Blood, POC Positive (*)    All other components within normal limits  CBC  COMPREHENSIVE METABOLIC PANEL    EKG   Radiology No results found.  Procedures Procedures (including critical care time)  Medications Ordered in UC  Medications - No data to display  Initial Impression / Assessment and Plan / UC Course  I have reviewed the triage vital signs and the nursing notes.  Pertinent labs & imaging results that were available during my care of the patient were reviewed by me and considered in my medical decision making (see chart for details).    Diarrhea, hematochezia, left lower quadrant abdominal pain.  Patient declines transfer to the ED.  She is afebrile, vital signs are stable.  Abdomen is soft and nontender to palpation with good bowel sounds.  Stool guaiac positive for blood.  CBC and CMP pending.  Based on her age >23, bloody mucoid stools, more than 6 episodes of diarrhea a day, treating with 1 g  of Zithromax x1 dose.  Strict ED precautions discussed.  Instructed her to follow-up with her PCP on Monday.  Education provided on bloody diarrhea and abdominal pain.  Patient agrees to plan of care.  Final Clinical Impressions(s) / UC Diagnoses   Final diagnoses:  Diarrhea, unspecified type  Hematochezia  Left lower quadrant abdominal pain     Discharge Instructions      Go to the emergency department if you have acute abdominal pain, worsening diarrhea, feeling of dehydration, or other concerning symptoms.    Take the Zithromax as directed.  Your blood work is pending.  Follow up with your primary care provider on Monday.         ED Prescriptions     Medication Sig Dispense Auth. Provider   azithromycin (ZITHROMAX) 250 MG tablet Take 4 tablets (1,000 mg total) by mouth once for 1 dose. Take first 2 tablets together, then 1 every day until finished. 4 tablet Sharion Balloon, NP      PDMP not reviewed this encounter.   Sharion Balloon, NP 03/05/21 1011

## 2021-03-06 LAB — COMPREHENSIVE METABOLIC PANEL
ALT: 16 IU/L (ref 0–32)
AST: 18 IU/L (ref 0–40)
Albumin/Globulin Ratio: 1.5 (ref 1.2–2.2)
Albumin: 4.1 g/dL (ref 3.7–4.7)
Alkaline Phosphatase: 82 IU/L (ref 44–121)
BUN/Creatinine Ratio: 17 (ref 12–28)
BUN: 9 mg/dL (ref 8–27)
Bilirubin Total: 0.4 mg/dL (ref 0.0–1.2)
CO2: 22 mmol/L (ref 20–29)
Calcium: 7.5 mg/dL — ABNORMAL LOW (ref 8.7–10.3)
Chloride: 101 mmol/L (ref 96–106)
Creatinine, Ser: 0.53 mg/dL — ABNORMAL LOW (ref 0.57–1.00)
Globulin, Total: 2.8 g/dL (ref 1.5–4.5)
Glucose: 105 mg/dL — ABNORMAL HIGH (ref 70–99)
Potassium: 4.2 mmol/L (ref 3.5–5.2)
Sodium: 137 mmol/L (ref 134–144)
Total Protein: 6.9 g/dL (ref 6.0–8.5)
eGFR: 96 mL/min/{1.73_m2} (ref >59)

## 2021-03-06 LAB — CBC
Hematocrit: 43.3 % (ref 34.0–46.6)
Hemoglobin: 14 g/dL (ref 11.1–15.9)
MCH: 27.9 pg (ref 26.6–33.0)
MCHC: 32.3 g/dL (ref 31.5–35.7)
MCV: 86 fL (ref 79–97)
Platelets: 265 10*3/uL (ref 150–450)
RBC: 5.01 x10E6/uL (ref 3.77–5.28)
RDW: 14 % (ref 11.7–15.4)
WBC: 8.3 10*3/uL (ref 3.4–10.8)

## 2021-03-09 ENCOUNTER — Encounter: Payer: Self-pay | Admitting: Family Medicine

## 2021-03-09 ENCOUNTER — Other Ambulatory Visit: Payer: Self-pay

## 2021-03-09 ENCOUNTER — Telehealth (INDEPENDENT_AMBULATORY_CARE_PROVIDER_SITE_OTHER): Payer: Medicare Other | Admitting: Family Medicine

## 2021-03-09 ENCOUNTER — Encounter: Payer: Medicare Other | Admitting: Speech Pathology

## 2021-03-09 DIAGNOSIS — R221 Localized swelling, mass and lump, neck: Secondary | ICD-10-CM | POA: Diagnosis not present

## 2021-03-09 DIAGNOSIS — R197 Diarrhea, unspecified: Secondary | ICD-10-CM | POA: Insufficient documentation

## 2021-03-09 NOTE — Assessment & Plan Note (Signed)
Pt notes swollen area/lump under R mandible that is a little sore  Some mild rhinorrhea and cough  Could be early viral uri  inst to monitor and update [place]  If worse or no imp f/u for in office visit  Home test for covid if needed

## 2021-03-09 NOTE — Assessment & Plan Note (Signed)
Most likely food bourne illness, now improved /almost resolved after tx with azithromycin Reviewed UC records and lab today with pt  Tolerated medicine well  Enc pt to keep working on fluid intake  Then advance diet very slowly from bland to normal  Red flags rev  Update if not starting to improve in a week or if worsening

## 2021-03-09 NOTE — Progress Notes (Signed)
Virtual Visit via Video Note  I connected with Lauren [name] on 03/09/21 at 11:00 AM EST by a video enabled telemedicine application and verified that I am speaking with the correct person using two identifiers.  Location: Patient: home Provider: office    I discussed the limitations of evaluation and management by telemedicine and the availability of in person appointments. The patient expressed understanding and agreed to proceed.  Parties involved in encounter  Patient: Lauren [name]  Provider:  Loura Pardon MD   History of Present Illness: Pt presents for f/u of UC visit    She [facility] [place] [facility]   Had eaten salad at a buffet 11 days prior  Stool had brb with mucous LLQ abd cramping with frequent diarrhea   A/p froDiarrhea, hematochezia, left lower quadrant abdominal pain.  Patient declines transfer to the ED.  She is afebrile, vital signs are stable.  Abdomen is soft and nontender to palpation with good bowel sounds.  Stool guaiac positive for blood.  CBC and CMP pending.  Based on her age >13, bloody mucoid stools, more than 6 episodes of diarrhea a day, treating with 1 g of Zithromax x1 dose.  Strict ED precautions discussed.  Instructed her to follow-up with her PCP on Monday.  Education provided on bloody diarrhea and abdominal pain.  Patient agrees to plan of care.m chart  Lab Results  Component Value Date   CREATININE 0.53 (L) 03/05/2021   BUN 9 03/05/2021   NA 137 03/05/2021   K 4.2 03/05/2021   CL 101 03/05/2021   CO2 22 03/05/2021   Lab Results  Component Value Date   ALT 16 03/05/2021   AST 18 03/05/2021   ALKPHOS 82 03/05/2021   BILITOT 0.4 03/05/2021   Lab Results  Component Value Date   WBC 8.3 03/05/2021   HGB 14.0 03/05/2021   HCT 43.3 03/05/2021   MCV 86 03/05/2021   PLT 265 03/05/2021    Now getting better  Was on the brat diet  Started normal bm yesterday and this am  Doing better  Scant blood from  hemorrhoids only (no longer the blood she had)  Mucous is gone also  No longer has any cramping  Is fairly sure she had food poisoning  No longer   She had a lot of body aches during the episode and that is better also   Is wiped out   Has a swollen lymph node- under jaw on the R side  Started yesterday  No ST  Is uncomfortable to swallow   Baseline pnd  A little cough this am - ? Coming down with a cold  Did a covid test and it was negative   Patient Active Problem List   Diagnosis Date Noted   Diarrhea 03/09/2021   Lump in neck 03/09/2021   Acute cough 12/08/2020   Atrial fibrillation ([place]) 09/29/2020   Myofascial pain 09/29/2020   History of CVA (cerebrovascular accident) 06/13/2020   DNR (do not resuscitate) 06/13/2020   Elevated blood pressure reading 01/28/2020   Dermatochalasis of both upper eyelids 07/15/2019   Ptosis of both upper eyelids 07/15/2019   Carpal tunnel syndrome 01/25/2019   Age-related nuclear cataract, bilateral 06/19/2018   Vitamin B12 deficiency 03/27/2018   Sjogren's disease ([place]) 01/15/2018   Osteoarthritis 01/15/2018   Obesity (BMI 30-39.9) 01/15/2018   H/O herpes zoster 11/22/2017   Estrogen deficiency 01/09/2017   Routine general medical examination at a health  care facility 01/01/2017   OSA (obstructive sleep apnea) 09/18/2016   Colon cancer screening 12/24/2014   Bee sting allergy 06/10/2014   Morbid obesity ([place]) 06/10/2014   Encounter for Medicare annual wellness exam 12/03/2013   History of shingles 11/27/2013   Hx of breast cancer 08/22/2011   Gynecological examination 02/14/2011   Bilateral dry eyes 01/20/2011   Dry eye syndrome of bilateral lacrimal glands 01/20/2011   Vitamin D deficiency 04/22/2008   Hyperlipidemia 04/22/2008   GERD 04/22/2008   Prediabetes 04/22/2008   Past Medical History:  Diagnosis Date   Breast cancer ([place]) 2002   DCIS; BRCA 1 pos HER2 pos, ER2 pos   DDD (degenerative disc disease)    DNR (do not  resuscitate) 06/13/2020   GERD (gastroesophageal reflux disease)    History of echocardiogram    a. 06/2020 Echo: EF 55-60%, no rwma, nl RV size/fxn. Mildly dil LA.   HLD (hyperlipidemia)    statin intolerant   Hyperglycemia 10/2007   A1C elvated to 6.2   Obstructive sleep apnea    Osteoarthritis    Osteopenia 2007   ? from xray with nl dexa   Persistent atrial fibrillation ([place])    a. Dx 06/2020 in setting of stroke. CHA2DS2VASc = 5-->Eliquis; b. 08/2020 Successful DCCV; c. 09/2020 Office f/u- pt in rate-controlled Afib.   Reactive depression (situational)    very well controlled, and now takes prozac for sleep   Sjogren's syndrome ([place])    Stroke ([place]) 06/2020   Urine incontinence    Vitamin D deficiency    Past Surgical History:  Procedure Laterality Date   BILATERAL OOPHORECTOMY  205   CARDIOVERSION N/A 08/24/2020   Procedure: CARDIOVERSION;  Surgeon: Wellington Hampshire, MD;  Location: [facility] ORS;  Service: Cardiovascular;  Laterality: N/A;   CATARACT EXTRACTION, BILATERAL     10/2019, 12/2019   COLONOSCOPY  5/07   normal; recheck 3 years   [place]   Ruptured disk L5   MASTECTOMY  2001   bilateral   TONSILLECTOMY  1974   TOTAL HIP ARTHROPLASTY  2011   TOTAL KNEE ARTHROPLASTY  2006   bilateral   Social History   Tobacco Use   Smoking status: Never   Smokeless tobacco: Never  Vaping Use   Vaping Use: Never used  Substance Use Topics   Alcohol use: Not Currently    Alcohol/week: 0.0 standard drinks    Comment: rare, none in 2 years   Drug use: No   Family History  Problem Relation Age of Onset   Arthritis Other        family hx   Breast cancer Other        1st degree relative <50   Diabetes Other        1st degree relative   Hyperlipidemia Other        family hx   Hypertension Other        family hx   Ovarian cancer Other        family hx   Prostate cancer Other        1st degree relative <50   Dementia Mother    Psoriasis Brother     Other Daughter        BRCA gene   Brain cancer Brother    Stroke Maternal Grandmother    Allergies  Allergen Reactions   Crestor [Rosuvastatin]     myalgias   Lipitor [Atorvastatin]     myalgias  Statins     joint pain   Wasp Venom Swelling   Current Outpatient Medications on File Prior to Visit  Medication Sig Dispense Refill   acetaminophen (TYLENOL) 500 MG tablet Take 1,000 mg by mouth every 6 (six) hours as needed for moderate pain.     apixaban (ELIQUIS) 5 MG TABS tablet Take 1 tablet by mouth twice daily 180 tablet 1   Carboxymethylcellul-Glycerin (LUBRICATING EYE DROPS OP) Place 1 drop into both eyes 5 (five) times daily.     cetirizine (ZYRTEC) 10 MG tablet Take 10 mg by mouth at bedtime.     DULoxetine (CYMBALTA) 30 MG capsule Take 1 capsule (30 mg total) by mouth daily. 90 capsule 3   EPINEPHrine 0.3 mg/0.3 mL IJ SOAJ injection Inject 0.3 mLs (0.3 mg total) into the muscle once. (Patient taking differently: Inject 0.3 mg into the muscle as needed for anaphylaxis.) 1 Device 5   Evolocumab (REPATHA SURECLICK) 350 MG/ML SOAJ Inject 140 mg into the skin every 14 (fourteen) days. 2 mL 11   Fish Oil-Cholecalciferol (OMEGA-3 + VITAMIN D3 PO) Take 2 capsules by mouth daily.      gabapentin (NEURONTIN) 300 MG capsule Take one by mouth in the am, one mid day and 2 at bedtime 360 capsule 3   metoprolol tartrate (LOPRESSOR) 25 MG tablet Take 1 tablet by mouth twice daily 180 tablet 2   omeprazole (PRILOSEC) 20 MG capsule Take 20 mg by mouth daily.     oxybutynin (OXYTROL) 3.9 MG/24HR Place 1 patch onto the skin 2 (two) times a week. 24 patch 3   pilocarpine (SALAGEN) 5 MG tablet Take 1 tablet (5 mg total) by mouth 3 (three) times daily. 270 tablet 3   No current facility-administered medications on file prior to visit.    Review of Systems  Constitutional:  Negative for chills, fever and malaise/fatigue.  HENT:  Positive for congestion. Negative for ear pain, sinus pain and sore  throat.   Eyes:  Negative for blurred vision, discharge and redness.  Respiratory:  Positive for cough. Negative for sputum production, shortness of breath, wheezing and stridor.   Cardiovascular:  Negative for chest pain, palpitations and leg swelling.  Gastrointestinal:  Positive for diarrhea. Negative for abdominal pain, blood in stool, constipation, nausea and vomiting.       Diarrhea is much improved  Musculoskeletal:  Negative for myalgias.  Skin:  Negative for rash.  Neurological:  Negative for dizziness and headaches.   Observations/Objective: Patient appears well, in no distress Weight is baseline  No facial swelling or asymmetry Normal voice-not hoarse and no slurred speech No obvious tremor or mobility impairment Moving neck and UEs normally Able to hear the call well  No cough or shortness of breath during interview  Talkative and mentally sharp with no cognitive changes No skin changes on face or neck , no rash or pallor Affect is normal    Assessment and Plan: Problem List Items Addressed This Visit       Other   Diarrhea    Most likely food bourne illness, now improved /almost resolved after tx with azithromycin Reviewed UC records and lab today with pt  Tolerated medicine well  Enc pt to keep working on fluid intake  Then advance diet very slowly from bland to normal  Red flags rev  Update if not starting to improve in a week or if worsening        Lump in neck    Pt notes swollen  area/lump under R mandible that is a little sore  Some mild rhinorrhea and cough  Could be early viral uri  inst to monitor and update [place]  If worse or no imp f/u for in office visit  Home test for covid if needed         Follow Up Instructions: Keep drinking fluids Gradually advance diet as tolerated  Alert [place] if diarrhea or other symptoms return or any symptoms of dehydration (rapid heart beat or dizziness)  If the lump in your neck gets bigger please call for an appt   If you develop a cold please monitor for covid and keep [place] posted   If any severe symptoms go to the ER   I discussed the assessment and treatment plan with the patient. The patient was provided an opportunity to ask questions and all were answered. The patient agreed with the plan and demonstrated an understanding of the instructions.   The patient was advised to call back or seek an in-person evaluation if the symptoms worsen or if the condition fails to improve as anticipated.     Loura Pardon, MD

## 2021-03-09 NOTE — Patient Instructions (Signed)
Keep drinking fluids Gradually advance diet as tolerated  Alert [place] if diarrhea or other symptoms return or any symptoms of dehydration (rapid heart beat or dizziness)  If the lump in your neck gets bigger please call for an appt  If you develop a cold please monitor for covid and keep [place] posted   If any severe symptoms go to the ER

## 2021-03-11 ENCOUNTER — Encounter: Payer: Medicare Other | Admitting: Speech Pathology

## 2021-03-15 DIAGNOSIS — M159 Polyosteoarthritis, unspecified: Secondary | ICD-10-CM | POA: Diagnosis not present

## 2021-03-15 DIAGNOSIS — G4733 Obstructive sleep apnea (adult) (pediatric): Secondary | ICD-10-CM | POA: Diagnosis not present

## 2021-03-15 DIAGNOSIS — G5601 Carpal tunnel syndrome, right upper limb: Secondary | ICD-10-CM | POA: Diagnosis not present

## 2021-03-15 DIAGNOSIS — Z8679 Personal history of other diseases of the circulatory system: Secondary | ICD-10-CM | POA: Diagnosis not present

## 2021-03-15 DIAGNOSIS — G3184 Mild cognitive impairment, so stated: Secondary | ICD-10-CM | POA: Diagnosis not present

## 2021-03-15 DIAGNOSIS — G5602 Carpal tunnel syndrome, left upper limb: Secondary | ICD-10-CM | POA: Diagnosis not present

## 2021-03-15 DIAGNOSIS — Z8673 Personal history of transient ischemic attack (TIA), and cerebral infarction without residual deficits: Secondary | ICD-10-CM | POA: Diagnosis not present

## 2021-03-16 ENCOUNTER — Encounter: Payer: Medicare Other | Admitting: Speech Pathology

## 2021-03-18 ENCOUNTER — Encounter: Payer: Medicare Other | Admitting: Speech Pathology

## 2021-03-23 ENCOUNTER — Encounter: Payer: Medicare Other | Admitting: Speech Pathology

## 2021-03-25 ENCOUNTER — Encounter: Payer: Medicare Other | Admitting: Speech Pathology

## 2021-03-30 ENCOUNTER — Encounter: Payer: Medicare Other | Admitting: Speech Pathology

## 2021-04-01 ENCOUNTER — Encounter: Payer: Medicare Other | Admitting: Speech Pathology

## 2021-04-06 ENCOUNTER — Encounter: Payer: Medicare Other | Admitting: Speech Pathology

## 2021-04-08 ENCOUNTER — Encounter: Payer: Medicare Other | Admitting: Speech Pathology

## 2021-04-16 ENCOUNTER — Ambulatory Visit: Payer: Medicare Other | Admitting: Acute Care

## 2021-04-20 ENCOUNTER — Other Ambulatory Visit: Payer: Self-pay | Admitting: Family Medicine

## 2021-04-20 DIAGNOSIS — Z20822 Contact with and (suspected) exposure to covid-19: Secondary | ICD-10-CM | POA: Diagnosis not present

## 2021-04-21 ENCOUNTER — Telehealth: Payer: Self-pay | Admitting: Family Medicine

## 2021-04-21 NOTE — Chronic Care Management (AMB) (Signed)
?  Chronic Care Management  ? ?Outreach Note ? ?04/21/2021 ?Name: Lauren [name] MRN: [id number] DOB: 06/25/1945 ? ?Referred by: Tower, Wynelle Fanny, MD ?Reason for referral : No chief complaint on file. ? ? ?An unsuccessful telephone outreach was attempted today. The patient was referred to the pharmacist for assistance with care management and care coordination.  ? ?Follow Up Plan:  ? ?Lauren [name] ?Upstream Scheduler  ?

## 2021-04-21 NOTE — Progress Notes (Signed)
?  Chronic Care Management  ? ?Note ? ?04/21/2021 ?Name: Lauren [name] MRN: [id number] DOB: [birth year] ? ?Lauren [name] is a 76 y.o. year old female who is a primary care patient of Tower, Wynelle Fanny, MD. I reached out to [name] by phone today in response to a referral sent by Ms. Cordelia Poche PCP, Tower, Wynelle Fanny, MD.  ? ?Ms. Demorest was given information about Chronic Care Management services today including:  ?CCM service includes personalized support from designated clinical staff supervised by her physician, including individualized plan of care and coordination with other care providers ?24/7 contact phone numbers for assistance for urgent and routine care needs. ?Service will only be billed when office clinical staff spend 20 minutes or more in a month to coordinate care. ?Only one practitioner may furnish and bill the service in a calendar month. ?The patient may stop CCM services at any time (effective at the end of the month) by phone call to the office staff. ? ? ?Patient agreed to services and verbal consent obtained.  ? ?Follow up plan: ? ? ?Tatjana Dellinger ?Upstream Scheduler  ?

## 2021-04-23 ENCOUNTER — Encounter: Payer: Self-pay | Admitting: Emergency Medicine

## 2021-04-23 ENCOUNTER — Other Ambulatory Visit: Payer: Self-pay

## 2021-04-23 ENCOUNTER — Ambulatory Visit
Admission: EM | Admit: 2021-04-23 | Discharge: 2021-04-23 | Disposition: A | Discharge status: Home / Self Care | Payer: Medicare Other

## 2021-04-23 DIAGNOSIS — S5011XA Contusion of right forearm, initial encounter: Secondary | ICD-10-CM | POA: Diagnosis not present

## 2021-04-23 NOTE — Discharge Instructions (Signed)
Return if any problems.

## 2021-04-23 NOTE — ED Provider Notes (Signed)
Lauren [name]    CSN: [id number] Arrival date & time: 04/23/21  0802      History   Chief Complaint Chief Complaint  Patient presents with   Fall   Wound Check    HPI Lauren [name] is a 76 y.o. female.   She fell 2 weeks ago she struck her right forearm patient reports she had a large bruise the bruising has resolved however she still has a swollen area.  Patient is concerned because she is getting ready to travel to [place].  Patient denies any fever she denies any chills he has not noticed any signs of infection  The history is provided by the patient.  Fall  Wound Check   Past Medical History:  Diagnosis Date   Breast cancer (HCC) 2002   DCIS; BRCA 1 pos HER2 pos, ER2 pos   DDD (degenerative disc disease)    DNR (do not resuscitate) 06/13/2020   GERD (gastroesophageal reflux disease)    History of echocardiogram    a. 06/2020 Echo: EF 55-60%, no rwma, nl RV size/fxn. Mildly dil LA.   HLD (hyperlipidemia)    statin intolerant   Hyperglycemia 10/2007   A1C elvated to 6.2   Obstructive sleep apnea    Osteoarthritis    Osteopenia 2007   ? from xray with nl dexa   Persistent atrial fibrillation (HCC)    a. Dx 06/2020 in setting of stroke. CHA2DS2VASc = 5-->Eliquis; b. 08/2020 Successful DCCV; c. 09/2020 Office f/u- pt in rate-controlled Afib.   Reactive depression (situational)    very well controlled, and now takes prozac for sleep   Sjogren's syndrome (HCC)    Stroke (HCC) 06/2020   Urine incontinence    Vitamin D deficiency     Patient Active Problem List   Diagnosis Date Noted   Diarrhea 03/09/2021   Lump in neck 03/09/2021   Acute cough 12/08/2020   Atrial fibrillation (HCC) 09/29/2020   Myofascial pain 09/29/2020   History of CVA (cerebrovascular accident) 06/13/2020   DNR (do not resuscitate) 06/13/2020   Elevated blood pressure reading 01/28/2020   Dermatochalasis of both upper eyelids 07/15/2019   Ptosis of both upper eyelids 07/15/2019    Carpal tunnel syndrome 01/25/2019   Age-related nuclear cataract, bilateral 06/19/2018   Vitamin B12 deficiency 03/27/2018   Sjogren's disease (HCC) 01/15/2018   Osteoarthritis 01/15/2018   Obesity (BMI 30-39.9) 01/15/2018   H/O herpes zoster 11/22/2017   Estrogen deficiency 01/09/2017   Routine general medical examination at a health care facility 01/01/2017   OSA (obstructive sleep apnea) 09/18/2016   Colon cancer screening 12/24/2014   Bee sting allergy 06/10/2014   Morbid obesity (HCC) 06/10/2014   Encounter for Medicare annual wellness exam 12/03/2013   History of shingles 11/27/2013   Hx of breast cancer 08/22/2011   Gynecological examination 02/14/2011   Bilateral dry eyes 01/20/2011   Dry eye syndrome of bilateral lacrimal glands 01/20/2011   Vitamin D deficiency 04/22/2008   Hyperlipidemia 04/22/2008   GERD 04/22/2008   Prediabetes 04/22/2008    Past Surgical History:  Procedure Laterality Date   BILATERAL OOPHORECTOMY  205   CARDIOVERSION N/A 08/24/2020   Procedure: CARDIOVERSION;  Surgeon: Iran Ouch, MD;  Location: [facility] ORS;  Service: Cardiovascular;  Laterality: N/A;   CATARACT EXTRACTION, BILATERAL     10/2019, 12/2019   COLONOSCOPY  5/07   normal; recheck 3 years   LUMBAR DISC SURGERY  1991   Ruptured disk L5   MASTECTOMY  2001  bilateral   TONSILLECTOMY  1974   TOTAL HIP ARTHROPLASTY  2011   TOTAL KNEE ARTHROPLASTY  2006   bilateral    OB History   No obstetric history on file.      Home Medications    Prior to Admission medications   Medication Sig Start Date End Date Taking? Authorizing Provider  acetaminophen (TYLENOL) 500 MG tablet Take 1,000 mg by mouth every 6 (six) hours as needed for moderate pain.    [provider]  apixaban (ELIQUIS) 5 MG TABS tablet Take 1 tablet by mouth twice daily 12/28/20   Iran Ouch, MD  Carboxymethylcellul-Glycerin (LUBRICATING EYE DROPS OP) Place 1 drop into both eyes 5 (five) times  daily.    [provider]  cetirizine (ZYRTEC) 10 MG tablet Take 10 mg by mouth at bedtime.    [provider]  DULoxetine (CYMBALTA) 30 MG capsule Take 1 capsule (30 mg total) by mouth daily. 02/19/21   Tower, Audrie Gallus, MD  EPINEPHrine 0.3 mg/0.3 mL IJ SOAJ injection Inject 0.3 mLs (0.3 mg total) into the muscle once. Patient taking differently: Inject 0.3 mg into the muscle as needed for anaphylaxis. 06/10/14   Tower, Audrie Gallus, MD  Evolocumab (REPATHA SURECLICK) 140 MG/ML SOAJ Inject 140 mg into the skin every 14 (fourteen) days. 08/27/20   Iran Ouch, MD  Fish Oil-Cholecalciferol (OMEGA-3 + VITAMIN D3 PO) Take 2 capsules by mouth daily.     [provider]  gabapentin (NEURONTIN) 300 MG capsule Take one by mouth in the am, one mid day and 2 at bedtime 02/19/21   Tower, Audrie Gallus, MD  metoprolol tartrate (LOPRESSOR) 25 MG tablet Take 1 tablet by mouth twice daily 12/25/20   Iran Ouch, MD  omeprazole (PRILOSEC) 20 MG capsule Take 20 mg by mouth daily.    [provider]  oxybutynin (OXYTROL) 3.9 MG/24HR Place 1 patch onto the skin 2 (two) times a week. 02/22/21   Tower, Audrie Gallus, MD  pilocarpine (SALAGEN) 5 MG tablet Take 1 tablet (5 mg total) by mouth 3 (three) times daily. 02/19/21   Tower, Audrie Gallus, MD    Family History Family History  Problem Relation Age of Onset   Arthritis Other        family hx   Breast cancer Other        1st degree relative <50   Diabetes Other        1st degree relative   Hyperlipidemia Other        family hx   Hypertension Other        family hx   Ovarian cancer Other        family hx   Prostate cancer Other        1st degree relative <50   Dementia Mother    Psoriasis Brother    Other Daughter        BRCA gene   Brain cancer Brother    Stroke Maternal Grandmother     Social History Social History   Tobacco Use   Smoking status: Never   Smokeless tobacco: Never  Vaping Use   Vaping Use: Never used   Substance Use Topics   Alcohol use: Not Currently    Alcohol/week: 0.0 standard drinks    Comment: rare, none in 2 years   Drug use: No     Allergies   Crestor [rosuvastatin], Lipitor [atorvastatin], Statins, and Wasp venom   Review of Systems Review of Systems  All other systems reviewed and are negative.   Physical Exam Triage Vital Signs ED Triage Vitals  Enc Vitals Group     BP 04/23/21 0812 (!) 150/96     Pulse Rate 04/23/21 0812 (!) 105     Resp 04/23/21 0812 20     Temp 04/23/21 0812 97.9 F (36.6 C)     Temp src --      SpO2 04/23/21 0812 97 %     Weight --      Height --      Head Circumference --      Peak Flow --      Pain Score 04/23/21 0814 2     Pain Loc --      Pain Edu? --      Excl. in GC? --    No data found.  Updated Vital Signs BP (!) 150/96   Pulse (!) 105   Temp 97.9 F (36.6 C)   Resp 20   SpO2 97%   Visual Acuity Right Eye Distance:   Left Eye Distance:   Bilateral Distance:    Right Eye Near:   Left Eye Near:    Bilateral Near:     Physical Exam Vitals reviewed.  Constitutional:      Appearance: Normal appearance.  Cardiovascular:     Rate and Rhythm: Normal rate.  Pulmonary:     Effort: Pulmonary effort is normal.  Musculoskeletal:        General: Swelling and tenderness present.     Comments: 4 cm hematoma right forearm small open abrasion no drainage no sign of infection  Skin:    General: Skin is warm.     Capillary Refill: Capillary refill takes less than 2 seconds.  Neurological:     General: No focal deficit present.     Mental Status: She is alert.     UC Treatments / Results  Labs (all labs ordered are listed, but only abnormal results are displayed) Labs Reviewed - No data to display  EKG   Radiology No results found.  Procedures Procedures (including critical care time)  Medications Ordered in UC Medications - No data to display  Initial Impression / Assessment and Plan / UC Course  I  have reviewed the triage vital signs and the nursing notes.  Pertinent labs & imaging results that were available during my care of the patient were reviewed by me and considered in my medical decision making (see chart for details).     MDM patient advised hematoma should continue to absorb she does not have any sign of infection she is advised to use topical antibiotic to area of wound.  Patient is advised to return if any problems Final Clinical Impressions(s) / UC Diagnoses   Final diagnoses:  Traumatic hematoma of right forearm, initial encounter     Discharge Instructions      Return if any problems.   ED Prescriptions   None    PDMP not reviewed this encounter. An After Visit Summary was printed and given to the patient.    Elson Areas, [place] 04/23/21 0830

## 2021-04-23 NOTE — ED Triage Notes (Signed)
Pt here with right forearm hematoma and wound from fall 2 weeks ago. Everything was healing well, but the bump on her arm continues to grow. Pt on eliquis.  ?

## 2021-04-26 ENCOUNTER — Other Ambulatory Visit: Payer: Self-pay | Admitting: Family Medicine

## 2021-05-19 ENCOUNTER — Telehealth: Payer: Self-pay

## 2021-05-19 NOTE — Progress Notes (Signed)
? ? ?Chronic Care Management ?Pharmacy Assistant  ? ?Name: Lauren [name]  MRN: [id number] DOB: 1945-10-26 ? ?Reason for Encounter: CCM (Initial Questions) ?  ?Recent office visits:  ?03/09/21 Loura Pardon, MD Diarrhea No med changes.  ?02/19/21 Loura Pardon, MD Annual Exam Change: Gabapentin 300 mg Take one in am, mid day and 2 at bedtime (vs 3 times daily).  ?02/15/21 AWV ? ?Recent consult visits:  ?04/23/21 [facility] Traumatic Hematoma of right forearm. No med changes.  ?03/15/21 Jennings Books, MD (Neurology): Right carpal tunnel syndrome. Referral to Physical Therapy. ?03/05/21 [facility] Diarrhea Start: Zithromax 250 mg.  ?02/24/21 Jennings Books, MD (Neurology): Left carpal tunnel syndrome. Procedure: Carpal Tunnel injection. Administered: 0.75 ml__ Triamcinolone Acetonide (40 mg/ml) ?02/17/21 Jennings Books, MD (Neurology): Left carpal tunnel syndrome. Procedure: Carpal Tunnel injection. Administered: 0.75 ml__ Triamcinolone Acetonide (40 mg/ml) ?01/18/21 Audiology: Hearing test ?01/04/21 Mayur Posey Pronto, MD (Rheumatology): Sjogren's syndrome. F/U in 4 months.  ?12/22/20 Kathlyn Sacramento, MD (Cardiology): Permanent Atrial Fibrillation: EKG. No med changes.  ?12/18/20 Chesley Mires, MD (Pulmonology): Referral for CPAP ?12/08/20 Karl Ito, NP (Pain Medicine): Bronchitis: Start: Zithromax 250 mg. Start: Tessalon 200 mg. Stop (change therapy): Ezetimibe 10 mg.  ? ?Outpatient Rehab Visits: ?Cognitive Communication Deficit: 9 visits ?12/11/20 -  ? ?Hospital visits:  ?None in previous 6 months ? ?Medications: ?Outpatient Encounter Medications as of 05/19/2021  ?Medication Sig  ? acetaminophen (TYLENOL) 500 MG tablet Take 1,000 mg by mouth every 6 (six) hours as needed for moderate pain.  ? apixaban (ELIQUIS) 5 MG TABS tablet Take 1 tablet by mouth twice daily  ? Carboxymethylcellul-Glycerin (LUBRICATING EYE DROPS OP) Place 1 drop into both eyes 5 (five) times daily.  ? cetirizine (ZYRTEC) 10 MG tablet Take  10 mg by mouth at bedtime.  ? DULoxetine (CYMBALTA) 30 MG capsule Take 1 capsule (30 mg total) by mouth daily.  ? EPINEPHrine 0.3 mg/0.3 mL IJ SOAJ injection Inject 0.3 mLs (0.3 mg total) into the muscle once. (Patient taking differently: Inject 0.3 mg into the muscle as needed for anaphylaxis.)  ? Evolocumab (REPATHA SURECLICK) 665 MG/ML SOAJ Inject 140 mg into the skin every 14 (fourteen) days.  ? Fish Oil-Cholecalciferol (OMEGA-3 + VITAMIN D3 PO) Take 2 capsules by mouth daily.   ? gabapentin (NEURONTIN) 300 MG capsule Take one by mouth in the am, one mid day and 2 at bedtime  ? metoprolol tartrate (LOPRESSOR) 25 MG tablet Take 1 tablet by mouth twice daily  ? omeprazole (PRILOSEC) 20 MG capsule Take 20 mg by mouth daily.  ? oxybutynin (OXYTROL) 3.9 MG/24HR Place 1 patch onto the skin 2 (two) times a week.  ? pilocarpine (SALAGEN) 5 MG tablet Take 1 tablet (5 mg total) by mouth 3 (three) times daily.  ? ?No facility-administered encounter medications on file as of 05/19/2021.  ? ?Lab Results  ?Component Value Date/Time  ? HGBA1C 6.7 (H) 02/12/2021 09:56 AM  ? HGBA1C 6.1 (H) 06/14/2020 03:01 AM  ?  ?BP Readings from Last 3 Encounters:  ?04/23/21 (!) 150/96  ?03/05/21 123/82  ?02/19/21 132/80  ? ?Patient contacted to confirm in office appointment with [name], Pharm D, on 05/24/2021 at 9:30 after appointment with Dr. Glori Bickers. ? ?Do you have any problems getting your medications? No ? ?What is your top health concern you would like to discuss at your upcoming visit? Patient has an appointment to see Dr. Glori Bickers on 05/24/2021 as well for her 3 month follow up for pre-diabetes. Her A1c  was over 6.5 on 02/19/2021. Patient was wondering if she is supposed to have lab work completed before this appointment as she does not have anything scheduled. Patient would like a call back from the office so she can schedule labs if needed; patient gave verbal approval to leave a detailed voice mail if she does not answer.   ? ?Have you seen any other providers since your last visit with PCP? Yes ? ?Star Rating Drugs:  ?Medication:  Last Fill: Day Supply ?No star rating drugs noted ? ?Care Gaps: ?Annual wellness visit in last year? Yes 02/15/2021 ?Most Recent BP reading: 150/96 on 04/23/2021 ? ?[name], CPP notified ? ?Marijean Niemann, RMA ?Clinical Pharmacy Assistant ?[phone] ? ? ? ? ?

## 2021-05-19 NOTE — Telephone Encounter (Signed)
We will do A1C finger stick while she is here  ?No prior labs needed ?Thanks  ?

## 2021-05-19 NOTE — Telephone Encounter (Signed)
Patient wants to know if she needs to get any labs prior to PCP appt 4/17 at 8:00. She would like to be notified if she needs to make lab appt. ?

## 2021-05-24 ENCOUNTER — Ambulatory Visit (INDEPENDENT_AMBULATORY_CARE_PROVIDER_SITE_OTHER): Payer: Medicare Other | Admitting: Family Medicine

## 2021-05-24 ENCOUNTER — Encounter: Payer: Self-pay | Admitting: Family Medicine

## 2021-05-24 ENCOUNTER — Ambulatory Visit (INDEPENDENT_AMBULATORY_CARE_PROVIDER_SITE_OTHER): Payer: Medicare Other | Admitting: Pharmacist

## 2021-05-24 VITALS — BP 124/72 | HR 68 | Temp 97.4°F | Ht 67.5 in | Wt 254.0 lb

## 2021-05-24 DIAGNOSIS — R7303 Prediabetes: Secondary | ICD-10-CM

## 2021-05-24 DIAGNOSIS — K589 Irritable bowel syndrome without diarrhea: Secondary | ICD-10-CM | POA: Insufficient documentation

## 2021-05-24 DIAGNOSIS — E7800 Pure hypercholesterolemia, unspecified: Secondary | ICD-10-CM | POA: Diagnosis not present

## 2021-05-24 DIAGNOSIS — K582 Mixed irritable bowel syndrome: Secondary | ICD-10-CM

## 2021-05-24 DIAGNOSIS — I4821 Permanent atrial fibrillation: Secondary | ICD-10-CM

## 2021-05-24 DIAGNOSIS — Z8673 Personal history of transient ischemic attack (TIA), and cerebral infarction without residual deficits: Secondary | ICD-10-CM

## 2021-05-24 DIAGNOSIS — M3500 Sicca syndrome, unspecified: Secondary | ICD-10-CM

## 2021-05-24 LAB — POCT GLYCOSYLATED HEMOGLOBIN (HGB A1C): Hemoglobin A1C: 6.1 % — AB (ref 4.0–5.6)

## 2021-05-24 MED ORDER — EZETIMIBE 10 MG PO TABS: 10.0000 mg | ORAL_TABLET | Freq: Every day | ORAL | 3 refills | Status: DC

## 2021-05-24 NOTE — Assessment & Plan Note (Signed)
Doing better with Repatha ?Disc goals for lipids and reasons to control them ?Rev last labs with pt ?Rev low sat fat diet in detail ?She would like to remain on Zetia which was sent in for 10 mg daily ?Planning follow-up with cardiology ?Diet is better ?

## 2021-05-24 NOTE — Patient Instructions (Addendum)
Try a fiber supplement like citrucel over the counter daily  ?Mix with water  ?If it does not help in 2 weeks stop it ? ?Worth a try  ?Miralax is great also  ? ?Get in the pool/start exercise when you can  ? ?Keep watching your diet  ?A1c looks better  ? ?

## 2021-05-24 NOTE — Assessment & Plan Note (Addendum)
Intermittent constipation and diarrhea  ? ?Recommend citrucel daily  ?miralax prn  ?Diet is better ? ?Colonoscopy 2011 reviewed  ?Prefers to do cologuard which was neg 04/2020 ?May need to consider diagnostic if symptoms worsen ?

## 2021-05-24 NOTE — Progress Notes (Signed)
? ?Subjective:  ? ? Patient ID: Lauren [name], female    DOB: [birth year], 76 y.o.   MRN: [id number] ? ?HPI ?Pt presents for follow up of prediabetes ? ?Wt Readings from Last 3 Encounters:  ?05/24/21 254 lb (115.2 kg)  ?02/19/21 259 lb 8 oz (117.7 kg)  ?02/15/21 261 lb (118.4 kg)  ? ?39.19 kg/m? ?Intermittent fasting is working for her  ? ?Here for f/u of prediabetes  ?Lab Results  ?Component Value Date  ? HGBA1C 6.7 (H) 02/12/2021  ? ?Now down to 6.1  ? ?Eating well/doing better  ?Lots of salads  ?Not eating sugar as a rule (that has helped with pain quite a bit)  ? ?Had a fall - hit rocks/stones with her arm feeding chickens / had slippery shoes  ?Has a large hematoma still on R forearm  ?Is sore / sore to the touch/cannot lean on it  ? ?Now wears boosts  ? ?Diarrhea/constipation are a problem  ?Has spells and breaks out into sweats when she gets diarrhea  ?Episodes starts with constipation and then has diarrhea  ?She bought some miralax  ? ?Last colonoscopy 2011  ?She does the cologuard  ? ?Hyperlipidemia  ?Wants to keep taking zetia  ?Is on repatha ?Lab Results  ?Component Value Date  ? CHOL 143 02/12/2021  ? HDL 46.40 02/12/2021  ? [place] 81 02/12/2021  ? LDLDIRECT 148.9 08/20/2012  ? TRIG 80.0 02/12/2021  ? CHOLHDL 3 02/12/2021  ? ?Both together work better  ? ?Patient Active Problem List  ? Diagnosis Date Noted  ? IBS (irritable bowel syndrome) 05/24/2021  ? Lump in neck 03/09/2021  ? Atrial fibrillation ([place]) 09/29/2020  ? Myofascial pain 09/29/2020  ? History of CVA (cerebrovascular accident) 06/13/2020  ? DNR (do not resuscitate) 06/13/2020  ? Elevated blood pressure reading 01/28/2020  ? Dermatochalasis of both upper eyelids 07/15/2019  ? Ptosis of both upper eyelids 07/15/2019  ? Carpal tunnel syndrome 01/25/2019  ? Age-related nuclear cataract, bilateral 06/19/2018  ? Vitamin B12 deficiency 03/27/2018  ? Sjogren's disease ([place]) 01/15/2018  ? Osteoarthritis 01/15/2018  ? Obesity (BMI 30-39.9) 01/15/2018   ? H/O herpes zoster 11/22/2017  ? Estrogen deficiency 01/09/2017  ? Routine general medical examination at a health care facility 01/01/2017  ? OSA (obstructive sleep apnea) 09/18/2016  ? Colon cancer screening 12/24/2014  ? Bee sting allergy 06/10/2014  ? Morbid obesity ([place]) 06/10/2014  ? Encounter for Medicare annual wellness exam 12/03/2013  ? History of shingles 11/27/2013  ? Hx of breast cancer 08/22/2011  ? Gynecological examination 02/14/2011  ? Bilateral dry eyes 01/20/2011  ? Dry eye syndrome of bilateral lacrimal glands 01/20/2011  ? Vitamin D deficiency 04/22/2008  ? Hyperlipidemia 04/22/2008  ? GERD 04/22/2008  ? Prediabetes 04/22/2008  ? ?Past Medical History:  ?Diagnosis Date  ? Breast cancer ([place]) 2002  ? DCIS; BRCA 1 pos HER2 pos, ER2 pos  ? DDD (degenerative disc disease)   ? DNR (do not resuscitate) 06/13/2020  ? GERD (gastroesophageal reflux disease)   ? History of echocardiogram   ? a. 06/2020 Echo: EF 55-60%, no rwma, nl RV size/fxn. Mildly dil LA.  ? HLD (hyperlipidemia)   ? statin intolerant  ? Hyperglycemia 10/2007  ? A1C elvated to 6.2  ? Obstructive sleep apnea   ? Osteoarthritis   ? Osteopenia 2007  ? ? from xray with nl dexa  ? Persistent atrial fibrillation ([place])   ? a. Dx 06/2020 in setting of stroke. CHA2DS2VASc = 5-->Eliquis;  b. 08/2020 Successful DCCV; c. 09/2020 Office f/u- pt in rate-controlled Afib.  ? Reactive depression (situational)   ? very well controlled, and now takes prozac for sleep  ? Sjogren's syndrome ([place])   ? Stroke [facility]) 06/2020  ? Urine incontinence   ? Vitamin D deficiency   ? ?Past Surgical History:  ?Procedure Laterality Date  ? BILATERAL OOPHORECTOMY  205  ? CARDIOVERSION N/A 08/24/2020  ? Procedure: CARDIOVERSION;  Surgeon: Wellington Hampshire, MD;  Location: [facility] ORS;  Service: Cardiovascular;  Laterality: N/A;  ? CATARACT EXTRACTION, BILATERAL    ? 10/2019, 12/2019  ? COLONOSCOPY  5/07  ? normal; recheck 3 years  ? [place] SURGERY  1991  ? Ruptured disk L5  ?  MASTECTOMY  2001  ? bilateral  ? TONSILLECTOMY  1974  ? TOTAL HIP ARTHROPLASTY  2011  ? TOTAL KNEE ARTHROPLASTY  2006  ? bilateral  ? ?Social History  ? ?Tobacco Use  ? Smoking status: Never  ? Smokeless tobacco: Never  ?Vaping Use  ? Vaping Use: Never used  ?Substance Use Topics  ? Alcohol use: Not Currently  ?  Alcohol/week: 0.0 standard drinks  ?  Comment: rare, none in 2 years  ? Drug use: No  ? ?Family History  ?Problem Relation Age of Onset  ? Arthritis Other   ?     family hx  ? Breast cancer Other   ?     1st degree relative <50  ? Diabetes Other   ?     1st degree relative  ? Hyperlipidemia Other   ?     family hx  ? Hypertension Other   ?     family hx  ? Ovarian cancer Other   ?     family hx  ? Prostate cancer Other   ?     1st degree relative <50  ? Dementia Mother   ? Psoriasis Brother   ? Other Daughter   ?     BRCA gene  ? Brain cancer Brother   ? Stroke Maternal Grandmother   ? ?Allergies  ?Allergen Reactions  ? Crestor [Rosuvastatin]   ?  myalgias  ? Lipitor [Atorvastatin]   ?  myalgias  ? Statins   ?  joint pain  ? Wasp Venom Swelling  ? ?Current Outpatient Medications on File Prior to Visit  ?Medication Sig Dispense Refill  ? acetaminophen (TYLENOL) 500 MG tablet Take 1,000 mg by mouth every 6 (six) hours as needed for moderate pain.    ? apixaban (ELIQUIS) 5 MG TABS tablet Take 1 tablet by mouth twice daily 180 tablet 1  ? Carboxymethylcellul-Glycerin (LUBRICATING EYE DROPS OP) Place 1 drop into both eyes 5 (five) times daily.    ? cetirizine (ZYRTEC) 10 MG tablet Take 10 mg by mouth at bedtime.    ? DULoxetine (CYMBALTA) 30 MG capsule Take 1 capsule (30 mg total) by mouth daily. 90 capsule 3  ? EPINEPHrine 0.3 mg/0.3 mL IJ SOAJ injection Inject 0.3 mLs (0.3 mg total) into the muscle once. (Patient taking differently: Inject 0.3 mg into the muscle as needed for anaphylaxis.) 1 Device 5  ? Evolocumab (REPATHA SURECLICK) 627 MG/ML SOAJ Inject 140 mg into the skin every 14 (fourteen) days. 2 mL 11   ? Fish Oil-Cholecalciferol (OMEGA-3 + VITAMIN D3 PO) Take 2 capsules by mouth daily.     ? gabapentin (NEURONTIN) 300 MG capsule Take one by mouth in the am, one mid day and 2  at bedtime 360 capsule 3  ? metoprolol tartrate (LOPRESSOR) 25 MG tablet Take 1 tablet by mouth twice daily 180 tablet 2  ? omeprazole (PRILOSEC) 20 MG capsule Take 20 mg by mouth daily.    ? oxybutynin (OXYTROL) 3.9 MG/24HR Place 1 patch onto the skin 2 (two) times a week. 24 patch 3  ? pilocarpine (SALAGEN) 5 MG tablet Take 1 tablet (5 mg total) by mouth 3 (three) times daily. 270 tablet 3  ? ?No current facility-administered medications on file prior to visit.  ?  ?Review of Systems  ?Constitutional:  Negative for fatigue.  ?Gastrointestinal:  Positive for constipation and diarrhea.  ?Endocrine: Negative for polydipsia, polyphagia and polyuria.  ?Musculoskeletal:  Negative for arthralgias.  ?Hematological:  Negative for adenopathy.  ? ?   ?Objective:  ? Physical Exam ?Constitutional:   ?   General: She is not in acute distress. ?   Appearance: Normal appearance. She is well-developed. She is obese. She is not ill-appearing or diaphoretic.  ?HENT:  ?   Head: Normocephalic and atraumatic.  ?Eyes:  ?   Conjunctiva/sclera: Conjunctivae normal.  ?   Pupils: Pupils are equal, round, and reactive to light.  ?Neck:  ?   Thyroid: No thyromegaly.  ?   Vascular: No carotid bruit or JVD.  ?Cardiovascular:  ?   Rate and Rhythm: Normal rate and regular rhythm.  ?   Heart sounds: Normal heart sounds.  ?  No gallop.  ?Pulmonary:  ?   Effort: Pulmonary effort is normal. No respiratory distress.  ?   Breath sounds: Normal breath sounds. No wheezing or rales.  ?Abdominal:  ?   General: There is no distension or abdominal bruit.  ?   Palpations: Abdomen is soft.  ?Musculoskeletal:  ?   Cervical back: Normal range of motion and neck supple.  ?   Right lower leg: No edema.  ?   Left lower leg: No edema.  ?Lymphadenopathy:  ?   Cervical: No cervical adenopathy.   ?Skin: ?   General: Skin is warm and dry.  ?   Coloration: Skin is not pale.  ?   Findings: No rash.  ?   Comments: Fair  ?  ?Neurological:  ?   Mental Status: She is alert.  ?   Coordination: Coordin

## 2021-05-24 NOTE — Progress Notes (Signed)
? ?Chronic Care Management ?Pharmacy Note ? ?06/01/2021 ?Name:  Lauren [name] MRN:  [id number] DOB:  1945/08/04 ? ?Summary: CCM Initial visit ?-LDL 81 (03/2021), goal is < 70 given hx of stroke. Ezetimibe was added after most recent labwork, updated lipid panel needed to assess efficacy ?-Pt reports trouble sleeping. She is taking Duloxetine at night, we discussed the norepinephrine component may cause insomnia in some cases ?-Discussed donut hole/coverage gap. This would be the first year she deals with it as brand-name meds were started mid 2022 (Repatha, Eliquis). Pt will call prescriber if she runs into cost issues later in the year ? ?Recommendations/Changes made from today's visit: ?-Advised to move duloxetine to AM to help with sleep ?-Advised trial of melatonin 5-10 mg HS  ?-Recommend repeat lipid panel at next cardiology appt ? ?Plan: ?-[place] will call patient 4 months for general update ?-Pharmacist follow up televisit scheduled for 6 months ?-PCP annual visit 03/28/22; repeat A1c 08/2021 ? ? ? ?Subjective: ?Lauren [name] is an 76 y.o. year old female who is a primary patient of Tower, Wynelle Fanny, MD.  The CCM team was consulted for assistance with disease management and care coordination needs.   ? ?Engaged with patient face to face for initial visit in response to provider referral for pharmacy case management and/or care coordination services.  ? ?Consent to Services:  ?The patient was given the following information about Chronic Care Management services today, agreed to services, and gave verbal consent: 1. CCM service includes personalized support from designated clinical staff supervised by the primary care provider, including individualized plan of care and coordination with other care providers 2. 24/7 contact phone numbers for assistance for urgent and routine care needs. 3. Service will only be billed when office clinical staff spend 20 minutes or more in a month to coordinate care. 4.  Only one practitioner may furnish and bill the service in a calendar month. 5.The patient may stop CCM services at any time (effective at the end of the month) by phone call to the office staff. 6. The patient will be responsible for cost sharing (co-pay) of up to 20% of the service fee (after annual deductible is met). Patient agreed to services and consent obtained. ? ?Patient Care Team: ?Tower, Wynelle Fanny, MD as PCP - General ?Wellington Hampshire, MD as PCP - Cardiology (Cardiology) ?Sharol Roussel, OD as Physician Assistant (Optometry) ?Lorelee Cover., MD as Referring Physician (Ophthalmology) ?Laverle Hobby, MD as Consulting Physician (Pulmonary Disease) ?Marc Morgans, DMD, PA as Referring Physician (Dentistry) ?Eknoor Novack, Cleaster Corin, [facility] as Pharmacist (Pharmacist) ? ?Recent office visits: ?03/09/21 Lauren Pardon, MD Diarrhea No med changes.  ?02/19/21 Lauren Pardon, MD Annual Exam Change: Gabapentin 300 mg Take one in am, mid day and 2 at bedtime (vs 3 times daily).  ?02/15/21 AWV ? ?Recent consult visits: ?04/23/21 [facility] Traumatic Hematoma of right forearm. No med changes.  ?03/15/21 Jennings Books, MD (Neurology): Right carpal tunnel syndrome. Referral to Physical Therapy. ?03/05/21 [facility] Diarrhea Start: Zithromax 250 mg.  ?02/24/21 Jennings Books, MD (Neurology): Left carpal tunnel syndrome. Procedure: Carpal Tunnel injection. Administered: 0.75 ml__ Triamcinolone Acetonide (40 mg/ml) ?02/17/21 Jennings Books, MD (Neurology): Left carpal tunnel syndrome. Procedure: Carpal Tunnel injection. Administered: 0.75 ml__ Triamcinolone Acetonide (40 mg/ml) ?01/18/21 Audiology: Hearing test ?01/04/21 Mayur Posey Pronto, MD (Rheumatology): Sjogren's syndrome. F/U in 4 months.  ?12/22/20 Kathlyn Sacramento, MD (Cardiology): Permanent Atrial Fibrillation: EKG. No med changes.  ?12/18/20 Chesley Mires, MD (Pulmonology): Referral  for CPAP ?12/08/20 Karl Ito, NP (Pain Medicine): Bronchitis: Start: Zithromax  250 mg. Start: Tessalon 200 mg. Stop (change therapy): Ezetimibe 10 mg.  ? ?Hospital visits: ?None in previous 6 months ? ? ?Objective: ? ?Lab Results  ?Component Value Date  ? CREATININE 0.53 (L) 03/05/2021  ? BUN 9 03/05/2021  ? GFR 86.53 02/12/2021  ? EGFR 96 03/05/2021  ? GFRNONAA >60 06/15/2020  ? GFRAA 99 09/11/2019  ? NA 137 03/05/2021  ? K 4.2 03/05/2021  ? CALCIUM 7.5 (L) 03/05/2021  ? CO2 22 03/05/2021  ? GLUCOSE 105 (H) 03/05/2021  ? ? ?Lab Results  ?Component Value Date/Time  ? HGBA1C 6.1 (A) 05/24/2021 08:09 AM  ? HGBA1C 6.7 (H) 02/12/2021 09:56 AM  ? HGBA1C 6.1 (H) 06/14/2020 03:01 AM  ? GFR 86.53 02/12/2021 09:56 AM  ? GFR 87.48 02/12/2020 08:05 AM  ?  ?Last diabetic Eye exam: No results found for: HMDIABEYEEXA  ?Last diabetic Foot exam: No results found for: HMDIABFOOTEX  ? ?Lab Results  ?Component Value Date  ? CHOL 143 02/12/2021  ? HDL 46.40 02/12/2021  ? [place] 81 02/12/2021  ? LDLDIRECT 148.9 08/20/2012  ? TRIG 80.0 02/12/2021  ? CHOLHDL 3 02/12/2021  ? ? ? ?  Latest Ref Rng & Units 03/05/2021  ? 10:20 AM 02/12/2021  ?  9:56 AM 10/21/2020  ? 10:42 AM  ?Hepatic Function  ?Total Protein 6.0 - 8.5 g/dL 6.9   7.0   7.0    ?Albumin 3.7 - 4.7 g/dL 4.1   4.1   3.8    ?AST 0 - 40 IU/L _0 ?ALT 0 - 32 IU/L _1 ?Alk Phosphatase 44 - 121 IU/L 82   76   67    ?Total Bilirubin 0.0 - 1.2 mg/dL 0.4   0.4   0.7    ?Bilirubin, Direct 0.0 - 0.2 mg/dL   0.1    ? ? ?Lab Results  ?Component Value Date/Time  ? TSH 0.86 02/12/2021 09:56 AM  ? TSH 0.744 06/14/2020 09:49 AM  ? TSH 0.81 01/21/2019 08:52 AM  ? ? ? ?  Latest Ref Rng & Units 03/05/2021  ? 10:20 AM 02/12/2021  ?  9:56 AM 08/21/2020  ?  9:17 AM  ?CBC  ?WBC 3.4 - 10.8 x10E3/uL 8.3   8.1   7.5    ?Hemoglobin 11.1 - 15.9 g/dL 14.0   14.1   13.7    ?Hematocrit 34.0 - 46.6 % 43.3   43.1   45.0    ?Platelets 150 - 450 x10E3/uL 265   250.0   219    ? ? ?Lab Results  ?Component Value Date/Time  ? VD25OH 46.49 02/12/2021 09:56 AM  ? VD25OH 53.34  02/12/2020 08:05 AM  ? ? ?Clinical ASCVD: Yes  ?The ASCVD Risk score (Arnett DK, et al., 2019) failed to calculate for the following reasons: ?  The patient has a prior MI or stroke diagnosis   ? ? ?  02/15/2021  ?  2:51 PM 02/14/2020  ?  2:49 PM 01/21/2019  ? 12:03 PM  ?Depression screen PHQ 2/9  ?Decreased Interest 0 0 0  ?Down, Depressed, Hopeless 0 0 0  ?PHQ - 2 Score 0 0 0  ?Altered sleeping  0 0  ?Tired, decreased energy  0 0  ?Change in appetite  0 0  ?Feeling bad or failure about yourself   0  0  ?Trouble concentrating  0 0  ?Moving slowly or fidgety/restless  0 0  ?Suicidal thoughts  0 0  ?PHQ-9 Score  0 0  ?Difficult doing work/chores  Not difficult at all Not difficult at all  ?  ? ?CHA2DS2/VAS Stroke Risk Points  Current as of 9 minutes ago  ?   5 >= 2 Points: High Risk  ?1 - 1.99 Points: Medium Risk  ?0 Points: Low Risk  ?  No Change   ?  ? Points Metrics  ?0 Has Congestive Heart Failure:  No   ? Current as of 9 minutes ago  ?0 Has Vascular Disease:  No   ? Current as of 9 minutes ago  ?0 Has Hypertension:  No   ? Current as of 9 minutes ago  ?2 Age:  2   ? Current as of 9 minutes ago  ?0 Has Diabetes:  No   ? Current as of 9 minutes ago  ?2 Had Stroke:  Yes  Had TIA:  No  Had Thromboembolism:  No   ? Current as of 9 minutes ago  ?1 Female:  Yes   ? Current as of 9 minutes ago  ? ? ?Social History  ? ?Tobacco Use  ?Smoking Status Never  ?Smokeless Tobacco Never  ? ?BP Readings from Last 3 Encounters:  ?05/28/21 120/70  ?05/24/21 124/72  ?04/23/21 (!) 150/96  ? ?Pulse Readings from Last 3 Encounters:  ?05/28/21 75  ?05/24/21 68  ?04/23/21 (!) 105  ? ?Wt Readings from Last 3 Encounters:  ?05/28/21 257 lb (116.6 kg)  ?05/24/21 254 lb (115.2 kg)  ?02/19/21 259 lb 8 oz (117.7 kg)  ? ?BMI Readings from Last 3 Encounters:  ?05/28/21 39.66 kg/m?  ?05/24/21 39.19 kg/m?  ?02/19/21 40.04 kg/m?  ? ? ?Assessment/Interventions: Review of patient past medical history, allergies, medications, health status, including  review of consultants reports, laboratory and other test data, was performed as part of comprehensive evaluation and provision of chronic care management services.  ? ?SDOH:  (Social Determinants of Health) asses

## 2021-05-24 NOTE — Assessment & Plan Note (Signed)
Discussed how this problem influences overall health and the risks it imposes  Reviewed plan for weight loss with lower calorie diet (via better food choices and also portion control or program like weight watchers) and exercise building up to or more than 30 minutes 5 days per week including some aerobic activity    

## 2021-05-24 NOTE — Assessment & Plan Note (Addendum)
Improved ?Lab Results  ?Component Value Date  ? HGBA1C 6.1 (A) 05/24/2021  ? ?Down from 6.7  ?Enc her to continue the low glycemic diet  ?Adding exercise soon when pool opens  ?Re check a1c (lab only) in 3 mo  ? ? ? ?

## 2021-05-28 ENCOUNTER — Encounter: Payer: Self-pay | Admitting: Adult Health

## 2021-05-28 ENCOUNTER — Ambulatory Visit (INDEPENDENT_AMBULATORY_CARE_PROVIDER_SITE_OTHER): Payer: Medicare Other | Admitting: Adult Health

## 2021-05-28 DIAGNOSIS — E669 Obesity, unspecified: Secondary | ICD-10-CM

## 2021-05-28 DIAGNOSIS — G4733 Obstructive sleep apnea (adult) (pediatric): Secondary | ICD-10-CM

## 2021-05-28 NOTE — Patient Instructions (Signed)
Keep up the good work ?Continue on CPAP at bedtime ?Work on healthy weight ?Remain active ?Do not drive if sleepy ?Follow-up with Dr. Mortimer Fries in 1 year and As needed   ? ?

## 2021-05-28 NOTE — Assessment & Plan Note (Signed)
Healthy weight discussed  ?

## 2021-05-28 NOTE — Progress Notes (Signed)
? ?'@Patient'  ID: Lauren [name], female    DOB: 02-09-1945, 76 y.o.   MRN: [id number] ? ?Chief Complaint  ?Patient presents with  ? Follow-up  ? ? ?Referring provider: ?Tower, Wynelle Fanny, MD ? ?HPI: ?76 year old female followed for severe obstructive sleep apnea on CPAP  ? ?TEST/EVENTS :  ?PSG 10/19/2016 showed severe obstructive sleep apnea, AHI 76/hr with SPO2 low 84.5% ? ?05/28/2021 Follow up : OSA  ?Patient presents for a 1 year follow-up.  Patient has underlying obstructive sleep apnea.  She is on CPAP at bedtime.  Patient says she wears her CPAP every single night never misses any nights.  She typically gets in about 8 hours.  Patient says she feels rested with no significant daytime sleepiness.  Feels that she benefits from CPAP.  CPAP download shows 100% compliance with daily average usage at 8.5 hours.  Patient is on auto CPAP 13 to 16 cm H2O.  Daily average pressure at 15.3 cm H2O.  AHI is 1.4..  Mild mask leaks. ?CPAP is new from 01/2021. Uses full face mask.  ? ?Allergies  ?Allergen Reactions  ? Crestor [Rosuvastatin]   ?  myalgias  ? Lipitor [Atorvastatin]   ?  myalgias  ? Statins   ?  joint pain  ? Wasp Venom Swelling  ? ? ?Immunization History  ?Administered Date(s) Administered  ? HPV Bivalent 11/10/2016  ? Influenza Split 11/26/2010  ? Influenza Whole 12/06/2009  ? Influenza, High Dose Seasonal PF 12/31/2019, 11/02/2020  ? Influenza, Seasonal, Injecte, Preservative Fre 10/23/2015  ? Influenza,inj,Quad PF,6+ Mos 11/26/2013, 11/10/2014, 11/10/2016, 11/22/2017, 10/30/2018  ? Influenza-Unspecified 11/26/2013, 11/10/2014, 11/10/2016, 11/22/2017, 10/30/2018  ? PFIZER(Purple Top)SARS-COV-2 Vaccination 02/28/2019, 03/21/2019, 12/27/2019  ? Pneumococcal Conjugate-13 12/24/2014, 05/31/2016  ? Pneumococcal Polysaccharide-23 01/19/2010, 01/08/2018  ? Td 10/13/2006  ? Zoster Recombinat (Shingrix) 05/31/2016, 08/23/2016  ? Zoster, Live 02/07/2005  ? ? ?Past Medical History:  ?Diagnosis Date  ? Breast cancer ([place]) 2002  ?  DCIS; BRCA 1 pos HER2 pos, ER2 pos  ? DDD (degenerative disc disease)   ? DNR (do not resuscitate) 06/13/2020  ? GERD (gastroesophageal reflux disease)   ? History of echocardiogram   ? a. 06/2020 Echo: EF 55-60%, no rwma, nl RV size/fxn. Mildly dil LA.  ? HLD (hyperlipidemia)   ? statin intolerant  ? Hyperglycemia 10/2007  ? A1C elvated to 6.2  ? Obstructive sleep apnea   ? Osteoarthritis   ? Osteopenia 2007  ? ? from xray with nl dexa  ? Persistent atrial fibrillation ([place])   ? a. Dx 06/2020 in setting of stroke. CHA2DS2VASc = 5-->Eliquis; b. 08/2020 Successful DCCV; c. 09/2020 Office f/u- pt in rate-controlled Afib.  ? Reactive depression (situational)   ? very well controlled, and now takes prozac for sleep  ? Sjogren's syndrome ([place])   ? Stroke [facility]) 06/2020  ? Urine incontinence   ? Vitamin D deficiency   ? ? ?Tobacco History: ?Social History  ? ?Tobacco Use  ?Smoking Status Never  ?Smokeless Tobacco Never  ? ?Counseling given: Not Answered ? ? ?Outpatient Medications Prior to Visit  ?Medication Sig Dispense Refill  ? acetaminophen (TYLENOL) 500 MG tablet Take 1,000 mg by mouth every 6 (six) hours as needed for moderate pain.    ? apixaban (ELIQUIS) 5 MG TABS tablet Take 1 tablet by mouth twice daily 180 tablet 1  ? Carboxymethylcellul-Glycerin (LUBRICATING EYE DROPS OP) Place 1 drop into both eyes 5 (five) times daily.    ? cetirizine (ZYRTEC) 10 MG tablet Take  10 mg by mouth at bedtime.    ? DULoxetine (CYMBALTA) 30 MG capsule Take 1 capsule (30 mg total) by mouth daily. 90 capsule 3  ? EPINEPHrine 0.3 mg/0.3 mL IJ SOAJ injection Inject 0.3 mLs (0.3 mg total) into the muscle once. (Patient taking differently: Inject 0.3 mg into the muscle as needed for anaphylaxis.) 1 Device 5  ? Evolocumab (REPATHA SURECLICK) 794 MG/ML SOAJ Inject 140 mg into the skin every 14 (fourteen) days. 2 mL 11  ? ezetimibe (ZETIA) 10 MG tablet Take 1 tablet (10 mg total) by mouth daily. 90 tablet 3  ? Fish Oil-Cholecalciferol (OMEGA-3 +  VITAMIN D3 PO) Take 2 capsules by mouth daily.     ? fluticasone (FLONASE) 50 MCG/ACT nasal spray Place into both nostrils daily as needed for allergies or rhinitis.    ? gabapentin (NEURONTIN) 300 MG capsule Take one by mouth in the am, one mid day and 2 at bedtime 360 capsule 3  ? metoprolol tartrate (LOPRESSOR) 25 MG tablet Take 1 tablet by mouth twice daily 180 tablet 2  ? omeprazole (PRILOSEC) 20 MG capsule Take 20 mg by mouth daily.    ? oxybutynin (OXYTROL) 3.9 MG/24HR Place 1 patch onto the skin 2 (two) times a week. 24 patch 3  ? pilocarpine (SALAGEN) 5 MG tablet Take 1 tablet (5 mg total) by mouth 3 (three) times daily. 270 tablet 3  ? vitamin B-12 (CYANOCOBALAMIN) 1000 MCG tablet Take 1,000 mcg by mouth daily.    ? vitamin C (ASCORBIC ACID) 500 MG tablet Take 500 mg by mouth daily.    ? zinc gluconate 50 MG tablet Take 50 mg by mouth daily.    ? ?No facility-administered medications prior to visit.  ? ? ? ?Review of Systems:  ? ?Constitutional:   No  weight loss, night sweats,  Fevers, chills, fatigue, or  lassitude. ? ?HEENT:   No headaches,  Difficulty swallowing,  Tooth/dental problems, or  Sore throat,  ?              No sneezing, itching, ear ache, nasal congestion, post nasal drip,  ? ?CV:  No chest pain,  Orthopnea, PND, swelling in lower extremities, anasarca, dizziness, palpitations, syncope.  ? ?GI  No heartburn, indigestion, abdominal pain, nausea, vomiting, diarrhea, change in bowel habits, loss of appetite, bloody stools.  ? ?Resp: No shortness of breath with exertion or at rest.  No excess mucus, no productive cough,  No non-productive cough,  No coughing up of blood.  No change in color of mucus.  No wheezing.  No chest wall deformity ? ?Skin: no rash or lesions. ? ?GU: no dysuria, change in color of urine, no urgency or frequency.  No flank pain, no hematuria  ? ?MS:  No joint pain or swelling.  No decreased range of motion.  No back pain. ? ? ? ?Physical Exam ? ?BP 120/70 (BP Location:  Right Arm, Cuff Size: Large)   Pulse 75   Temp 97.9 ?F (36.6 ?C) (Temporal)   Ht 5' 7.5" (1.715 m)   Wt 257 lb (116.6 kg)   SpO2 96%   BMI 39.66 kg/m?  ? ?GEN: A/Ox3; pleasant , NAD, well nourished  ?  ?HEENT:  [place]/AT,  NOSE-clear, THROAT-clear, no lesions, no postnasal drip or exudate noted.  ? ?NECK:  Supple w/ fair ROM; no JVD; normal carotid impulses w/o bruits; no thyromegaly or nodules palpated; no lymphadenopathy.   ? ?RESP  Clear  P & A; w/o,  wheezes/ rales/ or rhonchi. no accessory muscle use, no dullness to percussion ? ?CARD:  RRR, no m/r/g, no peripheral edema, pulses intact, no cyanosis or clubbing. ? ?GI:   Soft & nt; nml bowel sounds; no organomegaly or masses detected.  ? ?Musco: Warm bil, no deformities or joint swelling noted.  ? ?Neuro: alert, no focal deficits noted.   ? ?Skin: Warm, no lesions or rashes ? ? ? ?Lab Results: ? ?CBC ? ? ? ?BNP ?No results found for: BNP ? ?ProBNP ?No results found for: PROBNP ? ?Imaging: ?No results found. ? ? ? ?   ? View : No data to display.  ?  ?  ?  ? ? ?No results found for: NITRICOXIDE ? ? ? ? ? ?Assessment & Plan:  ? ?OSA (obstructive sleep apnea) ?Excellent control compliance on nocturnal CPAP ? ?Plan  ? ?Patient Instructions  ?Keep up the good work ?Continue on CPAP at bedtime ?Work on healthy weight ?Remain active ?Do not drive if sleepy ?Follow-up with Dr. Mortimer Fries in 1 year and As needed   ? ?  ? ? ?Obesity (BMI 30-39.9) ?Healthy weight discussed  ? ? ? ? ?Rexene Edison, NP ?05/28/2021 ? ?

## 2021-05-28 NOTE — Assessment & Plan Note (Signed)
Excellent control compliance on nocturnal CPAP ? ?Plan  ? ?Patient Instructions  ?Keep up the good work ?Continue on CPAP at bedtime ?Work on healthy weight ?Remain active ?Do not drive if sleepy ?Follow-up with Dr. Mortimer Fries in 1 year and As needed   ? ?  ? ?

## 2021-06-01 DIAGNOSIS — M3509 Sicca syndrome with other organ involvement: Secondary | ICD-10-CM | POA: Diagnosis not present

## 2021-06-01 DIAGNOSIS — M791 Myalgia, unspecified site: Secondary | ICD-10-CM | POA: Diagnosis not present

## 2021-06-01 DIAGNOSIS — M159 Polyosteoarthritis, unspecified: Secondary | ICD-10-CM | POA: Diagnosis not present

## 2021-06-01 NOTE — Patient Instructions (Signed)
Visit Information ? ?Phone number for Pharmacist: [phone] ? ?Thank you for meeting with me to discuss your medications! I look forward to working with you to achieve your health care goals. Below is a summary of what we talked about during the visit: ? ? Goals Addressed   ?None ?  ? ? ?Care Plan : Pharmacy Care Plan  ?Updates made by Charlton Haws, [place] since 06/01/2021 12:00 AM  ?  ? ?Problem: Hyperlipidemia, Atrial Fibrillation, and Overactive Bladder, Sjorgen's, Hx stroke   ?Priority: High  ?  ? ?Long-Range Goal: Disease mgmt   ?Start Date: 05/24/2021  ?Expected End Date: 06/02/2022  ?This Visit's Progress: On track  ?Priority: High  ?Note:   ?Current Barriers:  ?LDL above goal ? ?Pharmacist Clinical Goal(s):  ?Patient will contact provider office for questions/concerns as evidenced notation of same in electronic health record through collaboration with PharmD and provider.  ? ?Interventions: ?1:1 collaboration with Tower, Wynelle Fanny, MD regarding development and update of comprehensive plan of care as evidenced by provider attestation and co-signature ?Inter-disciplinary care team collaboration (see longitudinal plan of care) ?Comprehensive medication review performed; medication list updated in electronic medical record ? ?Hyperlipidemia: (LDL goal < 70) ?-Query controlled - LDL 81 (02/2021), ezetimibe was added afterwards and lipids have not been rechecked yet;  ?-Hx embolic stroke 03/5850. Repatha started 08/2020, Ezetimibe started 02/2021 ?-Current treatment: ?Repatha 140 mg q14 days - Appropriate, Query Effective ?Ezetimibe 10 mg daily -Appropriate, Query Effective ?OTC Fish Oil + Vitamin D -Appropriate, Effective, Safe, Accessible ?-Medications previously tried: rosuvastatin, atorvastatin  ?-Educated on Cholesterol goals; Exercise goal of 150 minutes per week; ?-Recommended to continue current medication; repeat lipid panel with cardiology ? ?Atrial Fibrillation (Goal: prevent stroke and major  bleeding) ?-Controlled -  pt reports Eliquis cost has not been an issue; she denies s/sx bleeding ?-CHADSVASC: 5; hx embolic stroke 08/7822. Successful cardioversion July 2022.  ?-Current treatment: ?Eliquis 5 mg BID - Appropriate, Effective, Safe, Accessible ?Metoprolol tartrate 25 mg BID - Appropriate, Effective, Safe, Accessible ?-Medications previously tried: n/a ?-Counseled on increased risk of stroke due to Afib and benefits of anticoagulation for stroke prevention; importance of adherence to anticoagulant exactly as prescribed; bleeding risk associated with Eliquis and importance of self-monitoring for signs/symptoms of bleeding; ?avoidance of NSAIDs due to increased bleeding risk with anticoagulants; seeking medical attention after a head injury or if there is blood in the urine/stool; ?-Recommended to continue current medication ? ?Prediabetes (Goal: A1c < 6.5%) ?-Controlled - A1c 6.1% (05/2021) ?-Counseled on diet and exercise extensively ? ?Chronic pain (Goal: manage symptoms) ?-Controlled - although pt reports trouble sleeping, she is taking duloxetine at night, norephineprine activity can lead to insomnia in some cases ?-Sjorgen's syndrome. Follows with Rheumatology ?-Current treatment  ?Duloxetine 30 mg daily PM- Appropriate, Effective, Safe, Accessible ?Tylenol 500 mg PRN -Appropriate, Effective, Safe, Accessible ?Gabapentin 300 mg - 1 AM, 1 PM, 2 HS -Appropriate, Effective, Safe, Accessible ?-Medications previously tried: n/a  ?-Recommended to continue current medication; advised to move duloxetine to AM and try melatonin at bedtime to help with sleep ? ?GERD (Goal: manage symptoms) ?-Controlled ?-Current treatment  ?Omeprazole 20 mg daily - Appropriate, Effective, Safe, Accessible ?-Medications previously tried: n/a  ?-Recommended to continue current medication ? ?OAB (Goal: manage symptoms) ?-Controlled - pt reports significant improvement on OTC patch, this is not covered by her insurance but she is  willing to buy it ?-Current treatment  ?Oxybutynin patch 3.9 mg/24hr 2x weekly - Appropriate, Effective, Safe, Accessible ?-Medications  previously tried: n/a  ?-Notably, pt has Sjorgens and chronic issues with dry mouth/dry eye; discussed oxybutynin can worsen these issues, however she is tolerating reasonably well right now and agrees benefits > risks ?-Recommended to continue current medication ? ?Health Maintenance ?-Vaccine gaps: none ?-Hx Sjorgens - taking pilocarpine consistently which helps a lot per patient ?-Discussed insurance coverage gap (donut hole) - this is the first year she is on 2 brand-name meds (Eliquis and Repatha) and she will likely enter donut hole for first time this year, explained what this is and that it will end at end of year and revert back to regular copays January 1; pt voiced understanding and will contact prescribers if she runs into significant cost issues ?-Current therapy:  ?Cetirizine 10 mg ?Epi-Pen PRN ?Pilocarpine 5 mg TID (dry mouth - Sjorgens) ?Lubcricating eye drops ?-Patient is satisfied with current therapy and denies issues ?-Recommended to continue current medication ? ?Patient Goals/Self-Care Activities ?Patient will:  ?- take medications as prescribed as evidenced by patient report and record review ?focus on medication adherence by pill box ?collaborate with provider on medication access solutions ? ?  ?  ? ?Ms. Considine was given information about Chronic Care Management services today including:  ?CCM service includes personalized support from designated clinical staff supervised by her physician, including individualized plan of care and coordination with other care providers ?24/7 contact phone numbers for assistance for urgent and routine care needs. ?Standard insurance, coinsurance, copays and deductibles apply for chronic care management only during months in which we provide at least 20 minutes of these services. Most insurances cover these services at 100%,  however patients may be responsible for any copay, coinsurance and/or deductible if applicable. This service may help you avoid the need for more expensive face-to-face services. ?Only one practitioner may furnish and bill the service in a calendar month. ?The patient may stop CCM services at any time (effective at the end of the month) by phone call to the office staff. ? ?Patient agreed to services and verbal consent obtained.  ? ?Patient verbalizes understanding of instructions and care plan provided today and agrees to view in [place]. Active MyChart status confirmed with patient.   ?Telephone follow up appointment with pharmacy team member scheduled for: 6 months ? ?[name], PharmD, BCACP ?Clinical Pharmacist ?[facility] ?[phone]  ?

## 2021-06-06 DIAGNOSIS — I4821 Permanent atrial fibrillation: Secondary | ICD-10-CM

## 2021-06-06 DIAGNOSIS — E7800 Pure hypercholesterolemia, unspecified: Secondary | ICD-10-CM | POA: Diagnosis not present

## 2021-06-14 DIAGNOSIS — Z20822 Contact with and (suspected) exposure to covid-19: Secondary | ICD-10-CM | POA: Diagnosis not present

## 2021-06-16 ENCOUNTER — Telehealth: Payer: Self-pay | Admitting: Family Medicine

## 2021-06-16 ENCOUNTER — Encounter: Payer: Self-pay | Admitting: Family Medicine

## 2021-06-16 NOTE — Telephone Encounter (Signed)
Pt states "she has never had a spider bite since she got on her new heart burn medication: metoprolol tartrate (LOPRESSOR) 25 MG tablet ?"Wants to know if she needs to go see a doctor just to be safe." Would like to speak with the nurse. ? ?Callback Number: [phone] ? ? ?

## 2021-06-16 NOTE — Telephone Encounter (Signed)
Spoke with patient she was at the out of town [place] on Monday and was bitten by a spider , she said that when to pharmacy and spoke with pharmacist they recommended a  triple antibiotic cream for her  to use. Pt has be using this cream has helped  , she was concerns because she was taking medication for A-fid and having a-fid  with spider will get into her blood stream. She said that hasn't  any fever , it hasn't change in size has red ring around it. No drainage, doesn't feel hot/warm to touch. Pt  will send a picture though her my acct. She wants to know if this something she needs to be concerned about. Please advise.   ?

## 2021-06-16 NOTE — Telephone Encounter (Signed)
Addressed via mychart

## 2021-06-16 NOTE — Telephone Encounter (Signed)
Pt uploaded pic of bite, see mychart message. ?

## 2021-06-16 NOTE — Telephone Encounter (Signed)
Please see the MyChart message reply(ies) for my assessment and plan. ? ?The patient gave consent for this Medical Advice Message and is aware that it may result in a bill to their insurance company as well as the possibility that this may result in a co-payment or deductible. They are an established patient, but are not seeking medical advice exclusively about a problem treated during an in person or video visit in the last 7 days. I did not recommend an in person or video visit within 7 days of my reply. ? ?I spent a total of 8 minutes cumulative time within 7 days through [employer] ?Loura Pardon, MD ? ? ? ? ? ?Message from pt phone : ? ?Spoke with patient she was at the out of town [place] on Monday and was bitten by a spider , she said that when to pharmacy and spoke with pharmacist they recommended a  triple antibiotic cream for her  to use. Pt has be using this cream has helped  , she was concerns because she was taking medication for A-fid and having a-fid  with spider will get into her blood stream. She said that hasn't  any fever , it hasn't change in size has red ring around it. No drainage, doesn't feel hot/warm to touch. Pt  will send a picture though her my acct. She wants to know if this something she needs to be concerned about. Please advise.  ? ?Picture evaluated/see the media tab ?

## 2021-06-16 NOTE — Telephone Encounter (Signed)
The metoprolol is for her afib (not heart burn) ?She takes omeprazole for her GERD/heartburn ? ?What does she mean by her message ?Does she have a bite? ? ?Thanks  ?

## 2021-06-21 ENCOUNTER — Encounter: Payer: Self-pay | Admitting: Medical

## 2021-06-21 ENCOUNTER — Ambulatory Visit (INDEPENDENT_AMBULATORY_CARE_PROVIDER_SITE_OTHER): Payer: Medicare Other | Admitting: Medical

## 2021-06-21 VITALS — BP 110/78 | HR 96 | Ht 68.0 in | Wt 253.4 lb

## 2021-06-21 DIAGNOSIS — I4821 Permanent atrial fibrillation: Secondary | ICD-10-CM | POA: Diagnosis not present

## 2021-06-21 DIAGNOSIS — I639 Cerebral infarction, unspecified: Secondary | ICD-10-CM | POA: Diagnosis not present

## 2021-06-21 DIAGNOSIS — E782 Mixed hyperlipidemia: Secondary | ICD-10-CM | POA: Diagnosis not present

## 2021-06-21 DIAGNOSIS — S40021A Contusion of right upper arm, initial encounter: Secondary | ICD-10-CM | POA: Diagnosis not present

## 2021-06-21 DIAGNOSIS — G4733 Obstructive sleep apnea (adult) (pediatric): Secondary | ICD-10-CM

## 2021-06-21 NOTE — Patient Instructions (Signed)
Medication Instructions:  ?Your physician recommends that you continue on your current medications as directed. Please refer to the Current Medication list given to you today. ? ?*If you need a refill on your cardiac medications before your next appointment, please call your pharmacy* ? ? ?Lab Work: ?None ordered ?If you have labs (blood work) drawn today and your tests are completely normal, you will receive your results only by: ?MyChart Message (if you have MyChart) OR ?A paper copy in the mail ?If you have any lab test that is abnormal or we need to change your treatment, we will call you to review the results. ? ? ?Testing/Procedures: ?Your physician has requested that you have a upper extremity venous duplex. This test is an ultrasound of the veins in the arms. It looks at venous blood flow that carries blood from the heart to the arms. Allow one hour for a Lower Venous exam. Allow thirty minutes for an Upper Venous exam. There are no restrictions or special instructions. ?  ? ? ?Follow-Up: ?At [facility], you and your health needs are our priority.  As part of our continuing mission to provide you with exceptional heart care, we have created designated Provider Care Teams.  These Care Teams include your primary Cardiologist (physician) and Advanced Practice Providers (APPs -  Physician Assistants and Nurse Practitioners) who all work together to provide you with the care you need, when you need it. ? ?We recommend signing up for the patient portal called "MyChart".  Sign up information is provided on this After Visit Summary.  MyChart is used to connect with patients for Virtual Visits (Telemedicine).  Patients are able to view lab/test results, encounter notes, upcoming appointments, etc.  Non-urgent messages can be sent to your provider as well.   ?To learn more about what you can do with MyChart, go to [email or web address].   ? ?Your next appointment:   ?6 month(s) ? ?The format for your next  appointment:   ?In Person ? ?Provider:   ?You may see Kathlyn Sacramento, MD or one of the following Advanced Practice Providers on your designated Care Team:   ?Murray Hodgkins, NP ?Christell Faith, PA-C ?Cadence Kathlen Mody, PA-C{ ? ? ? ?Other Instructions ?N/A ? ?Important Information About Sugar ? ? ? ? ? ? ?

## 2021-06-21 NOTE — Progress Notes (Signed)
?Cardiology Office Note:   ? ?Date:  06/22/2021  ? ?ID:  Lauren [name], DOB Jan 10, 1946, MRN [id number] ? ?PCP:  Abner Greenspan, MD  ?[facility] Cardiologist:  Kathlyn Sacramento, MD  ?[place] Electrophysiologist:  None  ? ?Referring MD: Abner Greenspan, MD  ? ?Chief Complaint: 6 month follow-up ? ?History of Present Illness:   ? ?Lauren [name] is a 76 y.o. female with a hx of breast cancer status post bilateral mastectomies, stroke in the setting of permanent atrial fibrillation, hyperlipidemia with intolerance to statins, borderline diabetes, osteopenia, reactive depression, Sjogren's syndrome, urinary incontinence, obstructive sleep apnea, and vitamin D deficiency, presents for 6 month follow-up. ? ?She was hospitalized in May 2022 with acute onset of slurred speech, facial droop, and aphasia.  MRI confirmed stroke.  EKG showed atrial fibrillation.  She was started on Eliquis and beta-blocker therapy with good rate control.  Echocardiogram showed normal LV systolic function without significant valvular abnormalities.  The left atrium was mildly dilated.  On further questioning, she reported palpitations dating back to at least February 2022 and was subsequently told that she had an irregular heart rhythm by pulmonology in March 2022.  Metoprolol was increased to 25 mg twice daily at outpatient follow-up on May 19, and she remained in rate controlled atrial fibrillation at office follow-up on July 15.  She was not experiencing any symptoms related atrial fibrillation.  Decision was made to pursue cardioversion, which was successfully performed on July 18. The patient was seen in follow-up 09/21/20 and was back in Afib, plan for rate control.  ? ?Today, the patient has been doing well since the last visit. EKG shows Afib HR 96bom. She denies chest pain, SOB, palpitations. She has occasional lower leg edema that is dependent. She is on Eliquis. She had a mechanical fall in March 2023 with a subsequent hematoma.  She has moderate sized lump on her right extremity. She denies pain or tenderness. Pulse is normal. No orthopnea or pnd.  ? ?Past Medical History:  ?Diagnosis Date  ? Breast cancer ([place]) 2002  ? DCIS; BRCA 1 pos HER2 pos, ER2 pos  ? DDD (degenerative disc disease)   ? DNR (do not resuscitate) 06/13/2020  ? GERD (gastroesophageal reflux disease)   ? History of echocardiogram   ? a. 06/2020 Echo: EF 55-60%, no rwma, nl RV size/fxn. Mildly dil LA.  ? HLD (hyperlipidemia)   ? statin intolerant  ? Hyperglycemia 10/2007  ? A1C elvated to 6.2  ? Obstructive sleep apnea   ? Osteoarthritis   ? Osteopenia 2007  ? ? from xray with nl dexa  ? Persistent atrial fibrillation ([place])   ? a. Dx 06/2020 in setting of stroke. CHA2DS2VASc = 5-->Eliquis; b. 08/2020 Successful DCCV; c. 09/2020 Office f/u- pt in rate-controlled Afib.  ? Reactive depression (situational)   ? very well controlled, and now takes prozac for sleep  ? Sjogren's syndrome ([place])   ? Stroke [facility]) 06/2020  ? Urine incontinence   ? Vitamin D deficiency   ? ? ?Past Surgical History:  ?Procedure Laterality Date  ? BILATERAL OOPHORECTOMY  205  ? CARDIOVERSION N/A 08/24/2020  ? Procedure: CARDIOVERSION;  Surgeon: Wellington Hampshire, MD;  Location: [facility] ORS;  Service: Cardiovascular;  Laterality: N/A;  ? CATARACT EXTRACTION, BILATERAL    ? 10/2019, 12/2019  ? COLONOSCOPY  5/07  ? normal; recheck 3 years  ? [place] SURGERY  1991  ? Ruptured disk L5  ? MASTECTOMY  2001  ?  bilateral  ? TONSILLECTOMY  1974  ? TOTAL HIP ARTHROPLASTY  2011  ? TOTAL KNEE ARTHROPLASTY  2006  ? bilateral  ? ? ?Current Medications: ?Current Meds  ?Medication Sig  ? acetaminophen (TYLENOL) 500 MG tablet Take 1,000 mg by mouth every 6 (six) hours as needed for moderate pain.  ? apixaban (ELIQUIS) 5 MG TABS tablet Take 1 tablet by mouth twice daily  ? Carboxymethylcellul-Glycerin (LUBRICATING EYE DROPS OP) Place 1 drop into both eyes 5 (five) times daily.  ? cetirizine (ZYRTEC) 10 MG tablet Take 10 mg by  mouth at bedtime.  ? DULoxetine (CYMBALTA) 30 MG capsule Take 1 capsule (30 mg total) by mouth daily.  ? EPINEPHrine 0.3 mg/0.3 mL IJ SOAJ injection Inject 0.3 mLs (0.3 mg total) into the muscle once. (Patient taking differently: Inject 0.3 mg into the muscle as needed for anaphylaxis.)  ? Evolocumab (REPATHA SURECLICK) 767 MG/ML SOAJ Inject 140 mg into the skin every 14 (fourteen) days.  ? ezetimibe (ZETIA) 10 MG tablet Take 1 tablet (10 mg total) by mouth daily.  ? Fish Oil-Cholecalciferol (OMEGA-3 + VITAMIN D3 PO) Take 2 capsules by mouth daily.   ? fluticasone (FLONASE) 50 MCG/ACT nasal spray Place into both nostrils daily as needed for allergies or rhinitis.  ? gabapentin (NEURONTIN) 300 MG capsule Take one by mouth in the am, one mid day and 2 at bedtime  ? metoprolol tartrate (LOPRESSOR) 25 MG tablet Take 1 tablet by mouth twice daily  ? omeprazole (PRILOSEC) 20 MG capsule Take 20 mg by mouth daily.  ? oxybutynin (OXYTROL) 3.9 MG/24HR Place 1 patch onto the skin 2 (two) times a week.  ? pilocarpine (SALAGEN) 5 MG tablet Take 1 tablet (5 mg total) by mouth 3 (three) times daily.  ? vitamin B-12 (CYANOCOBALAMIN) 1000 MCG tablet Take 1,000 mcg by mouth daily.  ? vitamin C (ASCORBIC ACID) 500 MG tablet Take 500 mg by mouth daily.  ? zinc gluconate 50 MG tablet Take 50 mg by mouth daily.  ?  ? ?Allergies:   Crestor [rosuvastatin], Lipitor [atorvastatin], Statins, and Wasp venom  ? ?Social History  ? ?Socioeconomic History  ? Marital status: Widowed  ?  Spouse name: Not on file  ? Number of children: 3  ? Years of education: Not on file  ? Highest education level: Not on file  ?Occupational History  ? Occupation: Retired  ?Tobacco Use  ? Smoking status: Never  ? Smokeless tobacco: Never  ?Vaping Use  ? Vaping Use: Never used  ?Substance and Sexual Activity  ? Alcohol use: Not Currently  ?  Alcohol/week: 0.0 standard drinks  ?  Comment: rare, none in 2 years  ? Drug use: No  ? Sexual activity: Not Currently  ?Other  Topics Concern  ? Not on file  ?Social History Narrative  ? Retired [employer] degree  ?   ? Moved from [place]  ?   ? Divorced  ?   ? G3P3  ?   ? Regular exercise-yes  ? ?Social Determinants of Health  ? ?Financial Resource Strain: Low Risk   ? Difficulty of Paying Living Expenses: Not hard at all  ?Food Insecurity: No Food Insecurity  ? Worried About Charity fundraiser in the Last Year: Never true  ? Ran Out of Food in the Last Year: Never true  ?Transportation Needs: No Transportation Needs  ? Lack of Transportation (Medical): No  ? Lack of Transportation (Non-Medical): No  ?Physical Activity: Insufficiently Active  ?  Days of Exercise per Week: 4 days  ? Minutes of Exercise per Session: 20 min  ?Stress: No Stress Concern Present  ? Feeling of Stress : Not at all  ?Social Connections: Moderately Integrated  ? Frequency of Communication with Friends and Family: More than three times a week  ? Frequency of Social Gatherings with Friends and Family: More than three times a week  ? Attends Religious Services: More than 4 times per year  ? Active Member of Clubs or Organizations: Yes  ? Attends Archivist Meetings: More than 4 times per year  ? Marital Status: Widowed  ?  ? ?Family History: ?The patient's family history includes Arthritis in an other family member; Brain cancer in her brother; Breast cancer in an other family member; Dementia in her mother; Diabetes in an other family member; Hyperlipidemia in an other family member; Hypertension in an other family member; Other in her daughter; Ovarian cancer in an other family member; Prostate cancer in an other family member; Psoriasis in her brother; Stroke in her maternal grandmother. ? ?ROS:   ?Please see the history of present illness.    ? All other systems reviewed and are negative. ? ?EKGs/Labs/Other Studies Reviewed:   ? ?The following studies were reviewed today: ? ?Echo 06/2020 ? ? 1. Left ventricular ejection fraction, by estimation, is  55 to 60%. The  ?left ventricle has normal function. The left ventricle has no regional  ?wall motion abnormalities. Left ventricular diastolic function could not  ?be evaluated.  ? 2. Right ventricular

## 2021-06-27 ENCOUNTER — Other Ambulatory Visit: Payer: Self-pay | Admitting: Cardiovascular Disease

## 2021-06-28 NOTE — Telephone Encounter (Signed)
Refill Request.  

## 2021-06-28 NOTE — Telephone Encounter (Signed)
Prescription refill request for Eliquis received. Indication: Atrial Fib Last office visit: 06/21/21  C Furth PA-C Scr: 0.53 on 03/05/21 Age: 76 Weight: 114.9kg  Based on above findings Eliquis '5mg'$  twice daily is the appropriate dose.  Refill approved.

## 2021-07-08 DIAGNOSIS — G5603 Carpal tunnel syndrome, bilateral upper limbs: Secondary | ICD-10-CM | POA: Diagnosis not present

## 2021-07-15 DIAGNOSIS — G5603 Carpal tunnel syndrome, bilateral upper limbs: Secondary | ICD-10-CM | POA: Diagnosis not present

## 2021-07-16 ENCOUNTER — Other Ambulatory Visit: Payer: Self-pay | Admitting: Cardiovascular Disease

## 2021-07-21 DIAGNOSIS — L565 Disseminated superficial actinic porokeratosis (DSAP): Secondary | ICD-10-CM | POA: Diagnosis not present

## 2021-07-21 DIAGNOSIS — D225 Melanocytic nevi of trunk: Secondary | ICD-10-CM | POA: Diagnosis not present

## 2021-07-21 DIAGNOSIS — D2272 Melanocytic nevi of left lower limb, including hip: Secondary | ICD-10-CM | POA: Diagnosis not present

## 2021-07-21 DIAGNOSIS — L821 Other seborrheic keratosis: Secondary | ICD-10-CM | POA: Diagnosis not present

## 2021-07-21 DIAGNOSIS — D2261 Melanocytic nevi of right upper limb, including shoulder: Secondary | ICD-10-CM | POA: Diagnosis not present

## 2021-07-21 DIAGNOSIS — L538 Other specified erythematous conditions: Secondary | ICD-10-CM | POA: Diagnosis not present

## 2021-07-21 DIAGNOSIS — D2262 Melanocytic nevi of left upper limb, including shoulder: Secondary | ICD-10-CM | POA: Diagnosis not present

## 2021-07-21 DIAGNOSIS — B078 Other viral warts: Secondary | ICD-10-CM | POA: Diagnosis not present

## 2021-07-21 DIAGNOSIS — D2271 Melanocytic nevi of right lower limb, including hip: Secondary | ICD-10-CM | POA: Diagnosis not present

## 2021-07-23 ENCOUNTER — Ambulatory Visit (INDEPENDENT_AMBULATORY_CARE_PROVIDER_SITE_OTHER): Payer: Medicare Other

## 2021-07-23 DIAGNOSIS — S40021D Contusion of right upper arm, subsequent encounter: Secondary | ICD-10-CM

## 2021-07-23 DIAGNOSIS — S40021A Contusion of right upper arm, initial encounter: Secondary | ICD-10-CM

## 2021-08-22 ENCOUNTER — Telehealth: Payer: Self-pay | Admitting: Family Medicine

## 2021-08-22 DIAGNOSIS — R7303 Prediabetes: Secondary | ICD-10-CM

## 2021-08-22 NOTE — Telephone Encounter (Signed)
-----   Message from Ellamae Sia sent at 08/09/2021  2:35 PM EDT ----- Regarding: Lab orders for Monday, 7.17.23 Lab orders for a1c, thanks

## 2021-08-23 ENCOUNTER — Ambulatory Visit (INDEPENDENT_AMBULATORY_CARE_PROVIDER_SITE_OTHER): Payer: Medicare Other | Admitting: Family

## 2021-08-23 ENCOUNTER — Other Ambulatory Visit (INDEPENDENT_AMBULATORY_CARE_PROVIDER_SITE_OTHER): Payer: Medicare Other

## 2021-08-23 ENCOUNTER — Encounter: Payer: Self-pay | Admitting: Family

## 2021-08-23 VITALS — BP 110/68 | HR 80 | Temp 98.3°F | Resp 16 | Ht 68.0 in | Wt 250.2 lb

## 2021-08-23 DIAGNOSIS — T22131A Burn of first degree of right upper arm, initial encounter: Secondary | ICD-10-CM | POA: Insufficient documentation

## 2021-08-23 DIAGNOSIS — S80861A Insect bite (nonvenomous), right lower leg, initial encounter: Secondary | ICD-10-CM

## 2021-08-23 DIAGNOSIS — L03115 Cellulitis of right lower limb: Secondary | ICD-10-CM | POA: Insufficient documentation

## 2021-08-23 DIAGNOSIS — X101XXA Contact with hot food, initial encounter: Secondary | ICD-10-CM | POA: Diagnosis not present

## 2021-08-23 DIAGNOSIS — R7303 Prediabetes: Secondary | ICD-10-CM | POA: Diagnosis not present

## 2021-08-23 LAB — POCT GLYCOSYLATED HEMOGLOBIN (HGB A1C): Hemoglobin A1C: 6.5 % — AB (ref 4.0–5.6)

## 2021-08-23 MED ORDER — TRIAMCINOLONE ACETONIDE 0.1 % EX CREA: 1.0000 | TOPICAL_CREAM | Freq: Two times a day (BID) | CUTANEOUS | 0 refills | Status: AC

## 2021-08-23 MED ORDER — SILVER SULFADIAZINE 1 % EX CREA: 1.0000 | TOPICAL_CREAM | Freq: Every day | CUTANEOUS | 0 refills | Status: DC

## 2021-08-23 MED ORDER — DOXYCYCLINE HYCLATE 100 MG PO TABS: 100.0000 mg | ORAL_TABLET | Freq: Two times a day (BID) | ORAL | 0 refills | Status: AC

## 2021-08-23 NOTE — Assessment & Plan Note (Signed)
rx silvadene Use as needed F/u if any s/s infection

## 2021-08-23 NOTE — Assessment & Plan Note (Signed)
rx doycycline 100 mg rx triamcinolone 0.5 % prn itch Monitor daily for s/s infection  F/u if no improvement

## 2021-08-23 NOTE — Progress Notes (Signed)
Established Patient Office Visit  Subjective:  Patient ID: Lauren [name], female    DOB: [birth year]  Age: 76 y.o. MRN: [id number]  CC:  Chief Complaint  Patient presents with   Insect Bite    Horse fly X 1 week    HPI Lauren [name] is here today with concerns.   Bit by a horse fly about one week ago.  The bite was on the right side of the lateral lower leg.  Does itch at times, getting more red at times.   Also burned herself a bit on her right inner forearm, was canning pickles and burned herself. Getting slightly better but still hanging around. She used silvadene in the past which was helpful.   Past Medical History:  Diagnosis Date   Breast cancer ([place]) 2002   DCIS; BRCA 1 pos HER2 pos, ER2 pos   DDD (degenerative disc disease)    DNR (do not resuscitate) 06/13/2020   GERD (gastroesophageal reflux disease)    History of echocardiogram    a. 06/2020 Echo: EF 55-60%, no rwma, nl RV size/fxn. Mildly dil LA.   HLD (hyperlipidemia)    statin intolerant   Hyperglycemia 10/2007   A1C elvated to 6.2   Obstructive sleep apnea    Osteoarthritis    Osteopenia 2007   ? from xray with nl dexa   Persistent atrial fibrillation ([place])    a. Dx 06/2020 in setting of stroke. CHA2DS2VASc = 5-->Eliquis; b. 08/2020 Successful DCCV; c. 09/2020 Office f/u- pt in rate-controlled Afib.   Reactive depression (situational)    very well controlled, and now takes prozac for sleep   Sjogren's syndrome ([place])    Stroke ([place]) 06/2020   Urine incontinence    Vitamin D deficiency     Past Surgical History:  Procedure Laterality Date   BILATERAL OOPHORECTOMY  205   CARDIOVERSION N/A 08/24/2020   Procedure: CARDIOVERSION;  Surgeon: Wellington Hampshire, MD;  Location: [facility] ORS;  Service: Cardiovascular;  Laterality: N/A;   CATARACT EXTRACTION, BILATERAL     10/2019, 12/2019   COLONOSCOPY  5/07   normal; recheck 3 years   [place]   Ruptured disk L5   MASTECTOMY  2001   bilateral    TONSILLECTOMY  1974   TOTAL HIP ARTHROPLASTY  2011   TOTAL KNEE ARTHROPLASTY  2006   bilateral    Family History  Problem Relation Age of Onset   Arthritis Other        family hx   Breast cancer Other        1st degree relative <50   Diabetes Other        1st degree relative   Hyperlipidemia Other        family hx   Hypertension Other        family hx   Ovarian cancer Other        family hx   Prostate cancer Other        1st degree relative <50   Dementia Mother    Psoriasis Brother    Other Daughter        BRCA gene   Brain cancer Brother    Stroke Maternal Grandmother     Social History   Socioeconomic History   Marital status: Widowed    Spouse name: Not on file   Number of children: 3   Years of education: Not on file   Highest education level: Not on file  Occupational History  Occupation: Retired  Tobacco Use   Smoking status: Never   Smokeless tobacco: Never  Vaping Use   Vaping Use: Never used  Substance and Sexual Activity   Alcohol use: Not Currently    Alcohol/week: 0.0 standard drinks of alcohol    Comment: rare, none in 2 years   Drug use: No   Sexual activity: Not Currently  Other Topics Concern   Not on file  Social History Narrative   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      Regular exercise-yes   Social Determinants of Health   Financial Resource Strain: [place]  (02/15/2021)   Overall Financial Resource Strain (CARDIA)    Difficulty of Paying Living Expenses: Not hard at all  Food Insecurity: No Food Insecurity (02/15/2021)   Hunger Vital Sign    Worried About Running Out of Food in the Last Year: Never true    [place] in the Last Year: Never true  Transportation Needs: No Transportation Needs (02/15/2021)   PRAPARE - Hydrologist (Medical): No    Lack of Transportation (Non-Medical): No  Physical Activity: Insufficiently Active (02/15/2021)    Exercise Vital Sign    Days of Exercise per Week: 4 days    Minutes of Exercise per Session: 20 min  Stress: No Stress Concern Present (02/15/2021)   [place]    Feeling of Stress : Not at all  Social Connections: Moderately Integrated (02/15/2021)   Social Connection and Isolation Panel [NHANES]    Frequency of Communication with Friends and Family: More than three times a week    Frequency of Social Gatherings with Friends and Family: More than three times a week    Attends Religious Services: More than 4 times per year    Active Member of [employer] or Organizations: Yes    Attends Archivist Meetings: More than 4 times per year    Marital Status: Widowed  Intimate Partner Violence: Not At Risk (02/15/2021)   Humiliation, Afraid, Rape, and Kick questionnaire    Fear of Current or Ex-Partner: No    Emotionally Abused: No    Physically Abused: No    Sexually Abused: No    Outpatient Medications Prior to Visit  Medication Sig Dispense Refill   acetaminophen (TYLENOL) 500 MG tablet Take 1,000 mg by mouth every 6 (six) hours as needed for moderate pain.     apixaban (ELIQUIS) 5 MG TABS tablet Take 1 tablet by mouth twice daily 180 tablet 1   Carboxymethylcellul-Glycerin (LUBRICATING EYE DROPS OP) Place 1 drop into both eyes 5 (five) times daily.     cetirizine (ZYRTEC) 10 MG tablet Take 10 mg by mouth at bedtime.     DULoxetine (CYMBALTA) 30 MG capsule Take 1 capsule (30 mg total) by mouth daily. 90 capsule 3   EPINEPHrine 0.3 mg/0.3 mL IJ SOAJ injection Inject 0.3 mLs (0.3 mg total) into the muscle once. (Patient taking differently: Inject 0.3 mg into the muscle as needed for anaphylaxis.) 1 Device 5   ezetimibe (ZETIA) 10 MG tablet Take 1 tablet (10 mg total) by mouth daily. 90 tablet 3   Fish Oil-Cholecalciferol (OMEGA-3 + VITAMIN D3 PO) Take 2 capsules by mouth daily.      fluticasone (FLONASE) 50 MCG/ACT nasal spray  Place into both nostrils daily as needed for  allergies or rhinitis.     gabapentin (NEURONTIN) 300 MG capsule Take one by mouth in the am, one mid day and 2 at bedtime 360 capsule 3   metoprolol tartrate (LOPRESSOR) 25 MG tablet Take 1 tablet by mouth twice daily 180 tablet 2   omeprazole (PRILOSEC) 20 MG capsule Take 20 mg by mouth daily.     oxybutynin (OXYTROL) 3.9 MG/24HR Place 1 patch onto the skin 2 (two) times a week. 24 patch 3   pilocarpine (SALAGEN) 5 MG tablet Take 1 tablet (5 mg total) by mouth 3 (three) times daily. 270 tablet 3   REPATHA SURECLICK 096 MG/ML SOAJ INJECT 1 DOSE (140MG) INTO THE SKIN EVERY 14 DAYS 2 mL 2   vitamin B-12 (CYANOCOBALAMIN) 1000 MCG tablet Take 1,000 mcg by mouth daily.     vitamin C (ASCORBIC ACID) 500 MG tablet Take 500 mg by mouth daily.     zinc gluconate 50 MG tablet Take 50 mg by mouth daily.     No facility-administered medications prior to visit.    Allergies  Allergen Reactions   Crestor [Rosuvastatin]     myalgias   Lipitor [Atorvastatin]     myalgias   Statins     joint pain   Wasp Venom Swelling        Objective:    Physical Exam Constitutional:      Appearance: Normal appearance. She is obese.  Pulmonary:     Effort: Pulmonary effort is normal.  Skin:    General: Skin is warm.     Findings: Rash (right lower lateral side with redness, not warm to site but approx 3 cm diameter) present.     Comments: Also with red superficial burn to right mid lateral forearm, inner. Not blanching.   Neurological:     Mental Status: She is alert.     BP 110/68   Pulse 80   Temp 98.3 F (36.8 C)   Resp 16   Ht '5\' 8"'  (1.727 m)   Wt 250 lb 4 oz (113.5 kg)   SpO2 97%   BMI 38.05 kg/m  Wt Readings from Last 3 Encounters:  08/23/21 250 lb 4 oz (113.5 kg)  06/21/21 253 lb 6 oz (114.9 kg)  05/28/21 257 lb (116.6 kg)     Health Maintenance Due  Topic Date Due   COVID-19 Vaccine (4 - Pfizer series) 02/21/2020    There are no  preventive care reminders to display for this patient.  Lab Results  Component Value Date   TSH 0.86 02/12/2021   Lab Results  Component Value Date   WBC 8.3 03/05/2021   HGB 14.0 03/05/2021   HCT 43.3 03/05/2021   MCV 86 03/05/2021   PLT 265 03/05/2021   Lab Results  Component Value Date   NA 137 03/05/2021   K 4.2 03/05/2021   CHLORIDE 107 10/31/2012   CO2 22 03/05/2021   GLUCOSE 105 (H) 03/05/2021   BUN 9 03/05/2021   CREATININE 0.53 (L) 03/05/2021   BILITOT 0.4 03/05/2021   ALKPHOS 82 03/05/2021   AST 18 03/05/2021   ALT 16 03/05/2021   PROT 6.9 03/05/2021   ALBUMIN 4.1 03/05/2021   CALCIUM 7.5 (L) 03/05/2021   ANIONGAP 8 06/15/2020   EGFR 96 03/05/2021   GFR 86.53 02/12/2021   Lab Results  Component Value Date   HGBA1C 6.5 (A) 08/23/2021      Assessment & Plan:   Problem List Items Addressed This Visit  Musculoskeletal and Integument   Erythema due to burn (first degree) of upper arm, right, initial encounter    rx silvadene Use as needed F/u if any s/s infection       Relevant Medications   silver sulfADIAZINE (SILVADENE) 1 % cream     Other   Cellulitis of right lower extremity - Primary    rx doycycline 100 mg rx triamcinolone 0.5 % prn itch Monitor daily for s/s infection  F/u if no improvement      Relevant Medications   doxycycline (VIBRA-TABS) 100 MG tablet   triamcinolone cream (KENALOG) 0.1 %   silver sulfADIAZINE (SILVADENE) 1 % cream    Meds ordered this encounter  Medications   doxycycline (VIBRA-TABS) 100 MG tablet    Sig: Take 1 tablet (100 mg total) by mouth 2 (two) times daily for 7 days.    Dispense:  14 tablet    Refill:  0    Order Specific Question:   Supervising Provider    Answer:   BEDSOLE, AMY E [[id number]]   triamcinolone cream (KENALOG) 0.1 %    Sig: Apply 1 Application topically 2 (two) times daily.    Dispense:  60 g    Refill:  0    Order Specific Question:   Supervising Provider    Answer:   BEDSOLE,  AMY E [[id number]]   silver sulfADIAZINE (SILVADENE) 1 % cream    Sig: Apply 1 Application topically daily.    Dispense:  50 g    Refill:  0    Order Specific Question:   Supervising Provider    Answer:   BEDSOLE, AMY E [[id number]]    Follow-up: No follow-ups on file.    Lauren Pancoast, FNP

## 2021-09-14 ENCOUNTER — Telehealth: Payer: Self-pay

## 2021-09-14 NOTE — Progress Notes (Signed)
Chronic Care Management Pharmacy Assistant   Name: Lauren [name]  MRN: [id number] DOB: [birth year]  Reason for Encounter: CCM (General Adherence)  Recent office visits:  None since last CCM contact  Recent consult visits:  08/23/21 Lauren Pancoast, NP (Family Medicine) Cellulitis of right lower extremity Start: doxycycline (VIBRA-TABS) 100 MG tablet Start: silver sulfADIAZINE (SILVADENE) 1 % cream Start triamcinolone cream (KENALOG) 0.1 % 07/15/21 Hemang [name] (Neurology) Bilateral carpal tunnel syndrome Procedure: 0.75 ml 2% Lidocaine HCL ('20mg'$ /ml) and 0.25 ml Triamcinolone Acetonide (40 mg/ml) 07/08/21 Hamang [name] (Neurology) Bilateral carpal tunnel syndrome No other information 06/21/21 Lauren Furth, PA (Cardiology) Permanent atrial fibrillation Ordered: VAS US Extremity Venous Duplex and EKG  06/01/21 Lauren Posey Pronto, MD (Rheumatology) Sjogren's syndrome FU 4 months 05/28/21 Lauren Edison, NP (Pulmonary) OSA No med changes FU 1 year   Hospital visits:  None in previous 6 months  Medications: Outpatient Encounter Medications as of 09/14/2021  Medication Sig   acetaminophen (TYLENOL) 500 MG tablet Take 1,000 mg by mouth every 6 (six) hours as needed for moderate pain.   apixaban (ELIQUIS) 5 MG TABS tablet Take 1 tablet by mouth twice daily   Carboxymethylcellul-Glycerin (LUBRICATING EYE DROPS OP) Place 1 drop into both eyes 5 (five) times daily.   cetirizine (ZYRTEC) 10 MG tablet Take 10 mg by mouth at bedtime.   DULoxetine (CYMBALTA) 30 MG capsule Take 1 capsule (30 mg total) by mouth daily.   EPINEPHrine 0.3 mg/0.3 mL IJ SOAJ injection Inject 0.3 mLs (0.3 mg total) into the muscle once. (Patient taking differently: Inject 0.3 mg into the muscle as needed for anaphylaxis.)   ezetimibe (ZETIA) 10 MG tablet Take 1 tablet (10 mg total) by mouth daily.   Fish Oil-Cholecalciferol (OMEGA-3 + VITAMIN D3 PO) Take 2 capsules by mouth daily.    fluticasone (FLONASE) 50 MCG/ACT nasal spray Place  into both nostrils daily as needed for allergies or rhinitis.   gabapentin (NEURONTIN) 300 MG capsule Take one by mouth in the am, one mid day and 2 at bedtime   metoprolol tartrate (LOPRESSOR) 25 MG tablet Take 1 tablet by mouth twice daily   omeprazole (PRILOSEC) 20 MG capsule Take 20 mg by mouth daily.   oxybutynin (OXYTROL) 3.9 MG/24HR Place 1 patch onto the skin 2 (two) times a week.   pilocarpine (SALAGEN) 5 MG tablet Take 1 tablet (5 mg total) by mouth 3 (three) times daily.   REPATHA SURECLICK 703 MG/ML SOAJ INJECT 1 DOSE ('140MG'$ ) INTO THE SKIN EVERY 14 DAYS   silver sulfADIAZINE (SILVADENE) 1 % cream Apply 1 Application topically daily.   triamcinolone cream (KENALOG) 0.1 % Apply 1 Application topically 2 (two) times daily.   vitamin B-12 (CYANOCOBALAMIN) 1000 MCG tablet Take 1,000 mcg by mouth daily.   vitamin C (ASCORBIC ACID) 500 MG tablet Take 500 mg by mouth daily.   zinc gluconate 50 MG tablet Take 50 mg by mouth daily.   No facility-administered encounter medications on file as of 09/14/2021.   Contacted Lauren [name] on 09/21/21 for general disease state and medication adherence call.   Star Medications: Medication Name/mg Last Fill Days Supply No star meds noted  What concerns do you have about your medications? No concerns  The patient denies side effects with their medications.   How often do you forget or accidentally miss a dose? Never  Do you use a pillbox? Yes  Are you having any problems getting your medications from your pharmacy? No  Has the cost of your  medications been a concern? No  Since last visit with CPP, the following interventions have been made.  Start: doxycycline (VIBRA-TABS) 100 MG tablet  Start: silver sulfADIAZINE (SILVADENE) 1 % cream  Start triamcinolone cream (KENALOG) 0.1 %  The patient has not had an ED visit since last contact.   The patient reports the following problems with their health. Patient states she has a lot of neck pain  and pain with her upper torso. The pain can be bad enough at times that she can not sleep at night. Patient did not fall asleep last night until 3:00 am. Patient states she has been canning salsa and pickles lately; moving the pot with water and cans has been extra hard on her. Patient stated when she is in pain her pain level is 8/10. Patient had a prescription prescribed by Lauren [name] on 01/28/20 for Methocarbamol 500 mg 1 tab BID Quantity: 40 20ds and she took her last one Saturday night (09/18/21). Patient states she takes them so infrequently they have lasted her this long but the pain can be so bad at times it is the only thing that helps. She will try Tylenol as well, but it does not seem to help. Patient is asking if Lauren [name] is able to refill the Methocarbamol prescription for her. I advised patient she may have to been seen due to the prescription being prescribed in 2021. Patient understood.  Patient denies concerns or questions for Lauren [name], PharmD at this time.   Care Gaps: Annual wellness visit in last year? Yes 02/15/21 Most Recent BP reading: 110/68 on 08/23/21  Upcoming appointments: CCM appointment on 11/23/21  Lauren [name], CPP notified  Lauren [name], [place] Assistant [phone]

## 2021-09-21 MED ORDER — METHOCARBAMOL 500 MG PO TABS: 500.0000 mg | ORAL_TABLET | Freq: Two times a day (BID) | ORAL | 1 refills | Status: DC | PRN

## 2021-09-21 NOTE — Telephone Encounter (Signed)
Patient requests refill for methocarbamol 500 mg.  Routing to PCP.  Patient had a prescription prescribed by Dr. Danise Mina on 01/28/20 for Methocarbamol 500 mg 1 tab BID #40, she took it so infrequently it had lasted her until this month. Patient states she has a lot of neck pain and pain with her upper torso, she thinks due to the fact that she has been canning vegetables lately. Patient is asking if Dr. Glori Bickers is able to refill the Methocarbamol prescription for her, advised patient she may have to been seen due to the original Rx being prescribed in 2021.  Preferred pharmacy: [facility] [address], [place] - [place] [address] Phone: [phone] Fax: [phone]

## 2021-09-21 NOTE — Telephone Encounter (Signed)
I sent it, thanks for the heads up

## 2021-09-21 NOTE — Addendum Note (Signed)
Addended by: Loura Pardon A on: 09/21/2021 01:57 PM   Modules accepted: Orders

## 2021-09-22 ENCOUNTER — Telehealth: Payer: Self-pay

## 2021-09-22 NOTE — Telephone Encounter (Signed)
Prior auth started and approved for Methocarbamol '500MG'$  tablets. [name] Key: [id number] - PA Case ID: [id number] - Rx #: [id number]  Approved today Case [id number];RNHAFB:[id number];Review Type:Prior Auth;Coverage Start Date:08/23/2021;Coverage End Date:09/22/2022  Notified patient via mychart.

## 2021-09-22 NOTE — Progress Notes (Signed)
Message was left

## 2021-10-01 DIAGNOSIS — M791 Myalgia, unspecified site: Secondary | ICD-10-CM | POA: Diagnosis not present

## 2021-10-01 DIAGNOSIS — M3509 Sicca syndrome with other organ involvement: Secondary | ICD-10-CM | POA: Diagnosis not present

## 2021-10-01 DIAGNOSIS — M159 Polyosteoarthritis, unspecified: Secondary | ICD-10-CM | POA: Diagnosis not present

## 2021-10-03 ENCOUNTER — Ambulatory Visit
Admission: EM | Admit: 2021-10-03 | Discharge: 2021-10-03 | Disposition: A | Discharge status: Home / Self Care | Payer: Medicare Other | Attending: Emergency Medicine | Admitting: Emergency Medicine

## 2021-10-03 DIAGNOSIS — L02212 Cutaneous abscess of back [any part, except buttock]: Secondary | ICD-10-CM | POA: Diagnosis not present

## 2021-10-03 MED ORDER — SULFAMETHOXAZOLE-TRIMETHOPRIM 800-160 MG PO TABS: 1.0000 | ORAL_TABLET | Freq: Two times a day (BID) | ORAL | 0 refills | Status: AC

## 2021-10-03 NOTE — ED Triage Notes (Signed)
Patient to Urgent Care with complaints of an "infected cyst" on her back (upper middle). Reports last Sunday the cysts- started draining purulent drainage. Has been able to keep it clean and dressed, has been applying triple antibiotic ointment. Denies any known fevers.

## 2021-10-03 NOTE — Discharge Instructions (Addendum)
Take the Septra as directed.  Keep your wound clean and dry.  Wash it gently twice a day with soap and water.  Apply an antibiotic cream twice a day.    Follow up with a dermatologist or your primary care provider.

## 2021-10-03 NOTE — ED Provider Notes (Signed)
[name]    CSN: [id number] Arrival date & time: 10/03/21  0807      History   Chief Complaint Chief Complaint  Patient presents with   Abscess    HPI Satine Hausner is a 76 y.o. female.  Patient presents with a draining abscess on her right upper back x1 week.  She states the abscess has been there for many years and occasionally drains.  It started draining again 1 week ago and appears to be getting infected because it now has redness around.  No fever, chills, or other symptoms.  Patient was treated with doxycycline on 08/23/2021 for cellulitis of RLE. Her medical history includes stroke, atrial fibrillation, Sjogren's syndrome, breast cancer.  Patient is on Eliquis.   The history is provided by the patient and medical records.    Past Medical History:  Diagnosis Date   Breast cancer ([place]) 2002   DCIS; BRCA 1 pos HER2 pos, ER2 pos   DDD (degenerative disc disease)    DNR (do not resuscitate) 06/13/2020   GERD (gastroesophageal reflux disease)    History of echocardiogram    a. 06/2020 Echo: EF 55-60%, no rwma, nl RV size/fxn. Mildly dil LA.   HLD (hyperlipidemia)    statin intolerant   Hyperglycemia 10/2007   A1C elvated to 6.2   Obstructive sleep apnea    Osteoarthritis    Osteopenia 2007   ? from xray with nl dexa   Persistent atrial fibrillation ([place])    a. Dx 06/2020 in setting of stroke. CHA2DS2VASc = 5-->Eliquis; b. 08/2020 Successful DCCV; c. 09/2020 Office f/u- pt in rate-controlled Afib.   Reactive depression (situational)    very well controlled, and now takes prozac for sleep   Sjogren's syndrome ([place])    Stroke ([place]) 06/2020   Urine incontinence    Vitamin D deficiency     Patient Active Problem List   Diagnosis Date Noted   Erythema due to burn (first degree) of upper arm, right, initial encounter 08/23/2021   Cellulitis of right lower extremity 08/23/2021   IBS (irritable bowel syndrome) 05/24/2021   Lump in neck 03/09/2021   Atrial  fibrillation ([place]) 09/29/2020   Myofascial pain 09/29/2020   History of CVA (cerebrovascular accident) 06/13/2020   DNR (do not resuscitate) 06/13/2020   Elevated blood pressure reading 01/28/2020   Dermatochalasis of both upper eyelids 07/15/2019   Ptosis of both upper eyelids 07/15/2019   Carpal tunnel syndrome 01/25/2019   Age-related nuclear cataract, bilateral 06/19/2018   Vitamin B12 deficiency 03/27/2018   Sjogren's disease ([place]) 01/15/2018   Osteoarthritis 01/15/2018   Obesity (BMI 30-39.9) 01/15/2018   H/O herpes zoster 11/22/2017   Estrogen deficiency 01/09/2017   Routine general medical examination at a health care facility 01/01/2017   OSA (obstructive sleep apnea) 09/18/2016   Bee sting allergy 06/10/2014   Morbid obesity ([place]) 06/10/2014   Encounter for Medicare annual wellness exam 12/03/2013   History of shingles 11/27/2013   Hx of breast cancer 08/22/2011   Gynecological examination 02/14/2011   Bilateral dry eyes 01/20/2011   Dry eye syndrome of bilateral lacrimal glands 01/20/2011   Vitamin D deficiency 04/22/2008   Hyperlipidemia 04/22/2008   GERD 04/22/2008   Prediabetes 04/22/2008    Past Surgical History:  Procedure Laterality Date   BILATERAL OOPHORECTOMY  205   CARDIOVERSION N/A 08/24/2020   Procedure: CARDIOVERSION;  Surgeon: Wellington Hampshire, MD;  Location: [facility] ORS;  Service: Cardiovascular;  Laterality: N/A;   CATARACT EXTRACTION, BILATERAL  10/2019, 12/2019   COLONOSCOPY  5/07   normal; recheck 3 years   [place]   Ruptured disk L5   MASTECTOMY  2001   bilateral   TONSILLECTOMY  1974   TOTAL HIP ARTHROPLASTY  2011   TOTAL KNEE ARTHROPLASTY  2006   bilateral    OB History   No obstetric history on file.      Home Medications    Prior to Admission medications   Medication Sig Start Date End Date Taking? Authorizing Provider  sulfamethoxazole-trimethoprim (BACTRIM DS) 800-160 MG tablet Take 1 tablet by mouth 2  (two) times daily for 7 days. 10/03/21 10/10/21 Yes Sharion Balloon, NP  acetaminophen (TYLENOL) 500 MG tablet Take 1,000 mg by mouth every 6 (six) hours as needed for moderate pain.    [provider]  apixaban (ELIQUIS) 5 MG TABS tablet Take 1 tablet by mouth twice daily 06/28/21   Wellington Hampshire, MD  Carboxymethylcellul-Glycerin (LUBRICATING EYE DROPS OP) Place 1 drop into both eyes 5 (five) times daily.    [provider]  cetirizine (ZYRTEC) 10 MG tablet Take 10 mg by mouth at bedtime.    [provider]  DULoxetine (CYMBALTA) 30 MG capsule Take 1 capsule (30 mg total) by mouth daily. 02/19/21   Tower, Wynelle Fanny, MD  EPINEPHrine 0.3 mg/0.3 mL IJ SOAJ injection Inject 0.3 mLs (0.3 mg total) into the muscle once. Patient taking differently: Inject 0.3 mg into the muscle as needed for anaphylaxis. 06/10/14   Tower, Wynelle Fanny, MD  ezetimibe (ZETIA) 10 MG tablet Take 1 tablet (10 mg total) by mouth daily. 05/24/21   Tower, Wynelle Fanny, MD  Fish Oil-Cholecalciferol (OMEGA-3 + VITAMIN D3 PO) Take 2 capsules by mouth daily.     [provider]  fluticasone (FLONASE) 50 MCG/ACT nasal spray Place into both nostrils daily as needed for allergies or rhinitis.    [provider]  gabapentin (NEURONTIN) 300 MG capsule Take one by mouth in the am, one mid day and 2 at bedtime 02/19/21   Tower, Wynelle Fanny, MD  methocarbamol (ROBAXIN) 500 MG tablet Take 1 tablet (500 mg total) by mouth 2 (two) times daily as needed for muscle spasms. Cauttion of sedation 09/21/21   Tower, Wynelle Fanny, MD  metoprolol tartrate (LOPRESSOR) 25 MG tablet Take 1 tablet by mouth twice daily 12/25/20   Wellington Hampshire, MD  omeprazole (PRILOSEC) 20 MG capsule Take 20 mg by mouth daily.    [provider]  oxybutynin (OXYTROL) 3.9 MG/24HR Place 1 patch onto the skin 2 (two) times a week. 02/22/21   Tower, Wynelle Fanny, MD  pilocarpine (SALAGEN) 5 MG tablet Take 1 tablet (5 mg total) by mouth 3 (three) times  daily. 02/19/21   Tower, Wynelle Fanny, MD  REPATHA SURECLICK 263 MG/ML SOAJ INJECT 1 DOSE (140MG) INTO THE SKIN EVERY 14 DAYS 07/16/21   Wellington Hampshire, MD  silver sulfADIAZINE (SILVADENE) 1 % cream Apply 1 Application topically daily. 08/23/21   Eugenia Pancoast, FNP  vitamin B-12 (CYANOCOBALAMIN) 1000 MCG tablet Take 1,000 mcg by mouth daily.    [provider]  vitamin C (ASCORBIC ACID) 500 MG tablet Take 500 mg by mouth daily.    [provider]  zinc gluconate 50 MG tablet Take 50 mg by mouth daily.    [provider]    Family History Family History  Problem Relation Age of Onset   Arthritis Other  family hx   Breast cancer Other        1st degree relative <50   Diabetes Other        1st degree relative   Hyperlipidemia Other        family hx   Hypertension Other        family hx   Ovarian cancer Other        family hx   Prostate cancer Other        1st degree relative <50   Dementia Mother    Psoriasis Brother    Other Daughter        BRCA gene   Brain cancer Brother    Stroke Maternal Grandmother     Social History Social History   Tobacco Use   Smoking status: Never   Smokeless tobacco: Never  Vaping Use   Vaping Use: Never used  Substance Use Topics   Alcohol use: Not Currently    Alcohol/week: 0.0 standard drinks of alcohol    Comment: rare, none in 2 years   Drug use: No     Allergies   Crestor [rosuvastatin], Lipitor [atorvastatin], Statins, and Wasp venom   Review of Systems Review of Systems  Constitutional:  Negative for chills and fever.  Skin:  Positive for color change and wound.  All other systems reviewed and are negative.    Physical Exam Triage Vital Signs ED Triage Vitals  Enc Vitals Group     BP      Pulse      Resp      Temp      Temp src      SpO2      Weight      Height      Head Circumference      Peak Flow      Pain Score      Pain Loc      Pain Edu?      Excl. in [place]?    No data  found.  Updated Vital Signs BP 113/81   Pulse 97   Temp 97.9 F (36.6 C)   Resp 18   Ht '5\' 7"'  (1.702 m)   Wt 250 lb (113.4 kg)   SpO2 97%   BMI 39.16 kg/m   Visual Acuity Right Eye Distance:   Left Eye Distance:   Bilateral Distance:    Right Eye Near:   Left Eye Near:    Bilateral Near:     Physical Exam Vitals and nursing note reviewed.  Constitutional:      General: She is not in acute distress.    Appearance: She is well-developed. She is not ill-appearing.  HENT:     Mouth/Throat:     Mouth: Mucous membranes are moist.  Cardiovascular:     Rate and Rhythm: Normal rate and regular rhythm.  Pulmonary:     Effort: Pulmonary effort is normal. No respiratory distress.  Musculoskeletal:     Cervical back: Neck supple.  Skin:    General: Skin is warm and dry.     Findings: Erythema and lesion present.     Comments: Approximately 4 cm x 4 cm area of induration with localized erythema and multiple 1 mm open areas that are draining purulent drainage.  Neurological:     Mental Status: She is alert.  Psychiatric:        Mood and Affect: Mood normal.        Behavior: Behavior normal.  UC Treatments / Results  Labs (all labs ordered are listed, but only abnormal results are displayed) Labs Reviewed - No data to display  EKG   Radiology No results found.  Procedures Wound Care  Date/Time: 10/03/2021 9:17 AM  Performed by: Sharion Balloon, NP Authorized by: Sharion Balloon, NP   Consent:    Consent obtained:  Verbal   Consent given by:  Patient   Risks discussed:  Pain, bleeding and incomplete drainage Universal protocol:    Procedure explained and questions answered to patient or proxy's satisfaction: yes   Anesthesia:    Anesthesia method:  None Procedure details:    Wound location:  Trunk   Trunk location:  Upper back Post-procedure details:    Procedure completion:  Tolerated well, no immediate complications Comments:     Aspiration of  purulent drainage from open draining abscess.  Dressed with antibiotic ointment and nonadherent dressing.  (including critical care time)  Medications Ordered in UC Medications - No data to display  Initial Impression / Assessment and Plan / UC Course  I have reviewed the triage vital signs and the nursing notes.  Pertinent labs & imaging results that were available during my care of the patient were reviewed by me and considered in my medical decision making (see chart for details).   Abscess on right upper back.  The area is already open and draining.  When palpated, moderate amount of purulent drainage.  Dressed with antibiotic ointment and nonadherent dressing.  Patient was recently on doxycycline for cellulitis of her leg.  Treating today with Bactrim.  Wound care instructions discussed.  Instructed patient to follow-up with her PCP or dermatologist on Monday.  She agrees to plan of care.   Final Clinical Impressions(s) / UC Diagnoses   Final diagnoses:  Abscess of back     Discharge Instructions      Take the Septra as directed.  Keep your wound clean and dry.  Wash it gently twice a day with soap and water.  Apply an antibiotic cream twice a day.    Follow up with a dermatologist or your primary care provider.         ED Prescriptions     Medication Sig Dispense Auth. Provider   sulfamethoxazole-trimethoprim (BACTRIM DS) 800-160 MG tablet Take 1 tablet by mouth 2 (two) times daily for 7 days. 14 tablet Sharion Balloon, NP      PDMP not reviewed this encounter.   Sharion Balloon, NP 10/03/21 [phone]

## 2021-10-04 ENCOUNTER — Other Ambulatory Visit: Payer: Self-pay | Admitting: Cardiovascular Disease

## 2021-10-06 DIAGNOSIS — L728 Other follicular cysts of the skin and subcutaneous tissue: Secondary | ICD-10-CM | POA: Diagnosis not present

## 2021-11-04 ENCOUNTER — Other Ambulatory Visit: Payer: Self-pay | Admitting: Cardiovascular Disease

## 2021-11-08 DIAGNOSIS — L728 Other follicular cysts of the skin and subcutaneous tissue: Secondary | ICD-10-CM | POA: Diagnosis not present

## 2021-11-08 DIAGNOSIS — L538 Other specified erythematous conditions: Secondary | ICD-10-CM | POA: Diagnosis not present

## 2021-11-08 DIAGNOSIS — L720 Epidermal cyst: Secondary | ICD-10-CM | POA: Diagnosis not present

## 2021-11-15 ENCOUNTER — Ambulatory Visit (INDEPENDENT_AMBULATORY_CARE_PROVIDER_SITE_OTHER): Payer: Medicare Other | Admitting: Nurse Practitioner

## 2021-11-15 VITALS — BP 126/82 | HR 80 | Temp 96.7°F | Resp 14 | Wt 252.4 lb

## 2021-11-15 DIAGNOSIS — S90862A Insect bite (nonvenomous), left foot, initial encounter: Secondary | ICD-10-CM | POA: Diagnosis not present

## 2021-11-15 DIAGNOSIS — L03115 Cellulitis of right lower limb: Secondary | ICD-10-CM | POA: Insufficient documentation

## 2021-11-15 DIAGNOSIS — W57XXXA Bitten or stung by nonvenomous insect and other nonvenomous arthropods, initial encounter: Secondary | ICD-10-CM | POA: Diagnosis not present

## 2021-11-15 DIAGNOSIS — L03116 Cellulitis of left lower limb: Secondary | ICD-10-CM | POA: Diagnosis not present

## 2021-11-15 DIAGNOSIS — L03119 Cellulitis of unspecified part of limb: Secondary | ICD-10-CM | POA: Insufficient documentation

## 2021-11-15 MED ORDER — DOXYCYCLINE HYCLATE 100 MG PO TABS: 100.0000 mg | ORAL_TABLET | Freq: Two times a day (BID) | ORAL | 0 refills | Status: AC

## 2021-11-15 MED ORDER — HYDROXYZINE HCL 10 MG PO TABS: 10.0000 mg | ORAL_TABLET | Freq: Two times a day (BID) | ORAL | 0 refills | Status: DC | PRN

## 2021-11-15 NOTE — Assessment & Plan Note (Signed)
We will go ahead and cover her for cellulitis.  Doxycycline 100 mg twice daily for 7 days patient will follow-up in the next 4 days to ensure making improvement.

## 2021-11-15 NOTE — Patient Instructions (Signed)
Nice to see you today Follow up on Friday to make sure you are healing well with Dr. Glori Bickers, or me if she is unavailable  Do not take benadryl with the medication I prescribed.  Message me what cream you are using on your foot via mychart

## 2021-11-15 NOTE — Progress Notes (Signed)
Acute Office Visit  Subjective:     Patient ID: Lauren [name], female    DOB: May 14, 1945, 76 y.o.   MRN: [id number]  Chief Complaint  Patient presents with   Foot Swelling    Left foot pain, swelling, itching, redness. Noticed pain on the left foot on 11/09/21 and got worse from that.    HPI Patient is in today for Foot swelling  States that when she drove home, her foot was huritng. States that she looked at her foot and did not see anyting. States that she did put calamine on it because posion oak. States that she did soak in epsom salt. States that if she soaks in soda water that helps.  Worsening   Describes pain as ithcing and will hurt with walking.  States that the car ride was 2 hours and had a picnic outside when she got there.  Patient has no history of blood clots patient is anticoagulated due to atrial fibrillation.  Review of Systems  Constitutional:  Negative for chills and fever.  Respiratory:  Negative for shortness of breath.   Cardiovascular:  Positive for leg swelling. Negative for chest pain.  Skin:  Positive for itching.        Objective:    BP 126/82   Pulse 80   Temp (!) 96.7 F (35.9 C) (Temporal)   Resp 14   Wt 252 lb 6 oz (114.5 kg)   SpO2 100%   BMI 39.53 kg/m    Physical Exam Vitals and nursing note reviewed.  Constitutional:      Appearance: Normal appearance.  Cardiovascular:     Rate and Rhythm: Normal rate. Rhythm irregular.     Heart sounds: Normal heart sounds.  Pulmonary:     Effort: Pulmonary effort is normal.     Breath sounds: Normal breath sounds.  Musculoskeletal:     Left lower leg: Edema present.  Skin:    Comments: See clinical photo.  Erythema, edema, slight warmth. 3 small bites in the process of healing to the proximal lateral foot  Neurological:     Mental Status: She is alert.      No results found for any visits on 11/15/21.      Assessment & Plan:   Problem List Items Addressed This Visit        Musculoskeletal and Integument   Insect bite of left foot - Primary    Likely related to an insect bite patient did have 3 small bites of like they are healing see clinical photo.  We will go ahead and treat with doxycycline 100 mg twice daily to cover for cellulitis.  Also send in hydroxyzine 10 mg twice daily as needed for itching.  Patient continues in her second-generation antihistamine told patient to stop using Benadryl      Relevant Medications   hydrOXYzine (ATARAX) 10 MG tablet     Other   Cellulitis of left lower extremity    We will go ahead and cover her for cellulitis.  Doxycycline 100 mg twice daily for 7 days patient will follow-up in the next 4 days to ensure making improvement.      Relevant Medications   doxycycline (VIBRA-TABS) 100 MG tablet    Meds ordered this encounter  Medications   doxycycline (VIBRA-TABS) 100 MG tablet    Sig: Take 1 tablet (100 mg total) by mouth 2 (two) times daily for 7 days.    Dispense:  14 tablet    Refill:  0  Order Specific Question:   Supervising Provider    Answer:   Loura Pardon A [[id number]]   hydrOXYzine (ATARAX) 10 MG tablet    Sig: Take 1 tablet (10 mg total) by mouth 2 (two) times daily as needed. DO NOT TAKE WITH BENADRYL    Dispense:  20 tablet    Refill:  0    Order Specific Question:   Supervising Provider    Answer:   Loura Pardon A [[id number]]    Return in about 4 days (around 11/19/2021) for with me friday for wound recheck .  Romilda Garret, NP

## 2021-11-15 NOTE — Assessment & Plan Note (Signed)
Likely related to an insect bite patient did have 3 small bites of like they are healing see clinical photo.  We will go ahead and treat with doxycycline 100 mg twice daily to cover for cellulitis.  Also send in hydroxyzine 10 mg twice daily as needed for itching.  Patient continues in her second-generation antihistamine told patient to stop using Benadryl

## 2021-11-18 ENCOUNTER — Telehealth: Payer: Self-pay

## 2021-11-18 NOTE — Chronic Care Management (AMB) (Signed)
    Chronic Care Management Pharmacy Assistant   Name: Lauren [name]  MRN: [id number] DOB: 11/29/1945   Reason for Encounter: Reminder Call    Medications: Outpatient Encounter Medications as of 11/18/2021  Medication Sig   acetaminophen (TYLENOL) 500 MG tablet Take 1,000 mg by mouth every 6 (six) hours as needed for moderate pain.   apixaban (ELIQUIS) 5 MG TABS tablet Take 1 tablet by mouth twice daily   Carboxymethylcellul-Glycerin (LUBRICATING EYE DROPS OP) Place 1 drop into both eyes 5 (five) times daily.   cetirizine (ZYRTEC) 10 MG tablet Take 10 mg by mouth at bedtime.   doxycycline (VIBRA-TABS) 100 MG tablet Take 1 tablet (100 mg total) by mouth 2 (two) times daily for 7 days.   DULoxetine (CYMBALTA) 30 MG capsule Take 1 capsule (30 mg total) by mouth daily.   EPINEPHrine 0.3 mg/0.3 mL IJ SOAJ injection Inject 0.3 mLs (0.3 mg total) into the muscle once. (Patient taking differently: Inject 0.3 mg into the muscle as needed for anaphylaxis.)   ezetimibe (ZETIA) 10 MG tablet Take 1 tablet (10 mg total) by mouth daily.   Fish Oil-Cholecalciferol (OMEGA-3 + VITAMIN D3 PO) Take 2 capsules by mouth daily.    fluticasone (FLONASE) 50 MCG/ACT nasal spray Place into both nostrils daily as needed for allergies or rhinitis.   gabapentin (NEURONTIN) 300 MG capsule Take one by mouth in the am, one mid day and 2 at bedtime   hydrOXYzine (ATARAX) 10 MG tablet Take 1 tablet (10 mg total) by mouth 2 (two) times daily as needed. DO NOT TAKE WITH BENADRYL   methocarbamol (ROBAXIN) 500 MG tablet Take 1 tablet (500 mg total) by mouth 2 (two) times daily as needed for muscle spasms. Cauttion of sedation   metoprolol tartrate (LOPRESSOR) 25 MG tablet Take 1 tablet by mouth twice daily   omeprazole (PRILOSEC) 20 MG capsule Take 20 mg by mouth daily.   oxybutynin (OXYTROL) 3.9 MG/24HR Place 1 patch onto the skin 2 (two) times a week.   pilocarpine (SALAGEN) 5 MG tablet Take 1 tablet (5 mg total) by mouth 3  (three) times daily.   REPATHA SURECLICK 779 MG/ML SOAJ INJECT 1 DOSE ('140MG'$ ) UNDER THE SKIN EVERY 14 DAYS   silver sulfADIAZINE (SILVADENE) 1 % cream Apply 1 Application topically daily.   vitamin B-12 (CYANOCOBALAMIN) 1000 MCG tablet Take 1,000 mcg by mouth daily.   vitamin C (ASCORBIC ACID) 500 MG tablet Take 500 mg by mouth daily.   zinc gluconate 50 MG tablet Take 50 mg by mouth daily.   No facility-administered encounter medications on file as of 11/18/2021.   Lauren [name] was contacted to remind of upcoming telephone visit with [name] on 11/23/21 at 8:45. Patient was reminded to have any blood glucose and blood pressure readings available for review at appointment.    No VM set up, no answer home phone   CCM referral has been placed prior to visit?  No   Star Rating Drugs: Medication:  Last Fill: Day Supply No star medications identified   [name], CPP notified  Avel Sensor, [place]  [phone]

## 2021-11-19 ENCOUNTER — Ambulatory Visit (INDEPENDENT_AMBULATORY_CARE_PROVIDER_SITE_OTHER): Payer: Medicare Other | Admitting: Nurse Practitioner

## 2021-11-19 VITALS — BP 118/82 | HR 100 | Temp 96.7°F | Resp 10 | Ht 67.0 in | Wt 250.1 lb

## 2021-11-19 DIAGNOSIS — L03116 Cellulitis of left lower limb: Secondary | ICD-10-CM

## 2021-11-19 NOTE — Progress Notes (Signed)
   Established Patient Office Visit  Subjective   Patient ID: Lauren [name], female    DOB: 02-23-1945  Age: 76 y.o. MRN: [id number]  Chief Complaint  Patient presents with   Follow-up    On cellulitis, insect bite    HPI  Cellulitis: Patient was recently seen by me on 11/15/2021 for an acute office visit.  Patient was diagnosed with cellulitis and placed on doxycycline 100 mg twice daily along with Vistaril 10 mg as needed for itch.  Patient is here today for follow-up to ensure resolution. Patient also brought in a triamcinolone cream that she was using prior to the office visit to let me know what cream she is putting on her foot.  Overall improving per patient report states that she is no longer waking up at night having to do soaks to do with the pain and itching.  States that she is taking the antibiotic as prescribed and plans on finishing out the entire course.  States she did use the Vistaril and it helped greatly in regards with itching.   Review of Systems  Constitutional:  Negative for chills and fever.  Skin:  Positive for rash. Negative for itching.      Objective:     BP 118/82   Pulse 100   Temp (!) 96.7 F (35.9 C) (Temporal)   Resp 10   Ht '5\' 7"'$  (1.702 m)   Wt 250 lb 2 oz (113.5 kg)   SpO2 98%   BMI 39.18 kg/m    Physical Exam Vitals and nursing note reviewed.  Constitutional:      Appearance: Normal appearance.  Cardiovascular:     Pulses:          Dorsalis pedis pulses are 2+ on the left side.  Musculoskeletal:     Right lower leg: No edema.     Left lower leg: No edema.  Skin:    General: Skin is warm.     Findings: Erythema (improved) present. No rash.  Neurological:     Mental Status: She is alert.       No results found for any visits on 11/19/21.    The ASCVD Risk score (Arnett DK, et al., 2019) failed to calculate for the following reasons:   The patient has a prior MI or stroke diagnosis    Assessment & Plan:   Problem  List Items Addressed This Visit       Other   Cellulitis of left lower extremity - Primary    Patient has shown clinical improvement.  She was instructed to continue taking the doxycycline to completion.  She may use the Atarax on a as needed basis for itching.  Follow-up if no improvement       Return if symptoms worsen or fail to improve.    Romilda Garret, NP

## 2021-11-19 NOTE — Assessment & Plan Note (Signed)
Patient has shown clinical improvement.  She was instructed to continue taking the doxycycline to completion.  She may use the Atarax on a as needed basis for itching.  Follow-up if no improvement

## 2021-11-19 NOTE — Patient Instructions (Signed)
Nice to see you today Glad that you are healing up Continue to take the anitbiotics until completely finished. Follow up as needed or as scheduled with Dr. Glori Bickers

## 2021-11-23 ENCOUNTER — Telehealth: Payer: Self-pay | Admitting: Pharmacist

## 2021-11-23 ENCOUNTER — Telehealth: Payer: Medicare Other

## 2021-11-23 NOTE — Telephone Encounter (Signed)
  Chronic Care Management   Outreach Note  11/23/2021 Name: Zeriyah Wain MRN: [id number] DOB: 10-27-1945  Referred by: Tower, Wynelle Fanny, MD  Patient had a phone appointment scheduled with clinical pharmacist today.  An unsuccessful telephone outreach was attempted today. The patient was referred to the pharmacist for assistance with medications, care management and care coordination.   Patient will NOT be penalized in any way for missing a CCM appointment. The no-show fee does not apply.  If possible, a message was left to return call to: [phone] or to [facility].  [name], PharmD, BCACP Clinical Pharmacist [facility] [phone]

## 2021-11-23 NOTE — Progress Notes (Deleted)
Chronic Care Management Pharmacy Note  11/23/2021 Name:  Lauren [name] MRN:  [id number] DOB:  1945/07/30  Summary: CCM F/U visit -LDL 81 (03/2021), goal is < 70 given hx of stroke. Ezetimibe was added after most recent labwork, updated lipid panel needed to assess efficacy -Pt reports trouble sleeping. She is taking Duloxetine at night, we discussed the norepinephrine component may cause insomnia in some cases -Discussed donut hole/coverage gap. This would be the first year she deals with it as brand-name meds were started mid 2022 (Repatha, Eliquis). Pt will call prescriber if she runs into cost issues later in the year  Recommendations/Changes made from today's visit: -Advised to move duloxetine to AM to help with sleep -Advised trial of melatonin 5-10 mg HS  -Recommend repeat lipid panel at next cardiology appt  Plan: -[place] will call patient 4 months for general update -Pharmacist follow up televisit scheduled for 6 months -PCP annual visit 03/28/22; repeat A1c 08/2021    Subjective: Lauren [name] is an 76 y.o. year old female who is a primary patient of Tower, Wynelle Fanny, MD.  The CCM team was consulted for assistance with disease management and care coordination needs.    Engaged with patient by telephone for follow up visit in response to provider referral for pharmacy case management and/or care coordination services.   Consent to Services:  The patient was given information about Chronic Care Management services, agreed to services, and gave verbal consent prior to initiation of services.  Please see initial visit note for detailed documentation.   Patient Care Team: Tower, Wynelle Fanny, MD as PCP - General Wellington Hampshire, MD as PCP - Cardiology (Cardiology) Sharol Roussel, [place] as Physician Assistant (Optometry) Lorelee Cover., MD as Referring Physician (Ophthalmology) Laverle Hobby, MD as Consulting Physician (Pulmonary Disease) Marc Morgans, DMD, PA as  Referring Physician (Dentistry) Charlton Haws, [facility] as Pharmacist (Pharmacist)  Recent office visits: 11/19/21 NP Romilda Garret OV: cellulitis - clinical improvement.  11/15/21 NP Matt Charmian Muff OV: insect bite - rx doxy, hydroxyzine. 08/23/21 NP Tabitha Dugal OV: cellulitis (insect bite) - rx doxy, silvadene, triamcinolone.  03/09/21 Loura Pardon, MD Diarrhea No med changes.  02/19/21 Loura Pardon, MD Annual Exam Change: Gabapentin 300 mg Take one in am, mid day and 2 at bedtime (vs 3 times daily).  02/15/21 AWV  Recent consult visits: 10/03/21 Urgent Care - abscess of back - rx bactrim.  06/21/21 PA Cadence Kathlen Mody (Cardiology): Afib - no changes.  06/01/21 Dr Posey Pronto (Rheumatology): Sjorgens - continue pilocarpine. No changes.  05/28/21 NP Tammy Parrett (Pulmonary): OSA - continue CPAP. No changes.  04/23/21 [facility] Traumatic Hematoma of right forearm. No med changes.  03/15/21 Jennings Books, MD (Neurology): Right carpal tunnel syndrome. Referral to Physical Therapy. 03/05/21 [facility] Diarrhea Start: Zithromax 250 mg.  02/24/21 Jennings Books, MD (Neurology): Left carpal tunnel syndrome. Procedure: Carpal Tunnel injection. Administered: 0.75 ml__ Triamcinolone Acetonide (40 mg/ml) 02/17/21 Jennings Books, MD (Neurology): Left carpal tunnel syndrome. Procedure: Carpal Tunnel injection. Administered: 0.75 ml__ Triamcinolone Acetonide (40 mg/ml) 01/18/21 Audiology: Hearing test 01/04/21 Mayur Posey Pronto, MD (Rheumatology): Sjogren's syndrome. F/U in 4 months.  12/22/20 Kathlyn Sacramento, MD (Cardiology): Permanent Atrial Fibrillation: EKG. No med changes.  12/18/20 Chesley Mires, MD (Pulmonology): Referral for CPAP 12/08/20 Karl Ito, NP (Pain Medicine): Bronchitis: Start: Zithromax 250 mg. Start: Tessalon 200 mg. Stop (change therapy): Ezetimibe 10 mg.   Hospital visits: None in previous 6 months   Objective:  Lab  Results  Component Value Date   CREATININE 0.53 (L)  03/05/2021   BUN 9 03/05/2021   GFR 86.53 02/12/2021   EGFR 96 03/05/2021   GFRNONAA >60 06/15/2020   GFRAA 99 09/11/2019   NA 137 03/05/2021   K 4.2 03/05/2021   CALCIUM 7.5 (L) 03/05/2021   CO2 22 03/05/2021   GLUCOSE 105 (H) 03/05/2021    Lab Results  Component Value Date/Time   HGBA1C 6.5 (A) 08/23/2021 08:00 AM   HGBA1C 6.1 (A) 05/24/2021 08:09 AM   HGBA1C 6.7 (H) 02/12/2021 09:56 AM   HGBA1C 6.1 (H) 06/14/2020 03:01 AM   GFR 86.53 02/12/2021 09:56 AM   GFR 87.48 02/12/2020 08:05 AM    Last diabetic Eye exam: No results found for: "HMDIABEYEEXA"  Last diabetic Foot exam: No results found for: "HMDIABFOOTEX"   Lab Results  Component Value Date   CHOL 143 02/12/2021   HDL 46.40 02/12/2021   LDLCALC 81 02/12/2021   LDLDIRECT 148.9 08/20/2012   TRIG 80.0 02/12/2021   CHOLHDL 3 02/12/2021       Latest Ref Rng & Units 03/05/2021   10:20 AM 02/12/2021    9:56 AM 10/21/2020   10:42 AM  Hepatic Function  Total Protein 6.0 - 8.5 g/dL 6.9  7.0  7.0   Albumin 3.7 - 4.7 g/dL 4.1  4.1  3.8   AST 0 - 40 IU/L '18  17  14   ' ALT 0 - 32 IU/L '16  16  14   ' Alk Phosphatase 44 - 121 IU/L 82  76  67   Total Bilirubin 0.0 - 1.2 mg/dL 0.4  0.4  0.7   Bilirubin, Direct 0.0 - 0.2 mg/dL   0.1     Lab Results  Component Value Date/Time   TSH 0.86 02/12/2021 09:56 AM   TSH 0.744 06/14/2020 09:49 AM   TSH 0.81 01/21/2019 08:52 AM       Latest Ref Rng & Units 03/05/2021   10:20 AM 02/12/2021    9:56 AM 08/21/2020    9:17 AM  CBC  WBC 3.4 - 10.8 x10E3/uL 8.3  8.1  7.5   Hemoglobin 11.1 - 15.9 g/dL 14.0  14.1  13.7   Hematocrit 34.0 - 46.6 % 43.3  43.1  45.0   Platelets 150 - 450 x10E3/uL 265  250.0  219     Lab Results  Component Value Date/Time   VD25OH 46.49 02/12/2021 09:56 AM   VD25OH 53.34 02/12/2020 08:05 AM    Clinical ASCVD: Yes  The ASCVD Risk score (Arnett DK, et al., 2019) failed to calculate for the following reasons:   The patient has a prior MI or stroke diagnosis        02/15/2021    2:51 PM 02/14/2020    2:49 PM 01/21/2019   12:03 PM  Depression screen PHQ 2/9  Decreased Interest 0 0 0  Down, Depressed, Hopeless 0 0 0  PHQ - 2 Score 0 0 0  Altered sleeping  0 0  Tired, decreased energy  0 0  Change in appetite  0 0  Feeling bad or failure about yourself   0 0  Trouble concentrating  0 0  Moving slowly or fidgety/restless  0 0  Suicidal thoughts  0 0  PHQ-9 Score  0 0  Difficult doing work/chores  Not difficult at all Not difficult at all     CHA2DS2/VAS Stroke Risk Points  Current as of 9 minutes ago     5 >=  2 Points: High Risk  1 - 1.99 Points: Medium Risk  0 Points: Low Risk    No Change      Points Metrics  0 Has Congestive Heart Failure:  No    Current as of 9 minutes ago  0 Has Vascular Disease:  No    Current as of 9 minutes ago  0 Has Hypertension:  No    Current as of 9 minutes ago  2 Age:  67    Current as of 9 minutes ago  0 Has Diabetes:  No    Current as of 9 minutes ago  2 Had Stroke:  Yes  Had TIA:  No  Had Thromboembolism:  No    Current as of 9 minutes ago  1 Female:  Yes    Current as of 9 minutes ago    Social History   Tobacco Use  Smoking Status Never  Smokeless Tobacco Never   BP Readings from Last 3 Encounters:  11/19/21 118/82  11/15/21 126/82  10/03/21 113/81   Pulse Readings from Last 3 Encounters:  11/19/21 100  11/15/21 80  10/03/21 97   Wt Readings from Last 3 Encounters:  11/19/21 250 lb 2 oz (113.5 kg)  11/15/21 252 lb 6 oz (114.5 kg)  10/03/21 250 lb (113.4 kg)   BMI Readings from Last 3 Encounters:  11/19/21 39.18 kg/m  11/15/21 39.53 kg/m  10/03/21 39.16 kg/m    Assessment/Interventions: Review of patient past medical history, allergies, medications, health status, including review of consultants reports, laboratory and other test data, was performed as part of comprehensive evaluation and provision of chronic care management services.   SDOH:  (Social Determinants of  Health) assessments and interventions performed: No - completed Jan 2023 AWV SDOH Interventions    Flowsheet Row Clinical Support from 02/14/2020 in [facility] at [place] from 01/21/2019 in [facility] at [place] Interventions    Depression Interventions/Treatment  PFX9-0 Score <4 Follow-up Not Indicated PHQ2-9 Score <4 Follow-up Not Indicated      SDOH Screenings   Food Insecurity: No Food Insecurity (02/15/2021)  Housing: Low Risk  (02/15/2021)  Transportation Needs: No Transportation Needs (02/15/2021)  Alcohol Screen: Low Risk  (02/15/2021)  Depression (PHQ2-9): Low Risk  (02/15/2021)  Financial Resource Strain: Low Risk  (02/15/2021)  Physical Activity: Insufficiently Active (02/15/2021)  Social Connections: Moderately Integrated (02/15/2021)  Stress: No Stress Concern Present (02/15/2021)  Tobacco Use: Low Risk  (10/03/2021)    CCM Care Plan  Allergies  Allergen Reactions   Crestor [Rosuvastatin]     myalgias   Lipitor [Atorvastatin]     myalgias   Statins     joint pain   Wasp Venom Swelling    Medications Reviewed Today     Reviewed by Kris Mouton, CMA (Certified Medical Assistant) on 11/19/21 at 53  Med List Status: <None>   Medication Order Taking? Sig Documenting Provider Last Dose Status Informant  acetaminophen (TYLENOL) 500 MG tablet [id number] Yes Take 1,000 mg by mouth every 6 (six) hours as needed for moderate pain. [provider] Taking Active Self  apixaban (ELIQUIS) 5 MG TABS tablet [id number] Yes Take 1 tablet by mouth twice daily Wellington Hampshire, MD Taking Active   Carboxymethylcellul-Glycerin (LUBRICATING EYE DROPS OP) [id number] Yes Place 1 drop into both eyes 5 (five) times daily. [provider] Taking Active Self  cetirizine (ZYRTEC) 10 MG tablet [id number] Yes Take 10 mg by mouth at bedtime. [provider] Taking Active Self  doxycycline (VIBRA-TABS) 100 MG tablet [id number] Yes  Take 1 tablet (100 mg total) by mouth 2 (two) times daily for 7 days. Michela Pitcher, NP Taking Active   DULoxetine (CYMBALTA) 30 MG capsule [id number] Yes Take 1 capsule (30 mg total) by mouth daily. Tower, Wynelle Fanny, MD Taking Active   EPINEPHrine 0.3 mg/0.3 mL IJ SOAJ injection [id number] Yes Inject 0.3 mLs (0.3 mg total) into the muscle once.  Patient taking differently: Inject 0.3 mg into the muscle as needed for anaphylaxis.   Tower, Wynelle Fanny, MD Taking Active   ezetimibe (ZETIA) 10 MG tablet [id number] Yes Take 1 tablet (10 mg total) by mouth daily. Tower, Wynelle Fanny, MD Taking Active   Fish Oil-Cholecalciferol (OMEGA-3 + VITAMIN D3 PO) [id number] Yes Take 2 capsules by mouth daily.  [provider] Taking Active Self  fluticasone (FLONASE) 50 MCG/ACT nasal spray [id number] Yes Place into both nostrils daily as needed for allergies or rhinitis. [provider] Taking Active   gabapentin (NEURONTIN) 300 MG capsule [id number] Yes Take one by mouth in the am, one mid day and 2 at bedtime Tower, Wynelle Fanny, MD Taking Active   hydrOXYzine (ATARAX) 10 MG tablet [id number] Yes Take 1 tablet (10 mg total) by mouth 2 (two) times daily as needed. DO NOT TAKE WITH BENADRYL Michela Pitcher, NP Taking Active   methocarbamol (ROBAXIN) 500 MG tablet [id number] Yes Take 1 tablet (500 mg total) by mouth 2 (two) times daily as needed for muscle spasms. Cauttion of sedation Tower, Wynelle Fanny, MD Taking Active   metoprolol tartrate (LOPRESSOR) 25 MG tablet [id number] Yes Take 1 tablet by mouth twice daily Wellington Hampshire, MD Taking Active   omeprazole (PRILOSEC) 20 MG capsule [id number] Yes Take 20 mg by mouth daily. [provider] Taking Active Self  oxybutynin (OXYTROL) 3.9 MG/24HR [id number] Yes Place 1 patch onto the skin 2 (two) times a week. Tower, Wynelle Fanny, MD Taking Active   pilocarpine (SALAGEN) 5 MG tablet [id number] Yes Take 1 tablet (5 mg total) by mouth 3 (three) times daily. Tower, Wynelle Fanny, MD  Taking Active   REPATHA SURECLICK 482 MG/ML Darden Palmer [id number] Yes INJECT 1 DOSE (140MG) UNDER THE SKIN EVERY 14 DAYS Wellington Hampshire, MD Taking Active   silver sulfADIAZINE (SILVADENE) 1 % cream [id number] Yes Apply 1 Application topically daily. Eugenia Pancoast, FNP Taking Active   vitamin B-12 (CYANOCOBALAMIN) 1000 MCG tablet [id number] Yes Take 1,000 mcg by mouth daily. [provider] Taking Active   vitamin C (ASCORBIC ACID) 500 MG tablet [id number] Yes Take 500 mg by mouth daily. [provider] Taking Active   zinc gluconate 50 MG tablet [id number] Yes Take 50 mg by mouth daily. [provider] Taking Active             Patient Active Problem List   Diagnosis Date Noted   Cellulitis of left lower extremity 11/15/2021   Insect bite of left foot 11/15/2021   Erythema due to burn (first degree) of upper arm, right, initial encounter 08/23/2021   Cellulitis of right lower extremity 08/23/2021   IBS (irritable bowel syndrome) 05/24/2021   Lump in neck 03/09/2021   Atrial fibrillation ([place]) 09/29/2020   Myofascial pain 09/29/2020   History of CVA (cerebrovascular accident) 06/13/2020   DNR (do not resuscitate) 06/13/2020   Elevated blood pressure reading 01/28/2020   Dermatochalasis of both upper eyelids 07/15/2019   Ptosis of both upper eyelids 07/15/2019  Carpal tunnel syndrome 01/25/2019   Age-related nuclear cataract, bilateral 06/19/2018   Vitamin B12 deficiency 03/27/2018   Sjogren's disease ([place]) 01/15/2018   Osteoarthritis 01/15/2018   Obesity (BMI 30-39.9) 01/15/2018   H/O herpes zoster 11/22/2017   Estrogen deficiency 01/09/2017   Routine general medical examination at a health care facility 01/01/2017   OSA (obstructive sleep apnea) 09/18/2016   Bee sting allergy 06/10/2014   Morbid obesity ([place]) 06/10/2014   Encounter for Medicare annual wellness exam 12/03/2013   History of shingles 11/27/2013   Hx of breast cancer 08/22/2011    Gynecological examination 02/14/2011   Bilateral dry eyes 01/20/2011   Dry eye syndrome of bilateral lacrimal glands 01/20/2011   Vitamin D deficiency 04/22/2008   Hyperlipidemia 04/22/2008   GERD 04/22/2008   Prediabetes 04/22/2008    Immunization History  Administered Date(s) Administered   HPV Bivalent 11/10/2016   Influenza Split 11/26/2010   Influenza Whole 12/06/2009   Influenza, High Dose Seasonal PF 12/31/2019, 11/02/2020   Influenza, Seasonal, Injecte, Preservative Fre 10/23/2015   Influenza,inj,Quad PF,6+ Mos 11/26/2013, 11/10/2014, 11/10/2016, 11/22/2017, 10/30/2018   Influenza-Unspecified 11/26/2013, 11/10/2014, 11/10/2016, 11/22/2017, 10/30/2018   PFIZER(Purple Top)SARS-COV-2 Vaccination 02/28/2019, 03/21/2019, 12/27/2019   Pneumococcal Conjugate-13 12/24/2014, 05/31/2016   Pneumococcal Polysaccharide-23 01/19/2010, 01/08/2018   Td 10/13/2006   Zoster Recombinat (Shingrix) 05/31/2016, 08/23/2016   Zoster, Live 02/07/2005    Conditions to be addressed/monitored:  Hyperlipidemia, Atrial Fibrillation, and Overactive Bladder, Sjorgen's, Hx stroke, Prediabetes  There are no care plans that you recently modified to display for this patient.     Medication Assistance: None required.  Patient affirms current coverage meets needs.  Compliance/Adherence/Medication fill history: Care Gaps: None  Star-Rating Drugs: None  Medication Access: Within the past 30 days, how often has patient missed a dose of medication? *** Is a pillbox or other method used to improve adherence? {YES/NO:21197} Factors that may affect medication adherence? {CHL DESC; BARRIERS:21522} Are meds synced by current pharmacy? {YES/NO:21197} Are meds delivered by current pharmacy? {YES/NO:21197} Does patient experience delays in picking up medications due to transportation concerns? {YES/NO:21197}  [facility] Reviewed: Is patient disadvantaged to use [facility]?:  {YES/NO:21197} Current Rx insurance plan: *** Name and location of Current pharmacy:  [facility] [address] [place] [address] Phone: [phone] Fax: [phone]  [id number] [place] [place]Phone: [phone]: [phone] [facility] services reviewed with patient today?: {YES/NO:21197} Patient requests to transfer care to [facility]?: {YES/NO:21197} Reason patient declined to change pharmacies: {US patient preference:[id number]}   Care Plan and Follow Up Patient Decision:  Patient agrees to Care Plan and Follow-up.  Plan: Telephone follow up appointment with care management team member scheduled for:  6 months  LCharlene Brooke PharmD, BCACP Clinical Pharmacist [phone]   Current Barriers:  LDL above goal  Pharmacist Clinical Goal(s):  Patient will contact provider office for questions/concerns as evidenced notation of same in electronic health record through collaboration with PharmD and provider.   Interventions: 1:1 collaboration with Tower, MWynelle Fanny MD regarding development and update of comprehensive plan of care as evidenced by provider attestation and co-signature Inter-disciplinary care team collaboration (see longitudinal plan of care) Comprehensive medication review performed; medication list updated in electronic medical record  Hyperlipidemia: (LDL goal < 70) -Query controlled - LDL 81 (02/2021), ezetimibe was added afterwards and lipids have not been rechecked yet;  -  Hx embolic stroke 09/3149. Repatha started 08/2020, Ezetimibe started 02/2021 -Current treatment: Repatha 140 mg q14 days - Appropriate, Query Effective Ezetimibe 10 mg daily -Appropriate, Query Effective OTC Fish Oil + Vitamin D -Appropriate, Effective, Safe, Accessible -Medications previously tried: rosuvastatin,  atorvastatin  -Educated on Cholesterol goals; Exercise goal of 150 minutes per week; -Recommended to continue current medication; repeat lipid panel with cardiology  Atrial Fibrillation (Goal: prevent stroke and major bleeding) -Controlled -  pt reports Eliquis cost has not been an issue; she denies s/sx bleeding -CHADSVASC: 5; hx embolic stroke 08/6158. Successful cardioversion July 2022.  -Current treatment: Eliquis 5 mg BID - Appropriate, Effective, Safe, Accessible Metoprolol tartrate 25 mg BID - Appropriate, Effective, Safe, Accessible -Medications previously tried: n/a -Counseled on increased risk of stroke due to Afib and benefits of anticoagulation for stroke prevention; importance of adherence to anticoagulant exactly as prescribed; bleeding risk associated with Eliquis and importance of self-monitoring for signs/symptoms of bleeding; avoidance of NSAIDs due to increased bleeding risk with anticoagulants; seeking medical attention after a head injury or if there is blood in the urine/stool; -Recommended to continue current medication  Prediabetes (Goal: A1c < 6.5%) -Controlled - A1c 6.1% (05/2021) -Counseled on diet and exercise extensively  Chronic pain (Goal: manage symptoms) -Controlled - although pt reports trouble sleeping, she is taking duloxetine at night, norephineprine activity can lead to insomnia in some cases -Sjorgen's syndrome. Follows with Rheumatology -Current treatment  Duloxetine 30 mg daily PM- Appropriate, Effective, Safe, Accessible Tylenol 500 mg PRN -Appropriate, Effective, Safe, Accessible Gabapentin 300 mg - 1 AM, 1 PM, 2 HS -Appropriate, Effective, Safe, Accessible -Medications previously tried: n/a  -Recommended to continue current medication; advised to move duloxetine to AM and try melatonin at bedtime to help with sleep  GERD (Goal: manage symptoms) -Controlled -Current treatment  Omeprazole 20 mg daily - Appropriate, Effective, Safe,  Accessible -Medications previously tried: n/a  -Recommended to continue current medication  OAB (Goal: manage symptoms) -Controlled - pt reports significant improvement on OTC patch, this is not covered by her insurance but she is willing to buy it -Current treatment  Oxybutynin patch 3.9 mg/24hr 2x weekly - Appropriate, Effective, Safe, Accessible -Medications previously tried: n/a  -Notably, pt has Sjorgens and chronic issues with dry mouth/dry eye; discussed oxybutynin can worsen these issues, however she is tolerating reasonably well right now and agrees benefits > risks -Recommended to continue current medication  Health Maintenance -Vaccine gaps: none -Hx Sjorgens - taking pilocarpine consistently which helps a lot per patient -Discussed insurance coverage gap (donut hole) - this is the first year she is on 2 brand-name meds (Eliquis and Repatha) and she will likely enter donut hole for first time this year, explained what this is and that it will end at end of year and revert back to regular copays January 1; pt voiced understanding and will contact prescribers if she runs into significant cost issues -Current therapy:  Cetirizine 10 mg Epi-Pen PRN Pilocarpine 5 mg TID (dry mouth - Sjorgens) Lubcricating eye drops -Patient is satisfied with current therapy and denies issues -Recommended to continue current medication  Patient Goals/Self-Care Activities Patient will:  - take medications as prescribed as evidenced by patient report and record review focus on medication adherence by pill box collaborate with provider on medication access solutions

## 2021-11-25 DIAGNOSIS — Z23 Encounter for immunization: Secondary | ICD-10-CM | POA: Diagnosis not present

## 2021-11-29 DIAGNOSIS — G5602 Carpal tunnel syndrome, left upper limb: Secondary | ICD-10-CM | POA: Diagnosis not present

## 2021-11-29 DIAGNOSIS — E113393 Type 2 diabetes mellitus with moderate nonproliferative diabetic retinopathy without macular edema, bilateral: Secondary | ICD-10-CM | POA: Diagnosis not present

## 2021-11-30 DIAGNOSIS — G3184 Mild cognitive impairment, so stated: Secondary | ICD-10-CM | POA: Diagnosis not present

## 2021-11-30 DIAGNOSIS — Z8679 Personal history of other diseases of the circulatory system: Secondary | ICD-10-CM | POA: Diagnosis not present

## 2021-11-30 DIAGNOSIS — M1990 Unspecified osteoarthritis, unspecified site: Secondary | ICD-10-CM | POA: Diagnosis not present

## 2021-11-30 DIAGNOSIS — G4733 Obstructive sleep apnea (adult) (pediatric): Secondary | ICD-10-CM | POA: Diagnosis not present

## 2021-11-30 DIAGNOSIS — Z8673 Personal history of transient ischemic attack (TIA), and cerebral infarction without residual deficits: Secondary | ICD-10-CM | POA: Diagnosis not present

## 2021-11-30 DIAGNOSIS — G5603 Carpal tunnel syndrome, bilateral upper limbs: Secondary | ICD-10-CM | POA: Diagnosis not present

## 2021-11-30 DIAGNOSIS — G939 Disorder of brain, unspecified: Secondary | ICD-10-CM | POA: Diagnosis not present

## 2021-12-01 NOTE — Telephone Encounter (Signed)
Contacted the patient and rescheduled telephone appointment with CPP.  [name], CPP notified  Lauren [name], [place]  [phone]

## 2021-12-17 ENCOUNTER — Telehealth: Payer: Self-pay

## 2021-12-17 NOTE — Chronic Care Management (AMB) (Signed)
    Chronic Care Management Pharmacy Assistant   Name: Lauren [name]  MRN: [id number] DOB: [birth year]  Reason for Encounter: Reminder Call    Medications: Outpatient Encounter Medications as of 12/17/2021  Medication Sig   acetaminophen (TYLENOL) 500 MG tablet Take 1,000 mg by mouth every 6 (six) hours as needed for moderate pain.   apixaban (ELIQUIS) 5 MG TABS tablet Take 1 tablet by mouth twice daily   Carboxymethylcellul-Glycerin (LUBRICATING EYE DROPS OP) Place 1 drop into both eyes 5 (five) times daily.   cetirizine (ZYRTEC) 10 MG tablet Take 10 mg by mouth at bedtime.   DULoxetine (CYMBALTA) 30 MG capsule Take 1 capsule (30 mg total) by mouth daily.   EPINEPHrine 0.3 mg/0.3 mL IJ SOAJ injection Inject 0.3 mLs (0.3 mg total) into the muscle once. (Patient taking differently: Inject 0.3 mg into the muscle as needed for anaphylaxis.)   ezetimibe (ZETIA) 10 MG tablet Take 1 tablet (10 mg total) by mouth daily.   Fish Oil-Cholecalciferol (OMEGA-3 + VITAMIN D3 PO) Take 2 capsules by mouth daily.    fluticasone (FLONASE) 50 MCG/ACT nasal spray Place into both nostrils daily as needed for allergies or rhinitis.   gabapentin (NEURONTIN) 300 MG capsule Take one by mouth in the am, one mid day and 2 at bedtime   hydrOXYzine (ATARAX) 10 MG tablet Take 1 tablet (10 mg total) by mouth 2 (two) times daily as needed. DO NOT TAKE WITH BENADRYL   methocarbamol (ROBAXIN) 500 MG tablet Take 1 tablet (500 mg total) by mouth 2 (two) times daily as needed for muscle spasms. Cauttion of sedation   metoprolol tartrate (LOPRESSOR) 25 MG tablet Take 1 tablet by mouth twice daily   omeprazole (PRILOSEC) 20 MG capsule Take 20 mg by mouth daily.   oxybutynin (OXYTROL) 3.9 MG/24HR Place 1 patch onto the skin 2 (two) times a week.   pilocarpine (SALAGEN) 5 MG tablet Take 1 tablet (5 mg total) by mouth 3 (three) times daily.   REPATHA SURECLICK 811 MG/ML SOAJ INJECT 1 DOSE ('140MG'$ ) UNDER THE SKIN EVERY 14 DAYS    silver sulfADIAZINE (SILVADENE) 1 % cream Apply 1 Application topically daily.   vitamin B-12 (CYANOCOBALAMIN) 1000 MCG tablet Take 1,000 mcg by mouth daily.   vitamin C (ASCORBIC ACID) 500 MG tablet Take 500 mg by mouth daily.   zinc gluconate 50 MG tablet Take 50 mg by mouth daily.   No facility-administered encounter medications on file as of 12/17/2021.  Lauren [name] was contacted to remind of upcoming telephone visit with [name] on 12/22/21 at 10:15. Patient was reminded to have any blood glucose and blood pressure readings available for review at appointment.   No VM box set up   CCM referral has been placed prior to visit?  No   Star Rating Drugs: Medication:  Last Fill: Day Supply No star medications identified    [name], CPP notified  Avel Sensor, [place]  [phone]

## 2021-12-19 ENCOUNTER — Other Ambulatory Visit: Payer: Self-pay | Admitting: Cardiovascular Disease

## 2021-12-21 DIAGNOSIS — M7981 Nontraumatic hematoma of soft tissue: Secondary | ICD-10-CM | POA: Diagnosis not present

## 2021-12-21 DIAGNOSIS — Z08 Encounter for follow-up examination after completed treatment for malignant neoplasm: Secondary | ICD-10-CM | POA: Diagnosis not present

## 2021-12-21 DIAGNOSIS — G5603 Carpal tunnel syndrome, bilateral upper limbs: Secondary | ICD-10-CM | POA: Diagnosis not present

## 2021-12-21 DIAGNOSIS — Z85828 Personal history of other malignant neoplasm of skin: Secondary | ICD-10-CM | POA: Diagnosis not present

## 2021-12-22 ENCOUNTER — Ambulatory Visit: Payer: Medicare Other | Admitting: Pharmacist

## 2021-12-22 DIAGNOSIS — I4821 Permanent atrial fibrillation: Secondary | ICD-10-CM

## 2021-12-22 DIAGNOSIS — R7303 Prediabetes: Secondary | ICD-10-CM

## 2021-12-22 DIAGNOSIS — E7800 Pure hypercholesterolemia, unspecified: Secondary | ICD-10-CM

## 2021-12-22 DIAGNOSIS — Z8673 Personal history of transient ischemic attack (TIA), and cerebral infarction without residual deficits: Secondary | ICD-10-CM

## 2021-12-22 NOTE — Patient Instructions (Signed)
Visit Information  Phone number for Pharmacist: [phone]   Goals Addressed   None     Care Plan : Pharmacy Care Plan  Updates made by Charlton Haws, RPH since 12/22/2021 12:00 AM     Problem: Hyperlipidemia, Atrial Fibrillation, and Overactive Bladder, Sjorgen's, Hx stroke   Priority: High     Long-Range Goal: Disease mgmt   Start Date: 05/24/2021  Expected End Date: 06/02/2022  This Visit's Progress: On track  Recent Progress: On track  Priority: High  Note:   Current Barriers:  LDL above goal A1c in diabetic range  Pharmacist Clinical Goal(s):  Patient will contact provider office for questions/concerns as evidenced notation of same in electronic health record through collaboration with PharmD and provider.   Interventions: 1:1 collaboration with Tower, Wynelle Fanny, MD regarding development and update of comprehensive plan of care as evidenced by provider attestation and co-signature Inter-disciplinary care team collaboration (see longitudinal plan of care) Comprehensive medication review performed; medication list updated in electronic medical record  Hyperlipidemia: (LDL goal < 70) -Query controlled - LDL 81 (02/2021), ezetimibe was added afterwards and lipids have not been rechecked yet;  -Hx embolic stroke 05/1636. Repatha started 08/2020, Ezetimibe started 02/2021 -Current treatment: Repatha 140 mg q14 days - Appropriate, Query Effective Ezetimibe 10 mg daily -Appropriate, Query Effective OTC Fish Oil + Vitamin D -Appropriate, Effective, Safe, Accessible -Medications previously tried: rosuvastatin, atorvastatin  -Educated on Cholesterol goals; Exercise goal of 150 minutes per week; -Recommended to continue current medication; repeat lipid panel with cardiology  Atrial Fibrillation (Goal: prevent stroke and major bleeding) -Controlled -  pt reports Eliquis cost has not been an issue; she denies s/sx bleeding -CHADSVASC: 5; hx embolic stroke 05/5362. Successful  cardioversion July 2022.  -Current treatment: Eliquis 5 mg BID - Appropriate, Effective, Safe, Accessible Metoprolol tartrate 25 mg BID - Appropriate, Effective, Safe, Accessible -Medications previously tried: n/a -Counseled on increased risk of stroke due to Afib and benefits of anticoagulation for stroke prevention; importance of adherence to anticoagulant exactly as prescribed; bleeding risk associated with Eliquis and importance of self-monitoring for signs/symptoms of bleeding; avoidance of NSAIDs due to increased bleeding risk with anticoagulants; seeking medical attention after a head injury or if there is blood in the urine/stool; -Recommended to continue current medication  Prediabetes (Goal: A1c < 6.5%) -Controlled - A1c 6.5% (08/2021), increased from 05/2021. -Counseled on diet and exercise extensively -Recommend repeat A1c at next appt (03/2022)  Chronic pain (Goal: manage symptoms) -Controlled - pt reports improvement in sleep after moving duloxetine from PM to AM dosing -Sjorgen's syndrome. Follows with Rheumatology -Current treatment  Duloxetine 30 mg daily AM- Appropriate, Effective, Safe, Accessible Tylenol 500 mg PRN -Appropriate, Effective, Safe, Accessible Gabapentin 300 mg - 1 AM, 1 PM, 2 HS -Appropriate, Effective, Safe, Accessible Methocarbamol 500 mg BID - Appropriate, Effective, Safe, Accessible -Medications previously tried: n/a  -Recommended to continue current medication  GERD (Goal: manage symptoms) -Controlled -Current treatment  Omeprazole 20 mg daily - Appropriate, Effective, Safe, Accessible -Medications previously tried: n/a  -Recommended to continue current medication  OAB (Goal: manage symptoms) -Controlled - pt reports significant improvement on OTC patch, this is not covered by her insurance but she is willing to buy it -Current treatment  Oxybutynin patch 3.9 mg/24hr 2x weekly - Appropriate, Effective, Safe, Accessible -Medications previously  tried: n/a  -Notably, pt has Sjorgens and chronic issues with dry mouth/dry eye; discussed oxybutynin can worsen these issues, however she is tolerating reasonably well right now and  agrees benefits > risks -Recommended to continue current medication  Health Maintenance -Vaccine gaps: none -Hx Sjorgens - taking pilocarpine consistently which helps a lot per patient -Discussed insurance coverage gap (donut hole) - this is the first year she is on 2 brand-name meds (Eliquis and Repatha) and she will likely enter donut hole for first time this year, explained what this is and that it will end at end of year and revert back to regular copays January 1; pt voiced understanding and will contact prescribers if she runs into significant cost issues -Current therapy:  Cetirizine 10 mg Epi-Pen PRN Pilocarpine 5 mg TID (dry mouth - Sjorgens) Lubcricating eye drops -Patient is satisfied with current therapy and denies issues -Recommended to continue current medication  Patient Goals/Self-Care Activities Patient will:  - take medications as prescribed as evidenced by patient report and record review focus on medication adherence by pill box collaborate with provider on medication access solutions      Patient verbalizes understanding of instructions and care plan provided today and agrees to view in [place]. Active MyChart status and patient understanding of how to access instructions and care plan via MyChart confirmed with patient.    Telephone follow up appointment with pharmacy team member scheduled for: PRN  [name], PharmD, BCACP Clinical Pharmacist [facility] [phone]

## 2021-12-22 NOTE — Progress Notes (Signed)
Chronic Care Management Pharmacy Note  12/22/2021 Name:  Lauren [name] MRN:  [id number] DOB:  1946-01-20  Summary: CCM F/U visit -LDL 81 (03/2021), goal is < 70 given hx of stroke. Ezetimibe was added after most recent labwork, updated lipid panel needed to assess efficacy -DM: A1c 6.5% (08/2021) now in diabetic range; pt has been working on diet/exercise since then to improve  Recommendations/Changes made from today's visit: -No med changes -Recommend repeat lipid panel and A1c at next appt  Plan: -[facility] [place] will call patient 6 months for general update -Pharmacist follow up PRN -PCP annual visit 03/28/22    Subjective: Lauren [name] is an 76 y.o. year old female who is a primary patient of Tower, Wynelle Fanny, MD.  The CCM team was consulted for assistance with disease management and care coordination needs.    Engaged with patient by telephone for follow up visit in response to provider referral for pharmacy case management and/or care coordination services.   Consent to Services:  The patient was given information about Chronic Care Management services, agreed to services, and gave verbal consent prior to initiation of services.  Please see initial visit note for detailed documentation.   Patient Care Team: Tower, Wynelle Fanny, MD as PCP - General Wellington Hampshire, MD as PCP - Cardiology (Cardiology) Sharol Roussel, [place] as Physician Assistant (Optometry) Lorelee Cover., MD as Referring Physician (Ophthalmology) Laverle Hobby, MD as Consulting Physician (Pulmonary Disease) Marc Morgans, DMD, PA as Referring Physician (Dentistry) Charlton Haws, [facility] as Pharmacist (Pharmacist)  Recent office visits: 11/19/21 NP Romilda Garret OV: cellulitis - clinical improvement.  11/15/21 NP Matt Charmian Muff OV: insect bite - rx doxy, hydroxyzine. 08/23/21 NP Tabitha Dugal OV: cellulitis (insect bite) - rx doxy, silvadene, triamcinolone.  03/09/21 Loura Pardon, MD Diarrhea No med  changes.  02/19/21 Loura Pardon, MD Annual Exam Change: Gabapentin 300 mg Take one in am, mid day and 2 at bedtime (vs 3 times daily).  02/15/21 AWV  Recent consult visits: 11/30/21 Dr Manuella Ghazi (Neurology): f/u stroke. No changes  10/03/21 Urgent Care - abscess of back - rx bactrim.  06/21/21 PA Cadence Kathlen Mody (Cardiology): Afib - no changes.  06/01/21 Dr Posey Pronto (Rheumatology): Sjorgens - continue pilocarpine. No changes.  05/28/21 NP Tammy Parrett (Pulmonary): OSA - continue CPAP. No changes.  04/23/21 [facility] Traumatic Hematoma of right forearm. No med changes.  03/15/21 Jennings Books, MD (Neurology): Right carpal tunnel syndrome. Referral to Physical Therapy. 03/05/21 [facility] Diarrhea Start: Zithromax 250 mg.  02/24/21 Jennings Books, MD (Neurology): Left carpal tunnel syndrome. Procedure: Carpal Tunnel injection. Administered: 0.75 ml__ Triamcinolone Acetonide (40 mg/ml) 02/17/21 Jennings Books, MD (Neurology): Left carpal tunnel syndrome. Procedure: Carpal Tunnel injection. Administered: 0.75 ml__ Triamcinolone Acetonide (40 mg/ml) 01/18/21 Audiology: Hearing test 01/04/21 Mayur Posey Pronto, MD (Rheumatology): Sjogren's syndrome. F/U in 4 months.  12/22/20 Kathlyn Sacramento, MD (Cardiology): Permanent Atrial Fibrillation: EKG. No med changes.  12/18/20 Chesley Mires, MD (Pulmonology): Referral for CPAP 12/08/20 Karl Ito, NP (Pain Medicine): Bronchitis: Start: Zithromax 250 mg. Start: Tessalon 200 mg. Stop (change therapy): Ezetimibe 10 mg.   Hospital visits: None in previous 6 months   Objective:  Lab Results  Component Value Date   CREATININE 0.53 (L) 03/05/2021   BUN 9 03/05/2021   GFR 86.53 02/12/2021   EGFR 96 03/05/2021   GFRNONAA >60 06/15/2020   GFRAA 99 09/11/2019   NA 137 03/05/2021   K 4.2 03/05/2021   CALCIUM 7.5 (L) 03/05/2021  CO2 22 03/05/2021   GLUCOSE 105 (H) 03/05/2021    Lab Results  Component Value Date/Time   HGBA1C 6.5 (A) 08/23/2021  08:00 AM   HGBA1C 6.1 (A) 05/24/2021 08:09 AM   HGBA1C 6.7 (H) 02/12/2021 09:56 AM   HGBA1C 6.1 (H) 06/14/2020 03:01 AM   GFR 86.53 02/12/2021 09:56 AM   GFR 87.48 02/12/2020 08:05 AM    Last diabetic Eye exam: No results found for: "HMDIABEYEEXA"  Last diabetic Foot exam: No results found for: "HMDIABFOOTEX"   Lab Results  Component Value Date   CHOL 143 02/12/2021   HDL 46.40 02/12/2021   LDLCALC 81 02/12/2021   LDLDIRECT 148.9 08/20/2012   TRIG 80.0 02/12/2021   CHOLHDL 3 02/12/2021       Latest Ref Rng & Units 03/05/2021   10:20 AM 02/12/2021    9:56 AM 10/21/2020   10:42 AM  Hepatic Function  Total Protein 6.0 - 8.5 g/dL 6.9  7.0  7.0   Albumin 3.7 - 4.7 g/dL 4.1  4.1  3.8   AST 0 - 40 IU/L _0 ALT 0 - 32 IU/L _1 Alk Phosphatase 44 - 121 IU/L 82  76  67   Total Bilirubin 0.0 - 1.2 mg/dL 0.4  0.4  0.7   Bilirubin, Direct 0.0 - 0.2 mg/dL   0.1     Lab Results  Component Value Date/Time   TSH 0.86 02/12/2021 09:56 AM   TSH 0.744 06/14/2020 09:49 AM   TSH 0.81 01/21/2019 08:52 AM       Latest Ref Rng & Units 03/05/2021   10:20 AM 02/12/2021    9:56 AM 08/21/2020    9:17 AM  CBC  WBC 3.4 - 10.8 x10E3/uL 8.3  8.1  7.5   Hemoglobin 11.1 - 15.9 g/dL 14.0  14.1  13.7   Hematocrit 34.0 - 46.6 % 43.3  43.1  45.0   Platelets 150 - 450 x10E3/uL 265  250.0  219     Lab Results  Component Value Date/Time   VD25OH 46.49 02/12/2021 09:56 AM   VD25OH 53.34 02/12/2020 08:05 AM    Clinical ASCVD: Yes  The ASCVD Risk score (Arnett DK, et al., 2019) failed to calculate for the following reasons:   The patient has a prior MI or stroke diagnosis       02/15/2021    2:51 PM 02/14/2020    2:49 PM 01/21/2019   12:03 PM  Depression screen PHQ 2/9  Decreased Interest 0 0 0  Down, Depressed, Hopeless 0 0 0  PHQ - 2 Score 0 0 0  Altered sleeping  0 0  Tired, decreased energy  0 0  Change in appetite  0 0  Feeling bad or failure about yourself   0 0  Trouble  concentrating  0 0  Moving slowly or fidgety/restless  0 0  Suicidal thoughts  0 0  PHQ-9 Score  0 0  Difficult doing work/chores  Not difficult at all Not difficult at all     CHA2DS2/VAS Stroke Risk Points  Current as of 9 minutes ago     5 >= 2 Points: High Risk  1 - 1.99 Points: Medium Risk  0 Points: Low Risk    No Change      Points Metrics  0 Has Congestive Heart Failure:  No    Current as of 9 minutes ago  0 Has Vascular Disease:  No  Current as of 9 minutes ago  0 Has Hypertension:  No    Current as of 9 minutes ago  2 Age:  64    Current as of 9 minutes ago  0 Has Diabetes:  No    Current as of 9 minutes ago  2 Had Stroke:  Yes  Had TIA:  No  Had Thromboembolism:  No    Current as of 9 minutes ago  1 Female:  Yes    Current as of 9 minutes ago    Social History   Tobacco Use  Smoking Status Never  Smokeless Tobacco Never   BP Readings from Last 3 Encounters:  11/19/21 118/82  11/15/21 126/82  10/03/21 113/81   Pulse Readings from Last 3 Encounters:  11/19/21 100  11/15/21 80  10/03/21 97   Wt Readings from Last 3 Encounters:  11/19/21 250 lb 2 oz (113.5 kg)  11/15/21 252 lb 6 oz (114.5 kg)  10/03/21 250 lb (113.4 kg)   BMI Readings from Last 3 Encounters:  11/19/21 39.18 kg/m  11/15/21 39.53 kg/m  10/03/21 39.16 kg/m    Assessment/Interventions: Review of patient past medical history, allergies, medications, health status, including review of consultants reports, laboratory and other test data, was performed as part of comprehensive evaluation and provision of chronic care management services.   SDOH:  (Social Determinants of Health) assessments and interventions performed: No - completed Jan 2023 AWV SDOH Interventions    Flowsheet Row Clinical Support from 02/14/2020 in [employer] at [place] from 01/21/2019 in [facility] at [place] Interventions    Depression Interventions/Treatment  YWV3-7  Score <4 Follow-up Not Indicated PHQ2-9 Score <4 Follow-up Not Indicated      SDOH Screenings   Food Insecurity: No Food Insecurity (02/15/2021)  Housing: Low Risk  (02/15/2021)  Transportation Needs: No Transportation Needs (02/15/2021)  Alcohol Screen: Low Risk  (02/15/2021)  Depression (PHQ2-9): Low Risk  (02/15/2021)  Financial Resource Strain: Low Risk  (02/15/2021)  Physical Activity: Insufficiently Active (02/15/2021)  Social Connections: Moderately Integrated (02/15/2021)  Stress: No Stress Concern Present (02/15/2021)  Tobacco Use: Low Risk  (10/03/2021)    CCM Care Plan  Allergies  Allergen Reactions   Crestor [Rosuvastatin]     myalgias   Lipitor [Atorvastatin]     myalgias   Statins     joint pain   Wasp Venom Swelling    Medications Reviewed Today     Reviewed by Kris Mouton, CMA (Certified Medical Assistant) on 11/19/21 at 25  Med List Status: <None>   Medication Order Taking? Sig Documenting Provider Last Dose Status Informant  acetaminophen (TYLENOL) 500 MG tablet [id number] Yes Take 1,000 mg by mouth every 6 (six) hours as needed for moderate pain. [provider] Taking Active Self  apixaban (ELIQUIS) 5 MG TABS tablet [id number] Yes Take 1 tablet by mouth twice daily Wellington Hampshire, MD Taking Active   Carboxymethylcellul-Glycerin (LUBRICATING EYE DROPS OP) [id number] Yes Place 1 drop into both eyes 5 (five) times daily. [provider] Taking Active Self  cetirizine (ZYRTEC) 10 MG tablet [id number] Yes Take 10 mg by mouth at bedtime. [provider] Taking Active Self  doxycycline (VIBRA-TABS) 100 MG tablet [id number] Yes Take 1 tablet (100 mg total) by mouth 2 (two) times daily for 7 days. Michela Pitcher, NP Taking Active   DULoxetine (CYMBALTA) 30 MG capsule [id number] Yes Take 1 capsule (30 mg total) by mouth daily. Tower, Bruno,  MD Taking Active   EPINEPHrine 0.3 mg/0.3 mL IJ SOAJ injection [id number] Yes Inject 0.3 mLs (0.3 mg total)  into the muscle once.  Patient taking differently: Inject 0.3 mg into the muscle as needed for anaphylaxis.   Tower, Wynelle Fanny, MD Taking Active   ezetimibe (ZETIA) 10 MG tablet [id number] Yes Take 1 tablet (10 mg total) by mouth daily. Tower, Wynelle Fanny, MD Taking Active   Fish Oil-Cholecalciferol (OMEGA-3 + VITAMIN D3 PO) [id number] Yes Take 2 capsules by mouth daily.  [provider] Taking Active Self  fluticasone (FLONASE) 50 MCG/ACT nasal spray [id number] Yes Place into both nostrils daily as needed for allergies or rhinitis. [provider] Taking Active   gabapentin (NEURONTIN) 300 MG capsule [id number] Yes Take one by mouth in the am, one mid day and 2 at bedtime Tower, Wynelle Fanny, MD Taking Active   hydrOXYzine (ATARAX) 10 MG tablet [id number] Yes Take 1 tablet (10 mg total) by mouth 2 (two) times daily as needed. DO NOT TAKE WITH BENADRYL Michela Pitcher, NP Taking Active   methocarbamol (ROBAXIN) 500 MG tablet [id number] Yes Take 1 tablet (500 mg total) by mouth 2 (two) times daily as needed for muscle spasms. Cauttion of sedation Tower, Wynelle Fanny, MD Taking Active   metoprolol tartrate (LOPRESSOR) 25 MG tablet [id number] Yes Take 1 tablet by mouth twice daily Wellington Hampshire, MD Taking Active   omeprazole (PRILOSEC) 20 MG capsule [id number] Yes Take 20 mg by mouth daily. [provider] Taking Active Self  oxybutynin (OXYTROL) 3.9 MG/24HR [id number] Yes Place 1 patch onto the skin 2 (two) times a week. Tower, Wynelle Fanny, MD Taking Active   pilocarpine (SALAGEN) 5 MG tablet [id number] Yes Take 1 tablet (5 mg total) by mouth 3 (three) times daily. Tower, Wynelle Fanny, MD Taking Active   REPATHA SURECLICK 035 MG/ML Darden Palmer [id number] Yes INJECT 1 DOSE (140MG) UNDER THE SKIN EVERY 14 DAYS Wellington Hampshire, MD Taking Active   silver sulfADIAZINE (SILVADENE) 1 % cream [id number] Yes Apply 1 Application topically daily. Eugenia Pancoast, FNP Taking Active   vitamin B-12 (CYANOCOBALAMIN) 1000 MCG tablet  [id number] Yes Take 1,000 mcg by mouth daily. [provider] Taking Active   vitamin C (ASCORBIC ACID) 500 MG tablet [id number] Yes Take 500 mg by mouth daily. [provider] Taking Active   zinc gluconate 50 MG tablet [id number] Yes Take 50 mg by mouth daily. [provider] Taking Active             Patient Active Problem List   Diagnosis Date Noted   Cellulitis of left lower extremity 11/15/2021   Insect bite of left foot 11/15/2021   Erythema due to burn (first degree) of upper arm, right, initial encounter 08/23/2021   Cellulitis of right lower extremity 08/23/2021   IBS (irritable bowel syndrome) 05/24/2021   Lump in neck 03/09/2021   Atrial fibrillation ([place]) 09/29/2020   Myofascial pain 09/29/2020   History of CVA (cerebrovascular accident) 06/13/2020   DNR (do not resuscitate) 06/13/2020   Elevated blood pressure reading 01/28/2020   Dermatochalasis of both upper eyelids 07/15/2019   Ptosis of both upper eyelids 07/15/2019   Carpal tunnel syndrome 01/25/2019   Age-related nuclear cataract, bilateral 06/19/2018   Vitamin B12 deficiency 03/27/2018   Sjogren's disease ([place]) 01/15/2018   Osteoarthritis 01/15/2018   Obesity (BMI 30-39.9) 01/15/2018   H/O herpes zoster 11/22/2017   Estrogen deficiency 01/09/2017   Routine general medical examination at a health care  facility 01/01/2017   OSA (obstructive sleep apnea) 09/18/2016   Bee sting allergy 06/10/2014   Morbid obesity ([place]) 06/10/2014   Encounter for Medicare annual wellness exam 12/03/2013   History of shingles 11/27/2013   Hx of breast cancer 08/22/2011   Gynecological examination 02/14/2011   Bilateral dry eyes 01/20/2011   Dry eye syndrome of bilateral lacrimal glands 01/20/2011   Vitamin D deficiency 04/22/2008   Hyperlipidemia 04/22/2008   GERD 04/22/2008   Prediabetes 04/22/2008    Immunization History  Administered Date(s) Administered   HPV Bivalent 11/10/2016    Influenza Split 11/26/2010   Influenza Whole 12/06/2009   Influenza, High Dose Seasonal PF 12/31/2019, 11/02/2020   Influenza, Seasonal, Injecte, Preservative Fre 10/23/2015   Influenza,inj,Quad PF,6+ Mos 11/26/2013, 11/10/2014, 11/10/2016, 11/22/2017, 10/30/2018   Influenza-Unspecified 11/26/2013, 11/10/2014, 11/10/2016, 11/22/2017, 10/30/2018   PFIZER(Purple Top)SARS-COV-2 Vaccination 02/28/2019, 03/21/2019, 12/27/2019   Pneumococcal Conjugate-13 12/24/2014, 05/31/2016   Pneumococcal Polysaccharide-23 01/19/2010, 01/08/2018   Td 10/13/2006   Zoster Recombinat (Shingrix) 05/31/2016, 08/23/2016   Zoster, Live 02/07/2005    Conditions to be addressed/monitored:  Hyperlipidemia, Atrial Fibrillation, and Overactive Bladder, Sjorgen's, Hx stroke, Prediabetes  Care Plan : Pharmacy Care Plan  Updates made by Charlton Haws, [place] since 12/22/2021 12:00 AM     Problem: Hyperlipidemia, Atrial Fibrillation, and Overactive Bladder, Sjorgen's, Hx stroke   Priority: High     Long-Range Goal: Disease mgmt   Start Date: 05/24/2021  Expected End Date: 06/02/2022  This Visit's Progress: On track  Recent Progress: On track  Priority: High  Note:   Current Barriers:  LDL above goal A1c in diabetic range  Pharmacist Clinical Goal(s):  Patient will contact provider office for questions/concerns as evidenced notation of same in electronic health record through collaboration with PharmD and provider.   Interventions: 1:1 collaboration with Tower, Wynelle Fanny, MD regarding development and update of comprehensive plan of care as evidenced by provider attestation and co-signature Inter-disciplinary care team collaboration (see longitudinal plan of care) Comprehensive medication review performed; medication list updated in electronic medical record  Hyperlipidemia: (LDL goal < 70) -Query controlled - LDL 81 (02/2021), ezetimibe was added afterwards and lipids have not been rechecked yet;  -Hx  embolic stroke 0/9407. Repatha started 08/2020, Ezetimibe started 02/2021 -Current treatment: Repatha 140 mg q14 days - Appropriate, Query Effective Ezetimibe 10 mg daily -Appropriate, Query Effective OTC Fish Oil + Vitamin D -Appropriate, Effective, Safe, Accessible -Medications previously tried: rosuvastatin, atorvastatin  -Educated on Cholesterol goals; Exercise goal of 150 minutes per week; -Recommended to continue current medication; repeat lipid panel with cardiology  Atrial Fibrillation (Goal: prevent stroke and major bleeding) -Controlled -  pt reports Eliquis cost has not been an issue; she denies s/sx bleeding -CHADSVASC: 5; hx embolic stroke 07/8086. Successful cardioversion July 2022.  -Current treatment: Eliquis 5 mg BID - Appropriate, Effective, Safe, Accessible Metoprolol tartrate 25 mg BID - Appropriate, Effective, Safe, Accessible -Medications previously tried: n/a -Counseled on increased risk of stroke due to Afib and benefits of anticoagulation for stroke prevention; importance of adherence to anticoagulant exactly as prescribed; bleeding risk associated with Eliquis and importance of self-monitoring for signs/symptoms of bleeding; avoidance of NSAIDs due to increased bleeding risk with anticoagulants; seeking medical attention after a head injury or if there is blood in the urine/stool; -Recommended to continue current medication  Prediabetes (Goal: A1c < 6.5%) -Controlled - A1c 6.5% (08/2021), increased from 05/2021. -Counseled on diet and exercise extensively -Recommend repeat A1c at next appt (03/2022)  Chronic pain (  Goal: manage symptoms) -Controlled - pt reports improvement in sleep after moving duloxetine from PM to AM dosing -Sjorgen's syndrome. Follows with Rheumatology -Current treatment  Duloxetine 30 mg daily AM- Appropriate, Effective, Safe, Accessible Tylenol 500 mg PRN -Appropriate, Effective, Safe, Accessible Gabapentin 300 mg - 1 AM, 1 PM, 2 HS  -Appropriate, Effective, Safe, Accessible Methocarbamol 500 mg BID - Appropriate, Effective, Safe, Accessible -Medications previously tried: n/a  -Recommended to continue current medication  GERD (Goal: manage symptoms) -Controlled -Current treatment  Omeprazole 20 mg daily - Appropriate, Effective, Safe, Accessible -Medications previously tried: n/a  -Recommended to continue current medication  OAB (Goal: manage symptoms) -Controlled - pt reports significant improvement on OTC patch, this is not covered by her insurance but she is willing to buy it -Current treatment  Oxybutynin patch 3.9 mg/24hr 2x weekly - Appropriate, Effective, Safe, Accessible -Medications previously tried: n/a  -Notably, pt has Sjorgens and chronic issues with dry mouth/dry eye; discussed oxybutynin can worsen these issues, however she is tolerating reasonably well right now and agrees benefits > risks -Recommended to continue current medication  Health Maintenance -Vaccine gaps: none -Hx Sjorgens - taking pilocarpine consistently which helps a lot per patient -Discussed insurance coverage gap (donut hole) - this is the first year she is on 2 brand-name meds (Eliquis and Repatha) and she will likely enter donut hole for first time this year, explained what this is and that it will end at end of year and revert back to regular copays January 1; pt voiced understanding and will contact prescribers if she runs into significant cost issues -Current therapy:  Cetirizine 10 mg Epi-Pen PRN Pilocarpine 5 mg TID (dry mouth - Sjorgens) Lubcricating eye drops -Patient is satisfied with current therapy and denies issues -Recommended to continue current medication  Patient Goals/Self-Care Activities Patient will:  - take medications as prescribed as evidenced by patient report and record review focus on medication adherence by pill box collaborate with provider on medication access solutions       Medication  Assistance: None required.  Patient affirms current coverage meets needs.  Compliance/Adherence/Medication fill history: Care Gaps: None  Star-Rating Drugs: None  Medication Access: Within the past 30 days, how often has patient missed a dose of medication? 0 Is a pillbox or other method used to improve adherence? Yes  Factors that may affect medication adherence? no barriers identified Are meds synced by current pharmacy? No  Are meds delivered by current pharmacy? No  Does patient experience delays in picking up medications due to transportation concerns? No   [facility] Reviewed: Is patient disadvantaged to use [facility]?: Yes  Current Rx insurance plan: [employer] Name and location of Current pharmacy:  [facility] [address] [place] - [place] [address] Phone: [phone] Fax: [phone]  [id number] [place] [place]: [phone]: [phone] [facility] services reviewed with patient today?: No  Patient requests to transfer care to [facility]?: No  Reason patient declined to change pharmacies: Disadvantaged due to insurance/mail order   Care Plan and Follow Up Patient Decision:  Patient agrees to Care Plan and Follow-up.  Plan: The patient has been provided with contact information for the care management team and has been advised to call with any health related questions or concerns.   LCharlene Brooke PharmD, BCACP Clinical Pharmacist [phone]

## 2021-12-23 ENCOUNTER — Encounter: Payer: Self-pay | Admitting: Cardiovascular Disease

## 2021-12-23 ENCOUNTER — Ambulatory Visit: Payer: Medicare Other | Attending: Cardiovascular Disease | Admitting: Cardiovascular Disease

## 2021-12-23 VITALS — BP 120/78 | HR 96 | Ht 68.0 in | Wt 248.1 lb

## 2021-12-23 DIAGNOSIS — I4821 Permanent atrial fibrillation: Secondary | ICD-10-CM | POA: Diagnosis not present

## 2021-12-23 DIAGNOSIS — I639 Cerebral infarction, unspecified: Secondary | ICD-10-CM | POA: Diagnosis not present

## 2021-12-23 DIAGNOSIS — G4733 Obstructive sleep apnea (adult) (pediatric): Secondary | ICD-10-CM | POA: Insufficient documentation

## 2021-12-23 DIAGNOSIS — E785 Hyperlipidemia, unspecified: Secondary | ICD-10-CM | POA: Diagnosis not present

## 2021-12-23 MED ORDER — REPATHA SURECLICK 140 MG/ML ~~LOC~~ SOAJ: SUBCUTANEOUS | 3 refills | Status: DC

## 2021-12-23 NOTE — Progress Notes (Signed)
Cardiology Office Note   Date:  12/23/2021   ID:  Lauren [name], DOB 1945-11-26, MRN [id number]  PCP:  Abner Greenspan, MD  Cardiologist:   Kathlyn Sacramento, MD   Chief Complaint  Patient presents with   Other    6 month f/u no complaints today. Meds reviewed verbally with pt.       History of Present Illness: Lauren [name] is a 76 y.o. female who was is here today for follow-up visit regarding permanent atrial fibrillation.   She has past medical history of breast cancer status post bilateral mastectomy, hyperlipidemia with intolerance to statins, borderline diabetes, osteopenia, reactive depression, Sjogren's syndrome, urinary incontinence and vitamin D deficiency. She was hospitalized in May of 2022 with acute onset of slurred speech, facial droop and aphasia.  MRI confirmed the stroke.  EKG showed atrial fibrillation.  She was started on anticoagulation with Eliquis.  Her stroke was thought to be embolic.  Echocardiogram showed normal LV systolic function without significant valvular abnormalities.  Left atrium was mildly dilated. She underwent successful cardioversion in July.  However, she was noted to be back in atrial fibrillation shortly after follow-up.  Given minimal symptoms, I elected to continue rate control.    She fell in March and had a hematoma in the right upper extremity.  She healed fine.  No recurrent falls since then.  She has been doing well with no chest pain, shortness of breath or palpitations.  Past Medical History:  Diagnosis Date   Breast cancer ([place]) 2002   DCIS; BRCA 1 pos HER2 pos, ER2 pos   DDD (degenerative disc disease)    DNR (do not resuscitate) 06/13/2020   GERD (gastroesophageal reflux disease)    History of echocardiogram    a. 06/2020 Echo: EF 55-60%, no rwma, nl RV size/fxn. Mildly dil LA.   HLD (hyperlipidemia)    statin intolerant   Hyperglycemia 10/2007   A1C elvated to 6.2   Obstructive sleep apnea    Osteoarthritis     Osteopenia 2007   ? from xray with nl dexa   Persistent atrial fibrillation ([place])    a. Dx 06/2020 in setting of stroke. CHA2DS2VASc = 5-->Eliquis; b. 08/2020 Successful DCCV; c. 09/2020 Office f/u- pt in rate-controlled Afib.   Reactive depression (situational)    very well controlled, and now takes prozac for sleep   Sjogren's syndrome ([place])    Stroke ([place]) 06/2020   Urine incontinence    Vitamin D deficiency     Past Surgical History:  Procedure Laterality Date   BILATERAL OOPHORECTOMY  205   CARDIOVERSION N/A 08/24/2020   Procedure: CARDIOVERSION;  Surgeon: Wellington Hampshire, MD;  Location: [facility] ORS;  Service: Cardiovascular;  Laterality: N/A;   CATARACT EXTRACTION, BILATERAL     10/2019, 12/2019   COLONOSCOPY  5/07   normal; recheck 3 years   LUMBAR DISC SURGERY  1991   Ruptured disk L5   MASTECTOMY  2001   bilateral   TONSILLECTOMY  1974   TOTAL HIP ARTHROPLASTY  2011   TOTAL KNEE ARTHROPLASTY  2006   bilateral     Current Outpatient Medications  Medication Sig Dispense Refill   acetaminophen (TYLENOL) 500 MG tablet Take 1,000 mg by mouth every 6 (six) hours as needed for moderate pain.     apixaban (ELIQUIS) 5 MG TABS tablet Take 1 tablet by mouth twice daily 180 tablet 1   Carboxymethylcellul-Glycerin (LUBRICATING EYE DROPS OP) Place 1 drop into both eyes 5 (  five) times daily.     cetirizine (ZYRTEC) 10 MG tablet Take 10 mg by mouth at bedtime.     DULoxetine (CYMBALTA) 30 MG capsule Take 1 capsule (30 mg total) by mouth daily. 90 capsule 3   EPINEPHrine 0.3 mg/0.3 mL IJ SOAJ injection Inject 0.3 mLs (0.3 mg total) into the muscle once. (Patient taking differently: Inject 0.3 mg into the muscle as needed for anaphylaxis.) 1 Device 5   ezetimibe (ZETIA) 10 MG tablet Take 1 tablet (10 mg total) by mouth daily. 90 tablet 3   Fish Oil-Cholecalciferol (OMEGA-3 + VITAMIN D3 PO) Take 2 capsules by mouth daily.      fluticasone (FLONASE) 50 MCG/ACT nasal spray Place into both  nostrils daily as needed for allergies or rhinitis.     gabapentin (NEURONTIN) 300 MG capsule Take one by mouth in the am, one mid day and 2 at bedtime 360 capsule 3   hydrOXYzine (ATARAX) 10 MG tablet Take 1 tablet (10 mg total) by mouth 2 (two) times daily as needed. DO NOT TAKE WITH BENADRYL 20 tablet 0   methocarbamol (ROBAXIN) 500 MG tablet Take 1 tablet (500 mg total) by mouth 2 (two) times daily as needed for muscle spasms. Cauttion of sedation 60 tablet 1   metoprolol tartrate (LOPRESSOR) 25 MG tablet Take 1 tablet by mouth twice daily 180 tablet 0   omeprazole (PRILOSEC) 20 MG capsule Take 20 mg by mouth daily.     oxybutynin (OXYTROL) 3.9 MG/24HR Place 1 patch onto the skin 2 (two) times a week. 24 patch 3   pilocarpine (SALAGEN) 5 MG tablet Take 1 tablet (5 mg total) by mouth 3 (three) times daily. 270 tablet 3   silver sulfADIAZINE (SILVADENE) 1 % cream Apply 1 Application topically daily. 50 g 0   vitamin B-12 (CYANOCOBALAMIN) 1000 MCG tablet Take 1,000 mcg by mouth daily.     vitamin C (ASCORBIC ACID) 500 MG tablet Take 500 mg by mouth daily.     zinc gluconate 50 MG tablet Take 50 mg by mouth daily.     Evolocumab (REPATHA SURECLICK) 193 MG/ML SOAJ INJECT ONE DOSE UNDER THE SKIN EVERY 14 DAYS 2 mL 3   No current facility-administered medications for this visit.    Allergies:   Crestor [rosuvastatin], Lipitor [atorvastatin], Statins, and Wasp venom    Social History:  The patient  reports that she has never smoked. She has never used smokeless tobacco. She reports that she does not currently use alcohol. She reports that she does not use drugs.   Family History:  The patient's family history includes Arthritis in an other family member; Brain cancer in her brother; Breast cancer in an other family member; Dementia in her mother; Diabetes in an other family member; Hyperlipidemia in an other family member; Hypertension in an other family member; Other in her daughter; Ovarian  cancer in an other family member; Prostate cancer in an other family member; Psoriasis in her brother; Stroke in her maternal grandmother.    ROS:  Please see the history of present illness.   Otherwise, review of systems are positive for none.   All other systems are reviewed and negative.    PHYSICAL EXAM: VS:  BP 120/78 (BP Location: Right Arm, Patient Position: Sitting, Cuff Size: Large)   Pulse 96   Ht _0  (1.727 m)   Wt 248 lb 2 oz (112.5 kg)   SpO2 98%   BMI 37.73 kg/m  , BMI Body mass index  is 37.73 kg/m. GEN: Well nourished, well developed, in no acute distress  HEENT: normal  Neck: no JVD, carotid bruits, or masses Cardiac: Irregularly irregular; no murmurs, rubs, or gallops,no edema  Respiratory:  clear to auscultation bilaterally, normal work of breathing GI: soft, nontender, nondistended, + BS MS: no deformity or atrophy  Skin: warm and dry, no rash Neuro:  Strength and sensation are intact Psych: euthymic mood, full affect   EKG:  EKG is ordered today. The ekg ordered today demonstrates atrial fibrillation with ventricular rate of 96 bpm.   Recent Labs: 02/12/2021: TSH 0.86 03/05/2021: ALT 16; BUN 9; Creatinine, Ser 0.53; Hemoglobin 14.0; Platelets 265; Potassium 4.2; Sodium 137    Lipid Panel    Component Value Date/Time   CHOL 143 02/12/2021 0956   TRIG 80.0 02/12/2021 0956   HDL 46.40 02/12/2021 0956   CHOLHDL 3 02/12/2021 0956   VLDL 16.0 02/12/2021 0956   LDLCALC 81 02/12/2021 0956   LDLDIRECT 148.9 08/20/2012 0831      Wt Readings from Last 3 Encounters:  12/23/21 248 lb 2 oz (112.5 kg)  11/19/21 250 lb 2 oz (113.5 kg)  11/15/21 252 lb 6 oz (114.5 kg)          06/25/2020    9:56 AM  PAD Screen  Previous PAD dx? No  Previous surgical procedure? No  Pain with walking? No  Feet/toe relief with dangling? No  Painful, non-healing ulcers? No  Extremities discolored? No      ASSESSMENT AND PLAN:  1.  Permanent atrial fibrillation:  Ventricular rate is reasonably controlled with metoprolol 25 mg twice daily and she is overall asymptomatic.   CHA2DS2-VASc score is 5.  Continue anticoagulation with Eliquis 5 mg twice daily.  Most recent labs showed normal renal function.  2.  Hyperlipidemia: Intolerance to statins but she is doing very well with Zetia and Repatha with significant improvement in lipid profile.  She has a physical in the next few months and will get labs then.  3.  Obstructive sleep apnea: She is compliant with CPAP.    Disposition:   FU with me in 6 months  Signed,  Kathlyn Sacramento, MD  12/23/2021 2:30 PM    [place]

## 2021-12-23 NOTE — Patient Instructions (Signed)

## 2022-02-07 ENCOUNTER — Other Ambulatory Visit: Payer: Self-pay | Admitting: Cardiovascular Disease

## 2022-02-08 NOTE — Telephone Encounter (Signed)
Prescription refill request for Eliquis received. Indication:afib Last office visit:11/23 Scr:0.5 Age: 77 Weight:112.5  kg  Prescription refilled

## 2022-02-08 NOTE — Telephone Encounter (Signed)
Refill request

## 2022-02-17 ENCOUNTER — Ambulatory Visit (INDEPENDENT_AMBULATORY_CARE_PROVIDER_SITE_OTHER): Payer: Medicare Other

## 2022-02-17 VITALS — Wt 248.0 lb

## 2022-02-17 DIAGNOSIS — Z0000 Encounter for general adult medical examination without abnormal findings: Secondary | ICD-10-CM | POA: Diagnosis not present

## 2022-02-17 NOTE — Patient Instructions (Signed)
Lauren [name] , Thank you for taking time to come for your Medicare Wellness Visit. I appreciate your ongoing commitment to your health goals. Please review the following plan we discussed and let me know if I can assist you in the future.   Screening recommendations/referrals: Colonoscopy: aged out Mammogram: aged out Bone Density: 02/20/17 Recommended yearly ophthalmology/optometry visit for glaucoma screening and checkup Recommended yearly dental visit for hygiene and checkup  Vaccinations: Influenza vaccine: 11/02/20 Pneumococcal vaccine: 01/08/18 Tdap vaccine: 07/09/15 Shingles vaccine: Zostavax 02/07/05   Shingrix   05/31/16, 08/23/16 Covid-19:02/28/19, 03/21/19, 12/27/19  Advanced directives: no  Conditions/risks identified: none  Next appointment: Follow up in one year for your annual wellness visit 02/20/23 @ 9:45 am by phone   Preventive Care 65 Years and Older, Female Preventive care refers to lifestyle choices and visits with your health care provider that can promote health and wellness. What does preventive care include? A yearly physical exam. This is also called an annual well check. Dental exams once or twice a year. Routine eye exams. Ask your health care provider how often you should have your eyes checked. Personal lifestyle choices, including: Daily care of your teeth and gums. Regular physical activity. Eating a healthy diet. Avoiding tobacco and drug use. Limiting alcohol use. Practicing safe sex. Taking low-dose aspirin every day. Taking vitamin and mineral supplements as recommended by your health care provider. What happens during an annual well check? The services and screenings done by your health care provider during your annual well check will depend on your age, overall health, lifestyle risk factors, and family history of disease. Counseling  Your health care provider may ask you questions about your: Alcohol use. Tobacco use. Drug use. Emotional  well-being. Home and relationship well-being. Sexual activity. Eating habits. History of falls. Memory and ability to understand (cognition). Work and work Statistician. Reproductive health. Screening  You may have the following tests or measurements: Height, weight, and BMI. Blood pressure. Lipid and cholesterol levels. These may be checked every 5 years, or more frequently if you are over 49 years old. Skin check. Lung cancer screening. You may have this screening every year starting at age 49 if you have a 30-pack-year history of smoking and currently smoke or have quit within the past 15 years. Fecal occult blood test (FOBT) of the stool. You may have this test every year starting at age 26. Flexible sigmoidoscopy or colonoscopy. You may have a sigmoidoscopy every 5 years or a colonoscopy every 10 years starting at age 77. Hepatitis C blood test. Hepatitis B blood test. Sexually transmitted disease (STD) testing. Diabetes screening. This is done by checking your blood sugar (glucose) after you have not eaten for a while (fasting). You may have this done every 1-3 years. Bone density scan. This is done to screen for osteoporosis. You may have this done starting at age 30. Mammogram. This may be done every 1-2 years. Talk to your health care provider about how often you should have regular mammograms. Talk with your health care provider about your test results, treatment options, and if necessary, the need for more tests. Vaccines  Your health care provider may recommend certain vaccines, such as: Influenza vaccine. This is recommended every year. Tetanus, diphtheria, and acellular pertussis (Tdap, Td) vaccine. You may need a Td booster every 10 years. Zoster vaccine. You may need this after age 32. Pneumococcal 13-valent conjugate (PCV13) vaccine. One dose is recommended after age 90. Pneumococcal polysaccharide (PPSV23) vaccine. One dose is recommended  after age 32. Talk to your  health care provider about which screenings and vaccines you need and how often you need them. This information is not intended to replace advice given to you by your health care provider. Make sure you discuss any questions you have with your health care provider. Document Released: 02/20/2015 Document Revised: 10/14/2015 Document Reviewed: 11/25/2014 Elsevier Interactive Patient Education  2017 [place] Prevention in the Home Falls can cause injuries. They can happen to people of all ages. There are many things you can do to make your home safe and to help prevent falls. What can I do on the outside of my home? Regularly fix the edges of walkways and driveways and fix any cracks. Remove anything that might make you trip as you walk through a door, such as a raised step or threshold. Trim any bushes or trees on the path to your home. Use bright outdoor lighting. Clear any walking paths of anything that might make someone trip, such as rocks or tools. Regularly check to see if handrails are loose or broken. Make sure that both sides of any steps have handrails. Any raised decks and porches should have guardrails on the edges. Have any leaves, snow, or ice cleared regularly. Use sand or salt on walking paths during winter. Clean up any spills in your garage right away. This includes oil or grease spills. What can I do in the bathroom? Use night lights. Install grab bars by the toilet and in the tub and shower. Do not use towel bars as grab bars. Use non-skid mats or decals in the tub or shower. If you need to sit down in the shower, use a plastic, non-slip stool. Keep the floor dry. Clean up any water that spills on the floor as soon as it happens. Remove soap buildup in the tub or shower regularly. Attach bath mats securely with double-sided non-slip rug tape. Do not have throw rugs and other things on the floor that can make you trip. What can I do in the bedroom? Use night  lights. Make sure that you have a light by your bed that is easy to reach. Do not use any sheets or blankets that are too big for your bed. They should not hang down onto the floor. Have a firm chair that has side arms. You can use this for support while you get dressed. Do not have throw rugs and other things on the floor that can make you trip. What can I do in the kitchen? Clean up any spills right away. Avoid walking on wet floors. Keep items that you use a lot in easy-to-reach places. If you need to reach something above you, use a strong step stool that has a grab bar. Keep electrical cords out of the way. Do not use floor polish or wax that makes floors slippery. If you must use wax, use non-skid floor wax. Do not have throw rugs and other things on the floor that can make you trip. What can I do with my stairs? Do not leave any items on the stairs. Make sure that there are handrails on both sides of the stairs and use them. Fix handrails that are broken or loose. Make sure that handrails are as long as the stairways. Check any carpeting to make sure that it is firmly attached to the stairs. Fix any carpet that is loose or worn. Avoid having throw rugs at the top or bottom of the stairs. If you  do have throw rugs, attach them to the floor with carpet tape. Make sure that you have a light switch at the top of the stairs and the bottom of the stairs. If you do not have them, ask someone to add them for you. What else can I do to help prevent falls? Wear shoes that: Do not have high heels. Have rubber bottoms. Are comfortable and fit you well. Are closed at the toe. Do not wear sandals. If you use a stepladder: Make sure that it is fully opened. Do not climb a closed stepladder. Make sure that both sides of the stepladder are locked into place. Ask someone to hold it for you, if possible. Clearly mark and make sure that you can see: Any grab bars or handrails. First and last  steps. Where the edge of each step is. Use tools that help you move around (mobility aids) if they are needed. These include: Canes. Walkers. Scooters. Crutches. Turn on the lights when you go into a dark area. Replace any light bulbs as soon as they burn out. Set up your furniture so you have a clear path. Avoid moving your furniture around. If any of your floors are uneven, fix them. If there are any pets around you, be aware of where they are. Review your medicines with your doctor. Some medicines can make you feel dizzy. This can increase your chance of falling. Ask your doctor what other things that you can do to help prevent falls. This information is not intended to replace advice given to you by your health care provider. Make sure you discuss any questions you have with your health care provider. Document Released: 11/20/2008 Document Revised: 07/02/2015 Document Reviewed: 02/28/2014 Elsevier Interactive Patient Education  2017 [employer].

## 2022-02-17 NOTE — Progress Notes (Signed)
Virtual Visit via Telephone Note  I connected with  Lauren [name] on 02/17/22 at 10:45 AM EST by telephone and verified that I am speaking with the correct person using two identifiers.  Location: Patient: home Provider: [place] Persons participating in the virtual visit: [place]   I discussed the limitations, risks, security and privacy concerns of performing an evaluation and management service by telephone and the availability of in person appointments. The patient expressed understanding and agreed to proceed.  Interactive audio and video telecommunications were attempted between this nurse and patient, however failed, due to patient having technical difficulties OR patient did not have access to video capability.  We continued and completed visit with audio only.  Some vital signs may be absent or patient reported.   Dionisio David, LPN  Subjective:   Lauren [name] is a 77 y.o. female who presents for Medicare Annual (Subsequent) preventive examination.  Review of Systems     Cardiac Risk Factors include: advanced age (>74mn, >>58women);dyslipidemia     Objective:    Today's Vitals   02/17/22 1046  PainSc: 5    There is no height or weight on file to calculate BMI.     02/17/2022   10:52 AM 10/03/2021    8:21 AM 02/15/2021    2:47 PM 06/13/2020   11:00 PM 02/14/2020    2:48 PM 01/21/2019   12:02 PM 01/11/2018    9:09 AM  Advanced Directives  Does Patient Have a Medical Advance Directive? No No Yes No No Yes Yes  Type of Advance Directive   Living will   [place]  Does patient want to make changes to medical advance directive?   Yes (MAU/Ambulatory/Procedural Areas - Information given)      Copy of [place] Chart?      No - copy requested No - copy requested  Would patient like information on creating a medical advance directive? No - Patient  declined Yes (ED - Information included in AVS)  No - Patient declined Yes (MAU/Ambulatory/Procedural Areas - Information given)      Current Medications (verified) Outpatient Encounter Medications as of 02/17/2022  Medication Sig   acetaminophen (TYLENOL) 500 MG tablet Take 1,000 mg by mouth every 6 (six) hours as needed for moderate pain.   apixaban (ELIQUIS) 5 MG TABS tablet Take 1 tablet by mouth twice daily   Carboxymethylcellul-Glycerin (LUBRICATING EYE DROPS OP) Place 1 drop into both eyes 5 (five) times daily.   cetirizine (ZYRTEC) 10 MG tablet Take 10 mg by mouth at bedtime.   DULoxetine (CYMBALTA) 30 MG capsule Take 1 capsule (30 mg total) by mouth daily.   EPINEPHrine 0.3 mg/0.3 mL IJ SOAJ injection Inject 0.3 mLs (0.3 mg total) into the muscle once. (Patient taking differently: Inject 0.3 mg into the muscle as needed for anaphylaxis.)   Evolocumab (REPATHA SURECLICK) 1275MG/ML SOAJ INJECT ONE DOSE UNDER THE SKIN EVERY 14 DAYS   ezetimibe (ZETIA) 10 MG tablet Take 1 tablet (10 mg total) by mouth daily.   Fish Oil-Cholecalciferol (OMEGA-3 + VITAMIN D3 PO) Take 2 capsules by mouth daily.    fluticasone (FLONASE) 50 MCG/ACT nasal spray Place into both nostrils daily as needed for allergies or rhinitis.   gabapentin (NEURONTIN) 300 MG capsule Take one by mouth in the am, one mid day and 2 at bedtime   methocarbamol (ROBAXIN) 500 MG tablet Take 1 tablet (500  mg total) by mouth 2 (two) times daily as needed for muscle spasms. Cauttion of sedation   metoprolol tartrate (LOPRESSOR) 25 MG tablet Take 1 tablet by mouth twice daily   omeprazole (PRILOSEC) 20 MG capsule Take 20 mg by mouth daily.   oxybutynin (OXYTROL) 3.9 MG/24HR Place 1 patch onto the skin 2 (two) times a week.   pilocarpine (SALAGEN) 5 MG tablet Take 1 tablet (5 mg total) by mouth 3 (three) times daily.   vitamin B-12 (CYANOCOBALAMIN) 1000 MCG tablet Take 1,000 mcg by mouth daily.   vitamin C (ASCORBIC ACID) 500 MG tablet  Take 500 mg by mouth daily.   zinc gluconate 50 MG tablet Take 50 mg by mouth daily.   hydrOXYzine (ATARAX) 10 MG tablet Take 1 tablet (10 mg total) by mouth 2 (two) times daily as needed. DO NOT TAKE WITH BENADRYL (Patient not taking: Reported on 02/17/2022)   silver sulfADIAZINE (SILVADENE) 1 % cream Apply 1 Application topically daily. (Patient not taking: Reported on 02/17/2022)   No facility-administered encounter medications on file as of 02/17/2022.    Allergies (verified) Crestor [rosuvastatin], Lipitor [atorvastatin], Statins, and Wasp venom   History: Past Medical History:  Diagnosis Date   Breast cancer ([place]) 2002   DCIS; BRCA 1 pos HER2 pos, ER2 pos   DDD (degenerative disc disease)    DNR (do not resuscitate) 06/13/2020   GERD (gastroesophageal reflux disease)    History of echocardiogram    a. 06/2020 Echo: EF 55-60%, no rwma, nl RV size/fxn. Mildly dil LA.   HLD (hyperlipidemia)    statin intolerant   Hyperglycemia 10/2007   A1C elvated to 6.2   Obstructive sleep apnea    Osteoarthritis    Osteopenia 2007   ? from xray with nl dexa   Persistent atrial fibrillation ([place])    a. Dx 06/2020 in setting of stroke. CHA2DS2VASc = 5-->Eliquis; b. 08/2020 Successful DCCV; c. 09/2020 Office f/u- pt in rate-controlled Afib.   Reactive depression (situational)    very well controlled, and now takes prozac for sleep   Sjogren's syndrome ([place])    Stroke ([place]) 06/2020   Urine incontinence    Vitamin D deficiency    Past Surgical History:  Procedure Laterality Date   BILATERAL OOPHORECTOMY  205   CARDIOVERSION N/A 08/24/2020   Procedure: CARDIOVERSION;  Surgeon: Wellington Hampshire, MD;  Location: [facility] ORS;  Service: Cardiovascular;  Laterality: N/A;   CATARACT EXTRACTION, BILATERAL     10/2019, 12/2019   COLONOSCOPY  5/07   normal; recheck 3 years   Vernal   Ruptured disk L5   MASTECTOMY  2001   bilateral   TONSILLECTOMY  1974   TOTAL HIP ARTHROPLASTY  2011    TOTAL KNEE ARTHROPLASTY  2006   bilateral   Family History  Problem Relation Age of Onset   Arthritis Other        family hx   Breast cancer Other        1st degree relative <50   Diabetes Other        1st degree relative   Hyperlipidemia Other        family hx   Hypertension Other        family hx   Ovarian cancer Other        family hx   Prostate cancer Other        1st degree relative <50   Dementia Mother    Psoriasis Brother  Other Daughter        BRCA gene   Brain cancer Brother    Stroke Maternal Grandmother    Social History   Socioeconomic History   Marital status: Widowed    Spouse name: Not on file   Number of children: 3   Years of education: Not on file   Highest education level: Not on file  Occupational History   Occupation: Retired  Tobacco Use   Smoking status: Never   Smokeless tobacco: Never  Vaping Use   Vaping Use: Never used  Substance and Sexual Activity   Alcohol use: Not Currently    Alcohol/week: 0.0 standard drinks of alcohol    Comment: rare, none in 2 years   Drug use: No   Sexual activity: Not Currently  Other Topics Concern   Not on file  Social History Narrative   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      Regular exercise-yes   Social Determinants of Health   Financial Resource Strain: [employer]  (02/17/2022)   Overall Financial Resource Strain (CARDIA)    Difficulty of Paying Living Expenses: Not hard at all  Food Insecurity: No Food Insecurity (02/17/2022)   Hunger Vital Sign    Worried About Running Out of Food in the Last Year: Never true    [place] in the Last Year: Never true  Transportation Needs: No Transportation Needs (02/17/2022)   PRAPARE - Hydrologist (Medical): No    Lack of Transportation (Non-Medical): No  Physical Activity: Insufficiently Active (02/17/2022)   Exercise Vital Sign    Days of Exercise per Week: 4  days    Minutes of Exercise per Session: 20 min  Stress: No Stress Concern Present (02/17/2022)   [place]    Feeling of Stress : Only a little  Social Connections: Moderately Isolated (02/17/2022)   Social Connection and Isolation Panel [NHANES]    Frequency of Communication with Friends and Family: More than three times a week    Frequency of Social Gatherings with Friends and Family: Twice a week    Attends Religious Services: More than 4 times per year    Active Member of [employer] or Organizations: No    Attends Archivist Meetings: Never    Marital Status: Widowed    Tobacco Counseling Counseling given: Not Answered   Clinical Intake:  Pre-visit preparation completed: Yes  Pain : 0-10 Pain Score: 5  Pain Type: Chronic pain Pain Location: Back     Nutritional Risks: None Diabetes: No  How often do you need to have someone help you when you read instructions, pamphlets, or other written materials from your doctor or pharmacy?: 1 - Never  Diabetic?no  Interpreter Needed?: No  Information entered by :: Kirke Shaggy, LPN   Activities of Daily Living    02/17/2022   10:53 AM 02/14/2022    6:38 PM  In your present state of health, do you have any difficulty performing the following activities:  Hearing? 0 0  Vision? 0 0  Difficulty concentrating or making decisions? 0 0  Walking or climbing stairs? 0 0  Dressing or bathing? 0 0  Doing errands, shopping? 0 0  Preparing Food and eating ? N N  Using the Toilet? N N  In the past six months, have you  accidently leaked urine? Y Y  Do you have problems with loss of bowel control? N N  Managing your Medications? N N  Managing your Finances? N N  Housekeeping or managing your Housekeeping? N N    Patient Care Team: Tower, Wynelle Fanny, MD as PCP - General Wellington Hampshire, MD as PCP - Cardiology (Cardiology) Sharol Roussel, [place] as Physician  Assistant (Optometry) Lorelee Cover., MD as Referring Physician (Ophthalmology) Laverle Hobby, MD as Consulting Physician (Pulmonary Disease) Marc Morgans, DMD, PA as Referring Physician (Dentistry) Charlton Haws, [facility] as Pharmacist (Pharmacist)  Indicate any recent Medical Services you may have received from other than [facility] providers in the past year (date may be approximate).     Assessment:   This is a routine wellness examination for Lauren [name].  Hearing/Vision screen Hearing Screening - Comments:: No aids Vision Screening - Comments:: Readers- Dr.Bell  Dietary issues and exercise activities discussed: Current Exercise Habits: Home exercise routine, Type of exercise: walking, Time (Minutes): 20, Frequency (Times/Week): 4, Weekly Exercise (Minutes/Week): 80, Intensity: Mild   Goals Addressed             This Visit's Progress    DIET - EAT MORE FRUITS AND VEGETABLES         Depression Screen    02/17/2022   10:50 AM 02/15/2021    2:51 PM 02/14/2020    2:49 PM 01/21/2019   12:03 PM 01/11/2018    9:10 AM 01/04/2017    8:17 AM 12/22/2015   10:04 AM  PHQ 2/9 Scores  PHQ - 2 Score 0 0 0 0 0 0 0  PHQ- 9 Score 0  0 0 0 0     Fall Risk    02/17/2022   10:52 AM 02/14/2022    6:38 PM 02/15/2021    2:50 PM 02/14/2020    2:48 PM 01/21/2019   12:02 PM  [place] in the past year? 1 1 0 0 0  Number falls in past yr: 0 0 0 0 0  Injury with Fall? 1 1 0 0 0  Risk for fall due to : History of fall(s)   No Fall Risks Medication side effect  Follow up Falls prevention discussed;Falls evaluation completed  Falls prevention discussed Falls evaluation completed;Falls prevention discussed Falls evaluation completed;Falls prevention discussed    FALL RISK PREVENTION PERTAINING TO THE HOME:  Any stairs in or around the home? No  If so, are there any without handrails? No  Home free of loose throw rugs in walkways, pet beds, electrical cords, etc? Yes  Adequate lighting  in your home to reduce risk of falls? Yes   ASSISTIVE DEVICES UTILIZED TO PREVENT FALLS:  Life alert? No  Use of a cane, walker or w/c? No  Grab bars in the bathroom? No  Shower chair or bench in shower? Yes  Elevated toilet seat or a handicapped toilet? Yes    Cognitive Function:    02/14/2020    2:52 PM 01/21/2019   12:06 PM 01/11/2018    9:10 AM 01/04/2017    8:17 AM 12/22/2015   10:25 AM  MMSE - Mini Mental State Exam  Orientation to time '5 5 5 5 5  '$ Orientation to Place '5 5 5 5 5  '$ Registration '3 3 3 3 3  '$ Attention/ Calculation 5 5 0 0 0  Recall '3 3 3 3 3  '$ Language- name 2 objects   0 0 0  Language- repeat 1 1 1  1 1  Language- follow 3 step command   '3 3 3  '$ Language- read & follow direction   0 0 0  Write a sentence   0 0 0  Copy design   0 0 0  Total score   '20 20 20        '$ 02/17/2022   10:57 AM  6CIT Screen  What Year? 0 points  What month? 0 points  What time? 0 points  Count back from 20 0 points  Months in reverse 0 points  Repeat phrase 0 points  Total Score 0 points    Immunizations Immunization History  Administered Date(s) Administered   HPV Bivalent 11/10/2016   Influenza Split 11/26/2010   Influenza Whole 12/06/2009   Influenza, High Dose Seasonal PF 12/31/2019, 11/02/2020   Influenza, Seasonal, Injecte, Preservative Fre 10/23/2015   Influenza,inj,Quad PF,6+ Mos 11/26/2013, 11/10/2014, 11/10/2016, 11/22/2017, 10/30/2018   Influenza-Unspecified 11/26/2013, 11/10/2014, 11/10/2016, 11/22/2017, 10/30/2018   PFIZER(Purple Top)SARS-COV-2 Vaccination 02/28/2019, 03/21/2019, 12/27/2019   Pneumococcal Conjugate-13 12/24/2014, 05/31/2016   Pneumococcal Polysaccharide-23 01/19/2010, 01/08/2018   Td 10/13/2006   Zoster Recombinat (Shingrix) 05/31/2016, 08/23/2016   Zoster, Live 02/07/2005    TDAP status: Up to date  Flu Vaccine status: Up to date  Pneumococcal vaccine status: Up to date  Covid-19 vaccine status: Completed vaccines  Qualifies for  Shingles Vaccine? Yes   Zostavax completed Yes   Shingrix Completed?: Yes  Screening Tests Health Maintenance  Topic Date Due   DTaP/Tdap/Td (2 - Tdap) 10/12/2016   COVID-19 Vaccine (4 - 2023-24 season) 10/08/2021   INFLUENZA VACCINE  05/08/2022 (Originally 09/07/2021)   PAP SMEAR-Modifier  01/05/2024 (Originally 02/14/2012)   Medicare Annual Wellness (AWV)  02/18/2023   Pneumonia Vaccine 26+ Years old  Completed   DEXA SCAN  Completed   Hepatitis C Screening  Completed   Zoster Vaccines- Shingrix  Completed   HPV VACCINES  Aged Out   Fecal DNA (Cologuard)  Discontinued    Health Maintenance  Health Maintenance Due  Topic Date Due   DTaP/Tdap/Td (2 - Tdap) 10/12/2016   COVID-19 Vaccine (4 - 2023-24 season) 10/08/2021    Colorectal cancer screening: No longer required.   Mammogram status: No longer required due to age.  Bone Density status: Completed 02/20/17. Results reflect: Bone density results: NORMAL. Repeat every 5 years.- declined referral  Lung Cancer Screening: (Low Dose CT Chest recommended if Age 63-80 years, 30 pack-year currently smoking OR have quit w/in 15years.) does not qualify.   Additional Screening:  Hepatitis C Screening: does qualify; Completed 02/13/18  Vision Screening: Recommended annual ophthalmology exams for early detection of glaucoma and other disorders of the eye. Is the patient up to date with their annual eye exam?  Yes  Who is the provider or what is the name of the office in which the patient attends annual eye exams? Dr.Bell If pt is not established with a provider, would they like to be referred to a provider to establish care? No .   Dental Screening: Recommended annual dental exams for proper oral hygiene  Community Resource Referral / Chronic Care Management: CRR required this visit?  No   CCM required this visit?  No      Plan:     I have personally reviewed and noted the following in the patient's chart:   Medical and social  history Use of alcohol, tobacco or illicit drugs  Current medications and supplements including opioid prescriptions. Patient is not currently taking opioid prescriptions. Functional ability  and status Nutritional status Physical activity Advanced directives List of other physicians Hospitalizations, surgeries, and ER visits in previous 12 months Vitals Screenings to include cognitive, depression, and falls Referrals and appointments  In addition, I have reviewed and discussed with patient certain preventive protocols, quality metrics, and best practice recommendations. A written personalized care plan for preventive services as well as general preventive health recommendations were provided to patient.     Dionisio David, LPN   04/20/9700   Nurse Notes: none

## 2022-02-21 DIAGNOSIS — M791 Myalgia, unspecified site: Secondary | ICD-10-CM | POA: Diagnosis not present

## 2022-02-21 DIAGNOSIS — M3509 Sicca syndrome with other organ involvement: Secondary | ICD-10-CM | POA: Diagnosis not present

## 2022-02-21 DIAGNOSIS — M159 Polyosteoarthritis, unspecified: Secondary | ICD-10-CM | POA: Diagnosis not present

## 2022-03-04 DIAGNOSIS — Z7189 Other specified counseling: Secondary | ICD-10-CM | POA: Diagnosis not present

## 2022-03-17 ENCOUNTER — Other Ambulatory Visit (INDEPENDENT_AMBULATORY_CARE_PROVIDER_SITE_OTHER): Payer: Medicare Other

## 2022-03-17 ENCOUNTER — Telehealth (INDEPENDENT_AMBULATORY_CARE_PROVIDER_SITE_OTHER): Payer: Medicare Other | Admitting: Family Medicine

## 2022-03-17 DIAGNOSIS — E7800 Pure hypercholesterolemia, unspecified: Secondary | ICD-10-CM

## 2022-03-17 DIAGNOSIS — R030 Elevated blood-pressure reading, without diagnosis of hypertension: Secondary | ICD-10-CM

## 2022-03-17 DIAGNOSIS — R7303 Prediabetes: Secondary | ICD-10-CM

## 2022-03-17 DIAGNOSIS — E538 Deficiency of other specified B group vitamins: Secondary | ICD-10-CM | POA: Diagnosis not present

## 2022-03-17 DIAGNOSIS — E559 Vitamin D deficiency, unspecified: Secondary | ICD-10-CM | POA: Diagnosis not present

## 2022-03-17 DIAGNOSIS — Z853 Personal history of malignant neoplasm of breast: Secondary | ICD-10-CM

## 2022-03-17 LAB — COMPREHENSIVE METABOLIC PANEL
ALT: 9 U/L (ref 0–35)
AST: 13 U/L (ref 0–37)
Albumin: 3.9 g/dL (ref 3.5–5.2)
Alkaline Phosphatase: 88 U/L (ref 39–117)
BUN: 13 mg/dL (ref 6–23)
CO2: 31 mEq/L (ref 19–32)
Calcium: 9.1 mg/dL (ref 8.4–10.5)
Chloride: 100 mEq/L (ref 96–112)
Creatinine, Ser: 0.64 mg/dL (ref 0.40–1.20)
GFR: 85.54 mL/min (ref >60.00)
Glucose, Bld: 118 mg/dL — ABNORMAL HIGH (ref 70–99)
Potassium: 4.5 mEq/L (ref 3.5–5.1)
Sodium: 138 mEq/L (ref 135–145)
Total Bilirubin: 0.5 mg/dL (ref 0.2–1.2)
Total Protein: 6.6 g/dL (ref 6.0–8.3)

## 2022-03-17 LAB — LIPID PANEL
Cholesterol: 136 mg/dL (ref 0–200)
HDL: 42.8 mg/dL (ref >39.00)
LDL Cholesterol: 70 mg/dL (ref 0–99)
NonHDL: 92.85
Total CHOL/HDL Ratio: 3
Triglycerides: 114 mg/dL (ref 0.0–149.0)
VLDL: 22.8 mg/dL (ref 0.0–40.0)

## 2022-03-17 LAB — VITAMIN D 25 HYDROXY (VIT D DEFICIENCY, FRACTURES): VITD: 40.74 ng/mL (ref 30.00–100.00)

## 2022-03-17 LAB — CBC WITH DIFFERENTIAL/PLATELET
Basophils Absolute: 0.1 10*3/uL (ref 0.0–0.1)
Basophils Relative: 1 % (ref 0.0–3.0)
Eosinophils Absolute: 0.2 10*3/uL (ref 0.0–0.7)
Eosinophils Relative: 2.2 % (ref 0.0–5.0)
HCT: 42.3 % (ref 36.0–46.0)
Hemoglobin: 14 g/dL (ref 12.0–15.0)
Lymphocytes Relative: 17.8 % (ref 12.0–46.0)
Lymphs Abs: 1.6 10*3/uL (ref 0.7–4.0)
MCHC: 33.1 g/dL (ref 30.0–36.0)
MCV: 84.9 fl (ref 78.0–100.0)
Monocytes Absolute: 0.7 10*3/uL (ref 0.1–1.0)
Monocytes Relative: 7.7 % (ref 3.0–12.0)
Neutro Abs: 6.2 10*3/uL (ref 1.4–7.7)
Neutrophils Relative %: 71.3 % (ref 43.0–77.0)
Platelets: 237 10*3/uL (ref 150.0–400.0)
RBC: 4.98 Mil/uL (ref 3.87–5.11)
RDW: 15.6 % — ABNORMAL HIGH (ref 11.5–15.5)
WBC: 8.7 10*3/uL (ref 4.0–10.5)

## 2022-03-17 LAB — HEMOGLOBIN A1C: Hgb A1c MFr Bld: 6.3 % (ref 4.6–6.5)

## 2022-03-17 LAB — VITAMIN B12: Vitamin B-12: 437 pg/mL (ref 211–911)

## 2022-03-17 NOTE — Telephone Encounter (Signed)
-----   Message from Lauren [name] sent at 03/17/2022  8:53 AM EST ----- Regarding: lab orders for now Patient is scheduled for CPX labs, please order future labs, Thanks , [name]

## 2022-03-20 ENCOUNTER — Telehealth: Payer: Self-pay | Admitting: Family Medicine

## 2022-03-20 DIAGNOSIS — E7800 Pure hypercholesterolemia, unspecified: Secondary | ICD-10-CM

## 2022-03-20 DIAGNOSIS — E559 Vitamin D deficiency, unspecified: Secondary | ICD-10-CM

## 2022-03-20 DIAGNOSIS — R7303 Prediabetes: Secondary | ICD-10-CM

## 2022-03-20 DIAGNOSIS — Z8673 Personal history of transient ischemic attack (TIA), and cerebral infarction without residual deficits: Secondary | ICD-10-CM

## 2022-03-20 DIAGNOSIS — E538 Deficiency of other specified B group vitamins: Secondary | ICD-10-CM

## 2022-03-20 DIAGNOSIS — R030 Elevated blood-pressure reading, without diagnosis of hypertension: Secondary | ICD-10-CM

## 2022-03-20 DIAGNOSIS — I4821 Permanent atrial fibrillation: Secondary | ICD-10-CM

## 2022-03-20 NOTE — Telephone Encounter (Signed)
-----   Message from Velna Hatchet, RT sent at 03/07/2022  9:06 AM EST ----- Regarding: Mon 2/12 lab Patient is scheduled for cpx, please order future labs.  Thanks, Anda Kraft

## 2022-03-21 ENCOUNTER — Other Ambulatory Visit: Payer: Medicare Other

## 2022-03-28 ENCOUNTER — Ambulatory Visit (INDEPENDENT_AMBULATORY_CARE_PROVIDER_SITE_OTHER): Payer: Medicare Other | Admitting: Family Medicine

## 2022-03-28 ENCOUNTER — Encounter: Payer: Self-pay | Admitting: Family Medicine

## 2022-03-28 VITALS — BP 114/68 | HR 61 | Temp 98.0°F | Ht 67.0 in | Wt 239.5 lb

## 2022-03-28 DIAGNOSIS — I4821 Permanent atrial fibrillation: Secondary | ICD-10-CM

## 2022-03-28 DIAGNOSIS — R7303 Prediabetes: Secondary | ICD-10-CM

## 2022-03-28 DIAGNOSIS — G4733 Obstructive sleep apnea (adult) (pediatric): Secondary | ICD-10-CM | POA: Diagnosis not present

## 2022-03-28 DIAGNOSIS — M7918 Myalgia, other site: Secondary | ICD-10-CM

## 2022-03-28 DIAGNOSIS — E538 Deficiency of other specified B group vitamins: Secondary | ICD-10-CM | POA: Diagnosis not present

## 2022-03-28 DIAGNOSIS — E2839 Other primary ovarian failure: Secondary | ICD-10-CM | POA: Diagnosis not present

## 2022-03-28 DIAGNOSIS — E669 Obesity, unspecified: Secondary | ICD-10-CM

## 2022-03-28 DIAGNOSIS — K219 Gastro-esophageal reflux disease without esophagitis: Secondary | ICD-10-CM | POA: Diagnosis not present

## 2022-03-28 DIAGNOSIS — E559 Vitamin D deficiency, unspecified: Secondary | ICD-10-CM | POA: Diagnosis not present

## 2022-03-28 DIAGNOSIS — E7800 Pure hypercholesterolemia, unspecified: Secondary | ICD-10-CM

## 2022-03-28 DIAGNOSIS — Z0000 Encounter for general adult medical examination without abnormal findings: Secondary | ICD-10-CM

## 2022-03-28 DIAGNOSIS — M3500 Sicca syndrome, unspecified: Secondary | ICD-10-CM | POA: Diagnosis not present

## 2022-03-28 MED ORDER — METHOCARBAMOL 500 MG PO TABS: 500.0000 mg | ORAL_TABLET | Freq: Two times a day (BID) | ORAL | 1 refills | Status: DC | PRN

## 2022-03-28 MED ORDER — DULOXETINE HCL 30 MG PO CPEP: 30.0000 mg | ORAL_CAPSULE | Freq: Every day | ORAL | 3 refills | Status: DC

## 2022-03-28 MED ORDER — OXYBUTYNIN 3.9 MG/24HR TD PTTW: 1.0000 | MEDICATED_PATCH | TRANSDERMAL | 3 refills | Status: DC

## 2022-03-28 MED ORDER — GABAPENTIN 300 MG PO CAPS: ORAL_CAPSULE | ORAL | 3 refills | Status: DC

## 2022-03-28 MED ORDER — EZETIMIBE 10 MG PO TABS: 10.0000 mg | ORAL_TABLET | Freq: Every day | ORAL | 3 refills | Status: DC

## 2022-03-28 MED ORDER — PILOCARPINE HCL 5 MG PO TABS: 5.0000 mg | ORAL_TABLET | Freq: Three times a day (TID) | ORAL | 3 refills | Status: DC

## 2022-03-28 NOTE — Assessment & Plan Note (Signed)
No clinical changes Sees cardiology  Continues metoprolol and eliquis  Nl bp and pulse

## 2022-03-28 NOTE — Assessment & Plan Note (Signed)
Discussed how this problem influences overall health and the risks it imposes  Reviewed plan for weight loss with lower calorie diet (via better food choices and also portion control or program like weight watchers) and exercise building up to or more than 30 minutes 5 days per week including some aerobic activity   Commended wt loss so far with diet/exercise (as px in her dementia prevention trial) Feels good about this

## 2022-03-28 NOTE — Assessment & Plan Note (Signed)
Dexa ordered for OP screening  Takes vit D Enc exercise as tolerated

## 2022-03-28 NOTE — Assessment & Plan Note (Signed)
Struggling more lately  Conitnues cymbalta 30 mg daily  Disc options for low impact exercise  Enc good self care

## 2022-03-28 NOTE — Assessment & Plan Note (Signed)
Doing better with Repatha/ also taking zetia  Disc goals for lipids and reasons to control them Rev last labs with pt  Diet is better also -commended LDL of 70- at goal

## 2022-03-28 NOTE — Assessment & Plan Note (Signed)
Vitamin D level is therapeutic with current supplementation Disc importance of this to bone and overall health Level of 40.7

## 2022-03-28 NOTE — Patient Instructions (Addendum)
Keep up the great work with healthy diet/exercise and weight loss     Call [facility] to schedule your bone density test  Please call the location of your choice from the menu below to schedule your Mammogram and/or Bone Density appointment.    [facility]                      Phone:  [phone], [place] [address]                                                             Services: Traditional and 3D Mammogram, [place] Bone Density                 Phone: [phone] [address], [place] [address]    Service: Bone Density ONLY   *this site does NOT perform mammograms  [facility]                        Phone:  [phone] [address], [place] [address]                                            Services:  3D Mammogram and [place] at [facility]   Phone:  [phone]   [place], [place] [address]                                            Services: 3D Mammogram and [place]  [place] at [facility])  Phone:  [phone]   [address]  [place],  [address]                                              Services:  3D Mammogram and Bone Density    Check out integrative therapies practice in [place]

## 2022-03-28 NOTE — Assessment & Plan Note (Signed)
Off ppi for 2 wk Lab Results  Component Value Date   [id number] 03/17/2022   Continues oral supplementation

## 2022-03-28 NOTE — Assessment & Plan Note (Signed)
Struggling with cpap-is uncomfortable with myofascial pain recently

## 2022-03-28 NOTE — Assessment & Plan Note (Signed)
Off omeprazole for 2 weeks and doing well  Wants to keep on list for prn use   B12 level is normal

## 2022-03-28 NOTE — Progress Notes (Signed)
Subjective:    Patient ID: Lauren [name], female    DOB: September 06, 1945, 77 y.o.   MRN: [id number]  HPI Pt presents for annual follow up of chronic medical problems   Wt Readings from Last 3 Encounters:  03/28/22 239 lb 8 oz (108.6 kg)  02/17/22 248 lb (112.5 kg)  12/23/21 248 lb 2 oz (112.5 kg)   37.51 kg/m  Doing well   Had a rough year overall  A lot of pain , worse at night (myofacial pain)  Dr Milas Kocher told her to take the methocarbamol more  Tylenol pm did not help Doing better   Is in a dementia prevention group with neurology She is enjoying it  Eating better and losing weight (at peak was 252) Off sugar and refined foods  Has a coach to work on exercise and strength training  Using a glucose wearable meter = doing well with that  A lot of activities/ for brain   Immunization History  Administered Date(s) Administered   HPV Bivalent 11/10/2016   Influenza Split 11/26/2010   Influenza Whole 12/06/2009   Influenza, High Dose Seasonal PF 12/31/2019, 11/02/2020   Influenza, Seasonal, Injecte, Preservative Fre 10/23/2015   Influenza,inj,Quad PF,6+ Mos 11/26/2013, 11/10/2014, 11/10/2016, 11/22/2017, 10/30/2018   Influenza-Unspecified 11/26/2013, 11/10/2014, 11/10/2016, 11/22/2017, 10/30/2018   PFIZER(Purple Top)SARS-COV-2 Vaccination 02/28/2019, 03/21/2019, 12/27/2019   Pneumococcal Conjugate-13 12/24/2014, 05/31/2016   Pneumococcal Polysaccharide-23 01/19/2010, 01/08/2018   Td 10/13/2006   Zoster Recombinat (Shingrix) 05/31/2016, 08/23/2016   Zoster, Live 02/07/2005   Health Maintenance Due  Topic Date Due   DTaP/Tdap/Td (2 - Tdap) 10/12/2016    Due for tetanus- declines   Mammogram - none in light of mastectomy/ personal history of breast cancer Self breast exam  Dexa 02/2017- nl bmd  Falls: - one, 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- slipped , tore skin on R arm Fractures:none  Supplements - taking D   (ca from diet)  D level is 40.7  Exercise is  better  Has a trainer  Walking  Exercise did make her hurt more   Colon cancer screening - cologuard 04/2020 negative   BP Readings from Last 3 Encounters:  03/28/22 114/68  12/23/21 120/78  11/19/21 118/82   Pulse Readings from Last 3 Encounters:  03/28/22 61  12/23/21 96  11/19/21 100   H/o a fib Takes metoprolol and eliquis Under card care  OSA- uses cpap Is uncomfortable with her myofacial pain   GERD Unable to come off omeprazole 20 mg daily until recently  Off of it for 2 weeks  Eating differently now   B12 def Lab Results  Component Value Date   VITAMINB12 437 03/17/2022     Hyperlipidemia Lab Results  Component Value Date   CHOL 136 03/17/2022   CHOL 143 02/12/2021   CHOL 111 10/21/2020   Lab Results  Component Value Date   HDL 42.80 03/17/2022   HDL 46.40 02/12/2021   HDL 46 10/21/2020   Lab Results  Component Value Date   LDLCALC 70 03/17/2022   LDLCALC 81 02/12/2021   LDLCALC 51 10/21/2020   Lab Results  Component Value Date   TRIG 114.0 03/17/2022   TRIG 80.0 02/12/2021   TRIG 71 10/21/2020   Lab Results  Component Value Date   CHOLHDL 3 03/17/2022   CHOLHDL 3 02/12/2021   CHOLHDL 2.4 10/21/2020   Lab Results  Component Value Date   LDLDIRECT 148.9 08/20/2012   LDLDIRECT 184.7 02/11/2009   Repatha and  zetia   Prediabetes Lab Results  Component Value Date   HGBA1C 6.3 03/17/2022   Up from 6.1   Lab Results  Component Value Date   WBC 8.7 03/17/2022   HGB 14.0 03/17/2022   HCT 42.3 03/17/2022   MCV 84.9 03/17/2022   PLT 237.0 03/17/2022   Lab Results  Component Value Date   CREATININE 0.64 03/17/2022   BUN 13 03/17/2022   NA 138 03/17/2022   K 4.5 03/17/2022   CL 100 03/17/2022   CO2 31 03/17/2022   Lab Results  Component Value Date   ALT 9 03/17/2022   AST 13 03/17/2022   ALKPHOS 88 03/17/2022   BILITOT 0.5 03/17/2022    Patient Active Problem List   Diagnosis Date Noted   IBS (irritable bowel  syndrome) 05/24/2021   Atrial fibrillation ([place]) 09/29/2020   Myofascial pain 09/29/2020   History of CVA (cerebrovascular accident) 06/13/2020   DNR (do not resuscitate) 06/13/2020   Dermatochalasis of both upper eyelids 07/15/2019   Ptosis of both upper eyelids 07/15/2019   Carpal tunnel syndrome 01/25/2019   Age-related nuclear cataract, bilateral 06/19/2018   Vitamin B12 deficiency 03/27/2018   Sjogren's disease ([place]) 01/15/2018   Osteoarthritis 01/15/2018   Obesity (BMI 30-39.9) 01/15/2018   H/O herpes zoster 11/22/2017   Estrogen deficiency 01/09/2017   Routine general medical examination at a health care facility 01/01/2017   OSA (obstructive sleep apnea) 09/18/2016   Bee sting allergy 06/10/2014   Encounter for Medicare annual wellness exam 12/03/2013   History of shingles 11/27/2013   Hx of breast cancer 08/22/2011   Gynecological examination 02/14/2011   Bilateral dry eyes 01/20/2011   Dry eye syndrome of bilateral lacrimal glands 01/20/2011   Vitamin D deficiency 04/22/2008   Hyperlipidemia 04/22/2008   GERD 04/22/2008   Prediabetes 04/22/2008   Past Medical History:  Diagnosis Date   Breast cancer ([place]) 2002   DCIS; BRCA 1 pos HER2 pos, ER2 pos   DDD (degenerative disc disease)    DNR (do not resuscitate) 06/13/2020   GERD (gastroesophageal reflux disease)    History of echocardiogram    a. 06/2020 Echo: EF 55-60%, no rwma, nl RV size/fxn. Mildly dil LA.   HLD (hyperlipidemia)    statin intolerant   Hyperglycemia 10/2007   A1C elvated to 6.2   Obstructive sleep apnea    Osteoarthritis    Osteopenia 2007   ? from xray with nl dexa   Persistent atrial fibrillation ([place])    a. Dx 06/2020 in setting of stroke. CHA2DS2VASc = 5-->Eliquis; b. 08/2020 Successful DCCV; c. 09/2020 Office f/u- pt in rate-controlled Afib.   Reactive depression (situational)    very well controlled, and now takes prozac for sleep   Sjogren's syndrome ([place])    Stroke ([place]) 06/2020    Urine incontinence    Vitamin D deficiency    Past Surgical History:  Procedure Laterality Date   BILATERAL OOPHORECTOMY  205   CARDIOVERSION N/A 08/24/2020   Procedure: CARDIOVERSION;  Surgeon: Wellington Hampshire, MD;  Location: [facility] ORS;  Service: Cardiovascular;  Laterality: N/A;   CATARACT EXTRACTION, BILATERAL     10/2019, 12/2019   COLONOSCOPY  5/07   normal; recheck 3 years   [place]   Ruptured disk L5   MASTECTOMY  2001   bilateral   TONSILLECTOMY  1974   TOTAL HIP ARTHROPLASTY  2011   TOTAL KNEE ARTHROPLASTY  2006   bilateral   Social History  Tobacco Use   Smoking status: Never   Smokeless tobacco: Never  Vaping Use   Vaping Use: Never used  Substance Use Topics   Alcohol use: Not Currently    Alcohol/week: 0.0 standard drinks of alcohol    Comment: rare, none in 2 years   Drug use: No   Family History  Problem Relation Age of Onset   Arthritis Other        family hx   Breast cancer Other        1st degree relative <50   Diabetes Other        1st degree relative   Hyperlipidemia Other        family hx   Hypertension Other        family hx   Ovarian cancer Other        family hx   Prostate cancer Other        1st degree relative <50   Dementia Mother    Psoriasis Brother    Other Daughter        BRCA gene   Brain cancer Brother    Stroke Maternal Grandmother    Allergies  Allergen Reactions   Crestor [Rosuvastatin]     myalgias   Lipitor [Atorvastatin]     myalgias   Statins     joint pain   Wasp Venom Swelling   Current Outpatient Medications on File Prior to Visit  Medication Sig Dispense Refill   acetaminophen (TYLENOL) 500 MG tablet Take 1,000 mg by mouth every 6 (six) hours as needed for moderate pain.     apixaban (ELIQUIS) 5 MG TABS tablet Take 1 tablet by mouth twice daily 180 tablet 1   Carboxymethylcellul-Glycerin (LUBRICATING EYE DROPS OP) Place 1 drop into both eyes 5 (five) times daily.     cetirizine  (ZYRTEC) 10 MG tablet Take 10 mg by mouth at bedtime.     EPINEPHrine 0.3 mg/0.3 mL IJ SOAJ injection Inject 0.3 mLs (0.3 mg total) into the muscle once. (Patient taking differently: Inject 0.3 mg into the muscle as needed for anaphylaxis.) 1 Device 5   Evolocumab (REPATHA SURECLICK) XX123456 MG/ML SOAJ INJECT ONE DOSE UNDER THE SKIN EVERY 14 DAYS 2 mL 3   Fish Oil-Cholecalciferol (OMEGA-3 + VITAMIN D3 PO) Take 2 capsules by mouth daily.      fluticasone (FLONASE) 50 MCG/ACT nasal spray Place into both nostrils daily as needed for allergies or rhinitis.     metoprolol tartrate (LOPRESSOR) 25 MG tablet Take 1 tablet by mouth twice daily 180 tablet 0   omeprazole (PRILOSEC) 20 MG capsule Take 20 mg by mouth daily as needed.     silver sulfADIAZINE (SILVADENE) 1 % cream Apply 1 Application topically daily. 50 g 0   vitamin B-12 (CYANOCOBALAMIN) 1000 MCG tablet Take 1,000 mcg by mouth daily.     vitamin C (ASCORBIC ACID) 500 MG tablet Take 500 mg by mouth daily.     zinc gluconate 50 MG tablet Take 50 mg by mouth daily.     No current facility-administered medications on file prior to visit.    Review of Systems  Constitutional:  Positive for fatigue. Negative for activity change, appetite change, fever and unexpected weight change.  HENT:  Negative for congestion, ear pain, rhinorrhea, sinus pressure and sore throat.   Eyes:  Negative for pain, redness and visual disturbance.  Respiratory:  Negative for cough, shortness of breath and wheezing.   Cardiovascular:  Negative for chest pain and  palpitations.  Gastrointestinal:  Negative for abdominal pain, blood in stool, constipation and diarrhea.  Endocrine: Negative for polydipsia and polyuria.  Genitourinary:  Negative for dysuria, frequency and urgency.  Musculoskeletal:  Positive for arthralgias, back pain and myalgias. Negative for joint swelling.  Skin:  Negative for pallor and rash.  Allergic/Immunologic: Negative for environmental allergies.   Neurological:  Negative for dizziness, syncope and headaches.  Hematological:  Negative for adenopathy. Does not bruise/bleed easily.  Psychiatric/Behavioral:  Negative for decreased concentration and dysphoric mood. The patient is not nervous/anxious.        Objective:   Physical Exam Constitutional:      General: She is not in acute distress.    Appearance: Normal appearance. She is well-developed. She is obese. She is not ill-appearing or diaphoretic.  HENT:     Head: Normocephalic and atraumatic.     Right Ear: Tympanic membrane, ear canal and external ear normal.     Left Ear: Tympanic membrane, ear canal and external ear normal.     Nose: Nose normal. No congestion.     Mouth/Throat:     Mouth: Mucous membranes are moist.     Pharynx: Oropharynx is clear. No posterior oropharyngeal erythema.  Eyes:     General: No scleral icterus.    Extraocular Movements: Extraocular movements intact.     Conjunctiva/sclera: Conjunctivae normal.     Pupils: Pupils are equal, round, and reactive to light.  Neck:     Thyroid: No thyromegaly.     Vascular: No carotid bruit or JVD.  Cardiovascular:     Rate and Rhythm: Normal rate and regular rhythm.     Pulses: Normal pulses.     Heart sounds: Normal heart sounds.     No gallop.  Pulmonary:     Effort: Pulmonary effort is normal. No respiratory distress.     Breath sounds: Normal breath sounds. No wheezing.     Comments: Good air exch Chest:     Chest wall: No tenderness.  Abdominal:     General: Bowel sounds are normal. There is no distension or abdominal bruit.     Palpations: Abdomen is soft. There is no mass.     Tenderness: There is no abdominal tenderness.     Hernia: No hernia is present.  Genitourinary:    Comments: Breast exam: No mass, nodules, thickening, tenderness, bulging, retraction, inflamation, nipple discharge or skin changes noted.  No axillary or clavicular LA.     Musculoskeletal:        General: No tenderness.  Normal range of motion.     Cervical back: Normal range of motion and neck supple. No rigidity. No muscular tenderness.     Right lower leg: No edema.     Left lower leg: No edema.     Comments: Mild kyphosis    Lymphadenopathy:     Cervical: No cervical adenopathy.  Skin:    General: Skin is warm and dry.     Coloration: Skin is not pale.     Findings: No erythema or rash.  Neurological:     Mental Status: She is alert. Mental status is at baseline.     Cranial Nerves: No cranial nerve deficit.     Motor: No abnormal muscle tone.     Coordination: Coordination normal.     Gait: Gait normal.     Deep Tendon Reflexes: Reflexes are normal and symmetric.  Psychiatric:        Mood and Affect: Mood normal.  Cognition and Memory: Cognition and memory normal.           Assessment & Plan:   Problem List Items Addressed This Visit       Cardiovascular and Mediastinum   Atrial fibrillation (HCC)    No clinical changes Sees cardiology  Continues metoprolol and eliquis  Nl bp and pulse      Relevant Medications   ezetimibe (ZETIA) 10 MG tablet     Respiratory   OSA (obstructive sleep apnea)    Struggling with cpap-is uncomfortable with myofascial pain recently          Digestive   GERD    Off omeprazole for 2 weeks and doing well  Wants to keep on list for prn use   B12 level is normal         Other   Estrogen deficiency    Dexa ordered for OP screening  Takes vit D Enc exercise as tolerated      Relevant Orders   DG Bone Density   Hyperlipidemia    Doing better with Repatha/ also taking zetia  Disc goals for lipids and reasons to control them Rev last labs with pt  Diet is better also -commended LDL of 70- at goal      Relevant Medications   ezetimibe (ZETIA) 10 MG tablet   Myofascial pain    Struggling more lately  Conitnues cymbalta 30 mg daily  Disc options for low impact exercise  Enc good self care       Obesity (BMI 30-39.9)     Discussed how this problem influences overall health and the risks it imposes  Reviewed plan for weight loss with lower calorie diet (via better food choices and also portion control or program like weight watchers) and exercise building up to or more than 30 minutes 5 days per week including some aerobic activity   Commended wt loss so far with diet/exercise (as px in her dementia prevention trial) Feels good about this       Prediabetes    Lab Results  Component Value Date   HGBA1C 6.3 03/17/2022  Is eating less processed food and less added sugar Commended  Wt loss noted disc imp of low glycemic diet and wt loss to prevent DM2       Routine general medical examination at a health care facility - Primary   Sjogren's disease ([place])    Refilled pilocarpine No clinical changes       Vitamin B12 deficiency    Off ppi for 2 wk Lab Results  Component Value Date   [id number] 03/17/2022  Continues oral supplementation         Vitamin D deficiency    Vitamin D level is therapeutic with current supplementation Disc importance of this to bone and overall health Level of 40.7

## 2022-03-28 NOTE — Assessment & Plan Note (Signed)
Refilled pilocarpine No clinical changes

## 2022-03-28 NOTE — Assessment & Plan Note (Signed)
Lab Results  Component Value Date   HGBA1C 6.3 03/17/2022   Is eating less processed food and less added sugar Commended  Wt loss noted disc imp of low glycemic diet and wt loss to prevent DM2

## 2022-04-01 DIAGNOSIS — Z7189 Other specified counseling: Secondary | ICD-10-CM | POA: Diagnosis not present

## 2022-04-26 ENCOUNTER — Telehealth: Payer: Self-pay

## 2022-04-26 NOTE — Telephone Encounter (Signed)
Lm for patient to request that she bring SD card to 04/27/22 appt.

## 2022-04-27 ENCOUNTER — Encounter: Payer: Self-pay | Admitting: Internal Medicine

## 2022-04-27 ENCOUNTER — Ambulatory Visit (INDEPENDENT_AMBULATORY_CARE_PROVIDER_SITE_OTHER): Payer: Medicare Other | Admitting: Internal Medicine

## 2022-04-27 VITALS — BP 132/82 | HR 77 | Temp 98.0°F | Ht 68.0 in | Wt 236.6 lb

## 2022-04-27 DIAGNOSIS — G4733 Obstructive sleep apnea (adult) (pediatric): Secondary | ICD-10-CM

## 2022-04-27 NOTE — Progress Notes (Signed)
@Patient  ID: Lauren [name], female    DOB: 01-22-1946, 77 y.o.   MRN: [id number]  SYNOPSIS 77 year old female followed for severe obstructive sleep apnea on CPAP  TEST/EVENTS :  PSG 10/19/2016 showed severe obstructive sleep apnea, AHI 76/hr with SPO2 low 84.5% PREVIOUS OV Patient says she wears her CPAP every single night never misses any nights.  She typically gets in about 8 hours.  Patient says she feels rested with no significant daytime sleepiness.  Feels that she benefits from CPAP.  CPAP download shows 100% compliance with daily average usage at 8.5 hours.  Patient is on auto CPAP 13 to 16 cm H2O.  Daily average pressure at 15.3 cm H2O.  AHI is 1.4 Uses full face mask.      CC Follow up Assessment for OSA      HPI: Diagnosis of severe OSA with AHI of 76 Patient has shown excellent compliance   No exacerbation at this time No evidence of heart failure at this time No evidence or signs of infection at this time No respiratory distress No fevers, chills, nausea, vomiting, diarrhea No evidence of lower extremity edema No evidence hemoptysis   Allergies  Allergen Reactions   Crestor [Rosuvastatin]     myalgias   Lipitor [Atorvastatin]     myalgias   Statins     joint pain   Wasp Venom Swelling    Immunization History  Administered Date(s) Administered   HPV Bivalent 11/10/2016   Influenza Split 11/26/2010   Influenza Whole 12/06/2009   Influenza, High Dose Seasonal PF 12/31/2019, 11/02/2020   Influenza, Seasonal, Injecte, Preservative Fre 10/23/2015   Influenza,inj,Quad PF,6+ Mos 11/26/2013, 11/10/2014, 11/10/2016, 11/22/2017, 10/30/2018   Influenza-Unspecified 11/26/2013, 11/10/2014, 11/10/2016, 11/22/2017, 10/30/2018   PFIZER(Purple Top)SARS-COV-2 Vaccination 02/28/2019, 03/21/2019, 12/27/2019   Pneumococcal Conjugate-13 12/24/2014, 05/31/2016   Pneumococcal Polysaccharide-23 01/19/2010, 01/08/2018   Td 10/13/2006   Zoster Recombinat (Shingrix)  05/31/2016, 08/23/2016   Zoster, Live 02/07/2005    Past Medical History:  Diagnosis Date   Breast cancer ([place]) 2002   DCIS; BRCA 1 pos HER2 pos, ER2 pos   DDD (degenerative disc disease)    DNR (do not resuscitate) 06/13/2020   GERD (gastroesophageal reflux disease)    History of echocardiogram    a. 06/2020 Echo: EF 55-60%, no rwma, nl RV size/fxn. Mildly dil LA.   HLD (hyperlipidemia)    statin intolerant   Hyperglycemia 10/2007   A1C elvated to 6.2   Obstructive sleep apnea    Osteoarthritis    Osteopenia 2007   ? from xray with nl dexa   Persistent atrial fibrillation ([place])    a. Dx 06/2020 in setting of stroke. CHA2DS2VASc = 5-->Eliquis; b. 08/2020 Successful DCCV; c. 09/2020 Office f/u- pt in rate-controlled Afib.   Reactive depression (situational)    very well controlled, and now takes prozac for sleep   Sjogren's syndrome ([place])    Stroke ([place]) 06/2020   Urine incontinence    Vitamin D deficiency     Tobacco History: Social History   Tobacco Use  Smoking Status Never  Smokeless Tobacco Never   Counseling given: Not Answered   Outpatient Medications Prior to Visit  Medication Sig Dispense Refill   acetaminophen (TYLENOL) 500 MG tablet Take 1,000 mg by mouth every 6 (six) hours as needed for moderate pain.     apixaban (ELIQUIS) 5 MG TABS tablet Take 1 tablet by mouth twice daily 180 tablet 1   Carboxymethylcellul-Glycerin (LUBRICATING EYE DROPS OP) Place 1 drop into  both eyes 5 (five) times daily.     cetirizine (ZYRTEC) 10 MG tablet Take 10 mg by mouth at bedtime.     DULoxetine (CYMBALTA) 30 MG capsule Take 1 capsule (30 mg total) by mouth daily. 90 capsule 3   EPINEPHrine 0.3 mg/0.3 mL IJ SOAJ injection Inject 0.3 mLs (0.3 mg total) into the muscle once. (Patient taking differently: Inject 0.3 mg into the muscle as needed for anaphylaxis.) 1 Device 5   Evolocumab (REPATHA SURECLICK) [id number] MG/ML SOAJ INJECT ONE DOSE UNDER THE SKIN EVERY 14 DAYS 2 mL 3   ezetimibe  (ZETIA) 10 MG tablet Take 1 tablet (10 mg total) by mouth daily. 90 tablet 3   Fish Oil-Cholecalciferol (OMEGA-3 + VITAMIN D3 PO) Take 2 capsules by mouth daily.      fluticasone (FLONASE) 50 MCG/ACT nasal spray Place into both nostrils daily as needed for allergies or rhinitis.     gabapentin (NEURONTIN) 300 MG capsule Take one by mouth in the am, one mid day and 2 at bedtime 360 capsule 3   methocarbamol (ROBAXIN) 500 MG tablet Take 1 tablet (500 mg total) by mouth 2 (two) times daily as needed for muscle spasms. Cauttion of sedation 90 tablet 1   metoprolol tartrate (LOPRESSOR) 25 MG tablet Take 1 tablet by mouth twice daily 180 tablet 0   omeprazole (PRILOSEC) 20 MG capsule Take 20 mg by mouth daily as needed.     oxybutynin (OXYTROL) 3.9 MG/24HR Place 1 patch onto the skin 2 (two) times a week. 24 patch 3   pilocarpine (SALAGEN) 5 MG tablet Take 1 tablet (5 mg total) by mouth 3 (three) times daily. 270 tablet 3   silver sulfADIAZINE (SILVADENE) 1 % cream Apply 1 Application topically daily. 50 g 0   vitamin B-12 (CYANOCOBALAMIN) 1000 MCG tablet Take 1,000 mcg by mouth daily.     vitamin C (ASCORBIC ACID) 500 MG tablet Take 500 mg by mouth daily.     zinc gluconate 50 MG tablet Take 50 mg by mouth daily.     No facility-administered medications prior to visit.   There were no vitals taken for this visit.      Review of Systems: Gen:  Denies  fever, sweats, chills weight loss  HEENT: Denies blurred vision, double vision, ear pain, eye pain, hearing loss, nose bleeds, sore throat Cardiac:  No dizziness, chest pain or heaviness, chest tightness,edema, No JVD Resp:   No cough, -sputum production, -shortness of breath,-wheezing, -hemoptysis,  Other:  All other systems negative   Physical Examination:   General Appearance: No distress  EYES PERRLA, EOM intact.   NECK Supple, No JVD Pulmonary: normal breath sounds, No wheezing.  CardiovascularNormal S1,S2.  No m/r/g.   Abdomen:  Benign, Soft, non-tender. Neurology UE/LE 5/5 strength, no focal deficits Ext pulses intact, cap refill intact ALL OTHER ROS ARE NEGATIVE   CBC    Component Value Date/Time   WBC 8.7 03/17/2022 1041   RBC 4.98 03/17/2022 1041   HGB 14.0 03/17/2022 1041   HGB 14.0 03/05/2021 1020   HGB 14.7 10/31/2012 0821   HCT 42.3 03/17/2022 1041   HCT 43.3 03/05/2021 1020   HCT 43.8 10/31/2012 0821   PLT 237.0 03/17/2022 1041   PLT 265 03/05/2021 1020   MCV 84.9 03/17/2022 1041   MCV 86 03/05/2021 1020   MCV 87.1 10/31/2012 0821   MCH 27.9 03/05/2021 1020   MCH 27.7 06/15/2020 0722   MCHC 33.1 03/17/2022 1041   RDW  15.6 (H) 03/17/2022 1041   RDW 14.0 03/05/2021 1020   RDW 14.2 10/31/2012 0821   LYMPHSABS 1.6 03/17/2022 1041   LYMPHSABS 1.3 10/31/2012 0821   MONOABS 0.7 03/17/2022 1041   MONOABS 0.6 10/31/2012 0821   EOSABS 0.2 03/17/2022 1041   EOSABS 0.2 10/31/2012 0821   EOSABS 0.2 11/09/2007 1054   BASOSABS 0.1 03/17/2022 1041   BASOSABS 0.0 10/31/2012 0821      Latest Ref Rng & Units 03/17/2022   10:41 AM 03/05/2021   10:20 AM 02/12/2021    9:56 AM  BMP  Glucose 70 - 99 mg/dL 118  105  112   BUN 6 - 23 mg/dL 13  9  14    Creatinine 0.40 - 1.20 mg/dL 0.64  0.53  0.63   BUN/Creat Ratio 12 - 28  17    Sodium 135 - 145 mEq/L 138  137  138   Potassium 3.5 - 5.1 mEq/L 4.5  4.2  4.2   Chloride 96 - 112 mEq/L 100  101  101   CO2 19 - 32 mEq/L 31  22  30    Calcium 8.4 - 10.5 mg/dL 9.1  7.5  9.2        Assessment & Plan:  77 year old pleasant white female seen today for follow-up assessment for severe obstructive sleep apnea with AHI of 76  At this time I have reviewed her compliance report in detail Patient uses and benefits from therapy Patient has excellent compliance report 100% for days and greater than 4 hours AHI reduced to 1.0   Obesity -recommend significant weight loss -recommend changing diet  Deconditioned state -Recommend increased daily activity and  exercise   Follow Up cardiology AFib on Oral AC  Follow up Neurology S/p CVA  MEDICATION ADJUSTMENTS/LABS AND TESTS ORDERED:    CURRENT MEDICATIONS REVIEWED AT LENGTH WITH PATIENT TODAY   Patient  satisfied with Plan of action and management. All questions answered  Follow up  1 year  Total Time Spent  24 mins   Caryssa Elzey Patricia Pesa, M.D.  [facility]   Medical Director [place] Director [facility] Cardio-Pulmonary Department

## 2022-04-27 NOTE — Telephone Encounter (Signed)
Spoke to patient and requested that she bring SD card to today's visit. Nothing further needed.

## 2022-04-27 NOTE — Patient Instructions (Addendum)
Continue CPAP as prescribed  EXCELLENT JOB A+  GREAT JOB ON LOSING WEIGHT!!   WE ARE SO PROUD OF YOU!

## 2022-04-29 DIAGNOSIS — Z7189 Other specified counseling: Secondary | ICD-10-CM | POA: Diagnosis not present

## 2022-05-02 ENCOUNTER — Ambulatory Visit
Admission: RE | Admit: 2022-05-02 | Discharge: 2022-05-02 | Disposition: A | Discharge status: Home / Self Care | Payer: Medicare Other | Source: Ambulatory Visit | Attending: Family Medicine | Admitting: Family Medicine

## 2022-05-02 DIAGNOSIS — Z780 Asymptomatic menopausal state: Secondary | ICD-10-CM | POA: Diagnosis not present

## 2022-05-02 DIAGNOSIS — E2839 Other primary ovarian failure: Secondary | ICD-10-CM | POA: Insufficient documentation

## 2022-05-09 ENCOUNTER — Other Ambulatory Visit: Payer: Self-pay | Admitting: Cardiovascular Disease

## 2022-05-17 ENCOUNTER — Telehealth: Payer: Self-pay | Admitting: Internal Medicine

## 2022-05-17 DIAGNOSIS — G4733 Obstructive sleep apnea (adult) (pediatric): Secondary | ICD-10-CM

## 2022-05-17 NOTE — Telephone Encounter (Signed)
Order placed to [facility] for new cpap machine.  Patient is aware and voiced her understanding. Nothing further needed.

## 2022-05-17 NOTE — Telephone Encounter (Signed)
Patient states needs order for new CPAP machine. Patient would like to use [facility]. Fax number is [phone]. Patient phone number is [phone].

## 2022-05-17 NOTE — Telephone Encounter (Signed)
Called and spoke to patient.  She is requesting order for new cpap machine.   Dr. Belia Heman, please advise. Thanks

## 2022-05-26 DIAGNOSIS — H04123 Dry eye syndrome of bilateral lacrimal glands: Secondary | ICD-10-CM | POA: Diagnosis not present

## 2022-05-26 DIAGNOSIS — Z961 Presence of intraocular lens: Secondary | ICD-10-CM | POA: Diagnosis not present

## 2022-05-26 DIAGNOSIS — D3131 Benign neoplasm of right choroid: Secondary | ICD-10-CM | POA: Diagnosis not present

## 2022-05-31 DIAGNOSIS — D3130 Benign neoplasm of unspecified choroid: Secondary | ICD-10-CM | POA: Diagnosis not present

## 2022-06-07 ENCOUNTER — Other Ambulatory Visit: Payer: Self-pay | Admitting: Cardiovascular Disease

## 2022-06-10 ENCOUNTER — Telehealth: Payer: Self-pay

## 2022-06-10 NOTE — Progress Notes (Unsigned)
Care Management & Coordination Services Pharmacy Team  Reason for Encounter: General Adherence Update   Contacted patient for general health update and medication adherence call.  {US HC Outreach:28874}  What concerns do you have about your medications?  The patient {denies/reports:25180} side effects with their medications.   How often do you forget or accidentally miss a dose? {missed doses:25554}  Do you use a pillbox? {yes/no:20286}  Are you having any problems getting your medications from your pharmacy? {yes/no:20286}  Has the cost of your medications been a concern? {yes/no:20286}  Since last visit with PharmD, the following interventions have been made.  Stop: ATARAX 10 MG tablet   The patient has not had an ED visit since last contact.   The patient {denies/reports:25180} problems with their health.   Patient {denies/reports:25180} concerns or questions for Al Corpus, PharmD at this time.   Chart Updates:  Recent office visits:  03/28/22 Roxy Manns, MD Annual Exam Stop: ATARAX 10 MG tablet  02/17/22 AWV  Recent consult visits:  05/26/22 Marvis Repress, MD Nevus of choroid o right eye Opthalmology exam performed 04/29/22 Cristopher Peru, MD (Neurology) Counseling on health promotion  04/27/22 Erin Fulling, MD (Pulmonology) OSA No med changes F/U 1 year 04/01/22 Cristopher Peru, MD (Neurology) Counseling on health promotion  03/04/22 Cristopher Peru, MD (Neurology) [place]; Set up FitBit 02/21/22 Mayur Patel, MD (Rheumatology) Sjogren's syndrome No med changes F/U 6 months 12/23/21 Lorine Bears, MD (Cardiology) Permanent Atrial Fibrillation Ordered: EKG No med changes F/U 6 months  Hospital visits:  None in previous 6 months  Medications: Outpatient Encounter Medications as of 06/10/2022  Medication Sig   acetaminophen (TYLENOL) 500 MG tablet Take 1,000 mg by mouth every 6 (six) hours as needed for moderate pain.   apixaban (ELIQUIS) 5 MG TABS tablet  Take 1 tablet by mouth twice daily   Carboxymethylcellul-Glycerin (LUBRICATING EYE DROPS OP) Place 1 drop into both eyes 5 (five) times daily.   cetirizine (ZYRTEC) 10 MG tablet Take 10 mg by mouth at bedtime.   DULoxetine (CYMBALTA) 30 MG capsule Take 1 capsule (30 mg total) by mouth daily.   EPINEPHrine 0.3 mg/0.3 mL IJ SOAJ injection Inject 0.3 mLs (0.3 mg total) into the muscle once. (Patient taking differently: Inject 0.3 mg into the muscle as needed for anaphylaxis.)   Evolocumab (REPATHA SURECLICK) 140 MG/ML SOAJ INJECT 1 DOSE  SUBCUTANEOUSLY ONCE EVERY 14 DAYS   ezetimibe (ZETIA) 10 MG tablet Take 1 tablet (10 mg total) by mouth daily.   Fish Oil-Cholecalciferol (OMEGA-3 + VITAMIN D3 PO) Take 2 capsules by mouth daily.    fluticasone (FLONASE) 50 MCG/ACT nasal spray Place into both nostrils daily as needed for allergies or rhinitis.   gabapentin (NEURONTIN) 300 MG capsule Take one by mouth in the am, one mid day and 2 at bedtime   methocarbamol (ROBAXIN) 500 MG tablet Take 1 tablet (500 mg total) by mouth 2 (two) times daily as needed for muscle spasms. Cauttion of sedation   metoprolol tartrate (LOPRESSOR) 25 MG tablet Take 1 tablet by mouth twice daily   omeprazole (PRILOSEC) 20 MG capsule Take 20 mg by mouth daily as needed.   oxybutynin (OXYTROL) 3.9 MG/24HR Place 1 patch onto the skin 2 (two) times a week.   pilocarpine (SALAGEN) 5 MG tablet Take 1 tablet (5 mg total) by mouth 3 (three) times daily.   silver sulfADIAZINE (SILVADENE) 1 % cream Apply 1 Application topically daily.   vitamin B-12 (CYANOCOBALAMIN) 1000 MCG tablet Take  1,000 mcg by mouth daily.   vitamin C (ASCORBIC ACID) 500 MG tablet Take 500 mg by mouth daily.   zinc gluconate 50 MG tablet Take 50 mg by mouth daily.   No facility-administered encounter medications on file as of 06/10/2022.    Recent vitals BP Readings from Last 3 Encounters:  04/27/22 132/82  03/28/22 114/68  12/23/21 120/78   Pulse Readings  from Last 3 Encounters:  04/27/22 77  03/28/22 61  12/23/21 96   Wt Readings from Last 3 Encounters:  04/27/22 236 lb 9.6 oz (107.3 kg)  03/28/22 239 lb 8 oz (108.6 kg)  02/17/22 248 lb (112.5 kg)   BMI Readings from Last 3 Encounters:  04/27/22 35.97 kg/m  03/28/22 37.51 kg/m  02/17/22 37.71 kg/m    Recent lab results    Component Value Date/Time   NA 138 03/17/2022 1041   NA 137 03/05/2021 1020   NA 140 10/31/2012 0821   K 4.5 03/17/2022 1041   K 4.3 10/31/2012 0821   CL 100 03/17/2022 1041   CL 103 10/26/2011 0957   CO2 31 03/17/2022 1041   CO2 22 10/31/2012 0821   GLUCOSE 118 (H) 03/17/2022 1041   GLUCOSE 89 10/31/2012 0821   GLUCOSE 103 (H) 10/26/2011 0957   BUN 13 03/17/2022 1041   BUN 9 03/05/2021 1020   BUN 12.1 10/31/2012 0821   CREATININE 0.64 03/17/2022 1041   CREATININE 0.7 10/31/2012 0821   CALCIUM 9.1 03/17/2022 1041   CALCIUM 9.6 10/31/2012 0821    Lab Results  Component Value Date   CREATININE 0.64 03/17/2022   GFR 85.54 03/17/2022   EGFR 96 03/05/2021   GFRNONAA >60 06/15/2020   GFRAA 99 09/11/2019   Lab Results  Component Value Date/Time   HGBA1C 6.3 03/17/2022 10:41 AM   HGBA1C 6.5 (A) 08/23/2021 08:00 AM   HGBA1C 6.1 (A) 05/24/2021 08:09 AM   HGBA1C 6.7 (H) 02/12/2021 09:56 AM    Lab Results  Component Value Date   CHOL 136 03/17/2022   HDL 42.80 03/17/2022   LDLCALC 70 03/17/2022   LDLDIRECT 148.9 08/20/2012   TRIG 114.0 03/17/2022   CHOLHDL 3 03/17/2022    Care Gaps: Annual wellness visit in last year? Yes 02/17/2022  Star Rating Drugs:  Medication:  Last Fill: Day Supply None noted  Al Corpus, PharmD notified  Claudina Lick, [place] Clinical Pharmacy Assistant [phone] '

## 2022-06-14 DIAGNOSIS — G5603 Carpal tunnel syndrome, bilateral upper limbs: Secondary | ICD-10-CM | POA: Diagnosis not present

## 2022-06-16 NOTE — Telephone Encounter (Signed)
"  Patient states she is doing no sugar, no processed food or gluten. Patient has lost quite a bit of weight and no pain. Patient feels like a new person. Sleeping 8-9 hours a night. Patient states life is wonderful and she has no more pain. Dr. Clelia Croft has asked her to do a speech at [facility] on cutting the above from her life and how it has changed her. Patient sounded great. Patient wanted to make sure Dr. Milinda Antis knows how she is doing and how grateful she is"

## 2022-06-16 NOTE — Telephone Encounter (Signed)
That is fantastic thanks for the heads up!

## 2022-06-23 ENCOUNTER — Ambulatory Visit: Payer: Medicare Other | Attending: Cardiovascular Disease | Admitting: Cardiovascular Disease

## 2022-06-23 ENCOUNTER — Encounter: Payer: Self-pay | Admitting: Cardiovascular Disease

## 2022-06-23 VITALS — BP 110/70 | HR 83 | Ht 68.0 in | Wt 230.4 lb

## 2022-06-23 DIAGNOSIS — I4821 Permanent atrial fibrillation: Secondary | ICD-10-CM | POA: Diagnosis not present

## 2022-06-23 DIAGNOSIS — E785 Hyperlipidemia, unspecified: Secondary | ICD-10-CM | POA: Diagnosis not present

## 2022-06-23 NOTE — Patient Instructions (Signed)
Medication Instructions:  No changes *If you need a refill on your cardiac medications before your next appointment, please call your pharmacy*   Lab Work: None ordered If you have labs (blood work) drawn today and your tests are completely normal, you will receive your results only by: MyChart Message (if you have MyChart) OR A paper copy in the mail If you have any lab test that is abnormal or we need to change your treatment, we will call you to review the results.   Testing/Procedures: None orderred   Follow-Up: At [facility], you and your health needs are our priority.  As part of our continuing mission to provide you with exceptional heart care, we have created designated Provider Care Teams.  These Care Teams include your primary Cardiologist (physician) and Advanced Practice Providers (APPs -  Physician Assistants and Nurse Practitioners) who all work together to provide you with the care you need, when you need it.  We recommend signing up for the patient portal called "MyChart".  Sign up information is provided on this After Visit Summary.  MyChart is used to connect with patients for Virtual Visits (Telemedicine).  Patients are able to view lab/test results, encounter notes, upcoming appointments, etc.  Non-urgent messages can be sent to your provider as well.   To learn more about what you can do with MyChart, go to [email or web address].    Your next appointment:   9 month(s)  Provider:   You may see Lorine Bears, MD or one of the following Advanced Practice Providers on your designated Care Team:   Nicolasa Ducking, NP Eula Listen, PA-C Cadence Fransico Michael, PA-C Charlsie Quest, NP

## 2022-06-23 NOTE — Progress Notes (Signed)
Cardiology Office Note   Date:  06/23/2022   ID:  Lauren [name], DOB 14-Feb-1945, MRN [id number]  PCP:  [name], MD  Cardiologist:   Lorine Bears, MD   Chief Complaint  Patient presents with   Follow-up    6 month f/u no complaints today. Meds reviewed verbally with pt.       History of Present Illness: Lauren [name] is a 77 y.o. female who was is here today for follow-up visit regarding permanent atrial fibrillation.   She has past medical history of breast cancer status post bilateral mastectomy, hyperlipidemia with intolerance to statins, borderline diabetes, osteopenia, reactive depression, Sjogren's syndrome, urinary incontinence and vitamin D deficiency. She was hospitalized in May of 2022 with acute onset of slurred speech, facial droop and aphasia.  MRI confirmed the stroke.  EKG showed atrial fibrillation.  She was started on anticoagulation with Eliquis.  Her stroke was thought to be embolic.  Echocardiogram showed normal LV systolic function without significant valvular abnormalities.  Left atrium was mildly dilated. She is being treated with rate control and anticoagulation.  She is doing well with no chest pain, shortness of breath or palpitations.  No side effects with anticoagulation. She is attending a dementia prevention program with Dr. Sherryll Burger and has been doing extremely well with healthy lifestyle changes.  Past Medical History:  Diagnosis Date   Breast cancer (HCC) 2002   DCIS; BRCA 1 pos HER2 pos, ER2 pos   DDD (degenerative disc disease)    DNR (do not resuscitate) 06/13/2020   GERD (gastroesophageal reflux disease)    History of echocardiogram    a. 06/2020 Echo: EF 55-60%, no rwma, nl RV size/fxn. Mildly dil LA.   HLD (hyperlipidemia)    statin intolerant   Hyperglycemia 10/2007   A1C elvated to 6.2   Obstructive sleep apnea    Osteoarthritis    Osteopenia 2007   ? from xray with nl dexa   Persistent atrial fibrillation (HCC)    a. Dx  06/2020 in setting of stroke. CHA2DS2VASc = 5-->Eliquis; b. 08/2020 Successful DCCV; c. 09/2020 Office f/u- pt in rate-controlled Afib.   Reactive depression (situational)    very well controlled, and now takes prozac for sleep   Sjogren's syndrome (HCC)    Stroke (HCC) 06/2020   Urine incontinence    Vitamin D deficiency     Past Surgical History:  Procedure Laterality Date   BILATERAL OOPHORECTOMY  205   CARDIOVERSION N/A 08/24/2020   Procedure: CARDIOVERSION;  Surgeon: Iran Ouch, MD;  Location: [facility] ORS;  Service: Cardiovascular;  Laterality: N/A;   CATARACT EXTRACTION, BILATERAL     10/2019, 12/2019   COLONOSCOPY  5/07   normal; recheck 3 years   LUMBAR DISC SURGERY  1991   Ruptured disk L5   MASTECTOMY  2001   bilateral   TONSILLECTOMY  1974   TOTAL HIP ARTHROPLASTY  2011   TOTAL KNEE ARTHROPLASTY  2006   bilateral     Current Outpatient Medications  Medication Sig Dispense Refill   acetaminophen (TYLENOL) 500 MG tablet Take 1,000 mg by mouth every 6 (six) hours as needed for moderate pain.     apixaban (ELIQUIS) 5 MG TABS tablet Take 1 tablet by mouth twice daily 180 tablet 1   Carboxymethylcellul-Glycerin (LUBRICATING EYE DROPS OP) Place 1 drop into both eyes 5 (five) times daily.     cetirizine (ZYRTEC) 10 MG tablet Take 10 mg by mouth at bedtime.  DULoxetine (CYMBALTA) 30 MG capsule Take 1 capsule (30 mg total) by mouth daily. 90 capsule 3   EPINEPHrine 0.3 mg/0.3 mL IJ SOAJ injection Inject 0.3 mLs (0.3 mg total) into the muscle once. (Patient taking differently: Inject 0.3 mg into the muscle as needed for anaphylaxis.) 1 Device 5   Evolocumab (REPATHA SURECLICK) 140 MG/ML SOAJ INJECT 1 DOSE  SUBCUTANEOUSLY ONCE EVERY 14 DAYS 2 mL 0   ezetimibe (ZETIA) 10 MG tablet Take 1 tablet (10 mg total) by mouth daily. 90 tablet 3   Fish Oil-Cholecalciferol (OMEGA-3 + VITAMIN D3 PO) Take 2 capsules by mouth daily.      fluticasone (FLONASE) 50 MCG/ACT nasal spray Place  into both nostrils daily as needed for allergies or rhinitis.     gabapentin (NEURONTIN) 300 MG capsule Take one by mouth in the am, one mid day and 2 at bedtime 360 capsule 3   methocarbamol (ROBAXIN) 500 MG tablet Take 1 tablet (500 mg total) by mouth 2 (two) times daily as needed for muscle spasms. Cauttion of sedation 90 tablet 1   metoprolol tartrate (LOPRESSOR) 25 MG tablet Take 1 tablet by mouth twice daily 180 tablet 0   oxybutynin (OXYTROL) 3.9 MG/24HR Place 1 patch onto the skin 2 (two) times a week. 24 patch 3   pilocarpine (SALAGEN) 5 MG tablet Take 1 tablet (5 mg total) by mouth 3 (three) times daily. 270 tablet 3   silver sulfADIAZINE (SILVADENE) 1 % cream Apply 1 Application topically daily. 50 g 0   vitamin B-12 (CYANOCOBALAMIN) 1000 MCG tablet Take 1,000 mcg by mouth daily.     vitamin C (ASCORBIC ACID) 500 MG tablet Take 500 mg by mouth daily.     No current facility-administered medications for this visit.    Allergies:   Crestor [rosuvastatin], Lipitor [atorvastatin], Statins, and Wasp venom    Social History:  The patient  reports that she has never smoked. She has never used smokeless tobacco. She reports that she does not currently use alcohol. She reports that she does not use drugs.   Family History:  The patient's family history includes Arthritis in an other family member; Brain cancer in her brother; Breast cancer in an other family member; Dementia in her mother; Diabetes in an other family member; Hyperlipidemia in an other family member; Hypertension in an other family member; Other in her daughter; Ovarian cancer in an other family member; Prostate cancer in an other family member; Psoriasis in her brother; Stroke in her maternal grandmother.    ROS:  Please see the history of present illness.   Otherwise, review of systems are positive for none.   All other systems are reviewed and negative.    PHYSICAL EXAM: VS:  BP 110/70 (BP Location: Right Arm, Patient  Position: Sitting, Cuff Size: Large)   Pulse 83   Ht 5\' 8"  (1.727 m)   Wt 230 lb 6 oz (104.5 kg)   SpO2 98%   BMI 35.03 kg/m  , BMI Body mass index is 35.03 kg/m. GEN: Well nourished, well developed, in no acute distress  HEENT: normal  Neck: no JVD, carotid bruits, or masses Cardiac: Irregularly irregular; no murmurs, rubs, or gallops,no edema  Respiratory:  clear to auscultation bilaterally, normal work of breathing GI: soft, nontender, nondistended, + BS MS: no deformity or atrophy  Skin: warm and dry, no rash Neuro:  Strength and sensation are intact Psych: euthymic mood, full affect   EKG:  EKG is ordered today. The  ekg ordered today demonstrates atrial fibrillation with ventricular rate of 83 beats per minute.   Recent Labs: 03/17/2022: ALT 9; BUN 13; Creatinine, Ser 0.64; Hemoglobin 14.0; Platelets 237.0; Potassium 4.5; Sodium 138    Lipid Panel    Component Value Date/Time   CHOL 136 03/17/2022 1041   TRIG 114.0 03/17/2022 1041   HDL 42.80 03/17/2022 1041   CHOLHDL 3 03/17/2022 1041   VLDL 22.8 03/17/2022 1041   LDLCALC 70 03/17/2022 1041   LDLDIRECT 148.9 08/20/2012 0831      Wt Readings from Last 3 Encounters:  06/23/22 230 lb 6 oz (104.5 kg)  04/27/22 236 lb 9.6 oz (107.3 kg)  03/28/22 239 lb 8 oz (108.6 kg)          06/25/2020    9:56 AM  PAD Screen  Previous PAD dx? No  Previous surgical procedure? No  Pain with walking? No  Feet/toe relief with dangling? No  Painful, non-healing ulcers? No  Extremities discolored? No      ASSESSMENT AND PLAN:  1.  Permanent atrial fibrillation: Ventricular rate is well controlled with metoprolol 25 mg twice daily and she is overall asymptomatic.   CHA2DS2-VASc score is 5.  Continue anticoagulation with Eliquis 5 mg twice daily.  Most recent labs showed normal renal function.  2.  Hyperlipidemia: Intolerance to statins but she is doing very well with Zetia and Repatha with significant improvement in lipid  profile.  Most recent lipid profile showed an LDL of 70.  3.  Obstructive sleep apnea: She is compliant with CPAP.    Disposition:   FU with me in 6 months  Signed,  Lorine Bears, MD  06/23/2022 1:31 PM    [facility]

## 2022-07-01 DIAGNOSIS — M654 Radial styloid tenosynovitis [de Quervain]: Secondary | ICD-10-CM | POA: Diagnosis not present

## 2022-07-01 DIAGNOSIS — M79632 Pain in left forearm: Secondary | ICD-10-CM | POA: Diagnosis not present

## 2022-07-05 ENCOUNTER — Other Ambulatory Visit: Payer: Self-pay | Admitting: Cardiovascular Disease

## 2022-07-28 DIAGNOSIS — D2262 Melanocytic nevi of left upper limb, including shoulder: Secondary | ICD-10-CM | POA: Diagnosis not present

## 2022-07-28 DIAGNOSIS — D2261 Melanocytic nevi of right upper limb, including shoulder: Secondary | ICD-10-CM | POA: Diagnosis not present

## 2022-07-28 DIAGNOSIS — D225 Melanocytic nevi of trunk: Secondary | ICD-10-CM | POA: Diagnosis not present

## 2022-07-28 DIAGNOSIS — D2272 Melanocytic nevi of left lower limb, including hip: Secondary | ICD-10-CM | POA: Diagnosis not present

## 2022-07-28 DIAGNOSIS — D2271 Melanocytic nevi of right lower limb, including hip: Secondary | ICD-10-CM | POA: Diagnosis not present

## 2022-07-28 DIAGNOSIS — L565 Disseminated superficial actinic porokeratosis (DSAP): Secondary | ICD-10-CM | POA: Diagnosis not present

## 2022-07-28 DIAGNOSIS — L821 Other seborrheic keratosis: Secondary | ICD-10-CM | POA: Diagnosis not present

## 2022-08-04 ENCOUNTER — Other Ambulatory Visit: Payer: Self-pay | Admitting: Cardiovascular Disease

## 2022-08-08 ENCOUNTER — Other Ambulatory Visit: Payer: Self-pay | Admitting: Cardiovascular Disease

## 2022-08-09 NOTE — Telephone Encounter (Signed)
Prescription refill request for Eliquis received. Indication:afib Last office visit:5/24 Scr:0.64  2/24 Age: 77 Weight:104.5  kg  Prescription refilled

## 2022-08-09 NOTE — Telephone Encounter (Signed)
Refill request for Eliquis

## 2022-08-22 DIAGNOSIS — M3509 Sicca syndrome with other organ involvement: Secondary | ICD-10-CM | POA: Diagnosis not present

## 2022-08-22 DIAGNOSIS — M159 Polyosteoarthritis, unspecified: Secondary | ICD-10-CM | POA: Diagnosis not present

## 2022-08-22 DIAGNOSIS — M791 Myalgia, unspecified site: Secondary | ICD-10-CM | POA: Diagnosis not present

## 2022-11-16 ENCOUNTER — Telehealth: Payer: Self-pay

## 2022-11-16 NOTE — Patient Outreach (Signed)
  Care Coordination   Initial Visit Note   11/16/2022 Name: Lauren [name] MRN: [id number] DOB: 02/04/1946  Lauren [name] is a 77 y.o. year old female who sees Tower, Audrie Gallus, MD for primary care. I spoke with  Lauren [name] by phone today.  What matters to the patients health and wellness today?  Patient denies having any nursing and/ or community resource needs. Patient states she participated in a 6 month program for dementia prevention. She reports having weight loss and a decrease in weight.   She states she avoids ultra processed foods, no gluten or sugar.  She states she is doing very well.     Goals Addressed             This Visit's Progress    Care coordinator activities - no follow up needed       Interventions Today    Flowsheet Row Most Recent Value  General Interventions   General Interventions Discussed/Reviewed General Interventions Discussed, Doctor Visits  [Care coordination services discussed. SDOH survey completed. AWV discussed and patient advised to contact provider office to schedule. Discussed vaccines. Advised to contact PCP office if care coordination services needed in the future.]  Doctor Visits Discussed/Reviewed Doctor Visits Discussed  Nutrition Interventions   Nutrition Discussed/Reviewed Nutrition Discussed              SDOH assessments and interventions completed:  Yes  SDOH Interventions Today    Flowsheet Row Most Recent Value  SDOH Interventions   Food Insecurity Interventions Intervention Not Indicated  Housing Interventions Intervention Not Indicated  Transportation Interventions Intervention Not Indicated        Care Coordination Interventions:  Yes, provided   Follow up plan: No further intervention required.   Encounter Outcome:  Patient Visit Completed   Lauren [name] [facility] Care Coordination [phone] direct line

## 2022-12-01 ENCOUNTER — Other Ambulatory Visit: Payer: Self-pay | Admitting: Cardiovascular Disease

## 2023-01-03 ENCOUNTER — Other Ambulatory Visit: Payer: Self-pay | Admitting: Cardiovascular Disease

## 2023-01-09 NOTE — Telephone Encounter (Signed)
last visit 06/23/22 with plan to f/u in 9 months  next visit:  none/active recall

## 2023-02-23 DIAGNOSIS — M791 Myalgia, unspecified site: Secondary | ICD-10-CM | POA: Diagnosis not present

## 2023-02-23 DIAGNOSIS — M150 Primary generalized (osteo)arthritis: Secondary | ICD-10-CM | POA: Diagnosis not present

## 2023-02-23 DIAGNOSIS — M3509 Sicca syndrome with other organ involvement: Secondary | ICD-10-CM | POA: Diagnosis not present

## 2023-03-13 ENCOUNTER — Other Ambulatory Visit: Payer: Self-pay | Admitting: Family Medicine

## 2023-03-13 ENCOUNTER — Other Ambulatory Visit: Payer: Self-pay | Admitting: Cardiovascular Disease

## 2023-03-13 DIAGNOSIS — I4821 Permanent atrial fibrillation: Secondary | ICD-10-CM

## 2023-03-14 NOTE — Telephone Encounter (Signed)
Prescription refill request for Eliquis received. Indication: Afib  Last office visit: 06/23/22 Kirke Corin)  Scr: 0.6 (02/23/23)  Age: 78 Weight: 104.5kg  Appropriate dose. Refill sent.

## 2023-03-14 NOTE — Telephone Encounter (Signed)
CPE scheduled 03/29/23, last filled on 03/28/22 #90 caps/ 3 refill

## 2023-03-14 NOTE — Telephone Encounter (Signed)
 Please contact pt for future appointment. Pt due for 6 month f/u.

## 2023-03-14 NOTE — Telephone Encounter (Signed)
 Refill request

## 2023-03-15 ENCOUNTER — Ambulatory Visit: Payer: Medicare Other

## 2023-03-15 VITALS — Ht 68.0 in | Wt 230.0 lb

## 2023-03-15 DIAGNOSIS — Z0000 Encounter for general adult medical examination without abnormal findings: Secondary | ICD-10-CM | POA: Diagnosis not present

## 2023-03-15 NOTE — Patient Instructions (Signed)
 [name] , Thank you for taking time to come for your Medicare Wellness Visit. I appreciate your ongoing commitment to your health goals. Please review the following plan we discussed and let me know if I can assist you in the future.   Referrals/Orders/Follow-Ups/Clinician Recommendations: none  This is a list of the screening recommended for you and due dates:  Health Maintenance  Topic Date Due   Pap Smear  02/14/2012   Flu Shot  09/08/2022   COVID-19 Vaccine (4 - 2024-25 season) 10/09/2022   Medicare Annual Wellness Visit  03/14/2024   Pneumonia Vaccine  Completed   DEXA scan (bone density measurement)  Completed   Hepatitis C Screening  Completed   Zoster (Shingles) Vaccine  Completed   HPV Vaccine  Aged Out   DTaP/Tdap/Td vaccine  Discontinued   Cologuard (Stool DNA test)  Discontinued    Advanced directives: (Copy Requested) Please bring a copy of your health care power of attorney and living will to the office to be added to your chart at your convenience.  Next Medicare Annual Wellness Visit scheduled for next year: Yes 03/15/2024 @ 8:50am televisit

## 2023-03-15 NOTE — Progress Notes (Signed)
 Subjective:   Lauren [name] is a 78 y.o. female who presents for Medicare Annual (Subsequent) preventive examination.  Visit Complete: Virtual I connected with  Neville Sartorius on 03/15/23 by a audio enabled telemedicine application and verified that I am speaking with the correct person using two identifiers.  Patient Location: Home  Provider Location: Office/Clinic  I discussed the limitations of evaluation and management by telemedicine. The patient expressed understanding and agreed to proceed.  Vital Signs: Because this visit was a virtual/telehealth visit, some criteria may be missing or patient reported. Any vitals not documented were not able to be obtained and vitals that have been documented are patient reported.  Patient Medicare AWV questionnaire was completed by the patient on 03/11/2023; I have confirmed that all information answered by patient is correct and no changes since this date.  Cardiac Risk Factors include: advanced age (>81men, >23 women);dyslipidemia;obesity (BMI >30kg/m2)    Objective:    Today's Vitals   03/15/23 0851  Weight: 230 lb (104.3 kg)  Height: 5' 8 (1.727 m)   Body mass index is 34.97 kg/m.     03/15/2023    8:59 AM 02/17/2022   10:52 AM 10/03/2021    8:21 AM 02/15/2021    2:47 PM 06/13/2020   11:00 PM 02/14/2020    2:48 PM 01/21/2019   12:02 PM  Advanced Directives  Does Patient Have a Medical Advance Directive? Yes No No Yes No No Yes  Type of Diplomatic Services Operational Officer   Living will   Healthcare Power of [place];Living will  Does patient want to make changes to medical advance directive?    Yes (MAU/Ambulatory/Procedural Areas - Information given)     Copy of Healthcare Power of Attorney in Chart?       No - copy requested  Would patient like information on creating a medical advance directive?  No - Patient declined Yes (ED - Information included in AVS)  No - Patient declined Yes (MAU/Ambulatory/Procedural Areas -  Information given)     Current Medications (verified) Outpatient Encounter Medications as of 03/15/2023  Medication Sig   acetaminophen  (TYLENOL ) 500 MG tablet Take 1,000 mg by mouth every 6 (six) hours as needed for moderate pain.   apixaban  (ELIQUIS ) 5 MG TABS tablet Take 1 tablet by mouth twice daily   Carboxymethylcellul-Glycerin (LUBRICATING EYE DROPS OP) Place 1 drop into both eyes 5 (five) times daily.   cetirizine (ZYRTEC) 10 MG tablet Take 10 mg by mouth at bedtime.   DULoxetine  (CYMBALTA ) 30 MG capsule Take 1 capsule by mouth once daily   EPINEPHrine  0.3 mg/0.3 mL IJ SOAJ injection Inject 0.3 mLs (0.3 mg total) into the muscle once. (Patient taking differently: Inject 0.3 mg into the muscle as needed for anaphylaxis.)   Evolocumab  (REPATHA  SURECLICK) 140 MG/ML SOAJ INJECT ONE DOSE SUBCUTANEOUSLY EVERY 14 DAYS   ezetimibe  (ZETIA ) 10 MG tablet Take 1 tablet (10 mg total) by mouth daily.   Fish Oil-Cholecalciferol (OMEGA-3 + VITAMIN D3 PO) Take 2 capsules by mouth daily.    fluticasone (FLONASE) 50 MCG/ACT nasal spray Place into both nostrils daily as needed for allergies or rhinitis.   gabapentin  (NEURONTIN ) 300 MG capsule Take one by mouth in the am, one mid day and 2 at bedtime   methocarbamol  (ROBAXIN ) 500 MG tablet Take 1 tablet (500 mg total) by mouth 2 (two) times daily as needed for muscle spasms. Cauttion of sedation   metoprolol  tartrate (LOPRESSOR ) 25 MG tablet Take 1 tablet  by mouth twice daily   oxybutynin  (OXYTROL ) 3.9 MG/24HR Place 1 patch onto the skin 2 (two) times a week.   pilocarpine  (SALAGEN ) 5 MG tablet Take 1 tablet (5 mg total) by mouth 3 (three) times daily.   silver  sulfADIAZINE  (SILVADENE ) 1 % cream Apply 1 Application topically daily.   vitamin B-12 (CYANOCOBALAMIN ) 1000 MCG tablet Take 1,000 mcg by mouth daily.   vitamin C (ASCORBIC ACID) 500 MG tablet Take 500 mg by mouth daily.   No facility-administered encounter medications on file as of 03/15/2023.     Allergies (verified) Crestor  [rosuvastatin ], Lipitor [atorvastatin], Statins, and Wasp venom   History: Past Medical History:  Diagnosis Date   Breast cancer (HCC) 2002   DCIS; BRCA 1 pos HER2 pos, ER2 pos   DDD (degenerative disc disease)    DNR (do not resuscitate) 06/13/2020   GERD (gastroesophageal reflux disease)    History of echocardiogram    a. 06/2020 Echo: EF 55-60%, no rwma, nl RV size/fxn. Mildly dil LA.   HLD (hyperlipidemia)    statin intolerant   Hyperglycemia 10/2007   A1C elvated to 6.2   Obstructive sleep apnea    Osteoarthritis    Osteopenia 2007   ? from xray with nl dexa   Persistent atrial fibrillation (HCC)    a. Dx 06/2020 in setting of stroke. CHA2DS2VASc = 5-->Eliquis ; b. 08/2020 Successful DCCV; c. 09/2020 Office f/u- pt in rate-controlled Afib.   Reactive depression (situational)    very well controlled, and now takes prozac  for sleep   Sjogren's syndrome (HCC)    Stroke (HCC) 06/2020   Urine incontinence    Vitamin D  deficiency    Past Surgical History:  Procedure Laterality Date   BILATERAL OOPHORECTOMY  205   CARDIOVERSION N/A 08/24/2020   Procedure: CARDIOVERSION;  Surgeon: Darron Deatrice LABOR, MD;  Location: [facility] ORS;  Service: Cardiovascular;  Laterality: N/A;   CATARACT EXTRACTION, BILATERAL     10/2019, 12/2019   COLONOSCOPY  5/07   normal; recheck 3 years   LUMBAR DISC SURGERY  1991   Ruptured disk L5   MASTECTOMY  2001   bilateral   TONSILLECTOMY  1974   TOTAL HIP ARTHROPLASTY  2011   TOTAL KNEE ARTHROPLASTY  2006   bilateral   Family History  Problem Relation Age of Onset   Arthritis Other        family hx   Breast cancer Other        1st degree relative <50   Diabetes Other        1st degree relative   Hyperlipidemia Other        family hx   Hypertension Other        family hx   Ovarian cancer Other        family hx   Prostate cancer Other        1st degree relative <50   Dementia Mother    Psoriasis Brother     Other Daughter        BRCA gene   Brain cancer Brother    Stroke Maternal Grandmother    Social History   Socioeconomic History   Marital status: Widowed    Spouse name: Not on file   Number of children: 3   Years of education: Not on file   Highest education level: Master's degree (e.g., MA, MS, MEng, MEd, MSW, MBA)  Occupational History   Occupation: Retired  Tobacco Use   Smoking status: Never  Smokeless tobacco: Never  Vaping Use   Vaping status: Never Used  Substance and Sexual Activity   Alcohol use: Not Currently    Alcohol/week: 0.0 standard drinks of alcohol    Comment: rare, none in 2 years   Drug use: No   Sexual activity: Not Currently  Other Topics Concern   Not on file  Social History Narrative   Retired Psychiatric Nurse from [employer]      Divorced      G3P3      Regular exercise-yes   Social Drivers of Health   Financial Resource Strain: Low Risk  (03/11/2023)   Overall Financial Resource Strain (CARDIA)    Difficulty of Paying Living Expenses: Not very hard  Food Insecurity: No Food Insecurity (03/11/2023)   Hunger Vital Sign    Worried About Running Out of Food in the Last Year: Never true    Ran Out of Food in the Last Year: Never true  Transportation Needs: No Transportation Needs (03/11/2023)   PRAPARE - Administrator, Civil Service (Medical): No    Lack of Transportation (Non-Medical): No  Physical Activity: Insufficiently Active (03/11/2023)   Exercise Vital Sign    Days of Exercise per Week: 3 days    Minutes of Exercise per Session: 20 min  Stress: No Stress Concern Present (03/11/2023)   [employer] of Occupational Health - Occupational Stress Questionnaire    Feeling of Stress : Only a little  Social Connections: Moderately Integrated (03/11/2023)   Social Connection and Isolation Panel [NHANES]    Frequency of Communication with Friends and Family: More than three times a week    Frequency of Social  Gatherings with Friends and Family: Three times a week    Attends Religious Services: 1 to 4 times per year    Active Member of Clubs or Organizations: Yes    Attends Banker Meetings: 1 to 4 times per year    Marital Status: Widowed    Tobacco Counseling Counseling given: Not Answered   Clinical Intake:  Pre-visit preparation completed: Yes  Pain : No/denies pain     BMI - recorded: 34.97 Nutritional Status: BMI > 30  Obese Nutritional Risks: None Diabetes: No  How often do you need to have someone help you when you read instructions, pamphlets, or other written materials from your doctor or pharmacy?: 1 - Never  Interpreter Needed?: No  Comments: lives alone Information entered by :: B.Mindy Behnken,LPN   Activities of Daily Living    03/11/2023   10:02 AM  In your present state of health, do you have any difficulty performing the following activities:  Hearing? 0  Vision? 0  Difficulty concentrating or making decisions? 0  Walking or climbing stairs? 0  Dressing or bathing? 0  Doing errands, shopping? 0  Preparing Food and eating ? N  Using the Toilet? N  In the past six months, have you accidently leaked urine? N  Do you have problems with loss of bowel control? N  Managing your Medications? N  Managing your Finances? N  Housekeeping or managing your Housekeeping? N    Patient Care Team: Tower, Laine LABOR, MD as PCP - General Darron Deatrice LABOR, MD as PCP - Cardiology (Cardiology) Katheleen Rush, OD as Physician Assistant (Optometry) Carolee Manus DASEN., MD as Referring Physician (Ophthalmology) Verdia Art, MD as Consulting Physician (Pulmonary Disease) Cesario Offer, DMD, PA as Referring Physician (Dentistry) Fate Morna SAILOR, [facility] (Inactive) as  Pharmacist (Pharmacist)  Indicate any recent Medical Services you may have received from other than [facility] providers in the past year (date may be approximate).     Assessment:   This is a routine  wellness examination for Lauren [name].  Hearing/Vision screen Hearing Screening - Comments:: Pt says she thought her hearing is not as good but recent tests says her hearing is good Vision Screening - Comments:: Pt says vision good after cataract surgery only readers now   Goals Addressed             This Visit's Progress    Care coordinator activities - no follow up needed   On track    Interventions Today    Flowsheet Row Most Recent Value  General Interventions   General Interventions Discussed/Reviewed General Interventions Discussed, Doctor Visits  [Care coordination services discussed. SDOH survey completed. AWV discussed and patient advised to contact provider office to schedule. Discussed vaccines. Advised to contact PCP office if care coordination services needed in the future.]  Doctor Visits Discussed/Reviewed Doctor Visits Discussed  Nutrition Interventions   Nutrition Discussed/Reviewed Nutrition Discussed           DIET - EAT MORE FRUITS AND VEGETABLES   On track    03/15/23 continue this goal     Increase physical activity   Not on track    Update 03/15/23- I will continue to walk at least 20 min daily.      COMPLETED: Patient Stated       01/21/2019, I will try to eat a healthier diet and lessen my sugar intake.      Patient Stated   On track    02/14/2020, I will maintain and continue medications as prescribed.      Patient Stated   On track    Would like to maintain current routine       Depression Screen    03/15/2023    8:56 AM 03/28/2022    8:09 AM 02/17/2022   10:50 AM 02/15/2021    2:51 PM 02/14/2020    2:49 PM 01/21/2019   12:03 PM 01/11/2018    9:10 AM  PHQ 2/9 Scores  PHQ - 2 Score 0 0 0 0 0 0 0  PHQ- 9 Score  5 0  0 0 0    Fall Risk    03/11/2023   10:02 AM 03/28/2022    8:09 AM 02/17/2022   10:52 AM 02/14/2022    6:38 PM 02/15/2021    2:50 PM  Fall Risk   Falls in the past year? 0 1 1 1  0  Number falls in past yr:  0 0 0 0  Injury with Fall?  1 1  1  0  Risk for fall due to : No Fall Risks History of fall(s) History of fall(s)    Follow up Falls prevention discussed;Education provided Falls evaluation completed Falls prevention discussed;Falls evaluation completed  Falls prevention discussed    MEDICARE RISK AT HOME: Medicare Risk at Home Any stairs in or around the home?: (Patient-Rptd) Yes If so, are there any without handrails?: (Patient-Rptd) No Home free of loose throw rugs in walkways, pet beds, electrical cords, etc?: (Patient-Rptd) Yes Adequate lighting in your home to reduce risk of falls?: (Patient-Rptd) Yes Life alert?: (Patient-Rptd) No Use of a cane, walker or w/c?: (Patient-Rptd) No Grab bars in the bathroom?: (Patient-Rptd) Yes Shower chair or bench in shower?: (Patient-Rptd) No Elevated toilet seat or a handicapped toilet?: (Patient-Rptd) No  TIMED UP AND  GO:  Was the test performed?  No    Cognitive Function:    02/14/2020    2:52 PM 01/21/2019   12:06 PM 01/11/2018    9:10 AM 01/04/2017    8:17 AM 12/22/2015   10:25 AM  MMSE - Mini Mental State Exam  Orientation to time 5 5 5 5 5   Orientation to Place 5 5 5 5 5   Registration 3 3 3 3 3   Attention/ Calculation 5 5 0 0 0  Recall 3 3 3 3 3   Language- name 2 objects   0 0 0  Language- repeat 1 1 1 1 1   Language- follow 3 step command   3 3 3   Language- read & follow direction   0 0 0  Write a sentence   0 0 0  Copy design   0 0 0  Total score   20 20 20         03/15/2023    9:03 AM 02/17/2022   10:57 AM  6CIT Screen  What Year? 0 points 0 points  What month? 0 points 0 points  What time? 0 points 0 points  Count back from 20 0 points 0 points  Months in reverse 0 points 0 points  Repeat phrase 0 points 0 points  Total Score 0 points 0 points    Immunizations Immunization History  Administered Date(s) Administered   HPV Bivalent 11/10/2016   Influenza Split 11/26/2010   Influenza Whole 12/06/2009   Influenza, High Dose Seasonal PF  12/31/2019, 11/02/2020   Influenza, Seasonal, Injecte, Preservative Fre 10/23/2015   Influenza,inj,Quad PF,6+ Mos 11/26/2013, 11/10/2014, 11/10/2016, 11/22/2017, 10/30/2018   Influenza-Unspecified 11/26/2013, 11/10/2014, 11/10/2016, 11/22/2017, 10/30/2018   PFIZER(Purple Top)SARS-COV-2 Vaccination 02/28/2019, 03/21/2019, 12/27/2019   Pneumococcal Conjugate-13 12/24/2014, 05/31/2016   Pneumococcal Polysaccharide-23 01/19/2010, 01/08/2018   Td 10/13/2006   Zoster Recombinant(Shingrix) 05/31/2016, 08/23/2016   Zoster, Live 02/07/2005    TDAP status: Up to date  Flu Vaccine status: Declined, Education has been provided regarding the importance of this vaccine but patient still declined. Advised may receive this vaccine at local pharmacy or Health Dept. Aware to provide a copy of the vaccination record if obtained from local pharmacy or Health Dept. Verbalized acceptance and understanding.  Pneumococcal vaccine status: Up to date  Covid-19 vaccine status: Completed vaccines  Qualifies for Shingles Vaccine? Yes   Zostavax completed Yes   Shingrix Completed?: Yes  Screening Tests Health Maintenance  Topic Date Due   Cervical Cancer Screening (Pap smear)  02/14/2012   INFLUENZA VACCINE  09/08/2022   COVID-19 Vaccine (4 - 2024-25 season) 10/09/2022   Medicare Annual Wellness (AWV)  03/14/2024   Pneumonia Vaccine 28+ Years old  Completed   DEXA SCAN  Completed   Hepatitis C Screening  Completed   Zoster Vaccines- Shingrix  Completed   HPV VACCINES  Aged Out   DTaP/Tdap/Td  Discontinued   Fecal DNA (Cologuard)  Discontinued    Health Maintenance  Health Maintenance Due  Topic Date Due   Cervical Cancer Screening (Pap smear)  02/14/2012   INFLUENZA VACCINE  09/08/2022   COVID-19 Vaccine (4 - 2024-25 season) 10/09/2022    Colorectal cancer screening: No longer required.   Mammogram status: No longer required due to age.  Bone Density status: Completed 05/02/2022. Results  reflect: Bone density results: NORMAL. Repeat every 5 years.  Lung Cancer Screening: (Low Dose CT Chest recommended if Age 34-80 years, 20 pack-year currently smoking OR have quit w/in 15years.) does not qualify.  Lung Cancer Screening Referral: no  Additional Screening:  Hepatitis C Screening: does not qualify; Completed 02/13/2018  Vision Screening: Recommended annual ophthalmology exams for early detection of glaucoma and other disorders of the eye. Is the patient up to date with their annual eye exam?  Yes  Who is the provider or what is the name of the office in which the patient attends annual eye exams? Dr Carolee If pt is not established with a provider, would they like to be referred to a provider to establish care? No .   Dental Screening: Recommended annual dental exams for proper oral hygiene  Diabetic Foot Exam: n/a  Community Resource Referral / Chronic Care Management: CRR required this visit?  No   CCM required this visit?  No    Plan:     I have personally reviewed and noted the following in the patient's chart:   Medical and social history Use of alcohol, tobacco or illicit drugs  Current medications and supplements including opioid prescriptions. Patient is not currently taking opioid prescriptions. Functional ability and status Nutritional status Physical activity Advanced directives List of other physicians Hospitalizations, surgeries, and ER visits in previous 12 months Vitals Screenings to include cognitive, depression, and falls Referrals and appointments  In addition, I have reviewed and discussed with patient certain preventive protocols, quality metrics, and best practice recommendations. A written personalized care plan for preventive services as well as general preventive health recommendations were provided to patient.    Erminio LITTIE Saris, LPN   08/10/7972   After Visit Summary: (MyChart) Due to this being a telephonic visit, the after visit  summary with patients personalized plan was offered to patient via MyChart   Nurse Notes: The patient states she is doing well and has no concerns or questions at this time.

## 2023-03-29 ENCOUNTER — Other Ambulatory Visit: Payer: Medicare Other

## 2023-03-29 ENCOUNTER — Encounter: Payer: Medicare Other | Admitting: Family Medicine

## 2023-03-30 ENCOUNTER — Encounter: Payer: Self-pay | Admitting: Medical

## 2023-03-30 ENCOUNTER — Ambulatory Visit: Payer: Medicare Other | Attending: Medical | Admitting: Medical

## 2023-03-30 VITALS — BP 130/68 | HR 92 | Ht 68.0 in | Wt 235.0 lb

## 2023-03-30 DIAGNOSIS — I4821 Permanent atrial fibrillation: Secondary | ICD-10-CM | POA: Insufficient documentation

## 2023-03-30 DIAGNOSIS — I639 Cerebral infarction, unspecified: Secondary | ICD-10-CM | POA: Diagnosis not present

## 2023-03-30 MED ORDER — APIXABAN 5 MG PO TABS
5.0000 mg | ORAL_TABLET | Freq: Two times a day (BID) | ORAL | 3 refills | Status: AC
Start: 2023-03-30 — End: ?

## 2023-03-30 MED ORDER — METOPROLOL TARTRATE 25 MG PO TABS
25.0000 mg | ORAL_TABLET | Freq: Two times a day (BID) | ORAL | 3 refills | Status: AC
Start: 2023-03-30 — End: ?

## 2023-03-30 MED ORDER — REPATHA SURECLICK 140 MG/ML ~~LOC~~ SOAJ: SUBCUTANEOUS | 3 refills | Status: DC

## 2023-03-30 NOTE — Progress Notes (Signed)
 Cardiology Office Note:  .   Date:  03/30/2023  ID:  Lauren [name], DOB 08-Aug-1945, MRN [id number] PCP: [name], MD  [facility] Providers Cardiologist:  Lorine Bears, MD {    History of Present Illness: Marland Kitchen   Lauren [name] is a 78 y.o. female with a hx of breast cancer status post bilateral mastectomies, stroke in the setting of permanent atrial fibrillation, hyperlipidemia with intolerance to statins, borderline diabetes, osteopenia, reactive depression, Sjogren's syndrome, urinary incontinence, obstructive sleep apnea, and vitamin D deficiency, presents for 6 month follow-up.   She was hospitalized in May 2022 with acute onset of slurred speech, facial droop, and aphasia.  MRI confirmed stroke.  EKG showed atrial fibrillation.  She was started on Eliquis and beta-blocker therapy with good rate control.  Echocardiogram showed normal LV systolic function without significant valvular abnormalities.  The left atrium was mildly dilated.  On further questioning, she reported palpitations dating back to at least February 2022 and was subsequently told that she had an irregular heart rhythm by pulmonology in March 2022.  Metoprolol was increased to 25 mg twice daily at outpatient follow-up on May 19, and she remained in rate controlled atrial fibrillation at office follow-up on July 15.  She was not experiencing any symptoms related atrial fibrillation.  Decision was made to pursue cardioversion, which was successfully performed on July 18. The patient was seen in follow-up 09/21/20 and was back in Afib, plan for rate control.   The patient was last seen 06/2022 and was doing well from a cardiac perspective.   Today, the patient is overall doing well. She denies bleeding issues with Eliquis. No heart racing or fluttering. She changed her diet (no sugar, no gluten, no highly processed foods), and this had greatly improved her life. She does no formal exercise.    Studies Reviewed: Marland Kitchen   EKG  Interpretation Date/Time:  Thursday March 30 2023 13:14:20 EST Ventricular Rate:  92 PR Interval:    QRS Duration:  84 QT Interval:  356 QTC Calculation: 440 R Axis:   -28  Text Interpretation: Atrial fibrillation Nonspecific ST abnormality When compared with ECG of 24-Aug-2020 07:45, PREVIOUS ECG IS PRESENT Confirmed by Fransico Michael, Dearia Wilmouth ([id number]) on 03/30/2023 1:17:47 PM    Echo 2022 1. Left ventricular ejection fraction, by estimation, is 55 to 60%. The  left ventricle has normal function. The left ventricle has no regional  wall motion abnormalities. Left ventricular diastolic function could not  be evaluated.   2. Right ventricular systolic function is normal. The right ventricular  size is normal. There is normal pulmonary artery systolic pressure.   3. Left atrial size was mildly dilated.   4. The mitral valve is normal in structure. No evidence of mitral valve  regurgitation. No evidence of mitral stenosis.   5. The aortic valve is normal in structure. Aortic valve regurgitation is  not visualized. No aortic stenosis is present.   6. The inferior vena cava is normal in size with greater than 50%  respiratory variability, suggesting right atrial pressure of 3 mmHg.       Physical Exam:   VS:  BP 130/68 (BP Location: Left Arm, Patient Position: Sitting, Cuff Size: Normal)   Pulse 92   Ht 5\' 8"  (1.727 m)   Wt 235 lb (106.6 kg)   SpO2 98%   BMI 35.73 kg/m    Wt Readings from Last 3 Encounters:  03/30/23 235 lb (106.6 kg)  03/15/23 230 lb (104.3 kg)  06/23/22 230 lb 6 oz (104.5 kg)    GEN: Well nourished, well developed in no acute distress NECK: No JVD; No carotid bruits CARDIAC: Irreg IRreg, no murmurs, rubs, gallops RESPIRATORY:  Clear to auscultation without rales, wheezing or rhonchi  ABDOMEN: Soft, non-tender, non-distended EXTREMITIES:  No edema; No deformity   ASSESSMENT AND PLAN: .    Permanent Afib Afib rates are controlled on Lopressor. She denies bleeding  issues on Eliquis. Recent labs stable. She denies heart racing or palpitations. Continue Eliquis 5mg  BID and Lopressor 25mg  daily, I will send in refills today.  HLD LDL 71, TG 114, HDL 42., total chol 136. Continue Repatha, I will refill this today.   OSA She is compliant with CPAP.      Dispo: Follow-up in 1 year  Signed, Darren Nodal David Stall, PA-C

## 2023-03-30 NOTE — Patient Instructions (Signed)
 Medication Instructions:  No changes at this time.   *If you need a refill on your cardiac medications before your next appointment, please call your pharmacy*   Lab Work: None  If you have labs (blood work) drawn today and your tests are completely normal, you will receive your results only by: MyChart Message (if you have MyChart) OR A paper copy in the mail If you have any lab test that is abnormal or we need to change your treatment, we will call you to review the results.   Testing/Procedures: None   Follow-Up: At [facility], you and your health needs are our priority.  As part of our continuing mission to provide you with exceptional heart care, we have created designated Provider Care Teams.  These Care Teams include your primary Cardiologist (physician) and Advanced Practice Providers (APPs -  Physician Assistants and Nurse Practitioners) who all work together to provide you with the care you need, when you need it.   Your next appointment:   1 year(s)  Provider:   Lorine Bears, MD or Cadence Fransico Michael, [place]

## 2023-04-05 ENCOUNTER — Encounter: Payer: Medicare Other | Admitting: Family Medicine

## 2023-04-14 ENCOUNTER — Ambulatory Visit: Payer: Medicare Other | Admitting: Family Medicine

## 2023-04-14 ENCOUNTER — Encounter: Payer: Self-pay | Admitting: Family Medicine

## 2023-04-14 VITALS — BP 116/82 | HR 78 | Temp 97.6°F | Ht 66.75 in | Wt 230.4 lb

## 2023-04-14 DIAGNOSIS — E538 Deficiency of other specified B group vitamins: Secondary | ICD-10-CM

## 2023-04-14 DIAGNOSIS — Z853 Personal history of malignant neoplasm of breast: Secondary | ICD-10-CM

## 2023-04-14 DIAGNOSIS — M7918 Myalgia, other site: Secondary | ICD-10-CM | POA: Diagnosis not present

## 2023-04-14 DIAGNOSIS — E7800 Pure hypercholesterolemia, unspecified: Secondary | ICD-10-CM | POA: Diagnosis not present

## 2023-04-14 DIAGNOSIS — Z66 Do not resuscitate: Secondary | ICD-10-CM

## 2023-04-14 DIAGNOSIS — E559 Vitamin D deficiency, unspecified: Secondary | ICD-10-CM | POA: Diagnosis not present

## 2023-04-14 DIAGNOSIS — E669 Obesity, unspecified: Secondary | ICD-10-CM | POA: Diagnosis not present

## 2023-04-14 DIAGNOSIS — R7989 Other specified abnormal findings of blood chemistry: Secondary | ICD-10-CM

## 2023-04-14 DIAGNOSIS — R7303 Prediabetes: Secondary | ICD-10-CM | POA: Diagnosis not present

## 2023-04-14 DIAGNOSIS — I4821 Permanent atrial fibrillation: Secondary | ICD-10-CM | POA: Diagnosis not present

## 2023-04-14 LAB — TSH: TSH: 0.32 u[IU]/mL — ABNORMAL LOW (ref 0.35–5.50)

## 2023-04-14 LAB — LIPID PANEL
Cholesterol: 138 mg/dL (ref 0–200)
HDL: 53 mg/dL (ref >39.00)
LDL Cholesterol: 66 mg/dL (ref 0–99)
NonHDL: 85.34
Total CHOL/HDL Ratio: 3
Triglycerides: 96 mg/dL (ref 0.0–149.0)
VLDL: 19.2 mg/dL (ref 0.0–40.0)

## 2023-04-14 LAB — VITAMIN D 25 HYDROXY (VIT D DEFICIENCY, FRACTURES): VITD: 36.44 ng/mL (ref 30.00–100.00)

## 2023-04-14 LAB — VITAMIN B12: Vitamin B-12: 379 pg/mL (ref 211–911)

## 2023-04-14 LAB — HEMOGLOBIN A1C: Hgb A1c MFr Bld: 6.1 % (ref 4.6–6.5)

## 2023-04-14 MED ORDER — DULOXETINE HCL 30 MG PO CPEP: 30.0000 mg | ORAL_CAPSULE | Freq: Every day | ORAL | 3 refills | Status: AC

## 2023-04-14 MED ORDER — METHOCARBAMOL 500 MG PO TABS: 500.0000 mg | ORAL_TABLET | Freq: Two times a day (BID) | ORAL | 1 refills | Status: AC | PRN

## 2023-04-14 MED ORDER — GABAPENTIN 300 MG PO CAPS: ORAL_CAPSULE | ORAL | 2 refills | Status: AC

## 2023-04-14 NOTE — Assessment & Plan Note (Signed)
 Given advance directive packet to update

## 2023-04-14 NOTE — Assessment & Plan Note (Signed)
 D level today

## 2023-04-14 NOTE — Assessment & Plan Note (Signed)
 Under rheum care Increased her gabapentin to 600 and 900 pm Occational robaxin Cymbalta daily  Water exercise when able

## 2023-04-14 NOTE — Assessment & Plan Note (Signed)
 Double mastectomy  No change on exam

## 2023-04-14 NOTE — Assessment & Plan Note (Signed)
 B12 today.

## 2023-04-14 NOTE — Assessment & Plan Note (Signed)
 Labs today  Repatha 140 mg   Used to take zetia Under care of cardiology  Disc goals for lipids and reasons to control them Rev last labs with pt Rev low sat fat diet in detail

## 2023-04-14 NOTE — Patient Instructions (Addendum)
 Stay active  Add some strength training to your routine, this is important for bone and brain health and can reduce your risk of falls and help your body use insulin properly and regulate weight  Light weights, exercise bands , and internet videos are a good way to start  Yoga (chair or regular), machines , floor exercises or a gym with machines are also good options    Take care of yourself  Labs today    Keep eating a healthy diet

## 2023-04-14 NOTE — Assessment & Plan Note (Signed)
 Discussed how this problem influences overall health and the risks it imposes  Reviewed plan for weight loss with lower calorie diet (via better food choices (lower glycemic and portion control) along with exercise building up to or more than 30 minutes 5 days per week including some aerobic activity and strength training    Diet much improved Encouraged strength building exercise as tol

## 2023-04-14 NOTE — Assessment & Plan Note (Signed)
 Doing well under care of cardiology  Continues metoprolol 25 mg bid and eliquis

## 2023-04-14 NOTE — Progress Notes (Signed)
 Subjective:    Patient ID: Lauren [name], female    DOB: 1945/12/19, 78 y.o.   MRN: [id number]  HPI Pt presents for annual follow up of chronic medical problems   Wt Readings from Last 3 Encounters:  04/14/23 230 lb 6 oz (104.5 kg)  03/30/23 235 lb (106.6 kg)  03/15/23 230 lb (104.3 kg)   36.35 kg/m  Vitals:   04/14/23 0951  BP: 116/82  Pulse: 78  Temp: 97.6 F (36.4 C)  SpO2: 97%    Immunization History  Administered Date(s) Administered   HPV Bivalent 11/10/2016   Influenza Split 11/26/2010   Influenza Whole 12/06/2009   Influenza, High Dose Seasonal PF 12/31/2019, 11/02/2020   Influenza, Seasonal, Injecte, Preservative Fre 10/23/2015   Influenza,inj,Quad PF,6+ Mos 11/26/2013, 11/10/2014, 11/10/2016, 11/22/2017, 10/30/2018   Influenza-Unspecified 11/26/2013, 11/10/2014, 11/10/2016, 11/22/2017, 10/30/2018   PFIZER(Purple Top)SARS-COV-2 Vaccination 02/28/2019, 03/21/2019, 12/27/2019   Pneumococcal Conjugate-13 12/24/2014, 05/31/2016   Pneumococcal Polysaccharide-23 01/19/2010, 01/08/2018   Td 10/13/2006   Zoster Recombinant(Shingrix) 05/31/2016, 08/23/2016   Zoster, Live 02/07/2005    There are no preventive care reminders to display for this patient.  Flu shot -declines   Mammogram- history of double mastectomy in past  Self breast exam- nothing new   Gyn health No problems     Colon cancer screening  Cologuard 04/2020 -done with screening    Bone health  Dexa 04/2022 normal Falls-none  Fractures-none  Supplements  Last vitamin D Lab Results  Component Value Date   VD25OH 36.44 04/14/2023    Exercise :  Swimming in the summer  Some walking and that increases in good weather     Mood    04/14/2023    9:57 AM 03/15/2023    8:56 AM 03/28/2022    8:09 AM 02/17/2022   10:50 AM 02/15/2021    2:51 PM  Depression screen PHQ 2/9  Decreased Interest 0 0 0 0 0  Down, Depressed, Hopeless 0 0 0 0 0  PHQ - 2 Score 0 0 0 0 0  Altered sleeping 1  3 0    Tired, decreased energy 1  2 0   Change in appetite 0  0 0   Feeling bad or failure about yourself  0  0 0   Trouble concentrating 0  0 0   Moving slowly or fidgety/restless 0  0 0   Suicidal thoughts 0  0 0   PHQ-9 Score 2  5 0   Difficult doing work/chores Not difficult at all  Somewhat difficult Not difficult at all    Cymbalta for myofascial pain syndrome  Trigger points move around   Gabapentin - 600 am and 900 in pm- really helping  Sees Dr Allena Katz -rheumatology   Also magnesium at night/ helps sleep  No GI side effects   A fib  Metoprolol 25 mg bid  Eliquis  Card care BP Readings from Last 3 Encounters:  04/14/23 116/82  03/30/23 130/68  06/23/22 110/70   Pulse Readings from Last 3 Encounters:  04/14/23 78  03/30/23 92  06/23/22 83     Hyperlipidemia Lab Results  Component Value Date   CHOL 138 04/14/2023   HDL 53.00 04/14/2023   LDLCALC 66 04/14/2023   LDLDIRECT 148.9 08/20/2012   TRIG 96.0 04/14/2023   CHOLHDL 3 04/14/2023   Zetia Repatha   Prediabetes Lab Results  Component Value Date   HGBA1C 6.1 04/14/2023   HGBA1C 6.3 03/17/2022   HGBA1C 6.5 (A) 08/23/2021   Due  for labs   Sjogren's dz  Uses pilocarpine   Lab Results  Component Value Date   VITAMINB12 379 04/14/2023   Not eating sugar  Not eating gluten  Not eating processed foods at all  Feels a lot better ! Does not eat out  Thrilled with that   From rheum Cmet in jan -normal  Normal cbc with hb of 14.6  Sed rate was 31    Patient Active Problem List   Diagnosis Date Noted   IBS (irritable bowel syndrome) 05/24/2021   Atrial fibrillation (HCC) 09/29/2020   Myofascial pain 09/29/2020   History of CVA (cerebrovascular accident) 06/13/2020   DNR (do not resuscitate) 06/13/2020   Dermatochalasis of both upper eyelids 07/15/2019   Ptosis of both upper eyelids 07/15/2019   Carpal tunnel syndrome 01/25/2019   Age-related nuclear cataract, bilateral 06/19/2018   Vitamin B12  deficiency 03/27/2018   Sjogren's disease (HCC) 01/15/2018   Osteoarthritis 01/15/2018   Obesity (BMI 30-39.9) 01/15/2018   H/O herpes zoster 11/22/2017   Estrogen deficiency 01/09/2017   Routine general medical examination at a health care facility 01/01/2017   OSA (obstructive sleep apnea) 09/18/2016   Bee sting allergy 06/10/2014   Encounter for Medicare annual wellness exam 12/03/2013   History of shingles 11/27/2013   Hx of breast cancer 08/22/2011   Encounter for gynecological examination 02/14/2011   Bilateral dry eyes 01/20/2011   Dry eye syndrome of bilateral lacrimal glands 01/20/2011   Vitamin D deficiency 04/22/2008   Hyperlipidemia 04/22/2008   GERD 04/22/2008   Prediabetes 04/22/2008   Past Medical History:  Diagnosis Date   Breast cancer (HCC) 2002   DCIS; BRCA 1 pos HER2 pos, ER2 pos   Cataract Removed 2021   DDD (degenerative disc disease)    DNR (do not resuscitate) 06/13/2020   GERD (gastroesophageal reflux disease)    History of echocardiogram    a. 06/2020 Echo: EF 55-60%, no rwma, nl RV size/fxn. Mildly dil LA.   HLD (hyperlipidemia)    statin intolerant   Hyperglycemia 10/2007   A1C elvated to 6.2   Neuromuscular disorder (HCC) Carpal Tunnel L and R   Obstructive sleep apnea    Osteoarthritis    Osteopenia 2007   ? from xray with nl dexa   Oxygen deficiency 10/18   Persistent atrial fibrillation (HCC)    a. Dx 06/2020 in setting of stroke. CHA2DS2VASc = 5-->Eliquis; b. 08/2020 Successful DCCV; c. 09/2020 Office f/u- pt in rate-controlled Afib.   Reactive depression (situational)    very well controlled, and now takes prozac for sleep   Sjogren's syndrome (HCC)    Sleep apnea 10/18   Stroke (HCC) 06/2020   Urine incontinence    Vitamin D deficiency    Past Surgical History:  Procedure Laterality Date   BILATERAL OOPHORECTOMY  03/2003   BREAST SURGERY  Bilateral breast removed 2001   CARDIOVERSION N/A 08/24/2020   Procedure: CARDIOVERSION;   Surgeon: Iran Ouch, MD;  Location: [facility] ORS;  Service: Cardiovascular;  Laterality: N/A;   CATARACT EXTRACTION, BILATERAL     10/2019, 12/2019   COLONOSCOPY  06/2005   normal; recheck 3 years   EYE SURGERY  cataracts 2021   JOINT REPLACEMENT  Bilateral knee 2006 hip 2010   LUMBAR DISC SURGERY  1991   Ruptured disk L5   MASTECTOMY  2001   bilateral   TONSILLECTOMY  1974   TOTAL HIP ARTHROPLASTY  2011   TOTAL KNEE ARTHROPLASTY  2006  bilateral   TUBAL LIGATION  1982   Social History   Tobacco Use   Smoking status: Never   Smokeless tobacco: Never  Vaping Use   Vaping status: Never Used  Substance Use Topics   Alcohol use: Not Currently    Comment: rare, none in 2 years   Drug use: Never   Family History  Problem Relation Age of Onset   Arthritis Other        family hx   Breast cancer Other        1st degree relative <50   Diabetes Other        1st degree relative   Hyperlipidemia Other        family hx   Hypertension Other        family hx   Ovarian cancer Other        family hx   Prostate cancer Other        1st degree relative <50   Dementia Mother    Cancer Mother    Hearing loss Mother    Psoriasis Brother    Cancer Brother    Obesity Brother    Other Daughter        BRCA gene   ADD / ADHD Daughter    Cancer Daughter    Brain cancer Brother    Cancer Brother    Stroke Maternal Grandmother    Heart disease Maternal Grandmother    Arthritis Father    Cancer Father    Hearing loss Father    Alcohol abuse Maternal Grandfather    Allergies  Allergen Reactions   Crestor [Rosuvastatin]     myalgias   Lipitor [Atorvastatin]     myalgias   Statins     joint pain   Wasp Venom Swelling   Current Outpatient Medications on File Prior to Visit  Medication Sig Dispense Refill   apixaban (ELIQUIS) 5 MG TABS tablet Take 1 tablet (5 mg total) by mouth 2 (two) times daily. 180 tablet 3   Evolocumab (REPATHA SURECLICK) 140 MG/ML SOAJ INJECT ONE DOSE  SUBCUTANEOUSLY EVERY 14 DAYS 6 mL 3   metoprolol tartrate (LOPRESSOR) 25 MG tablet Take 1 tablet (25 mg total) by mouth 2 (two) times daily. 180 tablet 3   No current facility-administered medications on file prior to visit.    Review of Systems  Constitutional:  Negative for activity change, appetite change, fatigue, fever and unexpected weight change.  HENT:  Negative for congestion, ear pain, rhinorrhea, sinus pressure and sore throat.   Eyes:  Negative for pain, redness and visual disturbance.  Respiratory:  Negative for cough, shortness of breath and wheezing.   Cardiovascular:  Negative for chest pain and palpitations.  Gastrointestinal:  Negative for abdominal pain, blood in stool, constipation and diarrhea.  Endocrine: Negative for polydipsia and polyuria.  Genitourinary:  Negative for dysuria, frequency and urgency.  Musculoskeletal:  Negative for arthralgias, back pain and myalgias.  Skin:  Negative for pallor and rash.  Allergic/Immunologic: Negative for environmental allergies.  Neurological:  Negative for dizziness, syncope and headaches.  Hematological:  Negative for adenopathy. Does not bruise/bleed easily.  Psychiatric/Behavioral:  Negative for decreased concentration and dysphoric mood. The patient is not nervous/anxious.        Objective:   Physical Exam Constitutional:      General: She is not in acute distress.    Appearance: Normal appearance. She is well-developed. She is obese. She is not ill-appearing or diaphoretic.  HENT:  Head: Normocephalic and atraumatic.     Right Ear: Tympanic membrane, ear canal and external ear normal.     Left Ear: Tympanic membrane, ear canal and external ear normal.     Nose: Nose normal. No congestion.     Mouth/Throat:     Mouth: Mucous membranes are moist.     Pharynx: Oropharynx is clear. No posterior oropharyngeal erythema.  Eyes:     General: No scleral icterus.    Extraocular Movements: Extraocular movements intact.      Conjunctiva/sclera: Conjunctivae normal.     Pupils: Pupils are equal, round, and reactive to light.  Neck:     Thyroid: No thyromegaly.     Vascular: No carotid bruit or JVD.  Cardiovascular:     Rate and Rhythm: Normal rate. Rhythm irregular.     Pulses: Normal pulses.     Heart sounds: Normal heart sounds.     No gallop.  Pulmonary:     Effort: Pulmonary effort is normal. No respiratory distress.     Breath sounds: Normal breath sounds. No wheezing.     Comments: Good air exch Chest:     Chest wall: No tenderness.  Abdominal:     General: Bowel sounds are normal. There is no distension or abdominal bruit.     Palpations: Abdomen is soft. There is no mass.     Tenderness: There is no abdominal tenderness.     Hernia: No hernia is present.  Genitourinary:    Comments: Mastectomy sites are normal /no masses  Musculoskeletal:        General: No tenderness. Normal range of motion.     Cervical back: Normal range of motion and neck supple. No rigidity. No muscular tenderness.     Right lower leg: No edema.     Left lower leg: No edema.     Comments: No kyphosis   Lymphadenopathy:     Cervical: No cervical adenopathy.  Skin:    General: Skin is warm and dry.     Coloration: Skin is not pale.     Findings: No erythema or rash.     Comments: Solar lentigines diffusely   Neurological:     Mental Status: She is alert. Mental status is at baseline.     Cranial Nerves: No cranial nerve deficit.     Motor: No abnormal muscle tone.     Coordination: Coordination normal.     Gait: Gait normal.     Deep Tendon Reflexes: Reflexes are normal and symmetric. Reflexes normal.  Psychiatric:        Mood and Affect: Mood normal.        Cognition and Memory: Cognition and memory normal.           Assessment & Plan:   Problem List Items Addressed This Visit       Cardiovascular and Mediastinum   Atrial fibrillation (HCC) - Primary   Doing well under care of cardiology   Continues metoprolol 25 mg bid and eliquis       Relevant Orders   TSH (Completed)     Other   DNR (do not resuscitate) (Chronic)   Given advance directive packet to update      Vitamin D deficiency   D level today      Relevant Orders   VITAMIN D 25 Hydroxy (Vit-D Deficiency, Fractures) (Completed)   Vitamin B12 deficiency   B12 today       Relevant Orders   Vitamin B12 (Completed)  Prediabetes   A1c today  Lower glycemic diet now       Relevant Orders   Hemoglobin A1c (Completed)   Obesity (BMI 30-39.9)   Discussed how this problem influences overall health and the risks it imposes  Reviewed plan for weight loss with lower calorie diet (via better food choices (lower glycemic and portion control) along with exercise building up to or more than 30 minutes 5 days per week including some aerobic activity and strength training    Diet much improved Encouraged strength building exercise as tol      Myofascial pain   Under rheum care Increased her gabapentin to 600 and 900 pm Occational robaxin Cymbalta daily  Water exercise when able       Hyperlipidemia   Labs today  Repatha 140 mg   Used to take zetia Under care of cardiology  Disc goals for lipids and reasons to control them Rev last labs with pt Rev low sat fat diet in detail       Relevant Orders   Lipid Panel (Completed)   Hx of breast cancer   Double mastectomy  No change on exam

## 2023-04-14 NOTE — Assessment & Plan Note (Signed)
 A1c today  Lower glycemic diet now

## 2023-04-16 ENCOUNTER — Encounter: Payer: Self-pay | Admitting: Family Medicine

## 2023-04-17 ENCOUNTER — Other Ambulatory Visit (INDEPENDENT_AMBULATORY_CARE_PROVIDER_SITE_OTHER)

## 2023-04-17 ENCOUNTER — Telehealth (INDEPENDENT_AMBULATORY_CARE_PROVIDER_SITE_OTHER): Payer: Self-pay | Admitting: Family Medicine

## 2023-04-17 ENCOUNTER — Encounter: Payer: Self-pay | Admitting: Family Medicine

## 2023-04-17 DIAGNOSIS — E7800 Pure hypercholesterolemia, unspecified: Secondary | ICD-10-CM

## 2023-04-17 DIAGNOSIS — Z8673 Personal history of transient ischemic attack (TIA), and cerebral infarction without residual deficits: Secondary | ICD-10-CM | POA: Diagnosis not present

## 2023-04-17 DIAGNOSIS — E038 Other specified hypothyroidism: Secondary | ICD-10-CM | POA: Insufficient documentation

## 2023-04-17 DIAGNOSIS — E559 Vitamin D deficiency, unspecified: Secondary | ICD-10-CM

## 2023-04-17 DIAGNOSIS — R7303 Prediabetes: Secondary | ICD-10-CM

## 2023-04-17 DIAGNOSIS — R7989 Other specified abnormal findings of blood chemistry: Secondary | ICD-10-CM | POA: Insufficient documentation

## 2023-04-17 DIAGNOSIS — E538 Deficiency of other specified B group vitamins: Secondary | ICD-10-CM

## 2023-04-17 DIAGNOSIS — I4821 Permanent atrial fibrillation: Secondary | ICD-10-CM

## 2023-04-17 DIAGNOSIS — E0590 Thyrotoxicosis, unspecified without thyrotoxic crisis or storm: Secondary | ICD-10-CM

## 2023-04-17 LAB — COMPREHENSIVE METABOLIC PANEL
ALT: 7 U/L (ref 0–35)
AST: 11 U/L (ref 0–37)
Albumin: 3.9 g/dL (ref 3.5–5.2)
Alkaline Phosphatase: 97 U/L (ref 39–117)
BUN: 11 mg/dL (ref 6–23)
CO2: 31 meq/L (ref 19–32)
Calcium: 9 mg/dL (ref 8.4–10.5)
Chloride: 103 meq/L (ref 96–112)
Creatinine, Ser: 0.57 mg/dL (ref 0.40–1.20)
GFR: 87.3 mL/min (ref >60.00)
Glucose, Bld: 93 mg/dL (ref 70–99)
Potassium: 4.4 meq/L (ref 3.5–5.1)
Sodium: 140 meq/L (ref 135–145)
Total Bilirubin: 0.3 mg/dL (ref 0.2–1.2)
Total Protein: 6.6 g/dL (ref 6.0–8.3)

## 2023-04-17 LAB — CBC WITH DIFFERENTIAL/PLATELET
Basophils Absolute: 0.1 10*3/uL (ref 0.0–0.1)
Basophils Relative: 0.7 % (ref 0.0–3.0)
Eosinophils Absolute: 0.2 10*3/uL (ref 0.0–0.7)
Eosinophils Relative: 2.6 % (ref 0.0–5.0)
HCT: 41.4 % (ref 36.0–46.0)
Hemoglobin: 13.4 g/dL (ref 12.0–15.0)
Lymphocytes Relative: 21.8 % (ref 12.0–46.0)
Lymphs Abs: 1.9 10*3/uL (ref 0.7–4.0)
MCHC: 32.3 g/dL (ref 30.0–36.0)
MCV: 88.3 fl (ref 78.0–100.0)
Monocytes Absolute: 0.7 10*3/uL (ref 0.1–1.0)
Monocytes Relative: 8.4 % (ref 3.0–12.0)
Neutro Abs: 5.9 10*3/uL (ref 1.4–7.7)
Neutrophils Relative %: 66.5 % (ref 43.0–77.0)
Platelets: 227 10*3/uL (ref 150.0–400.0)
RBC: 4.69 Mil/uL (ref 3.87–5.11)
RDW: 15.2 % (ref 11.5–15.5)
WBC: 8.8 10*3/uL (ref 4.0–10.5)

## 2023-04-17 LAB — TSH: TSH: 0.27 u[IU]/mL — ABNORMAL LOW (ref 0.35–5.50)

## 2023-04-17 LAB — T4, FREE: Free T4: 0.7 ng/dL (ref 0.60–1.60)

## 2023-04-17 NOTE — Telephone Encounter (Signed)
 Lab order

## 2023-04-17 NOTE — Addendum Note (Signed)
 Addended by: Alvina Chou on: 04/17/2023 09:11 AM   Modules accepted: Orders

## 2023-04-17 NOTE — Addendum Note (Signed)
 Addended by: Lovena Neighbours on: 04/17/2023 01:44 PM   Modules accepted: Orders

## 2023-04-18 DIAGNOSIS — E0590 Thyrotoxicosis, unspecified without thyrotoxic crisis or storm: Secondary | ICD-10-CM | POA: Insufficient documentation

## 2023-05-02 DIAGNOSIS — H40003 Preglaucoma, unspecified, bilateral: Secondary | ICD-10-CM | POA: Diagnosis not present

## 2023-05-02 DIAGNOSIS — D3130 Benign neoplasm of unspecified choroid: Secondary | ICD-10-CM | POA: Diagnosis not present

## 2023-05-15 DIAGNOSIS — G5603 Carpal tunnel syndrome, bilateral upper limbs: Secondary | ICD-10-CM | POA: Diagnosis not present

## 2023-05-30 DIAGNOSIS — G5602 Carpal tunnel syndrome, left upper limb: Secondary | ICD-10-CM | POA: Diagnosis not present

## 2023-06-01 DIAGNOSIS — G5601 Carpal tunnel syndrome, right upper limb: Secondary | ICD-10-CM | POA: Diagnosis not present

## 2023-06-01 DIAGNOSIS — G5602 Carpal tunnel syndrome, left upper limb: Secondary | ICD-10-CM | POA: Diagnosis not present

## 2023-06-12 ENCOUNTER — Encounter: Payer: Self-pay | Admitting: Internal Medicine

## 2023-06-12 ENCOUNTER — Ambulatory Visit: Admitting: Internal Medicine

## 2023-06-12 VITALS — BP 120/88 | HR 82 | Temp 97.7°F | Ht 68.0 in | Wt 235.0 lb

## 2023-06-12 DIAGNOSIS — G4733 Obstructive sleep apnea (adult) (pediatric): Secondary | ICD-10-CM | POA: Diagnosis not present

## 2023-06-12 NOTE — Progress Notes (Unsigned)
 @Patient  ID: Lauren [name], female    DOB: Jun 28, 1945, 77 y.o.   MRN: [id number]  SYNOPSIS 78 year old female followed for severe obstructive sleep apnea on CPAP  TEST/EVENTS :  PSG 10/19/2016 showed severe obstructive sleep apnea, AHI 76/hr with SPO2 low 84.5% PREVIOUS OV Patient says she wears her CPAP every single night never misses any nights.  She typically gets in about 8 hours.  Patient says she feels rested with no significant daytime sleepiness.  Feels that she benefits from CPAP.  CPAP download shows 100% compliance with daily average usage at 8.5 hours.  Patient is on auto CPAP 13 to 16 cm H2O.  Daily average pressure at 15.3 cm H2O.  AHI is 1.4 Uses full face mask.      CC Follow up Assessment for OSA  HPI: Diagnosis of severe OSA with AHI of 76 Patient has shown excellent compliance  Patient uses and benefits from therapy Using CPAP nightly and with naps Pressure setting is comfortable and is sleeping well. Less fatigue more energy CPAP 13-16 AHI reduced to 0.8   No exacerbation at this time No evidence of heart failure at this time No evidence or signs of infection at this time No respiratory distress No fevers, chills, nausea, vomiting, diarrhea No evidence of lower extremity edema No evidence hemoptysis     Allergies  Allergen Reactions   Crestor  [Rosuvastatin ]     myalgias   Lipitor [Atorvastatin]     myalgias   Statins     joint pain   Wasp Venom Swelling    Immunization History  Administered Date(s) Administered   HPV Bivalent 11/10/2016   Influenza Inj Mdck Quad Pf 11/25/2021   Influenza Split 11/26/2010   Influenza Whole 12/06/2009   Influenza, High Dose Seasonal PF 12/31/2019, 11/02/2020   Influenza, Seasonal, Injecte, Preservative Fre 10/23/2015   Influenza,inj,Quad PF,6+ Mos 11/26/2013, 11/10/2014, 11/10/2016, 11/22/2017, 10/30/2018   Influenza-Unspecified 11/26/2013, 11/10/2014, 11/10/2016, 11/22/2017, 10/30/2018   PFIZER(Purple  Top)SARS-COV-2 Vaccination 02/28/2019, 03/21/2019, 12/27/2019   Pneumococcal Conjugate-13 12/24/2014, 05/31/2016   Pneumococcal Polysaccharide-23 01/19/2010, 01/08/2018   Td 10/13/2006   Zoster Recombinant(Shingrix) 05/31/2016, 08/23/2016   Zoster, Live 02/07/2005    Past Medical History:  Diagnosis Date   Breast cancer (HCC) 2002   DCIS; BRCA 1 pos HER2 pos, ER2 pos   Cataract Removed 2021   DDD (degenerative disc disease)    DNR (do not resuscitate) 06/13/2020   GERD (gastroesophageal reflux disease)    History of echocardiogram    a. 06/2020 Echo: EF 55-60%, no rwma, nl RV size/fxn. Mildly dil LA.   HLD (hyperlipidemia)    statin intolerant   Hyperglycemia 10/2007   A1C elvated to 6.2   Neuromuscular disorder (HCC) Carpal Tunnel L and R   Obstructive sleep apnea    Osteoarthritis    Osteopenia 2007   ? from xray with nl dexa   Oxygen deficiency 10/18   Persistent atrial fibrillation (HCC)    a. Dx 06/2020 in setting of stroke. CHA2DS2VASc = 5-->Eliquis ; b. 08/2020 Successful DCCV; c. 09/2020 Office f/u- pt in rate-controlled Afib.   Reactive depression (situational)    very well controlled, and now takes prozac  for sleep   Sjogren's syndrome [facility])    Sleep apnea 10/18   Stroke (HCC) 06/2020   Urine incontinence    Vitamin D  deficiency     Tobacco History: Social History   Tobacco Use  Smoking Status Never  Smokeless Tobacco Never   Counseling given: Not Answered   Outpatient  Medications Prior to Visit  Medication Sig Dispense Refill   apixaban  (ELIQUIS ) 5 MG TABS tablet Take 1 tablet (5 mg total) by mouth 2 (two) times daily. 180 tablet 3   DULoxetine  (CYMBALTA ) 30 MG capsule Take 1 capsule (30 mg total) by mouth daily. 90 capsule 3   Evolocumab  (REPATHA  SURECLICK) 140 MG/ML SOAJ INJECT ONE DOSE SUBCUTANEOUSLY EVERY 14 DAYS 6 mL 3   gabapentin  (NEURONTIN ) 300 MG capsule Take 2 by mouth in am and 3 in pm 450 capsule 2   methocarbamol  (ROBAXIN ) 500 MG tablet Take 1  tablet (500 mg total) by mouth 2 (two) times daily as needed for muscle spasms. Cauttion of sedation 90 tablet 1   metoprolol  tartrate (LOPRESSOR ) 25 MG tablet Take 1 tablet (25 mg total) by mouth 2 (two) times daily. 180 tablet 3   No facility-administered medications prior to visit.   BP 120/88 (BP Location: Right Arm, Patient Position: Sitting, Cuff Size: Large)   Pulse 82   Temp 97.7 F (36.5 C) (Oral)   Ht 5\' 8"  (1.727 m)   Wt 235 lb (106.6 kg)   SpO2 95%   BMI 35.73 kg/m       Review of Systems: Gen:  Denies  fever, sweats, chills weight loss  HEENT: Denies blurred vision, double vision, ear pain, eye pain, hearing loss, nose bleeds, sore throat Cardiac:  No dizziness, chest pain or heaviness, chest tightness,edema, No JVD Resp:   No cough, -sputum production, -shortness of breath,-wheezing, -hemoptysis,  Other:  All other systems negative   Physical Examination:   General Appearance: No distress  EYES PERRLA, EOM intact.   NECK Supple, No JVD Pulmonary: normal breath sounds, No wheezing.  CardiovascularNormal S1,S2.  No m/r/g.   Abdomen: Benign, Soft, non-tender. Neurology UE/LE 5/5 strength, no focal deficits Ext pulses intact, cap refill intact ALL OTHER ROS ARE NEGATIVE   CBC    Component Value Date/Time   WBC 8.8 04/17/2023 1333   RBC 4.69 04/17/2023 1333   HGB 13.4 04/17/2023 1333   HGB 14.0 03/05/2021 1020   HGB 14.7 10/31/2012 0821   HCT 41.4 04/17/2023 1333   HCT 43.3 03/05/2021 1020   HCT 43.8 10/31/2012 0821   PLT 227.0 04/17/2023 1333   PLT 265 03/05/2021 1020   MCV 88.3 04/17/2023 1333   MCV 86 03/05/2021 1020   MCV 87.1 10/31/2012 0821   MCH 27.9 03/05/2021 1020   MCH 27.7 06/15/2020 0722   MCHC 32.3 04/17/2023 1333   RDW 15.2 04/17/2023 1333   RDW 14.0 03/05/2021 1020   RDW 14.2 10/31/2012 0821   LYMPHSABS 1.9 04/17/2023 1333   LYMPHSABS 1.3 10/31/2012 0821   MONOABS 0.7 04/17/2023 1333   MONOABS 0.6 10/31/2012 0821   EOSABS 0.2  04/17/2023 1333   EOSABS 0.2 10/31/2012 0821   EOSABS 0.2 11/09/2007 1054   BASOSABS 0.1 04/17/2023 1333   BASOSABS 0.0 10/31/2012 0821      Latest Ref Rng & Units 04/17/2023    1:33 PM 03/17/2022   10:41 AM 03/05/2021   10:20 AM  BMP  Glucose 70 - 99 mg/dL 93  161  096   BUN 6 - 23 mg/dL 11  13  9    Creatinine 0.40 - 1.20 mg/dL 0.45  4.09  8.11   BUN/Creat Ratio 12 - 28   17   Sodium 135 - 145 mEq/L 140  138  137   Potassium 3.5 - 5.1 mEq/L 4.4  4.5  4.2   Chloride  96 - 112 mEq/L 103  100  101   CO2 19 - 32 mEq/L 31  31  22    Calcium  8.4 - 10.5 mg/dL 9.0  9.1  7.5        Assessment & Plan:  78 year old pleasant white female seen today for follow-up assessment for severe obstructive sleep apnea with AHI of 76   Assessment of OSA Previous AHI 76 Continue CPAP as prescribed  Excellent compliance report Reviewed compliance report in detail with patient Patient definitely benefits the use of CPAP therapy as prescribed Using CPAP nightly and with naps Pressure setting is comfortable and is sleeping well. CPAP prescription 13-16 AHI reduced to 0.8  No evidence of acute heart failure at this time No respiratory distress No fevers, chills, nausea, vomiting, diarrhea No evidence hemoptysis  Patient Instructions Continue to use CPAP every night, minimum of 4-6 hours a night.  Change equipment every 30 days or as directed by DME.  Wash your tubing with warm soap and water daily, hang to dry. Wash humidifier portion weekly. Use bottled, distilled water and change daily   Be aware of reduced alertness and do not drive or operate heavy machinery if experiencing this or drowsiness.  Exercise encouraged, as tolerated. Encouraged proper weight management.  Important to get eight or more hours of sleep  Limiting the use of the computer and television before bedtime.  Decrease naps during the day, so night time sleep will become enhanced.  Limit caffeine, and sleep deprivation.   HTN, stroke, uncontrolled diabetes and heart failure are potential risk factors.  Risk of untreated sleep apnea including cardiac arrhthymias, stroke, DM, pulm HTN.   Deconditioned state -Recommend increased daily activity and exercise   Follow Up cardiology AFib on Oral AC  Follow up Neurology S/p CVA  MEDICATION ADJUSTMENTS/LABS AND TESTS ORDERED: Excellent job continue CPAP as prescribed Avoid Allergens and Irritants Avoid secondhand smoke Avoid SICK contacts Recommend  Masking  when appropriate Recommend Keep up-to-date with vaccinations    CURRENT MEDICATIONS REVIEWED AT LENGTH WITH PATIENT TODAY   Patient  satisfied with Plan of action and management. All questions answered  Follow-up in 1 year  Total Time Spent  41 mins   Reis Pienta Nestora Baptise, M.D.  [facility]   Medical Director [facility] Medical Director [facility] [address] Cardio-Pulmonary Department

## 2023-06-12 NOTE — Patient Instructions (Signed)

## 2023-07-21 ENCOUNTER — Ambulatory Visit: Payer: Self-pay

## 2023-07-21 ENCOUNTER — Encounter: Payer: Self-pay | Admitting: Emergency Medicine

## 2023-07-21 ENCOUNTER — Ambulatory Visit
Admission: EM | Admit: 2023-07-21 | Discharge: 2023-07-21 | Disposition: A | Discharge status: Home / Self Care | Attending: Emergency Medicine | Admitting: Emergency Medicine

## 2023-07-21 DIAGNOSIS — Z23 Encounter for immunization: Secondary | ICD-10-CM

## 2023-07-21 DIAGNOSIS — L089 Local infection of the skin and subcutaneous tissue, unspecified: Secondary | ICD-10-CM | POA: Diagnosis not present

## 2023-07-21 DIAGNOSIS — T148XXA Other injury of unspecified body region, initial encounter: Secondary | ICD-10-CM

## 2023-07-21 DIAGNOSIS — L03119 Cellulitis of unspecified part of limb: Secondary | ICD-10-CM | POA: Diagnosis not present

## 2023-07-21 MED ORDER — TETANUS-DIPHTH-ACELL PERTUSSIS 5-2.5-18.5 LF-MCG/0.5 IM SUSY
0.5000 mL | PREFILLED_SYRINGE | Freq: Once | INTRAMUSCULAR | Status: AC
  Administered 2023-07-21: 0.5 mL via INTRAMUSCULAR

## 2023-07-21 MED ORDER — DOXYCYCLINE HYCLATE 100 MG PO CAPS: 100.0000 mg | ORAL_CAPSULE | Freq: Two times a day (BID) | ORAL | 0 refills | Status: AC

## 2023-07-21 NOTE — ED Provider Notes (Signed)
 Lauren [name]    CSN: [id number] Arrival date & time: 07/21/23  1104      History   Chief Complaint Chief Complaint  Patient presents with   Wound Check    HPI Lauren [name] is a 78 y.o. female.  Patient presents with abrasions on both lower legs which occurred on 06/17/2023 when she accidentally scraped her legs on the edge of the pool while trying to get out.  She has been treating the abrasions with Vaseline and Aquaphor.  She took Tylenol  at 0200 this morning and hydrocodone last night.  No fever, chills, numbness, weakness.  Last tetanus 2008.  The history is provided by the patient and medical records.    Past Medical History:  Diagnosis Date   Breast cancer (HCC) 2002   DCIS; BRCA 1 pos HER2 pos, ER2 pos   Cataract Removed 2021   DDD (degenerative disc disease)    DNR (do not resuscitate) 06/13/2020   GERD (gastroesophageal reflux disease)    History of echocardiogram    a. 06/2020 Echo: EF 55-60%, no rwma, nl RV size/fxn. Mildly dil LA.   HLD (hyperlipidemia)    statin intolerant   Hyperglycemia 10/2007   A1C elvated to 6.2   Neuromuscular disorder (HCC) Carpal Tunnel L and R   Obstructive sleep apnea    Osteoarthritis    Osteopenia 2007   ? from xray with nl dexa   Oxygen deficiency 10/18   Persistent atrial fibrillation (HCC)    a. Dx 06/2020 in setting of stroke. CHA2DS2VASc = 5-->Eliquis ; b. 08/2020 Successful DCCV; c. 09/2020 Office f/u- pt in rate-controlled Afib.   Reactive depression (situational)    very well controlled, and now takes prozac  for sleep   Sjogren's syndrome (HCC)    Sleep apnea 10/18   Stroke (HCC) 06/2020   Urine incontinence    Vitamin D  deficiency     Patient Active Problem List   Diagnosis Date Noted   Subclinical hyperthyroidism 04/18/2023   Abnormal TSH 04/17/2023   IBS (irritable bowel syndrome) 05/24/2021   Atrial fibrillation (HCC) 09/29/2020   Myofascial pain 09/29/2020   History of CVA (cerebrovascular accident)  06/13/2020   DNR (do not resuscitate) 06/13/2020   Dermatochalasis of both upper eyelids 07/15/2019   Ptosis of both upper eyelids 07/15/2019   Carpal tunnel syndrome 01/25/2019   Age-related nuclear cataract, bilateral 06/19/2018   Vitamin B12 deficiency 03/27/2018   Sjogren's disease (HCC) 01/15/2018   Osteoarthritis 01/15/2018   Obesity (BMI 30-39.9) 01/15/2018   H/O herpes zoster 11/22/2017   Estrogen deficiency 01/09/2017   Routine general medical examination at a health care facility 01/01/2017   OSA (obstructive sleep apnea) 09/18/2016   Bee sting allergy 06/10/2014   Encounter for Medicare annual wellness exam 12/03/2013   History of shingles 11/27/2013   Hx of breast cancer 08/22/2011   Encounter for gynecological examination 02/14/2011   Bilateral dry eyes 01/20/2011   Dry eye syndrome of bilateral lacrimal glands 01/20/2011   Vitamin D  deficiency 04/22/2008   Hyperlipidemia 04/22/2008   GERD 04/22/2008   Prediabetes 04/22/2008    Past Surgical History:  Procedure Laterality Date   BILATERAL OOPHORECTOMY  03/2003   BREAST SURGERY  Bilateral breast removed 2001   CARDIOVERSION N/A 08/24/2020   Procedure: CARDIOVERSION;  Surgeon: Wenona Hamilton, MD;  Location: [facility] ORS;  Service: Cardiovascular;  Laterality: N/A;   CATARACT EXTRACTION, BILATERAL     10/2019, 12/2019   COLONOSCOPY  06/2005   normal; recheck 3  years   EYE SURGERY  cataracts 2021   JOINT REPLACEMENT  Bilateral knee 2006 hip 2010   LUMBAR DISC SURGERY  1991   Ruptured disk L5   MASTECTOMY  2001   bilateral   TONSILLECTOMY  1974   TOTAL HIP ARTHROPLASTY  2011   TOTAL KNEE ARTHROPLASTY  2006   bilateral   TUBAL LIGATION  1982    OB History   No obstetric history on file.      Home Medications    Prior to Admission medications   Medication Sig Start Date End Date Taking? Authorizing Provider  apixaban  (ELIQUIS ) 5 MG TABS tablet Take 1 tablet (5 mg total) by mouth 2 (two) times daily.  03/30/23   Furth, Cadence H, PA-C  doxycycline  (VIBRAMYCIN ) 100 MG capsule Take 1 capsule (100 mg total) by mouth 2 (two) times daily for 7 days. 07/21/23 07/28/23 Yes Wellington Half, NP  DULoxetine  (CYMBALTA ) 30 MG capsule Take 1 capsule (30 mg total) by mouth daily. 04/14/23   Tower, Manley [employer]eds, MD  Evolocumab  (REPATHA  SURECLICK) 140 MG/ML SOAJ INJECT ONE DOSE SUBCUTANEOUSLY EVERY 14 DAYS 03/30/23   Furth, Cadence H, PA-C  gabapentin  (NEURONTIN ) 300 MG capsule Take 2 by mouth in am and 3 in pm 04/14/23   Tower, Manley Seeds, MD  methocarbamol  (ROBAXIN ) 500 MG tablet Take 1 tablet (500 mg total) by mouth 2 (two) times daily as needed for muscle spasms. Cauttion of sedation 04/14/23   Tower, Manley Seeds, MD  metoprolol  tartrate (LOPRESSOR ) 25 MG tablet Take 1 tablet (25 mg total) by mouth 2 (two) times daily. 03/30/23   Furth, Cadence H, PA-C    Family History Family History  Problem Relation Age of Onset   Arthritis Other        family hx   Breast cancer Other        1st degree relative <50   Diabetes Other        1st degree relative   Hyperlipidemia Other        family hx   Hypertension Other        family hx   Ovarian cancer Other        family hx   Prostate cancer Other        1st degree relative <50   Dementia Mother    Cancer Mother    Hearing loss Mother    Psoriasis Brother    Cancer Brother    Obesity Brother    Other Daughter        BRCA gene   ADD / ADHD Daughter    Cancer Daughter    Brain cancer Brother    Cancer Brother    Stroke Maternal Grandmother    Heart disease Maternal Grandmother    Arthritis Father    Cancer Father    Hearing loss Father    Alcohol abuse Maternal Grandfather     Social History Social History   Tobacco Use   Smoking status: Never   Smokeless tobacco: Never  Vaping Use   Vaping status: Never Used  Substance Use Topics   Alcohol use: Not Currently    Comment: rare, none in 2 years   Drug use: Never     Allergies   Crestor  [rosuvastatin ],  Lipitor [atorvastatin], Statins, and Wasp venom   Review of Systems Review of Systems  Constitutional:  Negative for chills and fever.  Musculoskeletal:  Negative for arthralgias, gait problem and joint swelling.  Skin:  Positive for  color change and wound.  Neurological:  Negative for weakness and numbness.     Physical Exam Triage Vital Signs ED Triage Vitals  Encounter Vitals Group     BP 07/21/23 1116 123/81     Girls Systolic BP Percentile --      Girls Diastolic BP Percentile --      Boys Systolic BP Percentile --      Boys Diastolic BP Percentile --      Pulse Rate 07/21/23 1116 96     Resp 07/21/23 1116 18     Temp 07/21/23 1116 98 F (36.7 C)     Temp src --      SpO2 07/21/23 1116 98 %     Weight --      Height --      Head Circumference --      Peak Flow --      Pain Score 07/21/23 1119 8     Pain Loc --      Pain Education --      Exclude from Growth Chart --    No data found.  Updated Vital Signs BP 123/81   Pulse 96   Temp 98 F (36.7 C)   Resp 18   SpO2 98%   Visual Acuity Right Eye Distance:   Left Eye Distance:   Bilateral Distance:    Right Eye Near:   Left Eye Near:    Bilateral Near:     Physical Exam Constitutional:      General: She is not in acute distress. HENT:     Mouth/Throat:     Mouth: Mucous membranes are moist.   Cardiovascular:     Rate and Rhythm: Normal rate and regular rhythm.  Pulmonary:     Effort: Pulmonary effort is normal. No respiratory distress.   Musculoskeletal:        General: No swelling or deformity. Normal range of motion.   Skin:    General: Skin is warm and dry.     Capillary Refill: Capillary refill takes less than 2 seconds.     Findings: Erythema and lesion present.     Comments: Abrasions on both lower legs with surrounding erythema.  Abrasion on left lower leg has some purulent crusting.   Neurological:     General: No focal deficit present.     Mental Status: She is alert.      Sensory: No sensory deficit.     Motor: No weakness.     Gait: Gait normal.      UC Treatments / Results  Labs (all labs ordered are listed, but only abnormal results are displayed) Labs Reviewed - No data to display  EKG   Radiology No results found.  Procedures Procedures (including critical care time)  Medications Ordered in UC Medications  Tdap (BOOSTRIX) injection 0.5 mL (has no administration in time range)    Initial Impression / Assessment and Plan / UC Course  I have reviewed the triage vital signs and the nursing notes.  Pertinent labs & imaging results that were available during my care of the patient were reviewed by me and considered in my medical decision making (see chart for details).    Infected wounds and cellulitis of lower extremities.  Afebrile and vital signs are stable.  Treating with doxycycline .  Wound care instructions and signs of worsening infection discussed.  Tylenol  as needed.  Instructed patient to follow-up with her PCP on Monday.  ED precautions given.  Education provided on  cellulitis and infected wounds.  She agrees to plan of care.   Final Clinical Impressions(s) / UC Diagnoses   Final diagnoses:  Cellulitis of lower extremity, unspecified laterality  Infected wound     Discharge Instructions      Follow up with your primary care provider on Monday.  Go to the emergency department if you have worsening symptoms.    Take the doxycycline  as directed.        ED Prescriptions     Medication Sig Dispense Auth. Provider   doxycycline  (VIBRAMYCIN ) 100 MG capsule Take 1 capsule (100 mg total) by mouth 2 (two) times daily for 7 days. 14 capsule Wellington Half, NP      I have reviewed the PDMP during this encounter.   Wellington Half, NP 07/21/23 1150

## 2023-07-21 NOTE — ED Triage Notes (Signed)
 Patient reports climbing up a ladder at water aerobics and couldn't make up ladder on Tuesday morning. Coming down from ladder scraped bilateral lower legs on concrete.  Rates pain 8/10. Reports he had 1 Hydrocodone-Acetaminophen  5-325 mg and she took that 7 pm last night. Patient reports she took Tylenol  this morning at 2 am.

## 2023-07-21 NOTE — Telephone Encounter (Signed)
 FYI Only or Action Required?: FYI only for provider  Patient was last seen in primary care on 04/14/2023 by Clemens Curt, MD. Called Nurse Triage reporting Possible Cellulitis and Abrasion. Symptoms began several days ago. Interventions attempted: OTC medications: tylenol . Symptoms are: gradually worsening.  Triage Disposition: See Physician Within 24 Hours  Patient/caregiver understands and will follow disposition?: Yes                 Copied from CRM [id number]. Topic: Clinical - Red Word Triage >> Jul 21, 2023  8:28 AM Lauren [name] wrote: [facility] that prompted transfer to Nurse Triage: Injury to both lower legs. Patient was trying to get out of a pool and fell on her shins, scraped legs. Patient is thinking cellulitis is setting in, also experiencing severe pain. Reason for Disposition  [1] Looks infected (spreading redness, pus) AND [2] no fever  Answer Assessment - Initial Assessment Questions 1. APPEARANCE of INJURY: What does the injury look like?      The skin is red, a little purple, especially my L leg, hx of cellulitis to both legs 2. SIZE: How large is the scrape?      One is 2 inches long, an inch wide; the other is 3-4 inches long and 1/4 inch wide; skin totally came off 3. BLEEDING: Is it bleeding now? If Yes, ask: Is it difficult to stop?      Denies bleeding, endorses some drainage 4. LOCATION: Where is the injury located?      Bilateral legs 5. ONSET: How long ago did the injury occur?      Pt was trying to exit a pool 6. MECHANISM: Tell me how it happened.      Occurred on Tuesday  Pain -- Tylenol  doesn't touch it; 8/10  Discoloration   Little bit of swelling  A little warm/hot to the touch  Protocols used: Scrapes-A-AH

## 2023-07-21 NOTE — Discharge Instructions (Addendum)
 Follow up with your primary care provider on Monday.  Go to the emergency department if you have worsening symptoms.    Take the doxycycline  as directed.

## 2023-07-24 ENCOUNTER — Ambulatory Visit (INDEPENDENT_AMBULATORY_CARE_PROVIDER_SITE_OTHER): Admitting: Family Medicine

## 2023-07-24 VITALS — BP 128/72 | HR 108 | Temp 97.7°F | Ht 68.0 in | Wt 233.5 lb

## 2023-07-24 DIAGNOSIS — L03119 Cellulitis of unspecified part of limb: Secondary | ICD-10-CM | POA: Diagnosis not present

## 2023-07-24 MED ORDER — SULFAMETHOXAZOLE-TRIMETHOPRIM 800-160 MG PO TABS: 1.0000 | ORAL_TABLET | Freq: Two times a day (BID) | ORAL | 0 refills | Status: DC

## 2023-07-24 MED ORDER — TRAMADOL HCL 50 MG PO TABS: 25.0000 mg | ORAL_TABLET | Freq: Three times a day (TID) | ORAL | 0 refills | Status: DC | PRN

## 2023-07-24 NOTE — Patient Instructions (Signed)
 Keep wounds clean with soap and water  Cover lightly   Use aquaphor as well   Take the doxycycline   Take the bactrim  ds   If your legs get more red/swollen or painful let us  know  Elevate them when you can   Take tramadol only for severe pain- caution of sedation and falls and dizziness  Follow up later this week for a re check

## 2023-07-24 NOTE — Assessment & Plan Note (Addendum)
 Bilateral-occurred in water when slipping in pool and scraping legs  Seen in UC/ notes reviewed in detail  Was treated with doxycycline   Now inferior right wound looks more red  No drainage to test for mrsa  Will add bactrim  ds and follow up later this week   Legs re dressed with telfa and paper tape    Call back and Er precautions noted in detail today   Encouraged to elevate feet  Encouraged to to continue non stick loose cover with paper tape  Will change neosporin to aquaphor in case this irritates   Tramadol for pain (only if severe)-with conversation about risk of falls/dizziness and sedation

## 2023-07-24 NOTE — Progress Notes (Signed)
 Subjective:    Patient ID: Lauren [name], female    DOB: 05-Jul-1945, 78 y.o.   MRN: [id number]  HPI  Wt Readings from Last 3 Encounters:  07/24/23 233 lb 8 oz (105.9 kg)  06/12/23 235 lb (106.6 kg)  04/14/23 230 lb 6 oz (104.5 kg)   35.50 kg/m  Vitals:   07/24/23 0806  BP: 128/72  Pulse: (!) 108  Temp: 97.7 F (36.5 C)  SpO2: 98%    Pt presents for follow up of UC visit in 6/13 for infected wounds on LEs caused by abrasions (from getting out of a pool)  Treated with doxycycline   Tdap was updated   Thinks she is improving but a lot of discomfort       Takes eliquis  ffor a fit  On metoprolol  25 mg bid    Lab Results  Component Value Date   NA 140 04/17/2023   K 4.4 04/17/2023   CO2 31 04/17/2023   GLUCOSE 93 04/17/2023   BUN 11 04/17/2023   CREATININE 0.57 04/17/2023   CALCIUM  9.0 04/17/2023   GFR 87.30 04/17/2023   EGFR 96 03/05/2021   GFRNONAA >60 06/15/2020      Patient Active Problem List   Diagnosis Date Noted   Subclinical hyperthyroidism 04/18/2023   Abnormal TSH 04/17/2023   Cellulitis of lower extremity 11/15/2021   IBS (irritable bowel syndrome) 05/24/2021   Atrial fibrillation (HCC) 09/29/2020   Myofascial pain 09/29/2020   History of CVA (cerebrovascular accident) 06/13/2020   DNR (do not resuscitate) 06/13/2020   Dermatochalasis of both upper eyelids 07/15/2019   Ptosis of both upper eyelids 07/15/2019   Carpal tunnel syndrome 01/25/2019   Age-related nuclear cataract, bilateral 06/19/2018   Vitamin B12 deficiency 03/27/2018   Sjogren's disease (HCC) 01/15/2018   Osteoarthritis 01/15/2018   Obesity (BMI 30-39.9) 01/15/2018   H/O herpes zoster 11/22/2017   Estrogen deficiency 01/09/2017   Routine general medical examination at a health care facility 01/01/2017   OSA (obstructive sleep apnea) 09/18/2016   Bee sting allergy 06/10/2014   Encounter for Medicare annual wellness exam 12/03/2013   History of shingles 11/27/2013   Hx  of breast cancer 08/22/2011   Encounter for gynecological examination 02/14/2011   Bilateral dry eyes 01/20/2011   Dry eye syndrome of bilateral lacrimal glands 01/20/2011   Vitamin D  deficiency 04/22/2008   Hyperlipidemia 04/22/2008   GERD 04/22/2008   Prediabetes 04/22/2008   Past Medical History:  Diagnosis Date   Breast cancer (HCC) 2002   DCIS; BRCA 1 pos HER2 pos, ER2 pos   Cataract Removed 2021   DDD (degenerative disc disease)    DNR (do not resuscitate) 06/13/2020   GERD (gastroesophageal reflux disease)    History of echocardiogram    a. 06/2020 Echo: EF 55-60%, no rwma, nl RV size/fxn. Mildly dil LA.   HLD (hyperlipidemia)    statin intolerant   Hyperglycemia 10/2007   A1C elvated to 6.2   Neuromuscular disorder (HCC) Carpal Tunnel L and R   Obstructive sleep apnea    Osteoarthritis    Osteopenia 2007   ? from xray with nl dexa   Oxygen deficiency 10/18   Persistent atrial fibrillation (HCC)    a. Dx 06/2020 in setting of stroke. CHA2DS2VASc = 5-->Eliquis ; b. 08/2020 Successful DCCV; c. 09/2020 Office f/u- pt in rate-controlled Afib.   Reactive depression (situational)    very well controlled, and now takes prozac  for sleep   Sjogren's syndrome (HCC)    Sleep apnea  10/18   Stroke (HCC) 06/2020   Urine incontinence    Vitamin D  deficiency    Past Surgical History:  Procedure Laterality Date   BILATERAL OOPHORECTOMY  03/2003   BREAST SURGERY  Bilateral breast removed 2001   CARDIOVERSION N/A 08/24/2020   Procedure: CARDIOVERSION;  Surgeon: Wenona Hamilton, MD;  Location: [facility] ORS;  Service: Cardiovascular;  Laterality: N/A;   CATARACT EXTRACTION, BILATERAL     10/2019, 12/2019   COLONOSCOPY  06/2005   normal; recheck 3 years   EYE SURGERY  cataracts 2021   JOINT REPLACEMENT  Bilateral knee 2006 hip 2010   LUMBAR DISC SURGERY  1991   Ruptured disk L5   MASTECTOMY  2001   bilateral   TONSILLECTOMY  1974   TOTAL HIP ARTHROPLASTY  2011   TOTAL KNEE  ARTHROPLASTY  2006   bilateral   TUBAL LIGATION  1982   Social History   Tobacco Use   Smoking status: Never   Smokeless tobacco: Never  Vaping Use   Vaping status: Never Used  Substance Use Topics   Alcohol use: Not Currently    Comment: rare, none in 2 years   Drug use: Never   Family History  Problem Relation Age of Onset   Arthritis Other        family hx   Breast cancer Other        1st degree relative <50   Diabetes Other        1st degree relative   Hyperlipidemia Other        family hx   Hypertension Other        family hx   Ovarian cancer Other        family hx   Prostate cancer Other        1st degree relative <50   Dementia Mother    Cancer Mother    Hearing loss Mother    Psoriasis Brother    Cancer Brother    Obesity Brother    Other Daughter        BRCA gene   ADD / ADHD Daughter    Cancer Daughter    Brain cancer Brother    Cancer Brother    Stroke Maternal Grandmother    Heart disease Maternal Grandmother    Arthritis Father    Cancer Father    Hearing loss Father    Alcohol abuse Maternal Grandfather    Allergies  Allergen Reactions   Crestor  [Rosuvastatin ]     myalgias   Lipitor [Atorvastatin]     myalgias   Statins     joint pain   Wasp Venom Swelling   Current Outpatient Medications on File Prior to Visit  Medication Sig Dispense Refill   apixaban  (ELIQUIS ) 5 MG TABS tablet Take 1 tablet (5 mg total) by mouth 2 (two) times daily. 180 tablet 3   doxycycline  (VIBRAMYCIN ) 100 MG capsule Take 1 capsule (100 mg total) by mouth 2 (two) times daily for 7 days. 14 capsule 0   DULoxetine  (CYMBALTA ) 30 MG capsule Take 1 capsule (30 mg total) by mouth daily. 90 capsule 3   Evolocumab  (REPATHA  SURECLICK) 140 MG/ML SOAJ INJECT ONE DOSE SUBCUTANEOUSLY EVERY 14 DAYS 6 mL 3   gabapentin  (NEURONTIN ) 300 MG capsule Take 2 by mouth in am and 3 in pm 450 capsule 2   methocarbamol  (ROBAXIN ) 500 MG tablet Take 1 tablet (500 mg total) by mouth 2 (two)  times daily as needed for muscle spasms.  Cauttion of sedation 90 tablet 1   metoprolol  tartrate (LOPRESSOR ) 25 MG tablet Take 1 tablet (25 mg total) by mouth 2 (two) times daily. 180 tablet 3   No current facility-administered medications on file prior to visit.    Review of Systems  Constitutional:  Positive for fatigue.  Skin:  Positive for wound. Negative for rash.       Objective:   Physical Exam Constitutional:      General: She is not in acute distress.    Appearance: Normal appearance. She is well-developed. She is obese. She is not ill-appearing or diaphoretic.  HENT:     Head: Normocephalic and atraumatic.   Eyes:     Conjunctiva/sclera: Conjunctivae normal.     Pupils: Pupils are equal, round, and reactive to light.   Neck:     Thyroid : No thyromegaly.     Vascular: No carotid bruit or JVD.   Cardiovascular:     Rate and Rhythm: Normal rate and regular rhythm.     Pulses: Normal pulses.     Heart sounds: Normal heart sounds.     No gallop.  Pulmonary:     Effort: Pulmonary effort is normal. No respiratory distress.     Breath sounds: Normal breath sounds. No wheezing or rales.  Abdominal:     General: There is no distension or abdominal bruit.     Palpations: Abdomen is soft.   Musculoskeletal:     Cervical back: Normal range of motion and neck supple.     Right lower leg: No edema.     Left lower leg: No edema.  Lymphadenopathy:     Cervical: No cervical adenopathy.   Skin:    General: Skin is warm and dry.     Coloration: Skin is not pale.     Findings: No rash.     Comments: Abrasions on bilateral shins Worse on the right , with some swelling and bruising/and more erythema inferiorly   Areas are tender      Neurological:     Mental Status: She is alert.     Coordination: Coordination normal.     Deep Tendon Reflexes: Reflexes are normal and symmetric. Reflexes normal.   Psychiatric:        Mood and Affect: Mood normal.            Assessment & Plan:   Problem List Items Addressed This Visit       Other   Cellulitis of lower extremity - Primary   Bilateral-occurred in water when slipping in pool and scraping legs  Seen in UC/ notes reviewed in detail  Was treated with doxycycline   Now inferior right wound looks more red  No drainage to test for mrsa  Will add bactrim  ds and follow up later this week   Legs re dressed with telfa and paper tape    Call back and Er precautions noted in detail today   Encouraged to elevate feet  Encouraged to to continue non stick loose cover with paper tape  Will change neosporin to aquaphor in case this irritates   Tramadol for pain (only if severe)-with conversation about risk of falls/dizziness and sedation

## 2023-07-25 DIAGNOSIS — H26492 Other secondary cataract, left eye: Secondary | ICD-10-CM | POA: Diagnosis not present

## 2023-07-25 DIAGNOSIS — D3131 Benign neoplasm of right choroid: Secondary | ICD-10-CM | POA: Diagnosis not present

## 2023-07-25 DIAGNOSIS — H26493 Other secondary cataract, bilateral: Secondary | ICD-10-CM | POA: Diagnosis not present

## 2023-07-25 DIAGNOSIS — H02831 Dermatochalasis of right upper eyelid: Secondary | ICD-10-CM | POA: Diagnosis not present

## 2023-07-25 DIAGNOSIS — Z961 Presence of intraocular lens: Secondary | ICD-10-CM | POA: Diagnosis not present

## 2023-07-25 DIAGNOSIS — H40003 Preglaucoma, unspecified, bilateral: Secondary | ICD-10-CM | POA: Diagnosis not present

## 2023-07-25 DIAGNOSIS — H18413 Arcus senilis, bilateral: Secondary | ICD-10-CM | POA: Diagnosis not present

## 2023-07-27 DIAGNOSIS — Z961 Presence of intraocular lens: Secondary | ICD-10-CM | POA: Diagnosis not present

## 2023-07-27 DIAGNOSIS — D3131 Benign neoplasm of right choroid: Secondary | ICD-10-CM | POA: Diagnosis not present

## 2023-07-27 DIAGNOSIS — H04123 Dry eye syndrome of bilateral lacrimal glands: Secondary | ICD-10-CM | POA: Diagnosis not present

## 2023-07-28 ENCOUNTER — Encounter: Payer: Self-pay | Admitting: Family Medicine

## 2023-07-28 ENCOUNTER — Ambulatory Visit (INDEPENDENT_AMBULATORY_CARE_PROVIDER_SITE_OTHER): Admitting: Family Medicine

## 2023-07-28 VITALS — BP 130/78 | HR 101 | Temp 98.5°F | Ht 68.0 in | Wt 235.4 lb

## 2023-07-28 DIAGNOSIS — L03119 Cellulitis of unspecified part of limb: Secondary | ICD-10-CM | POA: Diagnosis not present

## 2023-07-28 NOTE — Progress Notes (Signed)
 Subjective:    Patient ID: Lauren [name], female    DOB: [birth year], 78 y.o.   MRN: [id number]  HPI  Wt Readings from Last 3 Encounters:  07/28/23 235 lb 6 oz (106.8 kg)  07/24/23 233 lb 8 oz (105.9 kg)  06/12/23 235 lb (106.6 kg)   35.79 kg/m  Vitals:   07/28/23 0757  BP: 130/78  Pulse: (!) 101  Temp: 98.5 F (36.9 C)  SpO2: 98%    Pt presents for follow up of cellulitis of legs  Seen on 6/16 for infection of abrasions sustained from injury getting out of a pool At that time right shin abrasion looked more red/puffy towards the bottom   Taking doxycycline  We added bactrim  ds for possible mrsa coverage  Thinks wound is improved quite a bit  No fever no drainage   Still pain  Tried tramadol - did not help pain and she had a bad nightmare     Lab Results  Component Value Date   NA 140 04/17/2023   K 4.4 04/17/2023   CO2 31 04/17/2023   GLUCOSE 93 04/17/2023   BUN 11 04/17/2023   CREATININE 0.57 04/17/2023   CALCIUM  9.0 04/17/2023   GFR 87.30 04/17/2023   EGFR 96 03/05/2021   GFRNONAA >60 06/15/2020       Patient Active Problem List   Diagnosis Date Noted   Subclinical hyperthyroidism 04/18/2023   Abnormal TSH 04/17/2023   Cellulitis of lower extremity 11/15/2021   IBS (irritable bowel syndrome) 05/24/2021   Atrial fibrillation (HCC) 09/29/2020   Myofascial pain 09/29/2020   History of CVA (cerebrovascular accident) 06/13/2020   DNR (do not resuscitate) 06/13/2020   Dermatochalasis of both upper eyelids 07/15/2019   Ptosis of both upper eyelids 07/15/2019   Carpal tunnel syndrome 01/25/2019   Age-related nuclear cataract, bilateral 06/19/2018   Vitamin B12 deficiency 03/27/2018   Sjogren's disease (HCC) 01/15/2018   Osteoarthritis 01/15/2018   Obesity (BMI 30-39.9) 01/15/2018   H/O herpes zoster 11/22/2017   Estrogen deficiency 01/09/2017   Routine general medical examination at a health care facility 01/01/2017   OSA (obstructive sleep  apnea) 09/18/2016   Bee sting allergy 06/10/2014   Encounter for Medicare annual wellness exam 12/03/2013   History of shingles 11/27/2013   Hx of breast cancer 08/22/2011   Encounter for gynecological examination 02/14/2011   Bilateral dry eyes 01/20/2011   Dry eye syndrome of bilateral lacrimal glands 01/20/2011   Vitamin D  deficiency 04/22/2008   Hyperlipidemia 04/22/2008   GERD 04/22/2008   Prediabetes 04/22/2008   Past Medical History:  Diagnosis Date   Breast cancer (HCC) 2002   DCIS; BRCA 1 pos HER2 pos, ER2 pos   Cataract Removed 2021   DDD (degenerative disc disease)    DNR (do not resuscitate) 06/13/2020   GERD (gastroesophageal reflux disease)    History of echocardiogram    a. 06/2020 Echo: EF 55-60%, no rwma, nl RV size/fxn. Mildly dil LA.   HLD (hyperlipidemia)    statin intolerant   Hyperglycemia 10/2007   A1C elvated to 6.2   Neuromuscular disorder (HCC) Carpal Tunnel L and R   Obstructive sleep apnea    Osteoarthritis    Osteopenia 2007   ? from xray with nl dexa   Oxygen deficiency 10/18   Persistent atrial fibrillation (HCC)    a. Dx 06/2020 in setting of stroke. CHA2DS2VASc = 5-->Eliquis ; b. 08/2020 Successful DCCV; c. 09/2020 Office f/u- pt in rate-controlled Afib.   Reactive depression (situational)  very well controlled, and now takes prozac  for sleep   Sjogren's syndrome (HCC)    Sleep apnea 10/18   Stroke (HCC) 06/2020   Urine incontinence    Vitamin D  deficiency    Past Surgical History:  Procedure Laterality Date   BILATERAL OOPHORECTOMY  03/2003   BREAST SURGERY  Bilateral breast removed 2001   CARDIOVERSION N/A 08/24/2020   Procedure: CARDIOVERSION;  Surgeon: Wenona Hamilton, MD;  Location: [facility] ORS;  Service: Cardiovascular;  Laterality: N/A;   CATARACT EXTRACTION, BILATERAL     10/2019, 12/2019   COLONOSCOPY  06/2005   normal; recheck 3 years   EYE SURGERY  cataracts 2021   JOINT REPLACEMENT  Bilateral knee 2006 hip 2010   LUMBAR  DISC SURGERY  1991   Ruptured disk L5   MASTECTOMY  2001   bilateral   TONSILLECTOMY  1974   TOTAL HIP ARTHROPLASTY  2011   TOTAL KNEE ARTHROPLASTY  2006   bilateral   TUBAL LIGATION  1982   Social History   Tobacco Use   Smoking status: Never   Smokeless tobacco: Never  Vaping Use   Vaping status: Never Used  Substance Use Topics   Alcohol use: Not Currently    Comment: rare, none in 2 years   Drug use: Never   Family History  Problem Relation Age of Onset   Arthritis Other        family hx   Breast cancer Other        1st degree relative <50   Diabetes Other        1st degree relative   Hyperlipidemia Other        family hx   Hypertension Other        family hx   Ovarian cancer Other        family hx   Prostate cancer Other        1st degree relative <50   Dementia Mother    Cancer Mother    Hearing loss Mother    Psoriasis Brother    Cancer Brother    Obesity Brother    Other Daughter        BRCA gene   ADD / ADHD Daughter    Cancer Daughter    Brain cancer Brother    Cancer Brother    Stroke Maternal Grandmother    Heart disease Maternal Grandmother    Arthritis Father    Cancer Father    Hearing loss Father    Alcohol abuse Maternal Grandfather    Allergies  Allergen Reactions   Crestor  [Rosuvastatin ]     myalgias   Lipitor [Atorvastatin]     myalgias   Statins     joint pain   Tramadol     Night mares , does not help pain    Wasp Venom Swelling   Current Outpatient Medications on File Prior to Visit  Medication Sig Dispense Refill   apixaban  (ELIQUIS ) 5 MG TABS tablet Take 1 tablet (5 mg total) by mouth 2 (two) times daily. 180 tablet 3   doxycycline  (VIBRAMYCIN ) 100 MG capsule Take 1 capsule (100 mg total) by mouth 2 (two) times daily for 7 days. 14 capsule 0   DULoxetine  (CYMBALTA ) 30 MG capsule Take 1 capsule (30 mg total) by mouth daily. 90 capsule 3   Evolocumab  (REPATHA  SURECLICK) 140 MG/ML SOAJ INJECT ONE DOSE SUBCUTANEOUSLY EVERY  14 DAYS 6 mL 3   gabapentin  (NEURONTIN ) 300 MG capsule Take 2 by  mouth in am and 3 in pm 450 capsule 2   methocarbamol  (ROBAXIN ) 500 MG tablet Take 1 tablet (500 mg total) by mouth 2 (two) times daily as needed for muscle spasms. Cauttion of sedation 90 tablet 1   metoprolol  tartrate (LOPRESSOR ) 25 MG tablet Take 1 tablet (25 mg total) by mouth 2 (two) times daily. 180 tablet 3   prednisoLONE acetate (PRED FORTE) 1 % ophthalmic suspension Place 1 drop into the left eye 4 (four) times daily.     sulfamethoxazole -trimethoprim  (BACTRIM  DS) 800-160 MG tablet Take 1 tablet by mouth 2 (two) times daily. With food 14 tablet 0   No current facility-administered medications on file prior to visit.    Review of Systems  Constitutional:  Negative for fatigue and fever.  Skin:  Positive for color change and wound.       Objective:   Physical Exam Constitutional:      General: She is not in acute distress.    Appearance: Normal appearance. She is obese. She is not ill-appearing.   Cardiovascular:     Rate and Rhythm: Regular rhythm. Tachycardia present.  Pulmonary:     Effort: No respiratory distress.   Skin:    Findings: Bruising present. No erythema.     Comments: Improvement in wounds on shins  Right side- little to no erythema/scab formation of linear wound   Left side-evolution of ecchymosis surrounding linear wound  Less swelling Previous bright erythema at bottom of wound is improved  Scab present  Mild tenderness      Neurological:     Mental Status: She is alert.     Sensory: No sensory deficit.   Psychiatric:        Mood and Affect: Mood normal.           Assessment & Plan:   Problem List Items Addressed This Visit       Other   Cellulitis of lower extremity - Primary   Improved with addition of bactrim  DS (to doxy) Tolerating well  Erythema is improving and wounds are scabbed  Did not tolerate tramadol (in past also intol of vicodin)   Instructed to  finish antibiotics Elevate when able  Warm /cool compresses prn   Disc symptomatic care - see instructions on AVS  Watch for increased redness/swelling/pain /fever or drainage Call back and Er precautions noted in detail today

## 2023-07-28 NOTE — Patient Instructions (Signed)
 I'm glad your wounds are improving   Continue to keep clean with soap and water  Cover when needed   Finish your antibiotics If worsening / redness/swelling /pain - let us  know    Do not submerge until totally healed

## 2023-07-28 NOTE — Assessment & Plan Note (Signed)
 Improved with addition of bactrim  DS (to doxy) Tolerating well  Erythema is improving and wounds are scabbed  Did not tolerate tramadol (in past also intol of vicodin)   Instructed to finish antibiotics Elevate when able  Warm /cool compresses prn   Disc symptomatic care - see instructions on AVS  Watch for increased redness/swelling/pain /fever or drainage Call back and Er precautions noted in detail today

## 2023-07-31 ENCOUNTER — Emergency Department

## 2023-07-31 ENCOUNTER — Encounter: Payer: Self-pay | Admitting: Emergency Medicine

## 2023-07-31 ENCOUNTER — Inpatient Hospital Stay
Admission: EM | Admit: 2023-07-31 | Discharge: 2023-08-06 | DRG: 871 | Disposition: A | Discharge status: Home / Self Care | Attending: Osteopathic Medicine | Admitting: Osteopathic Medicine

## 2023-07-31 DIAGNOSIS — E876 Hypokalemia: Secondary | ICD-10-CM | POA: Diagnosis not present

## 2023-07-31 DIAGNOSIS — M3500 Sicca syndrome, unspecified: Secondary | ICD-10-CM | POA: Diagnosis present

## 2023-07-31 DIAGNOSIS — Z888 Allergy status to other drugs, medicaments and biological substances status: Secondary | ICD-10-CM

## 2023-07-31 DIAGNOSIS — I4819 Other persistent atrial fibrillation: Secondary | ICD-10-CM | POA: Diagnosis not present

## 2023-07-31 DIAGNOSIS — Z7901 Long term (current) use of anticoagulants: Secondary | ICD-10-CM

## 2023-07-31 DIAGNOSIS — L03116 Cellulitis of left lower limb: Secondary | ICD-10-CM | POA: Diagnosis not present

## 2023-07-31 DIAGNOSIS — S80811A Abrasion, right lower leg, initial encounter: Secondary | ICD-10-CM | POA: Diagnosis present

## 2023-07-31 DIAGNOSIS — Z66 Do not resuscitate: Secondary | ICD-10-CM | POA: Diagnosis not present

## 2023-07-31 DIAGNOSIS — E785 Hyperlipidemia, unspecified: Secondary | ICD-10-CM | POA: Diagnosis not present

## 2023-07-31 DIAGNOSIS — E66812 Obesity, class 2: Secondary | ICD-10-CM | POA: Diagnosis present

## 2023-07-31 DIAGNOSIS — R140 Abdominal distension (gaseous): Secondary | ICD-10-CM | POA: Diagnosis not present

## 2023-07-31 DIAGNOSIS — Z853 Personal history of malignant neoplasm of breast: Secondary | ICD-10-CM

## 2023-07-31 DIAGNOSIS — Z6835 Body mass index (BMI) 35.0-35.9, adult: Secondary | ICD-10-CM

## 2023-07-31 DIAGNOSIS — Z96653 Presence of artificial knee joint, bilateral: Secondary | ICD-10-CM | POA: Diagnosis present

## 2023-07-31 DIAGNOSIS — Z9010 Acquired absence of unspecified breast and nipple: Secondary | ICD-10-CM | POA: Diagnosis not present

## 2023-07-31 DIAGNOSIS — R519 Headache, unspecified: Secondary | ICD-10-CM | POA: Diagnosis present

## 2023-07-31 DIAGNOSIS — L03115 Cellulitis of right lower limb: Secondary | ICD-10-CM | POA: Diagnosis not present

## 2023-07-31 DIAGNOSIS — E559 Vitamin D deficiency, unspecified: Secondary | ICD-10-CM | POA: Diagnosis present

## 2023-07-31 DIAGNOSIS — S80812A Abrasion, left lower leg, initial encounter: Secondary | ICD-10-CM | POA: Diagnosis present

## 2023-07-31 DIAGNOSIS — Z822 Family history of deafness and hearing loss: Secondary | ICD-10-CM

## 2023-07-31 DIAGNOSIS — G4733 Obstructive sleep apnea (adult) (pediatric): Secondary | ICD-10-CM | POA: Diagnosis present

## 2023-07-31 DIAGNOSIS — I10 Essential (primary) hypertension: Secondary | ICD-10-CM | POA: Diagnosis not present

## 2023-07-31 DIAGNOSIS — S81802A Unspecified open wound, left lower leg, initial encounter: Secondary | ICD-10-CM

## 2023-07-31 DIAGNOSIS — L0390 Cellulitis, unspecified: Secondary | ICD-10-CM | POA: Diagnosis not present

## 2023-07-31 DIAGNOSIS — I700 Atherosclerosis of aorta: Secondary | ICD-10-CM | POA: Diagnosis not present

## 2023-07-31 DIAGNOSIS — Y95 Nosocomial condition: Secondary | ICD-10-CM | POA: Diagnosis not present

## 2023-07-31 DIAGNOSIS — A419 Sepsis, unspecified organism: Principal | ICD-10-CM | POA: Diagnosis present

## 2023-07-31 DIAGNOSIS — Z8041 Family history of malignant neoplasm of ovary: Secondary | ICD-10-CM

## 2023-07-31 DIAGNOSIS — I959 Hypotension, unspecified: Secondary | ICD-10-CM | POA: Diagnosis not present

## 2023-07-31 DIAGNOSIS — Z1152 Encounter for screening for COVID-19: Secondary | ICD-10-CM

## 2023-07-31 DIAGNOSIS — Z823 Family history of stroke: Secondary | ICD-10-CM

## 2023-07-31 DIAGNOSIS — Z8249 Family history of ischemic heart disease and other diseases of the circulatory system: Secondary | ICD-10-CM | POA: Diagnosis not present

## 2023-07-31 DIAGNOSIS — R6510 Systemic inflammatory response syndrome (SIRS) of non-infectious origin without acute organ dysfunction: Secondary | ICD-10-CM | POA: Diagnosis not present

## 2023-07-31 DIAGNOSIS — L089 Local infection of the skin and subcutaneous tissue, unspecified: Secondary | ICD-10-CM | POA: Diagnosis not present

## 2023-07-31 DIAGNOSIS — Z79899 Other long term (current) drug therapy: Secondary | ICD-10-CM | POA: Diagnosis not present

## 2023-07-31 DIAGNOSIS — Z808 Family history of malignant neoplasm of other organs or systems: Secondary | ICD-10-CM

## 2023-07-31 DIAGNOSIS — W2209XA Striking against other stationary object, initial encounter: Secondary | ICD-10-CM | POA: Diagnosis present

## 2023-07-31 DIAGNOSIS — I482 Chronic atrial fibrillation, unspecified: Secondary | ICD-10-CM | POA: Diagnosis present

## 2023-07-31 DIAGNOSIS — R509 Fever, unspecified: Secondary | ICD-10-CM | POA: Diagnosis not present

## 2023-07-31 DIAGNOSIS — Z833 Family history of diabetes mellitus: Secondary | ICD-10-CM | POA: Diagnosis not present

## 2023-07-31 DIAGNOSIS — Z8673 Personal history of transient ischemic attack (TIA), and cerebral infarction without residual deficits: Secondary | ICD-10-CM

## 2023-07-31 DIAGNOSIS — R918 Other nonspecific abnormal finding of lung field: Secondary | ICD-10-CM | POA: Diagnosis not present

## 2023-07-31 DIAGNOSIS — K219 Gastro-esophageal reflux disease without esophagitis: Secondary | ICD-10-CM | POA: Diagnosis present

## 2023-07-31 DIAGNOSIS — I4891 Unspecified atrial fibrillation: Secondary | ICD-10-CM

## 2023-07-31 DIAGNOSIS — J1289 Other viral pneumonia: Secondary | ICD-10-CM | POA: Diagnosis not present

## 2023-07-31 DIAGNOSIS — R059 Cough, unspecified: Secondary | ICD-10-CM | POA: Diagnosis not present

## 2023-07-31 DIAGNOSIS — Z885 Allergy status to narcotic agent status: Secondary | ICD-10-CM

## 2023-07-31 DIAGNOSIS — R000 Tachycardia, unspecified: Secondary | ICD-10-CM | POA: Diagnosis not present

## 2023-07-31 DIAGNOSIS — Z96649 Presence of unspecified artificial hip joint: Secondary | ICD-10-CM | POA: Diagnosis present

## 2023-07-31 DIAGNOSIS — Z803 Family history of malignant neoplasm of breast: Secondary | ICD-10-CM

## 2023-07-31 DIAGNOSIS — Z8261 Family history of arthritis: Secondary | ICD-10-CM

## 2023-07-31 LAB — LACTIC ACID, PLASMA
Lactic Acid, Venous: 1.3 mmol/L (ref 0.5–1.9)
Lactic Acid, Venous: 0.9 mmol/L (ref 0.5–1.9)

## 2023-07-31 LAB — COMPREHENSIVE METABOLIC PANEL WITH GFR
ALT: 11 U/L (ref 0–44)
AST: 17 U/L (ref 15–41)
Albumin: 3.6 g/dL (ref 3.5–5.0)
Alkaline Phosphatase: 75 U/L (ref 38–126)
Anion gap: 14 (ref 5–15)
BUN: 14 mg/dL (ref 8–23)
CO2: 20 mmol/L — ABNORMAL LOW (ref 22–32)
Calcium: 8.5 mg/dL — ABNORMAL LOW (ref 8.9–10.3)
Chloride: 99 mmol/L (ref 98–111)
Creatinine, Ser: 0.61 mg/dL (ref 0.44–1.00)
GFR, Estimated: >60 mL/min (ref >60)
Glucose, Bld: 132 mg/dL — ABNORMAL HIGH (ref 70–99)
Potassium: 3.7 mmol/L (ref 3.5–5.1)
Sodium: 133 mmol/L — ABNORMAL LOW (ref 135–145)
Total Bilirubin: 0.8 mg/dL (ref 0.0–1.2)
Total Protein: 7.4 g/dL (ref 6.5–8.1)

## 2023-07-31 LAB — URINALYSIS, ROUTINE W REFLEX MICROSCOPIC
Bilirubin Urine: NEGATIVE
Glucose, UA: NEGATIVE mg/dL
Hgb urine dipstick: NEGATIVE
Ketones, ur: 5 mg/dL — AB
Leukocytes,Ua: NEGATIVE
Nitrite: NEGATIVE
Protein, ur: NEGATIVE mg/dL
Specific Gravity, Urine: 1.025 (ref 1.005–1.030)
pH: 5 (ref 5.0–8.0)

## 2023-07-31 LAB — CBC WITH DIFFERENTIAL/PLATELET
Abs Immature Granulocytes: 0.05 10*3/uL (ref 0.00–0.07)
Basophils Absolute: 0 10*3/uL (ref 0.0–0.1)
Basophils Relative: 0 %
Eosinophils Absolute: 0 10*3/uL (ref 0.0–0.5)
Eosinophils Relative: 0 %
HCT: 40.7 % (ref 36.0–46.0)
Hemoglobin: 13.8 g/dL (ref 12.0–15.0)
Immature Granulocytes: 1 %
Lymphocytes Relative: 12 %
Lymphs Abs: 1.2 10*3/uL (ref 0.7–4.0)
MCH: 28.5 pg (ref 26.0–34.0)
MCHC: 33.9 g/dL (ref 30.0–36.0)
MCV: 84.1 fL (ref 80.0–100.0)
Monocytes Absolute: 0.9 10*3/uL (ref 0.1–1.0)
Monocytes Relative: 9 %
Neutro Abs: 7.8 10*3/uL — ABNORMAL HIGH (ref 1.7–7.7)
Neutrophils Relative %: 78 %
Platelets: 206 10*3/uL (ref 150–400)
RBC: 4.84 MIL/uL (ref 3.87–5.11)
RDW: 15.6 % — ABNORMAL HIGH (ref 11.5–15.5)
WBC: 10 10*3/uL (ref 4.0–10.5)
nRBC: 0 % (ref 0.0–0.2)

## 2023-07-31 LAB — PROCALCITONIN: Procalcitonin: <0.1 ng/mL

## 2023-07-31 LAB — PROTIME-INR
INR: 1.2 (ref 0.8–1.2)
Prothrombin Time: 15.8 s — ABNORMAL HIGH (ref 11.4–15.2)

## 2023-07-31 MED ORDER — ONDANSETRON HCL 4 MG PO TABS
4.0000 mg | ORAL_TABLET | Freq: Four times a day (QID) | ORAL | Status: DC | PRN
  Filled 2023-07-31: qty 1

## 2023-07-31 MED ORDER — VANCOMYCIN HCL 1500 MG/300ML IV SOLN
1500.0000 mg | INTRAVENOUS | Status: DC
  Filled 2023-07-31: qty 300

## 2023-07-31 MED ORDER — ACETAMINOPHEN 650 MG RE SUPP: 650.0000 mg | Freq: Four times a day (QID) | RECTAL | Status: DC | PRN

## 2023-07-31 MED ORDER — GUAIFENESIN-DM 100-10 MG/5ML PO SYRP
10.0000 mL | ORAL_SOLUTION | ORAL | Status: DC | PRN
  Administered 2023-08-03 – 2023-08-05 (×5): 10 mL via ORAL
  Filled 2023-07-31 (×5): qty 10

## 2023-07-31 MED ORDER — ACETAMINOPHEN 500 MG PO TABS: 1000.0000 mg | ORAL_TABLET | Freq: Once | ORAL | Status: DC

## 2023-07-31 MED ORDER — SODIUM CHLORIDE 0.9 % IV SOLN
2.0000 g | INTRAVENOUS | Status: DC
  Administered 2023-08-01 – 2023-08-03 (×3): 2 g via INTRAVENOUS
  Filled 2023-07-31 (×3): qty 20

## 2023-07-31 MED ORDER — SODIUM CHLORIDE 0.9 % IV SOLN
2.0000 g | Freq: Once | INTRAVENOUS | Status: AC
  Administered 2023-07-31: 2 g via INTRAVENOUS
  Filled 2023-07-31: qty 12.5

## 2023-07-31 MED ORDER — HYDROCODONE-ACETAMINOPHEN 5-325 MG PO TABS
1.0000 | ORAL_TABLET | ORAL | Status: DC | PRN
  Administered 2023-08-03 – 2023-08-04 (×2): 1 via ORAL
  Filled 2023-07-31 (×2): qty 1

## 2023-07-31 MED ORDER — ACETAMINOPHEN 325 MG PO TABS
650.0000 mg | ORAL_TABLET | Freq: Four times a day (QID) | ORAL | Status: DC | PRN
  Administered 2023-08-01 – 2023-08-06 (×5): 650 mg via ORAL
  Filled 2023-07-31 (×5): qty 2

## 2023-07-31 MED ORDER — VANCOMYCIN HCL 2000 MG/400ML IV SOLN
2000.0000 mg | Freq: Once | INTRAVENOUS | Status: AC
  Administered 2023-07-31: 2000 mg via INTRAVENOUS
  Filled 2023-07-31: qty 400

## 2023-07-31 MED ORDER — DULOXETINE HCL 30 MG PO CPEP
30.0000 mg | ORAL_CAPSULE | Freq: Every day | ORAL | Status: DC
  Administered 2023-08-01 – 2023-08-06 (×6): 30 mg via ORAL
  Filled 2023-07-31 (×6): qty 1

## 2023-07-31 MED ORDER — SODIUM CHLORIDE 0.9 % IV BOLUS
2000.0000 mL | Freq: Once | INTRAVENOUS | Status: AC
  Administered 2023-07-31: 2000 mL via INTRAVENOUS

## 2023-07-31 MED ORDER — APIXABAN 5 MG PO TABS
5.0000 mg | ORAL_TABLET | Freq: Two times a day (BID) | ORAL | Status: DC
  Administered 2023-08-01 – 2023-08-06 (×11): 5 mg via ORAL
  Filled 2023-07-31 (×11): qty 1

## 2023-07-31 MED ORDER — LACTATED RINGERS IV SOLN
150.0000 mL/h | INTRAVENOUS | Status: DC
  Administered 2023-07-31 – 2023-08-01 (×2): 150 mL/h via INTRAVENOUS

## 2023-07-31 MED ORDER — METOPROLOL TARTRATE 25 MG PO TABS
25.0000 mg | ORAL_TABLET | Freq: Two times a day (BID) | ORAL | Status: DC
  Administered 2023-07-31 – 2023-08-06 (×12): 25 mg via ORAL
  Filled 2023-07-31 (×11): qty 1

## 2023-07-31 MED ORDER — ONDANSETRON HCL 4 MG/2ML IJ SOLN
4.0000 mg | Freq: Four times a day (QID) | INTRAMUSCULAR | Status: DC | PRN
  Administered 2023-08-01 (×2): 4 mg via INTRAVENOUS
  Filled 2023-07-31 (×2): qty 2

## 2023-07-31 NOTE — Progress Notes (Signed)
 Pharmacy Antibiotic Note  Lauren [name] is a 78 y.o. female admitted on 07/31/2023 with cellulitis.  Pharmacy has been consulted for Vancomycin dosing.  Plan: Pt given Vancomycin 2000 mg once. Vancomycin 1500 mg IV Q 24 hrs. Goal AUC 400-550. Expected AUC: 530.7 SCr used: 0.8 (6/23 SCr = 0.61), Vd used: 0.5, BMI: 35.7  Pharmacy will continue to follow and will adjust abx dosing whenever warranted.  Temp (24hrs), Avg:100.3 F (37.9 C), Min:99.1 F (37.3 C), Max:101.5 F (38.6 C)   Recent Labs  Lab 07/31/23 1936 07/31/23 2056  WBC 10.0  --   CREATININE 0.61  --   LATICACIDVEN 1.3 0.9    Estimated Creatinine Clearance: 74.2 mL/min (by C-G formula based on SCr of 0.61 mg/dL).    Allergies  Allergen Reactions   Crestor  [Rosuvastatin ]     myalgias   Lipitor [Atorvastatin]     myalgias   Statins     joint pain   Tramadol      Night mares , does not help pain    Wasp Venom Swelling    Antimicrobials this admission: 6/23 Cefepime >> x 1 dose 6/23 Vancomycin >> 6/24 Ceftriaxone >>   Microbiology results: 6/23 BCx: Pending  Thank you for allowing pharmacy to be a part of this patient's care.  Rankin CANDIE Dills, PharmD, [facility] 07/31/2023 9:47 PM

## 2023-07-31 NOTE — Consult Note (Deleted)
 Pharmacy Antibiotic Note  Lauren [name] is a 78 y.o. female admitted on 07/31/2023 with cellulitis.  Pharmacy has been consulted for vancomycin dosing.  Vancomycin 2000 mg IV x 1 given 6/23 @ 2007  Plan: Start vancomycin 1500 mg IV every 24 hours Estimated AUC 530.7, Cmin 11.7 IBW, Scr rounded to 0.8, Vd 0.5 (BMI 35.79) Vancomycin levels at steady state or as clinically indicated Ceftriaxone 2 grams IV every 24 hours per provider Follow renal function and cultures for adjustments     Temp (24hrs), Avg:100.3 F (37.9 C), Min:99.1 F (37.3 C), Max:101.5 F (38.6 C)  Recent Labs  Lab 07/31/23 1936 07/31/23 2056  WBC 10.0  --   CREATININE 0.61  --   LATICACIDVEN 1.3 0.9    Estimated Creatinine Clearance: 74.2 mL/min (by C-G formula based on SCr of 0.61 mg/dL).    Allergies  Allergen Reactions   Crestor  [Rosuvastatin ]     myalgias   Lipitor [Atorvastatin]     myalgias   Statins     joint pain   Tramadol      Night mares , does not help pain    Wasp Venom Swelling    Antimicrobials this admission: vancomycin 6/23 >>  ceftriaxone 6/23 >>   Microbiology results: 6/23 BCx: pending  Thank you for allowing pharmacy to be a part of this patient's care.  Kayla JULIANNA Blew 07/31/2023 9:49 PM

## 2023-07-31 NOTE — Assessment & Plan Note (Signed)
 Heart rate in the 120s, likely driven by infection Continue home rate control agents of metoprolol  for now Might improve with IV fluids for treatment of possible sepsis Continue apixaban 

## 2023-07-31 NOTE — ED Notes (Signed)
 Fall risk downgraded... Pt is ambulatory without assistance and steady gait

## 2023-07-31 NOTE — ED Provider Notes (Signed)
 [facility] Provider Note    Event Date/Time   First MD Initiated Contact with Patient 07/31/23 1916     (approximate)   History   Code Sepsis   HPI  Lauren [name] is a 78 y.o. female who presents to the ED for evaluation of Code Sepsis   Review of PCP visit from 3 days ago.  Started on doxycycline  6/13 from urgent care, added Bactrim  6/16, reportedly improving by 6/20.  Otherwise history of A-fib on Eliquis   Patient presents due to fever of the past day.  Headache and feeling unwell in the setting of the fever.  No head trauma or syncope, vision changes or weakness to the extremities.  Does report a cough alongside the fever  Reports finishing both the Bactrim  and doxycycline  and the leg looks somewhat better   Physical Exam   Triage Vital Signs: ED Triage Vitals [07/31/23 1918]  Encounter Vitals Group     BP (!) 136/90     Girls Systolic BP Percentile      Girls Diastolic BP Percentile      Boys Systolic BP Percentile      Boys Diastolic BP Percentile      Pulse Rate (!) 121     Resp (!) 23     Temp (!) 101.5 F (38.6 C)     Temp src      SpO2 96 %     Weight      Height      Head Circumference      Peak Flow      Pain Score 5     Pain Loc      Pain Education      Exclude from Growth Chart     Most recent vital signs: Vitals:   07/31/23 1918  BP: (!) 136/90  Pulse: (!) 121  Resp: (!) 23  Temp: (!) 101.5 F (38.6 C)  SpO2: 96%    General: Awake, no distress.  Warm to the touch CV:  Good peripheral perfusion.  A-fib with RVR on the monitor Resp:  Normal effort.  Abd:  No distention.  Soft MSK:  As pictured below, area of redness surrounding a scabbing area to the right shin consistent with cellulitis.  No purulence Neuro:  No focal deficits appreciated. Other:     ED Results / Procedures / Treatments   Labs (all labs ordered are listed, but only abnormal results are displayed) Labs Reviewed  COMPREHENSIVE METABOLIC  PANEL WITH GFR - Abnormal; Notable for the following components:      Result Value   Sodium 133 (*)    CO2 20 (*)    Glucose, Bld 132 (*)    Calcium  8.5 (*)    All other components within normal limits  CBC WITH DIFFERENTIAL/PLATELET - Abnormal; Notable for the following components:   RDW 15.6 (*)    Neutro Abs 7.8 (*)    All other components within normal limits  PROTIME-INR - Abnormal; Notable for the following components:   Prothrombin Time 15.8 (*)    All other components within normal limits  URINALYSIS, ROUTINE W REFLEX MICROSCOPIC - Abnormal; Notable for the following components:   Color, Urine YELLOW (*)    APPearance CLEAR (*)    Ketones, ur 5 (*)    All other components within normal limits  CULTURE, BLOOD (ROUTINE X 2)  CULTURE, BLOOD (ROUTINE X 2)  LACTIC ACID, PLASMA  PROCALCITONIN  LACTIC ACID, PLASMA    EKG  A-fib with RVR 3 to 125 bpm.  No STEMI  RADIOLOGY CXR interpreted by me without evidence of acute cardiopulmonary pathology.  Official radiology report(s): DG Chest Port 1 View Result Date: 07/31/2023 CLINICAL DATA:  Fever. EXAM: PORTABLE CHEST 1 VIEW COMPARISON:  08/22/2011. FINDINGS: The heart size and mediastinal contours are within normal limits. No pneumothorax or pleural effusion. Stable medial right basilar density likely represents pericardial fat and is unchanged since 2013. Lungs are otherwise clear. Aorta is calcified. The visualized skeletal structures are unremarkable. IMPRESSION: No active disease. Electronically Signed   By: Fonda Field M.D.   On: 07/31/2023 20:05    PROCEDURES and INTERVENTIONS:  .1-3 Lead EKG Interpretation  Performed by: Claudene Rover, MD Authorized by: Claudene Rover, MD     Interpretation: abnormal     ECG rate:  121   ECG rate assessment: tachycardic     Rhythm: atrial fibrillation     Ectopy: none     Conduction: normal   .Critical Care  Performed by: Claudene Rover, MD Authorized by: Claudene Rover, MD    Critical care provider statement:    Critical care time (minutes):  30   Critical care time was exclusive of:  Separately billable procedures and treating other patients   Critical care was necessary to treat or prevent imminent or life-threatening deterioration of the following conditions:  Sepsis, cardiac failure and circulatory failure   Critical care was time spent personally by me on the following activities:  Development of treatment plan with patient or surrogate, discussions with consultants, evaluation of patient's response to treatment, examination of patient, ordering and review of laboratory studies, ordering and review of radiographic studies, ordering and performing treatments and interventions, pulse oximetry, re-evaluation of patient's condition and review of old charts   Medications  vancomycin (VANCOREADY) IVPB 2000 mg/400 mL (2,000 mg Intravenous New Bag/Given 07/31/23 2007)  ceFEPIme (MAXIPIME) 2 g in sodium chloride  0.9 % 100 mL IVPB (0 g Intravenous Stopped 07/31/23 2052)  sodium chloride  0.9 % bolus 2,000 mL (2,000 mLs Intravenous New Bag/Given 07/31/23 2006)     IMPRESSION / MDM / ASSESSMENT AND PLAN / ED COURSE  I reviewed the triage vital signs and the nursing notes.  Differential diagnosis includes, but is not limited to, sepsis or bacteremia from cellulitis, abscess, deep space infection, viral syndrome, pneumonia  {Patient presents with symptoms of an acute illness or injury that is potentially life-threatening.  Patient presents with sepsis criteria possibly from cellulitis of her leg.  She is tachycardic in A-fib with RVR but stable without signs of shock.  Looks much better after fever breaks with Tylenol  from EMS.  Generally reassuring serum workup with a normal CBC and metabolic panel.  Low procalcitonin, no signs of infection in the urine.  Normal lactic acid.  Possibly a viral syndrome, but her leg still has cellulitic features.  A-fib rates improving with IV  fluids, we will provide antibiotics and consult with medicine for admission  Clinical Course as of 07/31/23 2057  Mon Jul 31, 2023  1958 Fever today [DS]  2049 Reassessed.  [employer] friends at the bedside.  We discussed workup overall and possible etiologies of her symptoms.  She reports feeling much better after the fever has broken. [DS]    Clinical Course User Index [DS] Claudene Rover, MD     FINAL CLINICAL IMPRESSION(S) / ED DIAGNOSES   Final diagnoses:  Fever, unspecified fever cause  Atrial fibrillation with RVR (HCC)  SIRS (systemic inflammatory response syndrome) (  HCC)     Rx / DC Orders   ED Discharge Orders     None        Note:  This document was prepared using Dragon voice recognition software and may include unintentional dictation errors.   Claudene Rover, MD 07/31/23 [phone]

## 2023-07-31 NOTE — H&P (Incomplete)
 History and Physical    Patient: Lauren [name] FMW:[id number] DOB: 1946-01-30 DOA: 07/31/2023 DOS: the patient was seen and examined on 07/31/2023 PCP: Randeen Laine LABOR, MD  Patient coming from: Home  Chief Complaint:  Chief Complaint  Patient presents with   Code Sepsis    HPI: Lauren [name] is a 78 y.o. female with medical history significant for Sjogren's on pilocarpine , OSA on CPAP, history of CVA, HTN, A-fib on Eliquis  (failed DCCV in 2022), with recent right lower extremity cellulitis from an infected abrasion to her right shin s/p 2 concurrent courses of antibiotics(Doxy, 6/13-6/20 then Bactrim , 6/20-6/23) who presents with fever and fatigue starting a day after she completed the Bactrim .  She reports some improvement in leg pain, swelling and redness but also reports a new cough.  She denies chest pain, nausea or vomiting.  Denies dysuria or change in bowel habits. In the ED, febrile to 101.5, and RVR to 121, tachypneic to 23, BP 136/90.  Labs with essentially normal CBC with differential negative lactic acid and procalcitonin, normal urinalysis and unremarkable CMP.EKG with rapid A-fib at 125 with some VPCs and chest x-ray clear Patient started on sepsis fluids, vancomycin and cefepime to treat sepsis secondary to possible cellulitis/wound infection with inadequate response to outpatient antibiotic therapy.  Admission requested     Review of Systems: As mentioned in the history of present illness. All other systems reviewed and are negative.  Past Medical History:  Diagnosis Date   Breast cancer (HCC) 2002   DCIS; BRCA 1 pos HER2 pos, ER2 pos   Cataract Removed 2021   DDD (degenerative disc disease)    DNR (do not resuscitate) 06/13/2020   GERD (gastroesophageal reflux disease)    History of echocardiogram    a. 06/2020 Echo: EF 55-60%, no rwma, nl RV size/fxn. Mildly dil LA.   HLD (hyperlipidemia)    statin intolerant   Hyperglycemia 10/2007   A1C elvated to 6.2    Neuromuscular disorder (HCC) Carpal Tunnel L and R   Obstructive sleep apnea    Osteoarthritis    Osteopenia 2007   ? from xray with nl dexa   Oxygen deficiency 10/18   Persistent atrial fibrillation (HCC)    a. Dx 06/2020 in setting of stroke. CHA2DS2VASc = 5-->Eliquis ; b. 08/2020 Successful DCCV; c. 09/2020 Office f/u- pt in rate-controlled Afib.   Reactive depression (situational)    very well controlled, and now takes prozac  for sleep   Sjogren's syndrome (HCC)    Sleep apnea 10/18   Stroke (HCC) 06/2020   Urine incontinence    Vitamin D  deficiency    Past Surgical History:  Procedure Laterality Date   BILATERAL OOPHORECTOMY  03/2003   BREAST SURGERY  Bilateral breast removed 2001   CARDIOVERSION N/A 08/24/2020   Procedure: CARDIOVERSION;  Surgeon: Darron Deatrice LABOR, MD;  Location: [facility] ORS;  Service: Cardiovascular;  Laterality: N/A;   CATARACT EXTRACTION, BILATERAL     10/2019, 12/2019   COLONOSCOPY  06/2005   normal; recheck 3 years   EYE SURGERY  cataracts 2021   JOINT REPLACEMENT  Bilateral knee 2006 hip 2010   LUMBAR DISC SURGERY  1991   Ruptured disk L5   MASTECTOMY  2001   bilateral   TONSILLECTOMY  1974   TOTAL HIP ARTHROPLASTY  2011   TOTAL KNEE ARTHROPLASTY  2006   bilateral   TUBAL LIGATION  1982   Social History:  reports that she has never smoked. She has never used smokeless tobacco. She  reports that she does not currently use alcohol. She reports that she does not use drugs.  Allergies  Allergen Reactions   Crestor  [Rosuvastatin ]     myalgias   Lipitor [Atorvastatin]     myalgias   Statins     joint pain   Tramadol      Night mares , does not help pain    Wasp Venom Swelling    Family History  Problem Relation Age of Onset   Arthritis Other        family hx   Breast cancer Other        1st degree relative <50   Diabetes Other        1st degree relative   Hyperlipidemia Other        family hx   Hypertension Other        family hx    Ovarian cancer Other        family hx   Prostate cancer Other        1st degree relative <50   Dementia Mother    Cancer Mother    Hearing loss Mother    Psoriasis Brother    Cancer Brother    Obesity Brother    Other Daughter        BRCA gene   ADD / ADHD Daughter    Cancer Daughter    Brain cancer Brother    Cancer Brother    Stroke Maternal Grandmother    Heart disease Maternal Grandmother    Arthritis Father    Cancer Father    Hearing loss Father    Alcohol abuse Maternal Grandfather     Prior to Admission medications   Medication Sig Start Date End Date Taking? Authorizing Provider  apixaban  (ELIQUIS ) 5 MG TABS tablet Take 1 tablet (5 mg total) by mouth 2 (two) times daily. 03/30/23   Furth, Cadence H, PA-C  DULoxetine  (CYMBALTA ) 30 MG capsule Take 1 capsule (30 mg total) by mouth daily. 04/14/23   Tower, Laine LABOR, MD  Evolocumab  (REPATHA  SURECLICK) 140 MG/ML SOAJ INJECT ONE DOSE SUBCUTANEOUSLY EVERY 14 DAYS 03/30/23   Furth, Cadence H, PA-C  gabapentin  (NEURONTIN ) 300 MG capsule Take 2 by mouth in am and 3 in pm 04/14/23   Tower, Laine LABOR, MD  methocarbamol  (ROBAXIN ) 500 MG tablet Take 1 tablet (500 mg total) by mouth 2 (two) times daily as needed for muscle spasms. Cauttion of sedation 04/14/23   Tower, Laine LABOR, MD  metoprolol  tartrate (LOPRESSOR ) 25 MG tablet Take 1 tablet (25 mg total) by mouth 2 (two) times daily. 03/30/23   Furth, Cadence H, PA-C  prednisoLONE acetate (PRED FORTE) 1 % ophthalmic suspension Place 1 drop into the left eye 4 (four) times daily. 07/25/23 08/18/23  [provider]  sulfamethoxazole -trimethoprim  (BACTRIM  DS) 800-160 MG tablet Take 1 tablet by mouth 2 (two) times daily. With food 07/24/23   Randeen Laine LABOR, MD    Physical Exam: Vitals:   07/31/23 1918 07/31/23 2056 07/31/23 2100 07/31/23 2130  BP: (!) 136/90 115/71 121/71 117/89  Pulse: (!) 121 (!) 105 82 (!) 110  Resp: (!) 23 20 (!) 28 20  Temp: (!) 101.5 F (38.6 C) 99.1 F (37.3 C)     TempSrc:  Oral    SpO2: 96% 93% 96% 94%   Physical Exam  Labs on Admission: I have personally reviewed following labs and imaging studies  CBC: Recent Labs  Lab 07/31/23 1936  WBC 10.0  NEUTROABS 7.8*  HGB 13.8  HCT 40.7  MCV 84.1  PLT 206   Basic Metabolic Panel: Recent Labs  Lab 07/31/23 1936  NA 133*  K 3.7  CL 99  CO2 20*  GLUCOSE 132*  BUN 14  CREATININE 0.61  CALCIUM  8.5*   GFR: Estimated Creatinine Clearance: 74.2 mL/min (by C-G formula based on SCr of 0.61 mg/dL). Liver Function Tests: Recent Labs  Lab 07/31/23 1936  AST 17  ALT 11  ALKPHOS 75  BILITOT 0.8  PROT 7.4  ALBUMIN 3.6   No results for input(s): LIPASE, AMYLASE in the last 168 hours. No results for input(s): AMMONIA in the last 168 hours. Coagulation Profile: Recent Labs  Lab 07/31/23 1936  INR 1.2   Cardiac Enzymes: No results for input(s): CKTOTAL, CKMB, CKMBINDEX, TROPONINI in the last 168 hours. BNP (last 3 results) No results for input(s): PROBNP in the last 8760 hours. HbA1C: No results for input(s): HGBA1C in the last 72 hours. CBG: No results for input(s): GLUCAP in the last 168 hours. Lipid Profile: No results for input(s): CHOL, HDL, LDLCALC, TRIG, CHOLHDL, LDLDIRECT in the last 72 hours. Thyroid  Function Tests: No results for input(s): TSH, T4TOTAL, FREET4, T3FREE, THYROIDAB in the last 72 hours. Anemia Panel: No results for input(s): VITAMINB12, FOLATE, FERRITIN, TIBC, IRON, RETICCTPCT in the last 72 hours. Urine analysis:    Component Value Date/Time   COLORURINE YELLOW (A) 07/31/2023 2011   APPEARANCEUR CLEAR (A) 07/31/2023 2011   LABSPEC 1.025 07/31/2023 2011   PHURINE 5.0 07/31/2023 2011   GLUCOSEU NEGATIVE 07/31/2023 2011   HGBUR NEGATIVE 07/31/2023 2011   BILIRUBINUR NEGATIVE 07/31/2023 2011   BILIRUBINUR negative 09/11/2019 1324   KETONESUR 5 (A) 07/31/2023 2011   PROTEINUR NEGATIVE 07/31/2023 2011    UROBILINOGEN 0.2 09/11/2019 1324   NITRITE NEGATIVE 07/31/2023 2011   LEUKOCYTESUR NEGATIVE 07/31/2023 2011    Radiological Exams on Admission: DG Chest Port 1 View Result Date: 07/31/2023 CLINICAL DATA:  Fever. EXAM: PORTABLE CHEST 1 VIEW COMPARISON:  08/22/2011. FINDINGS: The heart size and mediastinal contours are within normal limits. No pneumothorax or pleural effusion. Stable medial right basilar density likely represents pericardial fat and is unchanged since 2013. Lungs are otherwise clear. Aorta is calcified. The visualized skeletal structures are unremarkable. IMPRESSION: No active disease. Electronically Signed   By: Fonda Field M.D.   On: 07/31/2023 20:05   Data Reviewed for HPI: Relevant notes from primary care and specialist visits, past discharge summaries as available in EHR, including Care Everywhere. Prior diagnostic testing as pertinent to current admission diagnoses Updated medications and problem lists for reconciliation ED course, including vitals, labs, imaging, treatment and response to treatment Triage notes, nursing and pharmacy notes and ED provider's notes Notable results as noted above in HPI      Assessment and Plan: * Cellulitis of right lower extremity without foot SIRS possible sepsis SIRS criteria include fever, tachycardia and tachypnea.  WBC and lactic acid and procalcitonin all normal in the setting of prior 10 days of antibiotics UA and CXR not consistent with GU or respiratory tract infection Rocephin and vancomycin Keep leg elevated Wound care  Chronic atrial fibrillation with RVR (HCC) Heart rate in the 120s, likely driven by infection Continue home rate control agents of metoprolol  for now Might improve with IV fluids for treatment of possible sepsis Continue apixaban   History of CVA (cerebrovascular accident) No acute issues suspected Continue home meds pending med rec  Obstructive sleep apnea Continue CPAP  Sjogren's  syndrome (  HCC) No acute issues     DVT prophylaxis: Lovenox   Consults: none  Advance Care Planning:   Code Status: Prior   Family Communication: none  Disposition Plan: Back to previous home environment  Severity of Illness: The appropriate patient status for this patient is OBSERVATION. Observation status is judged to be reasonable and necessary in order to provide the required intensity of service to ensure the patient's safety. The patient's presenting symptoms, physical exam findings, and initial radiographic and laboratory data in the context of their medical condition is felt to place them at decreased risk for further clinical deterioration. Furthermore, it is anticipated that the patient will be medically stable for discharge from the hospital within 2 midnights of admission.   Author: Delayne LULLA Solian, MD 07/31/2023 9:38 PM  For on call review [email or web address].

## 2023-07-31 NOTE — Assessment & Plan Note (Signed)
 No acute issues.

## 2023-07-31 NOTE — ED Notes (Signed)
 Pt took her home med Metoprolol  at 2217

## 2023-07-31 NOTE — Assessment & Plan Note (Signed)
 No acute issues suspected Continue home meds pending med rec

## 2023-07-31 NOTE — Assessment & Plan Note (Signed)
 Continue CPAP.

## 2023-07-31 NOTE — ED Triage Notes (Signed)
 Pt arrived via [facility] from home with bilateral lower extremity cellulitis that pt completed antibiotic therapy for on 07/30/23. Pt with fever today, last with EMS 101.12F. Tylenol  PO 1g given in route by EMS. Hx/o A-fib.

## 2023-07-31 NOTE — Assessment & Plan Note (Deleted)
 SIRS possible sepsis SIRS criteria include fever, tachycardia and tachypnea.  WBC and lactic acid and procalcitonin all normal in the setting of prior 10 days of antibiotics UA and CXR not consistent with GU or respiratory tract infection Rocephin and vancomycin Keep leg elevated Wound care

## 2023-08-01 ENCOUNTER — Other Ambulatory Visit: Payer: Self-pay

## 2023-08-01 DIAGNOSIS — E785 Hyperlipidemia, unspecified: Secondary | ICD-10-CM | POA: Diagnosis present

## 2023-08-01 DIAGNOSIS — I4819 Other persistent atrial fibrillation: Secondary | ICD-10-CM | POA: Diagnosis present

## 2023-08-01 DIAGNOSIS — Z853 Personal history of malignant neoplasm of breast: Secondary | ICD-10-CM | POA: Diagnosis not present

## 2023-08-01 DIAGNOSIS — Z96653 Presence of artificial knee joint, bilateral: Secondary | ICD-10-CM | POA: Diagnosis present

## 2023-08-01 DIAGNOSIS — Z9010 Acquired absence of unspecified breast and nipple: Secondary | ICD-10-CM | POA: Diagnosis not present

## 2023-08-01 DIAGNOSIS — L089 Local infection of the skin and subcutaneous tissue, unspecified: Secondary | ICD-10-CM | POA: Diagnosis not present

## 2023-08-01 DIAGNOSIS — Z833 Family history of diabetes mellitus: Secondary | ICD-10-CM | POA: Diagnosis not present

## 2023-08-01 DIAGNOSIS — R509 Fever, unspecified: Secondary | ICD-10-CM | POA: Diagnosis present

## 2023-08-01 DIAGNOSIS — M3500 Sicca syndrome, unspecified: Secondary | ICD-10-CM | POA: Diagnosis present

## 2023-08-01 DIAGNOSIS — Y95 Nosocomial condition: Secondary | ICD-10-CM | POA: Diagnosis not present

## 2023-08-01 DIAGNOSIS — Z8249 Family history of ischemic heart disease and other diseases of the circulatory system: Secondary | ICD-10-CM | POA: Diagnosis not present

## 2023-08-01 DIAGNOSIS — Z7901 Long term (current) use of anticoagulants: Secondary | ICD-10-CM | POA: Diagnosis not present

## 2023-08-01 DIAGNOSIS — W2209XA Striking against other stationary object, initial encounter: Secondary | ICD-10-CM | POA: Diagnosis present

## 2023-08-01 DIAGNOSIS — I10 Essential (primary) hypertension: Secondary | ICD-10-CM | POA: Diagnosis present

## 2023-08-01 DIAGNOSIS — Z79899 Other long term (current) drug therapy: Secondary | ICD-10-CM | POA: Diagnosis not present

## 2023-08-01 DIAGNOSIS — L0390 Cellulitis, unspecified: Secondary | ICD-10-CM | POA: Diagnosis present

## 2023-08-01 DIAGNOSIS — G4733 Obstructive sleep apnea (adult) (pediatric): Secondary | ICD-10-CM | POA: Diagnosis present

## 2023-08-01 DIAGNOSIS — E876 Hypokalemia: Secondary | ICD-10-CM | POA: Diagnosis not present

## 2023-08-01 DIAGNOSIS — J1289 Other viral pneumonia: Secondary | ICD-10-CM | POA: Diagnosis not present

## 2023-08-01 DIAGNOSIS — S81802A Unspecified open wound, left lower leg, initial encounter: Secondary | ICD-10-CM | POA: Diagnosis not present

## 2023-08-01 DIAGNOSIS — L03116 Cellulitis of left lower limb: Secondary | ICD-10-CM | POA: Diagnosis present

## 2023-08-01 DIAGNOSIS — L03115 Cellulitis of right lower limb: Secondary | ICD-10-CM | POA: Diagnosis present

## 2023-08-01 DIAGNOSIS — Z66 Do not resuscitate: Secondary | ICD-10-CM | POA: Diagnosis present

## 2023-08-01 DIAGNOSIS — A419 Sepsis, unspecified organism: Secondary | ICD-10-CM | POA: Diagnosis present

## 2023-08-01 DIAGNOSIS — Z1152 Encounter for screening for COVID-19: Secondary | ICD-10-CM | POA: Diagnosis not present

## 2023-08-01 DIAGNOSIS — E66812 Obesity, class 2: Secondary | ICD-10-CM | POA: Diagnosis present

## 2023-08-01 DIAGNOSIS — R519 Headache, unspecified: Secondary | ICD-10-CM | POA: Diagnosis present

## 2023-08-01 DIAGNOSIS — E559 Vitamin D deficiency, unspecified: Secondary | ICD-10-CM | POA: Diagnosis present

## 2023-08-01 DIAGNOSIS — K219 Gastro-esophageal reflux disease without esophagitis: Secondary | ICD-10-CM | POA: Diagnosis present

## 2023-08-01 DIAGNOSIS — Z8673 Personal history of transient ischemic attack (TIA), and cerebral infarction without residual deficits: Secondary | ICD-10-CM | POA: Diagnosis not present

## 2023-08-01 LAB — BASIC METABOLIC PANEL WITH GFR
Anion gap: 11 (ref 5–15)
BUN: 13 mg/dL (ref 8–23)
CO2: 21 mmol/L — ABNORMAL LOW (ref 22–32)
Calcium: 8.3 mg/dL — ABNORMAL LOW (ref 8.9–10.3)
Chloride: 108 mmol/L (ref 98–111)
Creatinine, Ser: 0.52 mg/dL (ref 0.44–1.00)
GFR, Estimated: >60 mL/min (ref >60)
Glucose, Bld: 107 mg/dL — ABNORMAL HIGH (ref 70–99)
Potassium: 3.6 mmol/L (ref 3.5–5.1)
Sodium: 138 mmol/L (ref 135–145)

## 2023-08-01 LAB — CBC
HCT: 41.1 % (ref 36.0–46.0)
Hemoglobin: 12.9 g/dL (ref 12.0–15.0)
MCH: 27.9 pg (ref 26.0–34.0)
MCHC: 31.4 g/dL (ref 30.0–36.0)
MCV: 88.8 fL (ref 80.0–100.0)
Platelets: 180 10*3/uL (ref 150–400)
RBC: 4.63 MIL/uL (ref 3.87–5.11)
RDW: 15.8 % — ABNORMAL HIGH (ref 11.5–15.5)
WBC: 7.9 10*3/uL (ref 4.0–10.5)
nRBC: 0 % (ref 0.0–0.2)

## 2023-08-01 MED ORDER — PROCHLORPERAZINE EDISYLATE 10 MG/2ML IJ SOLN
5.0000 mg | Freq: Four times a day (QID) | INTRAMUSCULAR | Status: DC | PRN
  Administered 2023-08-01: 5 mg via INTRAVENOUS
  Filled 2023-08-01 (×2): qty 1

## 2023-08-01 NOTE — Assessment & Plan Note (Addendum)
 SIRS possible sepsis Wound right shin with improving cellulitis SIRS criteria include fever, tachycardia and tachypnea.  WBC and lactic acid and procalcitonin all normal in the setting of prior 10 days of antibiotics UA and CXR not consistent with GU or respiratory tract infection Rocephin and vancomycin Keep leg elevated Wound care

## 2023-08-01 NOTE — Plan of Care (Signed)
  Problem: Fluid Volume: Goal: Hemodynamic stability will improve Outcome: Progressing   Problem: Clinical Measurements: Goal: Diagnostic test results will improve Outcome: Progressing Goal: Signs and symptoms of infection will decrease Outcome: Progressing   Problem: Respiratory: Goal: Ability to maintain adequate ventilation will improve Outcome: Progressing   Problem: Education: Goal: Knowledge of General Education information will improve Description: Including pain rating scale, medication(s)/side effects and non-pharmacologic comfort measures Outcome: Progressing   Problem: Health Behavior/Discharge Planning: Goal: Ability to manage health-related needs will improve Outcome: Progressing   Problem: Clinical Measurements: Goal: Ability to maintain clinical measurements within normal limits will improve Outcome: Progressing Goal: Will remain free from infection Outcome: Progressing Goal: Diagnostic test results will improve Outcome: Progressing Goal: Respiratory complications will improve Outcome: Progressing Goal: Cardiovascular complication will be avoided Outcome: Progressing   Problem: Activity: Goal: Risk for activity intolerance will decrease Outcome: Progressing   Problem: Nutrition: Goal: Adequate nutrition will be maintained Outcome: Progressing   Problem: Coping: Goal: Level of anxiety will decrease Outcome: Progressing   Problem: Elimination: Goal: Will not experience complications related to bowel motility Outcome: Progressing Goal: Will not experience complications related to urinary retention Outcome: Progressing   Problem: Pain Managment: Goal: General experience of comfort will improve and/or be controlled Outcome: Progressing   Problem: Safety: Goal: Ability to remain free from injury will improve Outcome: Progressing   Problem: Skin Integrity: Goal: Risk for impaired skin integrity will decrease Outcome: Progressing   Problem:  Education: Goal: Knowledge of disease or condition will improve Outcome: Progressing Goal: Understanding of medication regimen will improve Outcome: Progressing Goal: Individualized Educational Video(s) Outcome: Progressing   Problem: Activity: Goal: Ability to tolerate increased activity will improve Outcome: Progressing   Problem: Cardiac: Goal: Ability to achieve and maintain adequate cardiopulmonary perfusion will improve Outcome: Progressing   Problem: Health Behavior/Discharge Planning: Goal: Ability to safely manage health-related needs after discharge will improve Outcome: Progressing

## 2023-08-01 NOTE — Plan of Care (Signed)
  Problem: Fluid Volume: Goal: Hemodynamic stability will improve Outcome: Progressing   Problem: Clinical Measurements: Goal: Diagnostic test results will improve Outcome: Progressing   Problem: Clinical Measurements: Goal: Signs and symptoms of infection will decrease Outcome: Progressing   Problem: Education: Goal: Knowledge of General Education information will improve Description: Including pain rating scale, medication(s)/side effects and non-pharmacologic comfort measures Outcome: Progressing   Problem: Clinical Measurements: Goal: Ability to maintain clinical measurements within normal limits will improve Outcome: Progressing   Problem: Clinical Measurements: Goal: Will remain free from infection Outcome: Progressing   Problem: Clinical Measurements: Goal: Diagnostic test results will improve Outcome: Progressing   Problem: Clinical Measurements: Goal: Cardiovascular complication will be avoided Outcome: Progressing   Problem: Coping: Goal: Level of anxiety will decrease Outcome: Progressing   Problem: Nutrition: Goal: Adequate nutrition will be maintained Outcome: Progressing   Problem: Safety: Goal: Ability to remain free from injury will improve Outcome: Progressing   Problem: Skin Integrity: Goal: Risk for impaired skin integrity will decrease Outcome: Progressing    Plan of care, assessment, monitoring, treatment and intervention (s) ongoing, see MAR see flowsheet

## 2023-08-01 NOTE — Progress Notes (Signed)
  PROGRESS NOTE    Lauren [name]  FMW:[id number] DOB: 1945-11-05 DOA: 07/31/2023 PCP: Randeen Laine LABOR, MD  [id number]  LOS: 0 days   Brief hospital course:   Assessment & Plan: Lauren [name] is a 78 y.o. female with medical history significant for Sjogren's on pilocarpine , OSA on CPAP, history of CVA, HTN, A-fib on Eliquis  (failed DCCV in 2022), being admitted with cellulitis secondary to infected wound bilateral lower legs presenting with fever in spite of 2 courses of outpatient antibiotics(Doxy, 6/13-6/20 then Bactrim , 6/20-6/23).  Patient sustained abrasions to her anterior lower legs while climbing out of a pool.  She lost her footing, sliding down and scraped the anterior shins.  Following the antibiotics the right leg seem to be improving however she did have some draining from the wound on the left anterior leg and continues to have pain in that area.  On the day of arrival she developed a fever.   In the ED, febrile to 101.5     Bilateral LE cellulitis --pain, erythema and warmth surrounding linear scabbed wounds sustained from trauma to the skin.  Did not resolve with outpatient oral abx. --started on ceftriaxone and Vanc on presentation. --cont ceftriaxone --d/c Vanc  Sepsis --fever, tachycardia, source cellulitis.  UA and CXR not consistent with GU or respiratory tract infection.  Chronic atrial fibrillation with RVR (HCC) Heart rate in the 120s, likely driven by infection.  Rate became controlled just with resuming home lopressor  25 mg BID --cont home lopressor  --cont Eliquis   History of CVA (cerebrovascular accident) No acute issues suspected  Obstructive sleep apnea Continue CPAP nightly  Sjogren's syndrome (HCC) No acute issues   DVT prophylaxis: On:Eliquis  Code Status: DNR  Family Communication:  Level of care: Med-Surg Dispo:   The patient is from: home Anticipated d/c is to: home Anticipated d/c date is: 2-3 days   Subjective and Interval History:   Pt reported 1 episode of diarrhea after receiving abx.  No URI symptoms or dysuria.     Objective: Vitals:   08/01/23 0838 08/01/23 1000 08/01/23 1131 08/01/23 1615  BP: 113/77  118/73 120/66  Pulse: (!) 109  87 91  Resp: 18  18 20   Temp: (!) 100.7 F (38.2 C)  98.9 F (37.2 C) 98.8 F (37.1 C)  TempSrc:    Oral  SpO2: 94% 96% 97% 100%  Weight:      Height:        Intake/Output Summary (Last 24 hours) at 08/01/2023 1751 Last data filed at 08/01/2023 0900 Gross per 24 hour  Intake 617.5 ml  Output --  Net 617.5 ml   Filed Weights   08/01/23 0200  Weight: 105.5 kg    Examination:   Constitutional: NAD, AAOx3 HEENT: conjunctivae and lids normal, EOMI CV: No cyanosis.   RESP: normal respiratory effort, on RA Extremities: linear scabbed wound over both shins, with surrounding erythema and warmth SKIN: warm, dry Neuro: II - XII grossly intact.   Psych: Normal mood and affect.  Appropriate judgement and reason   Data Reviewed: I have personally reviewed labs and imaging studies  Time spent: 50 minutes  Ellouise Haber, MD [facility] If 7PM-7AM, please contact night-coverage 08/01/2023, 5:51 PM

## 2023-08-02 DIAGNOSIS — L089 Local infection of the skin and subcutaneous tissue, unspecified: Secondary | ICD-10-CM | POA: Diagnosis not present

## 2023-08-02 DIAGNOSIS — S81802A Unspecified open wound, left lower leg, initial encounter: Secondary | ICD-10-CM | POA: Diagnosis not present

## 2023-08-02 MED ORDER — PROCHLORPERAZINE EDISYLATE 10 MG/2ML IJ SOLN
5.0000 mg | Freq: Four times a day (QID) | INTRAMUSCULAR | Status: DC | PRN
  Administered 2023-08-02: 5 mg via INTRAVENOUS
  Filled 2023-08-02 (×3): qty 1

## 2023-08-02 MED ORDER — PROMETHAZINE HCL 25 MG PO TABS
12.5000 mg | ORAL_TABLET | Freq: Four times a day (QID) | ORAL | Status: DC | PRN
  Administered 2023-08-03 (×2): 12.5 mg via ORAL
  Filled 2023-08-02 (×7): qty 1

## 2023-08-02 NOTE — Plan of Care (Signed)
°  Problem: Clinical Measurements: °Goal: Diagnostic test results will improve °Outcome: Progressing °  °Problem: Clinical Measurements: °Goal: Ability to maintain clinical measurements within normal limits will improve °Outcome: Progressing °  °

## 2023-08-02 NOTE — Hospital Course (Addendum)
 Hospital course / significant events:   HPI: Lauren [name] is a 78 y.o. female with medical history significant for Sjogren's on pilocarpine , OSA on CPAP, history of CVA, HTN, A-fib on Eliquis  (failed DCCV in 2022), with cellulitis secondary to infected wound bilateral lower legs d/t abrasions to anterior lower legs while climbing out of a pool.  Had 2 courses of outpatient antibiotics (Doxy, 6/13-6/20 then Bactrim , 6/20-6/23).  Following abx the right leg seem to be improving however she did have some draining from the wound on the left anterior leg and continues to have pain in that area.  On the day of arrival she developed a fever.   06/23: to ED in the evening. Admitted to hospitalist. Given Rocephin , Vanc.  06/24: continuing IV abx w/ Rocephin   06/25: stil lfebrile as of 02:30 and again 16:00, anticipate d/c once fever-free 24+ hours. Reports skin looks/feels improved  06/26: reports worsening cough, CXR (+)lobar pneumonia and resp viral (+)adenovirus, still intermittently febrile, escalating abx 06/27: continue IV abx, repeat CXR in AM 06/28: CXR not improvement but also not worse, pt feeling some better but still weak, not ambulating much      Consultants:  none  Procedures/Surgeries: none      ASSESSMENT & PLAN:  Lobar Pneumonia new dx 08/03/23  Concern for hospital acquired pneumonia - question if CXR on admission was in fact showing developing RLL infiltrate vs persistent pericardial benign finding as noted, either way definitely lobar pneumonia now  (+)Adenovirus  Not septic, no respiratory failure  No hx tobacco/secondhand smoke or occupational respiratory exposure, no known sick contacts, no COPD/asthma  Cefepime  for possible HCAP Duoneb Antitussive Incentive spirometry, flutter valve Legionella testing pending  Sputum culture pending  Low threshold for CT imaging if not improving but pt has been stable/improving   Did not get blood cultures as not meeting sepsis  criteria and already received IV abx, would obtain if persistent fever despite abx change  Bilateral LE cellulitis failed outpatient po antibiotics  Sepsis parameters resolved  Skin is improving  Rocephin  switched to Cefepime  as above   Chronic atrial fibrillation with RVR  Heart rate in the 120s on presentation, likely driven by infection.  Rate controlled with resuming home lopressor  25 mg BID cont home lopressor  cont Eliquis    Hypokalemia Replace as needed Monitor BMP  History of CVA (cerebrovascular accident) No acute issues suspected   Obstructive sleep apnea Continue CPAP nightly / when asleep    Sjogren's syndrome  No acute issues   Class 2 obesity based on BMI: Body mass index is 35.36 kg/m.SABRA Significantly low or high BMI is associated with higher medical risk.  Underweight - under 18  overweight - 25 to 29 obese - 30 or more Class 1 obesity: BMI of 30.0 to 34 Class 2 obesity: BMI of 35.0 to 39 Class 3 obesity: BMI of 40.0 to 49 Super Morbid Obesity: BMI 50-59 Super-super Morbid Obesity: BMI 60+ Healthy nutrition and physical activity advised as adjunct to other disease management and risk reduction treatments    DVT prophylaxis: eliquis  IV fluids: no continuous IV fluids  Nutrition: regular Central lines / other devices: none  Code Status: DNR ACP documentation reviewed:  none on file in [facility]  TOC needs: none Medical barriers to dispo: IV abx, anticipate dc on po abx tomorrow if feeling improved and ambulating well

## 2023-08-02 NOTE — Progress Notes (Signed)
 PROGRESS NOTE    Lauren [name]   FMW:[id number] DOB: 18-Mar-1945  DOA: 07/31/2023 Date of Service: 08/02/23 which is hospital day 1  PCP: Randeen Laine LABOR, MD    Hospital course / significant events:   HPI: Lauren [name] is a 78 y.o. female with medical history significant for Sjogren's on pilocarpine , OSA on CPAP, history of CVA, HTN, A-fib on Eliquis  (failed DCCV in 2022), with cellulitis secondary to infected wound bilateral lower legs d/t abrasions to anterior lower legs while climbing out of a pool.  Had 2 courses of outpatient antibiotics (Doxy, 6/13-6/20 then Bactrim , 6/20-6/23).  Following abx the right leg seem to be improving however she did have some draining from the wound on the left anterior leg and continues to have pain in that area.  On the day of arrival she developed a fever.   06/23: to ED in the evening. Admitted to hospitalist. Given Rocephin, Vanc.  06/24: continuing IV abx w/ Rocephin  06/25: stil lfebrile as of 02:30 anticipate d/c once fever-free 24+ hours. Reports skin looks/feels improved      Consultants:  none  Procedures/Surgeries: none      ASSESSMENT & PLAN:  Bilateral LE cellulitis failed outpatient po antibiotics  Sepsis parameters resolved  Continue Rocephin Anticipate d/c on po anx once afebrile 24+ hours   Chronic atrial fibrillation with RVR  Heart rate in the 120s on presentation, likely driven by infection.  Rate controlled just with resuming home lopressor  25 mg BID cont home lopressor  cont Eliquis    History of CVA (cerebrovascular accident) No acute issues suspected   Obstructive sleep apnea Continue CPAP nightly   Sjogren's syndrome  No acute issues   Class 2 obesity based on BMI: Body mass index is 35.36 kg/m.SABRA Significantly low or high BMI is associated with higher medical risk.  Underweight - under 18  overweight - 25 to 29 obese - 30 or more Class 1 obesity: BMI of 30.0 to 34 Class 2 obesity: BMI of 35.0 to  39 Class 3 obesity: BMI of 40.0 to 49 Super Morbid Obesity: BMI 50-59 Super-super Morbid Obesity: BMI 60+ Healthy nutrition and physical activity advised as adjunct to other disease management and risk reduction treatments    DVT prophylaxis: eliquis  IV fluids: no continuous IV fluids  Nutrition: regular Central lines / other devices: none  Code Status: DNR ACP documentation reviewed:  none on file in [facility]  TOC needs: none Medical barriers to dispo: fever. Expected medical readiness for discharge tomorrow.              Subjective / Brief ROS:  Patient reports skin is looking better Denies CP/SOB.  Pain controlled.  Denies new weakness.  Tolerating diet.  Reports no concerns w/ urination/defecation.   Family Communication: none at this time     Objective Findings:  Vitals:   08/02/23 0236 08/02/23 0300 08/02/23 0352 08/02/23 0726  BP: (!) 141/111 137/85  117/78  Pulse: 100 86  87  Resp: 20 20  17   Temp: (!) 101.3 F (38.5 C)  100 F (37.8 C) 99 F (37.2 C)  TempSrc: Oral  Oral Oral  SpO2: 98% 95%  96%  Weight:      Height:        Intake/Output Summary (Last 24 hours) at 08/02/2023 1559 Last data filed at 08/02/2023 1451 Gross per 24 hour  Intake 345.83 ml  Output --  Net 345.83 ml   Filed Weights   08/01/23 0200  Weight: 105.5 kg  Examination:  Physical Exam Constitutional:      General: She is not in acute distress.  Cardiovascular:     Rate and Rhythm: Normal rate and regular rhythm.  Pulmonary:     Effort: Pulmonary effort is normal.     Breath sounds: Normal breath sounds.   Skin:    General: Skin is warm and dry.     Comments: Scabbed-over longitudinal abrasions on anterior lower extremities, minimal erythema/warmth (Pt reports improvement in redness and pain)    Neurological:     General: No focal deficit present.     Mental Status: She is alert and oriented to person, place, and time.   Psychiatric:        Mood and  Affect: Mood normal.        Behavior: Behavior normal.          Scheduled Medications:   apixaban   5 mg Oral BID   DULoxetine   30 mg Oral Daily   metoprolol  tartrate  25 mg Oral BID    Continuous Infusions:  cefTRIAXone (ROCEPHIN)  IV 200 mL/hr at 08/02/23 0550    PRN Medications:  acetaminophen  **OR** acetaminophen , guaiFENesin -dextromethorphan, HYDROcodone-acetaminophen , prochlorperazine, promethazine   Antimicrobials from admission:  Anti-infectives (From admission, onward)    Start     Dose/Rate Route Frequency Ordered Stop   08/01/23 1800  vancomycin (VANCOREADY) IVPB 1500 mg/300 mL  Status:  Discontinued        1,500 mg 150 mL/hr over 120 Minutes Intravenous Every 24 hours 07/31/23 2155 08/01/23 1032   08/01/23 0600  cefTRIAXone (ROCEPHIN) 2 g in sodium chloride  0.9 % 100 mL IVPB        2 g 200 mL/hr over 30 Minutes Intravenous Every 24 hours 07/31/23 2140     07/31/23 2000  vancomycin (VANCOREADY) IVPB 2000 mg/400 mL        2,000 mg 200 mL/hr over 120 Minutes Intravenous  Once 07/31/23 1934 07/31/23 2220   07/31/23 1945  ceFEPIme (MAXIPIME) 2 g in sodium chloride  0.9 % 100 mL IVPB        2 g 200 mL/hr over 30 Minutes Intravenous  Once 07/31/23 1934 07/31/23 2052           Data Reviewed:  I have personally reviewed the following...  CBC: Recent Labs  Lab 07/31/23 1936 08/01/23 0413  WBC 10.0 7.9  NEUTROABS 7.8*  --   HGB 13.8 12.9  HCT 40.7 41.1  MCV 84.1 88.8  PLT 206 180   Basic Metabolic Panel: Recent Labs  Lab 07/31/23 1936 08/01/23 0413  NA 133* 138  K 3.7 3.6  CL 99 108  CO2 20* 21*  GLUCOSE 132* 107*  BUN 14 13  CREATININE 0.61 0.52  CALCIUM  8.5* 8.3*   GFR: Estimated Creatinine Clearance: 73.7 mL/min (by C-G formula based on SCr of 0.52 mg/dL). Liver Function Tests: Recent Labs  Lab 07/31/23 1936  AST 17  ALT 11  ALKPHOS 75  BILITOT 0.8  PROT 7.4  ALBUMIN 3.6   No results for input(s): LIPASE, AMYLASE in the  last 168 hours. No results for input(s): AMMONIA in the last 168 hours. Coagulation Profile: Recent Labs  Lab 07/31/23 1936  INR 1.2   Cardiac Enzymes: No results for input(s): CKTOTAL, CKMB, CKMBINDEX, TROPONINI in the last 168 hours. BNP (last 3 results) No results for input(s): PROBNP in the last 8760 hours. HbA1C: No results for input(s): HGBA1C in the last 72 hours. CBG: No results for input(s): GLUCAP in the  last 168 hours. Lipid Profile: No results for input(s): CHOL, HDL, LDLCALC, TRIG, CHOLHDL, LDLDIRECT in the last 72 hours. Thyroid  Function Tests: No results for input(s): TSH, T4TOTAL, FREET4, T3FREE, THYROIDAB in the last 72 hours. Anemia Panel: No results for input(s): VITAMINB12, FOLATE, FERRITIN, TIBC, IRON, RETICCTPCT in the last 72 hours. Most Recent Urinalysis On File:     Component Value Date/Time   COLORURINE YELLOW (A) 07/31/2023 2011   APPEARANCEUR CLEAR (A) 07/31/2023 2011   LABSPEC 1.025 07/31/2023 2011   PHURINE 5.0 07/31/2023 2011   GLUCOSEU NEGATIVE 07/31/2023 2011   HGBUR NEGATIVE 07/31/2023 2011   BILIRUBINUR NEGATIVE 07/31/2023 2011   BILIRUBINUR negative 09/11/2019 1324   KETONESUR 5 (A) 07/31/2023 2011   PROTEINUR NEGATIVE 07/31/2023 2011   UROBILINOGEN 0.2 09/11/2019 1324   NITRITE NEGATIVE 07/31/2023 2011   LEUKOCYTESUR NEGATIVE 07/31/2023 2011   Sepsis Labs: @LABRCNTIP (procalcitonin:4,lacticidven:4) Microbiology: Recent Results (from the past 240 hours)  Blood Culture (routine x 2)     Status: None (Preliminary result)   Collection Time: 07/31/23  7:36 PM   Specimen: BLOOD  Result Value Ref Range Status   Specimen Description BLOOD BLOOD RIGHT ARM  Final   Special Requests   Final    BOTTLES DRAWN AEROBIC AND ANAEROBIC Blood Culture results may not be optimal due to an inadequate volume of blood received in culture bottles   Culture   Final    NO GROWTH 2 DAYS Performed at [facility], [address], [place], [place] [address]    Report Status PENDING  Incomplete  Blood Culture (routine x 2)     Status: None (Preliminary result)   Collection Time: 07/31/23  7:36 PM   Specimen: BLOOD  Result Value Ref Range Status   Specimen Description BLOOD BLOOD RIGHT ARM  Final   Special Requests   Final    BOTTLES DRAWN AEROBIC AND ANAEROBIC Blood Culture results may not be optimal due to an inadequate volume of blood received in culture bottles   Culture   Final    NO GROWTH 2 DAYS Performed at [facility], [address], [place], [place] [address]    Report Status PENDING  Incomplete      Radiology Studies last 3 days: DG Chest Port 1 View Result Date: 07/31/2023 CLINICAL DATA:  Fever. EXAM: PORTABLE CHEST 1 VIEW COMPARISON:  08/22/2011. FINDINGS: The heart size and mediastinal contours are within normal limits. No pneumothorax or pleural effusion. Stable medial right basilar density likely represents pericardial fat and is unchanged since 2013. Lungs are otherwise clear. Aorta is calcified. The visualized skeletal structures are unremarkable. IMPRESSION: No active disease. Electronically Signed   By: Fonda Field M.D.   On: 07/31/2023 20:05        Jovanka Westgate, DO [facility] 08/02/2023, 3:59 PM    Dictation software may have been used to generate the above note. Typos may occur and escape review in typed/dictated notes. Please contact Dr Marsa directly for clarity if needed.  Staff may message me via secure chat in Epic  but this may not receive an immediate response,  please page me for urgent matters!  If 7PM-7AM, please contact night coverage [email or web address]

## 2023-08-02 NOTE — TOC Initial Note (Signed)
 Transition of Care [facility]) - Initial/Assessment Note    Patient Details  Name: Lauren [name] MRN: [id number] Date of Birth: 11/23/1945  [facility]) CM/SW Contact:    Quintella Suzen Jansky, RN Phone Number: 08/02/2023, 8:05 AM  Clinical Narrative:                  Patient lives alone, independent with ADLs. Has PCP in the community for follow-up. No discharge needs identified by TOC at this time. Please place [facility] consult if patient has discharge needs.       Patient Goals and CMS Choice            Expected Discharge Plan and Services                                              Prior Living Arrangements/Services                       Activities of Daily Living   ADL Screening (condition at time of admission) Independently performs ADLs?: Yes (appropriate for developmental age) Is the patient deaf or have difficulty hearing?: No Does the patient have difficulty seeing, even when wearing glasses/contacts?: No Does the patient have difficulty concentrating, remembering, or making decisions?: No  Permission Sought/Granted                  Emotional Assessment              Admission diagnosis:  Rapid atrial fibrillation (HCC) [I48.91] SIRS (systemic inflammatory response syndrome) (HCC) [R65.10] Atrial fibrillation with RVR (HCC) [I48.91] Fever, unspecified fever cause [R50.9] Cellulitis [L03.90] Patient Active Problem List   Diagnosis Date Noted   Traumatic wound of lower leg with infection, left, initial encounter 08/01/2023   Cellulitis 08/01/2023   SIRS (systemic inflammatory response syndrome) (HCC) 07/31/2023   Subclinical hyperthyroidism 04/18/2023   Abnormal TSH 04/17/2023   IBS (irritable bowel syndrome) 05/24/2021   Chronic atrial fibrillation with RVR (HCC) 09/29/2020   Myofascial pain 09/29/2020   History of CVA (cerebrovascular accident) 06/13/2020   DNR (do not resuscitate) 06/13/2020   Dermatochalasis of  both upper eyelids 07/15/2019   Ptosis of both upper eyelids 07/15/2019   Carpal tunnel syndrome 01/25/2019   Age-related nuclear cataract, bilateral 06/19/2018   Vitamin B12 deficiency 03/27/2018   Sjogren's disease (HCC) 01/15/2018   Osteoarthritis 01/15/2018   Obesity (BMI 30-39.9) 01/15/2018   H/O herpes zoster 11/22/2017   Estrogen deficiency 01/09/2017   Routine general medical examination at a health care facility 01/01/2017   Obstructive sleep apnea 09/18/2016   Bee sting allergy 06/10/2014   Encounter for Medicare annual wellness exam 12/03/2013   History of shingles 11/27/2013   Hx of breast cancer 08/22/2011   Encounter for gynecological examination 02/14/2011   Bilateral dry eyes 01/20/2011   Dry eye syndrome of bilateral lacrimal glands 01/20/2011   Sjogren's syndrome (HCC) 04/23/2008   Vitamin D  deficiency 04/22/2008   Hyperlipidemia 04/22/2008   GERD 04/22/2008   Prediabetes 04/22/2008   PCP:  Randeen Laine LABOR, MD Pharmacy:   [facility] [address] [place] - [address] [place] [address] Phone: [phone] Fax: [phone]     Social Drivers of Health (SDOH) Social History: SDOH Screenings   Food Insecurity: No Food Insecurity (08/01/2023)  Housing: Low Risk  (08/01/2023)  Transportation Needs: No Transportation Needs (08/01/2023)  Utilities: Not At Risk (08/01/2023)  Alcohol Screen: Low Risk  (02/17/2022)  Depression (PHQ2-9): Low Risk  (07/24/2023)  Financial Resource Strain: Low Risk  (07/24/2023)  Physical Activity: Inactive (07/24/2023)  Social Connections: Moderately Integrated (08/01/2023)  Stress: No Stress Concern Present (07/24/2023)  Tobacco Use: Low Risk  (07/31/2023)  Health Literacy: Adequate Health Literacy (03/15/2023)   SDOH Interventions:     Readmission Risk Interventions     No data to display

## 2023-08-02 NOTE — Plan of Care (Signed)
 Problem: Fluid Volume: Goal: Hemodynamic stability will improve 08/02/2023 0249 by Melven Odilia PARAS, RN Outcome: Progressing 08/02/2023 0249 by Melven Odilia PARAS, RN Outcome: Progressing   Problem: Clinical Measurements: Goal: Diagnostic test results will improve 08/02/2023 0249 by Melven Odilia PARAS, RN Outcome: Progressing 08/02/2023 0249 by Melven Odilia PARAS, RN Outcome: Progressing Goal: Signs and symptoms of infection will decrease 08/02/2023 0249 by Melven Odilia PARAS, RN Outcome: Progressing 08/02/2023 0249 by Melven Odilia PARAS, RN Outcome: Progressing   Problem: Respiratory: Goal: Ability to maintain adequate ventilation will improve 08/02/2023 0249 by Melven Odilia PARAS, RN Outcome: Progressing 08/02/2023 0249 by Melven Odilia PARAS, RN Outcome: Progressing   Problem: Education: Goal: Knowledge of General Education information will improve Description: Including pain rating scale, medication(s)/side effects and non-pharmacologic comfort measures 08/02/2023 0249 by Melven Odilia PARAS, RN Outcome: Progressing 08/02/2023 0249 by Melven Odilia PARAS, RN Outcome: Progressing   Problem: Health Behavior/Discharge Planning: Goal: Ability to manage health-related needs will improve 08/02/2023 0249 by Melven Odilia PARAS, RN Outcome: Progressing 08/02/2023 0249 by Melven Odilia PARAS, RN Outcome: Progressing   Problem: Clinical Measurements: Goal: Ability to maintain clinical measurements within normal limits will improve 08/02/2023 0249 by Melven Odilia PARAS, RN Outcome: Progressing 08/02/2023 0249 by Melven Odilia PARAS, RN Outcome: Progressing Goal: Will remain free from infection 08/02/2023 0249 by Melven Odilia PARAS, RN Outcome: Progressing 08/02/2023 0249 by Melven Odilia PARAS, RN Outcome: Progressing Goal: Diagnostic test results will improve 08/02/2023 0249 by Melven Odilia PARAS, RN Outcome: Progressing 08/02/2023 0249 by Melven Odilia PARAS, RN Outcome: Progressing Goal: Respiratory complications will  improve 08/02/2023 0249 by Melven Odilia PARAS, RN Outcome: Progressing 08/02/2023 0249 by Melven Odilia PARAS, RN Outcome: Progressing Goal: Cardiovascular complication will be avoided 08/02/2023 0249 by Melven Odilia PARAS, RN Outcome: Progressing 08/02/2023 0249 by Melven Odilia PARAS, RN Outcome: Progressing   Problem: Activity: Goal: Risk for activity intolerance will decrease 08/02/2023 0249 by Melven Odilia PARAS, RN Outcome: Progressing 08/02/2023 0249 by Melven Odilia PARAS, RN Outcome: Progressing   Problem: Nutrition: Goal: Adequate nutrition will be maintained 08/02/2023 0249 by Melven Odilia PARAS, RN Outcome: Progressing 08/02/2023 0249 by Melven Odilia PARAS, RN Outcome: Progressing   Problem: Coping: Goal: Level of anxiety will decrease 08/02/2023 0249 by Melven Odilia PARAS, RN Outcome: Progressing 08/02/2023 0249 by Melven Odilia PARAS, RN Outcome: Progressing   Problem: Elimination: Goal: Will not experience complications related to bowel motility 08/02/2023 0249 by Melven Odilia PARAS, RN Outcome: Progressing 08/02/2023 0249 by Melven Odilia PARAS, RN Outcome: Progressing Goal: Will not experience complications related to urinary retention 08/02/2023 0249 by Melven Odilia PARAS, RN Outcome: Progressing 08/02/2023 0249 by Melven Odilia PARAS, RN Outcome: Progressing   Problem: Pain Managment: Goal: General experience of comfort will improve and/or be controlled 08/02/2023 0249 by Melven Odilia PARAS, RN Outcome: Progressing 08/02/2023 0249 by Melven Odilia PARAS, RN Outcome: Progressing   Problem: Safety: Goal: Ability to remain free from injury will improve 08/02/2023 0249 by Melven Odilia PARAS, RN Outcome: Progressing 08/02/2023 0249 by Melven Odilia PARAS, RN Outcome: Progressing   Problem: Skin Integrity: Goal: Risk for impaired skin integrity will decrease 08/02/2023 0249 by Melven Odilia PARAS, RN Outcome: Progressing 08/02/2023 0249 by Melven Odilia PARAS, RN Outcome: Progressing   Problem:  Education: Goal: Knowledge of disease or condition will improve 08/02/2023 0249 by Melven Odilia PARAS, RN Outcome: Progressing 08/02/2023 0249 by Melven Odilia PARAS, RN Outcome: Progressing Goal: Understanding of medication regimen will improve 08/02/2023 0249 by Melven Odilia PARAS, RN Outcome: Progressing 08/02/2023  9750 by Melven Odilia PARAS, RN Outcome: Progressing Goal: Individualized Educational Video(s) 08/02/2023 0249 by Melven Odilia PARAS, RN Outcome: Progressing 08/02/2023 0249 by Melven Odilia PARAS, RN Outcome: Progressing   Problem: Activity: Goal: Ability to tolerate increased activity will improve 08/02/2023 0249 by Melven Odilia PARAS, RN Outcome: Progressing 08/02/2023 0249 by Melven Odilia PARAS, RN Outcome: Progressing   Problem: Cardiac: Goal: Ability to achieve and maintain adequate cardiopulmonary perfusion will improve 08/02/2023 0249 by Melven Odilia PARAS, RN Outcome: Progressing 08/02/2023 0249 by Melven Odilia PARAS, RN Outcome: Progressing   Problem: Health Behavior/Discharge Planning: Goal: Ability to safely manage health-related needs after discharge will improve 08/02/2023 0249 by Melven Odilia PARAS, RN Outcome: Progressing 08/02/2023 0249 by Melven Odilia PARAS, RN Outcome: Progressing

## 2023-08-03 ENCOUNTER — Inpatient Hospital Stay

## 2023-08-03 DIAGNOSIS — L089 Local infection of the skin and subcutaneous tissue, unspecified: Secondary | ICD-10-CM | POA: Diagnosis not present

## 2023-08-03 DIAGNOSIS — S81802A Unspecified open wound, left lower leg, initial encounter: Secondary | ICD-10-CM | POA: Diagnosis not present

## 2023-08-03 LAB — RESPIRATORY PANEL BY PCR

## 2023-08-03 LAB — MRSA NEXT GEN BY PCR, NASAL: MRSA by PCR Next Gen: NOT DETECTED

## 2023-08-03 LAB — BASIC METABOLIC PANEL WITH GFR
Anion gap: 12 (ref 5–15)
BUN: 9 mg/dL (ref 8–23)
CO2: 23 mmol/L (ref 22–32)
Calcium: 8.1 mg/dL — ABNORMAL LOW (ref 8.9–10.3)
Chloride: 100 mmol/L (ref 98–111)
Creatinine, Ser: 0.49 mg/dL (ref 0.44–1.00)
GFR, Estimated: >60 mL/min (ref >60)
Glucose, Bld: 113 mg/dL — ABNORMAL HIGH (ref 70–99)
Potassium: 3 mmol/L — ABNORMAL LOW (ref 3.5–5.1)
Sodium: 135 mmol/L (ref 135–145)

## 2023-08-03 LAB — CBC
HCT: 36.6 % (ref 36.0–46.0)
Hemoglobin: 12.2 g/dL (ref 12.0–15.0)
MCH: 28.3 pg (ref 26.0–34.0)
MCHC: 33.3 g/dL (ref 30.0–36.0)
MCV: 84.9 fL (ref 80.0–100.0)
Platelets: 158 10*3/uL (ref 150–400)
RBC: 4.31 MIL/uL (ref 3.87–5.11)
RDW: 15 % (ref 11.5–15.5)
WBC: 6.7 10*3/uL (ref 4.0–10.5)
nRBC: 0 % (ref 0.0–0.2)

## 2023-08-03 LAB — RESP PANEL BY RT-PCR (RSV, FLU A&B, COVID)  RVPGX2
Influenza A by PCR: NEGATIVE
Influenza B by PCR: NEGATIVE
Resp Syncytial Virus by PCR: NEGATIVE
SARS Coronavirus 2 by RT PCR: NEGATIVE

## 2023-08-03 LAB — STREP PNEUMONIAE URINARY ANTIGEN: Strep Pneumo Urinary Antigen: NEGATIVE

## 2023-08-03 MED ORDER — SODIUM CHLORIDE 0.9 % IV SOLN
500.0000 mg | INTRAVENOUS | Status: DC
  Administered 2023-08-03 – 2023-08-04 (×2): 500 mg via INTRAVENOUS
  Filled 2023-08-03 (×3): qty 5

## 2023-08-03 MED ORDER — IPRATROPIUM-ALBUTEROL 0.5-2.5 (3) MG/3ML IN SOLN
3.0000 mL | Freq: Four times a day (QID) | RESPIRATORY_TRACT | Status: DC
  Administered 2023-08-03: 3 mL via RESPIRATORY_TRACT
  Filled 2023-08-03: qty 3

## 2023-08-03 MED ORDER — IPRATROPIUM-ALBUTEROL 0.5-2.5 (3) MG/3ML IN SOLN: 3.0000 mL | Freq: Four times a day (QID) | RESPIRATORY_TRACT | Status: DC | PRN

## 2023-08-03 MED ORDER — PROMETHAZINE HCL 25 MG PO TABS
25.0000 mg | ORAL_TABLET | Freq: Four times a day (QID) | ORAL | Status: DC | PRN
  Administered 2023-08-03 – 2023-08-06 (×7): 25 mg via ORAL
  Filled 2023-08-03 (×8): qty 1

## 2023-08-03 MED ORDER — POTASSIUM CHLORIDE CRYS ER 20 MEQ PO TBCR
40.0000 meq | EXTENDED_RELEASE_TABLET | Freq: Two times a day (BID) | ORAL | Status: AC
  Administered 2023-08-03 – 2023-08-04 (×2): 40 meq via ORAL
  Filled 2023-08-03 (×2): qty 2

## 2023-08-03 MED ORDER — SODIUM CHLORIDE 0.9 % IV SOLN
2.0000 g | Freq: Three times a day (TID) | INTRAVENOUS | Status: DC
  Administered 2023-08-03 – 2023-08-06 (×9): 2 g via INTRAVENOUS
  Filled 2023-08-03 (×11): qty 12.5

## 2023-08-03 MED ORDER — SODIUM CHLORIDE 0.9 % IV SOLN: 1.0000 g | Freq: Two times a day (BID) | INTRAVENOUS | Status: DC

## 2023-08-03 NOTE — Progress Notes (Signed)
 Radiology called to give the results of the chest X-Ray that was completed. Results are as follows: Dense airspace consolidation filling the right mid and lower lung zones, consistent with worsening bronchopneumonia.  Provider notified at 1045.

## 2023-08-03 NOTE — Plan of Care (Signed)
  Problem: Fluid Volume: Goal: Hemodynamic stability will improve Outcome: Progressing   Problem: Clinical Measurements: Goal: Diagnostic test results will improve Outcome: Progressing Goal: Signs and symptoms of infection will decrease Outcome: Progressing   Problem: Respiratory: Goal: Ability to maintain adequate ventilation will improve Outcome: Progressing   Problem: Education: Goal: Knowledge of General Education information will improve Description: Including pain rating scale, medication(s)/side effects and non-pharmacologic comfort measures Outcome: Progressing   Problem: Health Behavior/Discharge Planning: Goal: Ability to manage health-related needs will improve Outcome: Progressing   Problem: Clinical Measurements: Goal: Ability to maintain clinical measurements within normal limits will improve Outcome: Progressing Goal: Will remain free from infection Outcome: Progressing Goal: Diagnostic test results will improve Outcome: Progressing Goal: Respiratory complications will improve Outcome: Progressing Goal: Cardiovascular complication will be avoided Outcome: Progressing   Problem: Activity: Goal: Risk for activity intolerance will decrease Outcome: Progressing   Problem: Nutrition: Goal: Adequate nutrition will be maintained Outcome: Progressing   Problem: Coping: Goal: Level of anxiety will decrease Outcome: Progressing   Problem: Elimination: Goal: Will not experience complications related to bowel motility Outcome: Progressing Goal: Will not experience complications related to urinary retention Outcome: Progressing   Problem: Pain Managment: Goal: General experience of comfort will improve and/or be controlled Outcome: Progressing   Problem: Safety: Goal: Ability to remain free from injury will improve Outcome: Progressing   Problem: Skin Integrity: Goal: Risk for impaired skin integrity will decrease Outcome: Progressing   Problem:  Education: Goal: Knowledge of disease or condition will improve Outcome: Progressing Goal: Understanding of medication regimen will improve Outcome: Progressing Goal: Individualized Educational Video(s) Outcome: Progressing   Problem: Activity: Goal: Ability to tolerate increased activity will improve Outcome: Progressing   Problem: Cardiac: Goal: Ability to achieve and maintain adequate cardiopulmonary perfusion will improve Outcome: Progressing   Problem: Health Behavior/Discharge Planning: Goal: Ability to safely manage health-related needs after discharge will improve Outcome: Progressing

## 2023-08-03 NOTE — Plan of Care (Signed)
 Problem: Fluid Volume: Goal: Hemodynamic stability will improve 08/03/2023 0705 by Dante Netter D, LPN Outcome: Progressing 08/03/2023 0705 by Dante Netter D, LPN Outcome: Progressing 08/03/2023 0704 by Dante Netter D, LPN Outcome: Progressing   Problem: Clinical Measurements: Goal: Diagnostic test results will improve 08/03/2023 0705 by Dante Netter D, LPN Outcome: Progressing 08/03/2023 0705 by Dante Netter D, LPN Outcome: Progressing 08/03/2023 0704 by Dante Netter D, LPN Outcome: Progressing Goal: Signs and symptoms of infection will decrease 08/03/2023 0705 by Dante Netter D, LPN Outcome: Progressing 08/03/2023 0705 by Dante Netter D, LPN Outcome: Progressing 08/03/2023 0704 by Dante Netter D, LPN Outcome: Progressing   Problem: Respiratory: Goal: Ability to maintain adequate ventilation will improve 08/03/2023 0705 by Dante Netter D, LPN Outcome: Progressing 08/03/2023 0705 by Dante Netter D, LPN Outcome: Progressing 08/03/2023 0704 by Dante Netter D, LPN Outcome: Progressing   Problem: Education: Goal: Knowledge of General Education information will improve Description: Including pain rating scale, medication(s)/side effects and non-pharmacologic comfort measures 08/03/2023 0705 by Dante Netter D, LPN Outcome: Progressing 08/03/2023 0705 by Dante Netter D, LPN Outcome: Progressing 08/03/2023 0704 by Dante Netter D, LPN Outcome: Progressing   Problem: Health Behavior/Discharge Planning: Goal: Ability to manage health-related needs will improve 08/03/2023 0705 by Dante Netter D, LPN Outcome: Progressing 08/03/2023 0705 by Dante Netter D, LPN Outcome: Progressing 08/03/2023 0704 by Dante Netter D, LPN Outcome: Progressing   Problem: Clinical Measurements: Goal: Ability to maintain clinical measurements within normal limits will improve 08/03/2023 0705 by Dante Netter D, LPN Outcome: Progressing 08/03/2023 0705 by Dante Netter D, LPN Outcome: Progressing 08/03/2023 0704 by  Dante Netter D, LPN Outcome: Progressing Goal: Will remain free from infection 08/03/2023 0705 by Dante Netter D, LPN Outcome: Progressing 08/03/2023 0705 by Dante Netter D, LPN Outcome: Progressing 08/03/2023 0704 by Dante Netter D, LPN Outcome: Progressing Goal: Diagnostic test results will improve 08/03/2023 0705 by Dante Netter D, LPN Outcome: Progressing 08/03/2023 0705 by Dante Netter D, LPN Outcome: Progressing 08/03/2023 0704 by Dante Netter D, LPN Outcome: Progressing Goal: Respiratory complications will improve 08/03/2023 0705 by Dante Netter D, LPN Outcome: Progressing 08/03/2023 0705 by Dante Netter D, LPN Outcome: Progressing 08/03/2023 0704 by Dante Netter D, LPN Outcome: Progressing Goal: Cardiovascular complication will be avoided 08/03/2023 0705 by Dante Netter D, LPN Outcome: Progressing 08/03/2023 0705 by Dante Netter D, LPN Outcome: Progressing 08/03/2023 0704 by Dante Netter D, LPN Outcome: Progressing   Problem: Activity: Goal: Risk for activity intolerance will decrease 08/03/2023 0705 by Dante Netter D, LPN Outcome: Progressing 08/03/2023 0705 by Dante Netter D, LPN Outcome: Progressing 08/03/2023 0704 by Dante Netter D, LPN Outcome: Progressing   Problem: Nutrition: Goal: Adequate nutrition will be maintained 08/03/2023 0705 by Dante Netter D, LPN Outcome: Progressing 08/03/2023 0705 by Dante Netter D, LPN Outcome: Progressing 08/03/2023 0704 by Dante Netter D, LPN Outcome: Progressing   Problem: Coping: Goal: Level of anxiety will decrease 08/03/2023 0705 by Dante Netter D, LPN Outcome: Progressing 08/03/2023 0705 by Dante Netter D, LPN Outcome: Progressing 08/03/2023 0704 by Dante Netter D, LPN Outcome: Progressing   Problem: Elimination: Goal: Will not experience complications related to bowel motility 08/03/2023 0705 by Dante Netter D, LPN Outcome: Progressing 08/03/2023 0705 by Dante Netter D, LPN Outcome: Progressing 08/03/2023 0704 by Dante Netter D, LPN Outcome: Progressing Goal: Will not experience complications related to urinary retention 08/03/2023 0705 by Dante Netter D, LPN Outcome: Progressing 08/03/2023 0705 by Dante Netter D, LPN Outcome: Progressing 08/03/2023 0704 by Dante Netter BIRCH, LPN  Outcome: Progressing   Problem: Pain Managment: Goal: General experience of comfort will improve and/or be controlled 08/03/2023 0705 by Dante Netter D, LPN Outcome: Progressing 08/03/2023 0705 by Dante Netter D, LPN Outcome: Progressing 08/03/2023 0704 by Dante Netter D, LPN Outcome: Progressing   Problem: Safety: Goal: Ability to remain free from injury will improve 08/03/2023 0705 by Dante Netter D, LPN Outcome: Progressing 08/03/2023 0705 by Dante Netter D, LPN Outcome: Progressing 08/03/2023 0704 by Dante Netter D, LPN Outcome: Progressing   Problem: Skin Integrity: Goal: Risk for impaired skin integrity will decrease 08/03/2023 0705 by Dante Netter D, LPN Outcome: Progressing 08/03/2023 0705 by Dante Netter D, LPN Outcome: Progressing 08/03/2023 0704 by Dante Netter D, LPN Outcome: Progressing   Problem: Education: Goal: Knowledge of disease or condition will improve 08/03/2023 0705 by Dante Netter D, LPN Outcome: Progressing 08/03/2023 0705 by Dante Netter D, LPN Outcome: Progressing 08/03/2023 0704 by Dante Netter D, LPN Outcome: Progressing Goal: Understanding of medication regimen will improve 08/03/2023 0705 by Dante Netter D, LPN Outcome: Progressing 08/03/2023 0705 by Dante Netter D, LPN Outcome: Progressing 08/03/2023 0704 by Dante Netter D, LPN Outcome: Progressing Goal: Individualized Educational Video(s) 08/03/2023 0705 by Dante Netter D, LPN Outcome: Progressing 08/03/2023 0705 by Dante Netter D, LPN Outcome: Progressing 08/03/2023 0704 by Dante Netter D, LPN Outcome: Progressing   Problem: Activity: Goal: Ability to tolerate increased activity will improve 08/03/2023 0705 by Dante Netter D,  LPN Outcome: Progressing 08/03/2023 0705 by Dante Netter D, LPN Outcome: Progressing 08/03/2023 0704 by Dante Netter D, LPN Outcome: Progressing   Problem: Cardiac: Goal: Ability to achieve and maintain adequate cardiopulmonary perfusion will improve 08/03/2023 0705 by Dante Netter D, LPN Outcome: Progressing 08/03/2023 0705 by Dante Netter D, LPN Outcome: Progressing 08/03/2023 0704 by Dante Netter D, LPN Outcome: Progressing   Problem: Health Behavior/Discharge Planning: Goal: Ability to safely manage health-related needs after discharge will improve 08/03/2023 0705 by Dante Netter D, LPN Outcome: Progressing 08/03/2023 0705 by Dante Netter D, LPN Outcome: Progressing 08/03/2023 0704 by Dante Netter D, LPN Outcome: Progressing

## 2023-08-03 NOTE — Progress Notes (Signed)
 PROGRESS NOTE    Lauren [name]   FMW:[id number] DOB: May 22, 1945  DOA: 07/31/2023 Date of Service: 08/03/23 which is hospital day 2  PCP: Randeen Laine LABOR, MD    Hospital course / significant events:   HPI: Lauren [name] is a 78 y.o. female with medical history significant for Sjogren's on pilocarpine , OSA on CPAP, history of CVA, HTN, A-fib on Eliquis  (failed DCCV in 2022), with cellulitis secondary to infected wound bilateral lower legs d/t abrasions to anterior lower legs while climbing out of a pool.  Had 2 courses of outpatient antibiotics (Doxy, 6/13-6/20 then Bactrim , 6/20-6/23).  Following abx the right leg seem to be improving however she did have some draining from the wound on the left anterior leg and continues to have pain in that area.  On the day of arrival she developed a fever.   06/23: to ED in the evening. Admitted to hospitalist. Given Rocephin, Vanc.  06/24: continuing IV abx w/ Rocephin  06/25: stil lfebrile as of 02:30 and again 16:00, anticipate d/c once fever-free 24+ hours. Reports skin looks/feels improved  06/26: reports worsening cough, CXR (+)lobar pneumonia, still intermittently febrile, escalating abx and further w/u per A/P     Consultants:  none  Procedures/Surgeries: none      ASSESSMENT & PLAN:  Lobar Pneumonia new dx 08/03/23  Concern for hospital acquired pneumonia - question if developing RLL infiltrate on admission vs persistent pericardial benign finding as noted, either way definitely lobar pneumonia now  Viral panels negative for COVID, Flu, RSV CXR personally reviewed compared to on admission and CXR from 2013  Not septic, no respiratory failure  No hx tobacco/secondhand smoke or occupational respiratory exposure, no known sick contacts, no COPD/asthma  Has been on rocephin for cellulitis, Abx added - azithromycin  MRSA, S pneumo, Legionella testing.  CBC --> no leukocytosis  Duoneb Antitussive Low threshold for CT  imaging  Bilateral LE cellulitis failed outpatient po antibiotics  Sepsis parameters resolved  Continue Rocephin   Chronic atrial fibrillation with RVR  Heart rate in the 120s on presentation, likely driven by infection.  Rate controlled just with resuming home lopressor  25 mg BID cont home lopressor  cont Eliquis    Hypokalemia Replace as needed Monitor BMP  History of CVA (cerebrovascular accident) No acute issues suspected   Obstructive sleep apnea Continue CPAP nightly   Sjogren's syndrome  No acute issues   Class 2 obesity based on BMI: Body mass index is 35.36 kg/m.Lauren [name] Significantly low or high BMI is associated with higher medical risk.  Underweight - under 18  overweight - 25 to 29 obese - 30 or more Class 1 obesity: BMI of 30.0 to 34 Class 2 obesity: BMI of 35.0 to 39 Class 3 obesity: BMI of 40.0 to 49 Super Morbid Obesity: BMI 50-59 Super-super Morbid Obesity: BMI 60+ Healthy nutrition and physical activity advised as adjunct to other disease management and risk reduction treatments    DVT prophylaxis: eliquis  IV fluids: no continuous IV fluids  Nutrition: regular Central lines / other devices: none  Code Status: DNR ACP documentation reviewed:  none on file in [facility]  TOC needs: none Medical barriers to dispo: fever. Expected medical readiness for discharge tomorrow.              Subjective / Brief ROS:  Patient reports cough Denies CP/SOB.  Pain controlled.  Denies new weakness.  Tolerating diet.    Family Communication: none at this time     Objective Findings:  Vitals:  08/02/23 2016 08/03/23 0424 08/03/23 0805 08/03/23 1527  BP: 114/64 125/74 124/64 102/66  Pulse: 95 100 (!) 110 100  Resp: 18 18 18 17   Temp: 98.8 F (37.1 C) 99.9 F (37.7 C) 99.8 F (37.7 C) 100.1 F (37.8 C)  TempSrc:   Oral   SpO2: 95% 96% 95% 94%  Weight:      Height:        Intake/Output Summary (Last 24 hours) at 08/03/2023 1620 Last data filed  at 08/03/2023 1024 Gross per 24 hour  Intake 432.23 ml  Output --  Net 432.23 ml   Filed Weights   08/01/23 0200  Weight: 105.5 kg    Examination:  Physical Exam Constitutional:      General: She is not in acute distress.  Cardiovascular:     Rate and Rhythm: Normal rate and regular rhythm.  Pulmonary:     Effort: Pulmonary effort is normal. No respiratory distress.     Breath sounds: Rhonchi (R lower lung fields) present.   Skin:    General: Skin is warm and dry.     Comments: Scabbed-over longitudinal abrasions on anterior lower extremities, minimal erythema/warmth (Pt reports improvement in redness and pain)    Neurological:     General: No focal deficit present.     Mental Status: She is alert and oriented to person, place, and time.   Psychiatric:        Mood and Affect: Mood normal.        Behavior: Behavior normal.          Scheduled Medications:   apixaban   5 mg Oral BID   DULoxetine   30 mg Oral Daily   metoprolol  tartrate  25 mg Oral BID    Continuous Infusions:  azithromycin  500 mg (08/03/23 1357)   ceFEPime (MAXIPIME) IV      PRN Medications:  acetaminophen  **OR** acetaminophen , guaiFENesin -dextromethorphan, HYDROcodone-acetaminophen , ipratropium-albuterol , prochlorperazine, promethazine   Antimicrobials from admission:  Anti-infectives (From admission, onward)    Start     Dose/Rate Route Frequency Ordered Stop   08/03/23 1715  ceFEPIme (MAXIPIME) 1 g in sodium chloride  0.9 % 100 mL IVPB        1 g 200 mL/hr over 30 Minutes Intravenous Every 12 hours 08/03/23 1618     08/03/23 1230  azithromycin  (ZITHROMAX ) 500 mg in sodium chloride  0.9 % 250 mL IVPB        500 mg 250 mL/hr over 60 Minutes Intravenous Every 24 hours 08/03/23 1127 08/08/23 1229   08/01/23 1800  vancomycin (VANCOREADY) IVPB 1500 mg/300 mL  Status:  Discontinued        1,500 mg 150 mL/hr over 120 Minutes Intravenous Every 24 hours 07/31/23 2155 08/01/23 1032   08/01/23 0600   cefTRIAXone (ROCEPHIN) 2 g in sodium chloride  0.9 % 100 mL IVPB  Status:  Discontinued        2 g 200 mL/hr over 30 Minutes Intravenous Every 24 hours 07/31/23 2140 08/03/23 1618   07/31/23 2000  vancomycin (VANCOREADY) IVPB 2000 mg/400 mL        2,000 mg 200 mL/hr over 120 Minutes Intravenous  Once 07/31/23 1934 07/31/23 2220   07/31/23 1945  ceFEPIme (MAXIPIME) 2 g in sodium chloride  0.9 % 100 mL IVPB        2 g 200 mL/hr over 30 Minutes Intravenous  Once 07/31/23 1934 07/31/23 2052           Data Reviewed:  I have personally reviewed the following.Lauren [name]  CBC: Recent Labs  Lab 07/31/23 1936 08/01/23 0413 08/03/23 1146  WBC 10.0 7.9 6.7  NEUTROABS 7.8*  --   --   HGB 13.8 12.9 12.2  HCT 40.7 41.1 36.6  MCV 84.1 88.8 84.9  PLT 206 180 158   Basic Metabolic Panel: Recent Labs  Lab 07/31/23 1936 08/01/23 0413 08/03/23 1146  NA 133* 138 135  K 3.7 3.6 3.0*  CL 99 108 100  CO2 20* 21* 23  GLUCOSE 132* 107* 113*  BUN 14 13 9   CREATININE 0.61 0.52 0.49  CALCIUM  8.5* 8.3* 8.1*   GFR: Estimated Creatinine Clearance: 73.7 mL/min (by C-G formula based on SCr of 0.49 mg/dL). Liver Function Tests: Recent Labs  Lab 07/31/23 1936  AST 17  ALT 11  ALKPHOS 75  BILITOT 0.8  PROT 7.4  ALBUMIN 3.6   No results for input(s): LIPASE, AMYLASE in the last 168 hours. No results for input(s): AMMONIA in the last 168 hours. Coagulation Profile: Recent Labs  Lab 07/31/23 1936  INR 1.2   Cardiac Enzymes: No results for input(s): CKTOTAL, CKMB, CKMBINDEX, TROPONINI in the last 168 hours. BNP (last 3 results) No results for input(s): PROBNP in the last 8760 hours. HbA1C: No results for input(s): HGBA1C in the last 72 hours. CBG: No results for input(s): GLUCAP in the last 168 hours. Lipid Profile: No results for input(s): CHOL, HDL, LDLCALC, TRIG, CHOLHDL, LDLDIRECT in the last 72 hours. Thyroid  Function Tests: No results for input(s):  TSH, T4TOTAL, FREET4, T3FREE, THYROIDAB in the last 72 hours. Anemia Panel: No results for input(s): VITAMINB12, FOLATE, FERRITIN, TIBC, IRON, RETICCTPCT in the last 72 hours. Most Recent Urinalysis On File:     Component Value Date/Time   COLORURINE YELLOW (A) 07/31/2023 2011   APPEARANCEUR CLEAR (A) 07/31/2023 2011   LABSPEC 1.025 07/31/2023 2011   PHURINE 5.0 07/31/2023 2011   GLUCOSEU NEGATIVE 07/31/2023 2011   HGBUR NEGATIVE 07/31/2023 2011   BILIRUBINUR NEGATIVE 07/31/2023 2011   BILIRUBINUR negative 09/11/2019 1324   KETONESUR 5 (A) 07/31/2023 2011   PROTEINUR NEGATIVE 07/31/2023 2011   UROBILINOGEN 0.2 09/11/2019 1324   NITRITE NEGATIVE 07/31/2023 2011   LEUKOCYTESUR NEGATIVE 07/31/2023 2011   Sepsis Labs: @LABRCNTIP (procalcitonin:4,lacticidven:4) Microbiology: Recent Results (from the past 240 hours)  Blood Culture (routine x 2)     Status: None (Preliminary result)   Collection Time: 07/31/23  7:36 PM   Specimen: BLOOD  Result Value Ref Range Status   Specimen Description BLOOD BLOOD RIGHT ARM  Final   Special Requests   Final    BOTTLES DRAWN AEROBIC AND ANAEROBIC Blood Culture results may not be optimal due to an inadequate volume of blood received in culture bottles   Culture   Final    NO GROWTH 3 DAYS Performed at [facility], [address], [place], [place] [address]    Report Status PENDING  Incomplete  Blood Culture (routine x 2)     Status: None (Preliminary result)   Collection Time: 07/31/23  7:36 PM   Specimen: BLOOD  Result Value Ref Range Status   Specimen Description BLOOD BLOOD RIGHT ARM  Final   Special Requests   Final    BOTTLES DRAWN AEROBIC AND ANAEROBIC Blood Culture results may not be optimal due to an inadequate volume of blood received in culture bottles   Culture   Final    NO GROWTH 3 DAYS Performed at [facility], [address], [place], [place] [address]  Report Status PENDING   Incomplete  Resp panel by RT-PCR (RSV, Flu A&B, Covid) Anterior Nasal Swab     Status: None   Collection Time: 08/03/23 10:28 AM   Specimen: Anterior Nasal Swab  Result Value Ref Range Status   SARS Coronavirus 2 by RT PCR NEGATIVE NEGATIVE Final    Comment: (NOTE) SARS-CoV-2 target nucleic acids are NOT DETECTED.  The SARS-CoV-2 RNA is generally detectable in upper respiratory specimens during the acute phase of infection. The lowest concentration of SARS-CoV-2 viral copies this assay can detect is 138 copies/mL. A negative result does not preclude SARS-Cov-2 infection and should not be used as the sole basis for treatment or other patient management decisions. A negative result may occur with  improper specimen collection/handling, submission of specimen other than nasopharyngeal swab, presence of viral mutation(s) within the areas targeted by this assay, and inadequate number of viral copies(<138 copies/mL). A negative result must be combined with clinical observations, patient history, and epidemiological information. The expected result is Negative.  Fact Sheet for Patients:  BloggerCourse.com  Fact Sheet for Healthcare Providers:  SeriousBroker.it  This test is no t yet approved or cleared by the [place]  FDA and  has been authorized for detection and/or diagnosis of SARS-CoV-2 by FDA under an Emergency Use Authorization (EUA). This EUA will remain  in effect (meaning this test can be used) for the duration of the COVID-19 declaration under Section 564(b)(1) of the Act, 21 U.S.C.section 360bbb-3(b)(1), unless the authorization is terminated  or revoked sooner.       Influenza A by PCR NEGATIVE NEGATIVE Final   Influenza B by PCR NEGATIVE NEGATIVE Final    Comment: (NOTE) The Xpert Xpress SARS-CoV-2/FLU/RSV plus assay is intended as an aid in the diagnosis of influenza from Nasopharyngeal swab specimens and should  not be used as a sole basis for treatment. Nasal washings and aspirates are unacceptable for Xpert Xpress SARS-CoV-2/FLU/RSV testing.  Fact Sheet for Patients: BloggerCourse.com  Fact Sheet for Healthcare Providers: SeriousBroker.it  This test is not yet approved or cleared by the [place]  FDA and has been authorized for detection and/or diagnosis of SARS-CoV-2 by FDA under an Emergency Use Authorization (EUA). This EUA will remain in effect (meaning this test can be used) for the duration of the COVID-19 declaration under Section 564(b)(1) of the Act, 21 U.S.C. section 360bbb-3(b)(1), unless the authorization is terminated or revoked.     Resp Syncytial Virus by PCR NEGATIVE NEGATIVE Final    Comment: (NOTE) Fact Sheet for Patients: BloggerCourse.com  Fact Sheet for Healthcare Providers: SeriousBroker.it  This test is not yet approved or cleared by the [place]  FDA and has been authorized for detection and/or diagnosis of SARS-CoV-2 by FDA under an Emergency Use Authorization (EUA). This EUA will remain in effect (meaning this test can be used) for the duration of the COVID-19 declaration under Section 564(b)(1) of the Act, 21 U.S.C. section 360bbb-3(b)(1), unless the authorization is terminated or revoked.  Performed at [facility], [address] [place], [place] [address]   MRSA Next Gen by PCR, Nasal     Status: None   Collection Time: 08/03/23  1:59 PM   Specimen: Nasal Mucosa; Nasal Swab  Result Value Ref Range Status   MRSA by PCR Next Gen NOT DETECTED NOT DETECTED Final    Comment: (NOTE) The GeneXpert MRSA Assay (FDA approved for NASAL specimens only), is one component of a comprehensive MRSA colonization surveillance program. It is not intended to  diagnose MRSA infection nor to guide or monitor treatment for MRSA infections. Test  performance is not FDA approved in patients less than 81 years old. Performed at [facility], [address], [place], [place] [address]       Radiology Studies last 3 days: [facility] Chest [facility] 1 View Result Date: 08/03/2023 CLINICAL DATA:  10031 Cough 10031 EXAM: PORTABLE CHEST - 1 VIEW COMPARISON:  July 31, 2023 FINDINGS: Dense airspace consolidation filling the right mid and lower lung zones. No large pleural effusion or pneumothorax. No cardiomegaly. Aortic atherosclerosis. No acute fracture or destructive lesions. Multilevel thoracic osteophytosis. IMPRESSION: Dense airspace consolidation filling the right mid and lower lung zones, consistent with worsening bronchopneumonia. A 2 view chest radiograph in 6-12 weeks is recommended to document resolution once treatment is complete. These results will be called to the ordering clinician or representative by the Radiologist Assistant and communication documented in the PACS or [employer]. Electronically Signed   By: Rogelia Myers M.D.   On: 08/03/2023 10:36   DG Chest Port 1 View Result Date: 07/31/2023 CLINICAL DATA:  Fever. EXAM: PORTABLE CHEST 1 VIEW COMPARISON:  08/22/2011. FINDINGS: The heart size and mediastinal contours are within normal limits. No pneumothorax or pleural effusion. Stable medial right basilar density likely represents pericardial fat and is unchanged since 2013. Lungs are otherwise clear. Aorta is calcified. The visualized skeletal structures are unremarkable. IMPRESSION: No active disease. Electronically Signed   By: Fonda Field M.D.   On: 07/31/2023 20:05        Laneta Blunt, DO [facility] 08/03/2023, 4:20 PM    Dictation software may have been used to generate the above note. Typos may occur and escape review in typed/dictated notes. Please contact Dr Blunt directly for clarity if needed.  Staff may message me via secure chat in Epic  but this may not receive an immediate  response,  please page me for urgent matters!  If 7PM-7AM, please contact night coverage [email or web address]

## 2023-08-03 NOTE — Plan of Care (Signed)
 Problem: Fluid Volume: Goal: Hemodynamic stability will improve 08/03/2023 0705 by Dante Netter D, LPN Outcome: Progressing 08/03/2023 0704 by Dante Netter D, LPN Outcome: Progressing   Problem: Clinical Measurements: Goal: Diagnostic test results will improve 08/03/2023 0705 by Dante Netter D, LPN Outcome: Progressing 08/03/2023 0704 by Dante Netter D, LPN Outcome: Progressing Goal: Signs and symptoms of infection will decrease 08/03/2023 0705 by Dante Netter D, LPN Outcome: Progressing 08/03/2023 0704 by Dante Netter D, LPN Outcome: Progressing   Problem: Respiratory: Goal: Ability to maintain adequate ventilation will improve 08/03/2023 0705 by Dante Netter D, LPN Outcome: Progressing 08/03/2023 0704 by Dante Netter D, LPN Outcome: Progressing   Problem: Education: Goal: Knowledge of General Education information will improve Description: Including pain rating scale, medication(s)/side effects and non-pharmacologic comfort measures 08/03/2023 0705 by Dante Netter D, LPN Outcome: Progressing 08/03/2023 0704 by Dante Netter D, LPN Outcome: Progressing   Problem: Health Behavior/Discharge Planning: Goal: Ability to manage health-related needs will improve 08/03/2023 0705 by Dante Netter D, LPN Outcome: Progressing 08/03/2023 0704 by Dante Netter D, LPN Outcome: Progressing   Problem: Clinical Measurements: Goal: Ability to maintain clinical measurements within normal limits will improve 08/03/2023 0705 by Dante Netter D, LPN Outcome: Progressing 08/03/2023 0704 by Dante Netter D, LPN Outcome: Progressing Goal: Will remain free from infection 08/03/2023 0705 by Dante Netter D, LPN Outcome: Progressing 08/03/2023 0704 by Dante Netter D, LPN Outcome: Progressing Goal: Diagnostic test results will improve 08/03/2023 0705 by Dante Netter D, LPN Outcome: Progressing 08/03/2023 0704 by Dante Netter D, LPN Outcome: Progressing Goal: Respiratory complications will improve 08/03/2023  0705 by Dante Netter D, LPN Outcome: Progressing 08/03/2023 0704 by Dante Netter D, LPN Outcome: Progressing Goal: Cardiovascular complication will be avoided 08/03/2023 0705 by Dante Netter D, LPN Outcome: Progressing 08/03/2023 0704 by Dante Netter D, LPN Outcome: Progressing   Problem: Activity: Goal: Risk for activity intolerance will decrease 08/03/2023 0705 by Dante Netter D, LPN Outcome: Progressing 08/03/2023 0704 by Dante Netter D, LPN Outcome: Progressing   Problem: Nutrition: Goal: Adequate nutrition will be maintained 08/03/2023 0705 by Dante Netter D, LPN Outcome: Progressing 08/03/2023 0704 by Dante Netter D, LPN Outcome: Progressing   Problem: Coping: Goal: Level of anxiety will decrease 08/03/2023 0705 by Dante Netter D, LPN Outcome: Progressing 08/03/2023 0704 by Dante Netter D, LPN Outcome: Progressing   Problem: Elimination: Goal: Will not experience complications related to bowel motility 08/03/2023 0705 by Dante Netter D, LPN Outcome: Progressing 08/03/2023 0704 by Dante Netter D, LPN Outcome: Progressing Goal: Will not experience complications related to urinary retention 08/03/2023 0705 by Dante Netter D, LPN Outcome: Progressing 08/03/2023 0704 by Dante Netter D, LPN Outcome: Progressing   Problem: Pain Managment: Goal: General experience of comfort will improve and/or be controlled 08/03/2023 0705 by Dante Netter D, LPN Outcome: Progressing 08/03/2023 0704 by Dante Netter D, LPN Outcome: Progressing   Problem: Safety: Goal: Ability to remain free from injury will improve 08/03/2023 0705 by Dante Netter D, LPN Outcome: Progressing 08/03/2023 0704 by Dante Netter D, LPN Outcome: Progressing   Problem: Skin Integrity: Goal: Risk for impaired skin integrity will decrease 08/03/2023 0705 by Dante Netter D, LPN Outcome: Progressing 08/03/2023 0704 by Dante Netter D, LPN Outcome: Progressing   Problem: Education: Goal: Knowledge of disease or condition  will improve 08/03/2023 0705 by Dante Netter D, LPN Outcome: Progressing 08/03/2023 0704 by Dante Netter D, LPN Outcome: Progressing Goal: Understanding of medication regimen will improve 08/03/2023 0705 by Dante Netter D, LPN Outcome: Progressing 08/03/2023  [id number] by Dante Netter D, LPN Outcome: Progressing Goal: Individualized Educational Video(s) 08/03/2023 0705 by Dante Netter D, LPN Outcome: Progressing 08/03/2023 0704 by Dante Netter D, LPN Outcome: Progressing   Problem: Activity: Goal: Ability to tolerate increased activity will improve 08/03/2023 0705 by Dante Netter D, LPN Outcome: Progressing 08/03/2023 0704 by Dante Netter D, LPN Outcome: Progressing   Problem: Cardiac: Goal: Ability to achieve and maintain adequate cardiopulmonary perfusion will improve 08/03/2023 0705 by Dante Netter D, LPN Outcome: Progressing 08/03/2023 0704 by Dante Netter D, LPN Outcome: Progressing   Problem: Health Behavior/Discharge Planning: Goal: Ability to safely manage health-related needs after discharge will improve 08/03/2023 0705 by Dante Netter D, LPN Outcome: Progressing 08/03/2023 0704 by Dante Netter D, LPN Outcome: Progressing

## 2023-08-03 NOTE — Progress Notes (Signed)
 PHARMACY NOTE:  ANTIMICROBIAL RENAL DOSAGE ADJUSTMENT  Current antimicrobial regimen includes a mismatch between antimicrobial dosage and estimated renal function.  As per policy approved by the Pharmacy & Therapeutics and Medical Executive Committees, the antimicrobial dosage will be adjusted accordingly.  Current antimicrobial dosage:  cefepime 1g IV every 12 hours  Indication: HCAP  Renal Function:  Estimated Creatinine Clearance: 73.7 mL/min (by C-G formula based on SCr of 0.49 mg/dL). []      On intermittent HD, scheduled: []      On CRRT    Antimicrobial dosage has been changed to:  Cefepime 2g IV every 8 hours  Additional comments:   Thank you for allowing pharmacy to be a part of this patient's care.  Estill CHRISTELLA Lutes, PharmD, BCPS Clinical Pharmacist 08/03/2023 4:21 PM

## 2023-08-04 DIAGNOSIS — S81802A Unspecified open wound, left lower leg, initial encounter: Secondary | ICD-10-CM | POA: Diagnosis not present

## 2023-08-04 DIAGNOSIS — L089 Local infection of the skin and subcutaneous tissue, unspecified: Secondary | ICD-10-CM | POA: Diagnosis not present

## 2023-08-04 LAB — LEGIONELLA PNEUMOPHILA SEROGP 1 UR AG: L. pneumophila Serogp 1 Ur Ag: NEGATIVE

## 2023-08-04 NOTE — Progress Notes (Signed)
 PROGRESS NOTE    Lauren [name]   FMW:[id number] DOB: 22-Aug-1945  DOA: 07/31/2023 Date of Service: 08/04/23 which is hospital day 3  PCP: Randeen Laine LABOR, MD    Hospital course / significant events:   HPI: Lauren [name] is a 78 y.o. female with medical history significant for Sjogren's on pilocarpine , OSA on CPAP, history of CVA, HTN, A-fib on Eliquis  (failed DCCV in 2022), with cellulitis secondary to infected wound bilateral lower legs d/t abrasions to anterior lower legs while climbing out of a pool.  Had 2 courses of outpatient antibiotics (Doxy, 6/13-6/20 then Bactrim , 6/20-6/23).  Following abx the right leg seem to be improving however she did have some draining from the wound on the left anterior leg and continues to have pain in that area.  On the day of arrival she developed a fever.   06/23: to ED in the evening. Admitted to hospitalist. Given Rocephin , Vanc.  06/24: continuing IV abx w/ Rocephin   06/25: stil lfebrile as of 02:30 and again 16:00, anticipate d/c once fever-free 24+ hours. Reports skin looks/feels improved  06/26: reports worsening cough, CXR (+)lobar pneumonia and resp viral (+)adenovirus, still intermittently febrile, escalating abx 06/27: continue IV abx, repeat CXR in AM     Consultants:  none  Procedures/Surgeries: none      ASSESSMENT & PLAN:  Lobar Pneumonia new dx 08/03/23  Concern for hospital acquired pneumonia - question if CXR on admission was in fact showing developing RLL infiltrate vs persistent pericardial benign finding as noted, either way definitely lobar pneumonia now  (+)Adenovirus  CXR personally reviewed compared to on admission and CXR from 2013  Not septic, no respiratory failure  No hx tobacco/secondhand smoke or occupational respiratory exposure, no known sick contacts, no COPD/asthma  Cefepime  for possible HCAP Duoneb Antitussive Incentive spirometry, flutter valve Legionella testing pending  Sputum culture pending   Low threshold for CT imaging if not improving but pt has been stable/improving   Did not get blood cultures as not meeting sepsis criteria and already received IV abx, would obtain if persistent fever despite abx change  Bilateral LE cellulitis failed outpatient po antibiotics  Sepsis parameters resolved  Skin is improving  Rocephin  switched to Cefepime  as above   Chronic atrial fibrillation with RVR  Heart rate in the 120s on presentation, likely driven by infection.  Rate controlled with resuming home lopressor  25 mg BID cont home lopressor  cont Eliquis    Hypokalemia Replace as needed Monitor BMP  History of CVA (cerebrovascular accident) No acute issues suspected   Obstructive sleep apnea Continue CPAP nightly / when asleep    Sjogren's syndrome  No acute issues   Class 2 obesity based on BMI: Body mass index is 35.36 kg/m.SABRA Significantly low or high BMI is associated with higher medical risk.  Underweight - under 18  overweight - 25 to 29 obese - 30 or more Class 1 obesity: BMI of 30.0 to 34 Class 2 obesity: BMI of 35.0 to 39 Class 3 obesity: BMI of 40.0 to 49 Super Morbid Obesity: BMI 50-59 Super-super Morbid Obesity: BMI 60+ Healthy nutrition and physical activity advised as adjunct to other disease management and risk reduction treatments    DVT prophylaxis: eliquis  IV fluids: no continuous IV fluids  Nutrition: regular Central lines / other devices: none  Code Status: DNR ACP documentation reviewed:  none on file in [facility]  TOC needs: none Medical barriers to dispo: IV abx  Subjective / Brief ROS:  Patient reports cough is a bit better today Fatigued but this is also some improved  Denies CP/SOB.  Pain controlled.  Denies new weakness.  Tolerating diet.    Family Communication: called pt's daughter Benton 08/04/23 3:03 PM     Objective Findings:  Vitals:   08/03/23 2019 08/04/23 0312 08/04/23 0820 08/04/23 1440  BP:  121/79 118/81 101/71 115/74  Pulse: 97 92 87 88  Resp: 17 20 16 18   Temp: 99.5 F (37.5 C) 99.2 F (37.3 C) 98.5 F (36.9 C) 98 F (36.7 C)  TempSrc: Oral Oral    SpO2: 94% 96% 94% 99%  Weight:      Height:        Intake/Output Summary (Last 24 hours) at 08/04/2023 1447 Last data filed at 08/04/2023 1050 Gross per 24 hour  Intake 910 ml  Output 150 ml  Net 760 ml   Filed Weights   08/01/23 0200  Weight: 105.5 kg    Examination:  Physical Exam Constitutional:      General: She is not in acute distress.  Cardiovascular:     Rate and Rhythm: Normal rate. Rhythm irregular.  Pulmonary:     Effort: Pulmonary effort is normal. No respiratory distress.     Breath sounds: Rhonchi (R lower lung fields sounds som eimproved compared to yesterday) present.   Skin:    General: Skin is warm and dry.     Comments: Scabbed-over longitudinal abrasions on anterior lower extremities, minimal erythema/warmth (Pt reports improvement in redness and pain)    Neurological:     General: No focal deficit present.     Mental Status: She is alert and oriented to person, place, and time.   Psychiatric:        Mood and Affect: Mood normal.        Behavior: Behavior normal.          Scheduled Medications:   apixaban   5 mg Oral BID   DULoxetine   30 mg Oral Daily   metoprolol  tartrate  25 mg Oral BID    Continuous Infusions:  azithromycin  500 mg (08/04/23 1236)   ceFEPime  (MAXIPIME ) IV 2 g (08/04/23 1127)    PRN Medications:  acetaminophen  **OR** acetaminophen , guaiFENesin -dextromethorphan , HYDROcodone -acetaminophen , ipratropium-albuterol , prochlorperazine , promethazine   Antimicrobials from admission:  Anti-infectives (From admission, onward)    Start     Dose/Rate Route Frequency Ordered Stop   08/03/23 1800  ceFEPIme  (MAXIPIME ) 2 g in sodium chloride  0.9 % 100 mL IVPB        2 g 200 mL/hr over 30 Minutes Intravenous Every 8 hours 08/03/23 1621     08/03/23 1715  ceFEPIme   (MAXIPIME ) 1 g in sodium chloride  0.9 % 100 mL IVPB  Status:  Discontinued        1 g 200 mL/hr over 30 Minutes Intravenous Every 12 hours 08/03/23 1618 08/03/23 1620   08/03/23 1230  azithromycin  (ZITHROMAX ) 500 mg in sodium chloride  0.9 % 250 mL IVPB        500 mg 250 mL/hr over 60 Minutes Intravenous Every 24 hours 08/03/23 1127 08/08/23 1229   08/01/23 1800  vancomycin  (VANCOREADY) IVPB 1500 mg/300 mL  Status:  Discontinued        1,500 mg 150 mL/hr over 120 Minutes Intravenous Every 24 hours 07/31/23 2155 08/01/23 1032   08/01/23 0600  cefTRIAXone  (ROCEPHIN ) 2 g in sodium chloride  0.9 % 100 mL IVPB  Status:  Discontinued  2 g 200 mL/hr over 30 Minutes Intravenous Every 24 hours 07/31/23 2140 08/03/23 1618   07/31/23 2000  vancomycin  (VANCOREADY) IVPB 2000 mg/400 mL        2,000 mg 200 mL/hr over 120 Minutes Intravenous  Once 07/31/23 1934 07/31/23 2220   07/31/23 1945  ceFEPIme  (MAXIPIME ) 2 g in sodium chloride  0.9 % 100 mL IVPB        2 g 200 mL/hr over 30 Minutes Intravenous  Once 07/31/23 1934 07/31/23 2052           Data Reviewed:  I have personally reviewed the following...  CBC: Recent Labs  Lab 07/31/23 1936 08/01/23 0413 08/03/23 1146  WBC 10.0 7.9 6.7  NEUTROABS 7.8*  --   --   HGB 13.8 12.9 12.2  HCT 40.7 41.1 36.6  MCV 84.1 88.8 84.9  PLT 206 180 158   Basic Metabolic Panel: Recent Labs  Lab 07/31/23 1936 08/01/23 0413 08/03/23 1146  NA 133* 138 135  K 3.7 3.6 3.0*  CL 99 108 100  CO2 20* 21* 23  GLUCOSE 132* 107* 113*  BUN 14 13 9   CREATININE 0.61 0.52 0.49  CALCIUM  8.5* 8.3* 8.1*   GFR: Estimated Creatinine Clearance: 73.7 mL/min (by C-G formula based on SCr of 0.49 mg/dL). Liver Function Tests: Recent Labs  Lab 07/31/23 1936  AST 17  ALT 11  ALKPHOS 75  BILITOT 0.8  PROT 7.4  ALBUMIN 3.6   No results for input(s): LIPASE, AMYLASE in the last 168 hours. No results for input(s): AMMONIA in the last 168  hours. Coagulation Profile: Recent Labs  Lab 07/31/23 1936  INR 1.2   Cardiac Enzymes: No results for input(s): CKTOTAL, CKMB, CKMBINDEX, TROPONINI in the last 168 hours. BNP (last 3 results) No results for input(s): PROBNP in the last 8760 hours. HbA1C: No results for input(s): HGBA1C in the last 72 hours. CBG: No results for input(s): GLUCAP in the last 168 hours. Lipid Profile: No results for input(s): CHOL, HDL, LDLCALC, TRIG, CHOLHDL, LDLDIRECT in the last 72 hours. Thyroid  Function Tests: No results for input(s): TSH, T4TOTAL, FREET4, T3FREE, THYROIDAB in the last 72 hours. Anemia Panel: No results for input(s): VITAMINB12, FOLATE, FERRITIN, TIBC, IRON, RETICCTPCT in the last 72 hours. Most Recent Urinalysis On File:     Component Value Date/Time   COLORURINE YELLOW (A) 07/31/2023 2011   APPEARANCEUR CLEAR (A) 07/31/2023 2011   LABSPEC 1.025 07/31/2023 2011   PHURINE 5.0 07/31/2023 2011   GLUCOSEU NEGATIVE 07/31/2023 2011   HGBUR NEGATIVE 07/31/2023 2011   BILIRUBINUR NEGATIVE 07/31/2023 2011   BILIRUBINUR negative 09/11/2019 1324   KETONESUR 5 (A) 07/31/2023 2011   PROTEINUR NEGATIVE 07/31/2023 2011   UROBILINOGEN 0.2 09/11/2019 1324   NITRITE NEGATIVE 07/31/2023 2011   LEUKOCYTESUR NEGATIVE 07/31/2023 2011   Sepsis Labs: @LABRCNTIP (procalcitonin:4,lacticidven:4) Microbiology: Recent Results (from the past 240 hours)  Blood Culture (routine x 2)     Status: None (Preliminary result)   Collection Time: 07/31/23  7:36 PM   Specimen: BLOOD  Result Value Ref Range Status   Specimen Description BLOOD BLOOD RIGHT ARM  Final   Special Requests   Final    BOTTLES DRAWN AEROBIC AND ANAEROBIC Blood Culture results may not be optimal due to an inadequate volume of blood received in culture bottles   Culture   Final    NO GROWTH 4 DAYS Performed at [facility], [address] [place], [place] [address]     Report Status  PENDING  Incomplete  Blood Culture (routine x 2)     Status: None (Preliminary result)   Collection Time: 07/31/23  7:36 PM   Specimen: BLOOD  Result Value Ref Range Status   Specimen Description BLOOD BLOOD RIGHT ARM  Final   Special Requests   Final    BOTTLES DRAWN AEROBIC AND ANAEROBIC Blood Culture results may not be optimal due to an inadequate volume of blood received in culture bottles   Culture   Final    NO GROWTH 4 DAYS Performed at [facility], [address], [place], [place] [address]    Report Status PENDING  Incomplete  Resp panel by RT-PCR (RSV, Flu A&B, Covid) Anterior Nasal Swab     Status: None   Collection Time: 08/03/23 10:28 AM   Specimen: Anterior Nasal Swab  Result Value Ref Range Status   SARS Coronavirus 2 by RT PCR NEGATIVE NEGATIVE Final    Comment: (NOTE) SARS-CoV-2 target nucleic acids are NOT DETECTED.  The SARS-CoV-2 RNA is generally detectable in upper respiratory specimens during the acute phase of infection. The lowest concentration of SARS-CoV-2 viral copies this assay can detect is 138 copies/mL. A negative result does not preclude SARS-Cov-2 infection and should not be used as the sole basis for treatment or other patient management decisions. A negative result may occur with  improper specimen collection/handling, submission of specimen other than nasopharyngeal swab, presence of viral mutation(s) within the areas targeted by this assay, and inadequate number of viral copies(<138 copies/mL). A negative result must be combined with clinical observations, patient history, and epidemiological information. The expected result is Negative.  Fact Sheet for Patients:  BloggerCourse.com  Fact Sheet for Healthcare Providers:  SeriousBroker.it  This test is no t yet approved or cleared by the [place]  FDA and  has been authorized for detection and/or diagnosis of  SARS-CoV-2 by FDA under an Emergency Use Authorization (EUA). This EUA will remain  in effect (meaning this test can be used) for the duration of the COVID-19 declaration under Section 564(b)(1) of the Act, 21 U.S.C.section 360bbb-3(b)(1), unless the authorization is terminated  or revoked sooner.       Influenza A by PCR NEGATIVE NEGATIVE Final   Influenza B by PCR NEGATIVE NEGATIVE Final    Comment: (NOTE) The Xpert Xpress SARS-CoV-2/FLU/RSV plus assay is intended as an aid in the diagnosis of influenza from Nasopharyngeal swab specimens and should not be used as a sole basis for treatment. Nasal washings and aspirates are unacceptable for Xpert Xpress SARS-CoV-2/FLU/RSV testing.  Fact Sheet for Patients: BloggerCourse.com  Fact Sheet for Healthcare Providers: SeriousBroker.it  This test is not yet approved or cleared by the [place]  FDA and has been authorized for detection and/or diagnosis of SARS-CoV-2 by FDA under an Emergency Use Authorization (EUA). This EUA will remain in effect (meaning this test can be used) for the duration of the COVID-19 declaration under Section 564(b)(1) of the Act, 21 U.S.C. section 360bbb-3(b)(1), unless the authorization is terminated or revoked.     Resp Syncytial Virus by PCR NEGATIVE NEGATIVE Final    Comment: (NOTE) Fact Sheet for Patients: BloggerCourse.com  Fact Sheet for Healthcare Providers: SeriousBroker.it  This test is not yet approved or cleared by the [place]  FDA and has been authorized for detection and/or diagnosis of SARS-CoV-2 by FDA under an Emergency Use Authorization (EUA). This EUA will remain in effect (meaning this test can be used) for the duration of the COVID-19 declaration under Section  564(b)(1) of the Act, 21 U.S.C. section 360bbb-3(b)(1), unless the authorization is terminated  or revoked.  Performed at [facility], [address] [place], [place] [address]   Respiratory (~20 pathogens) panel by PCR     Status: Abnormal   Collection Time: 08/03/23 10:28 AM   Specimen: Nasopharyngeal Swab; Respiratory  Result Value Ref Range Status   Adenovirus DETECTED (A) NOT DETECTED Final   Coronavirus 229E NOT DETECTED NOT DETECTED Final    Comment: (NOTE) The Coronavirus on the Respiratory Panel, DOES NOT test for the novel  Coronavirus (2019 nCoV)    Coronavirus HKU1 NOT DETECTED NOT DETECTED Final   Coronavirus NL63 NOT DETECTED NOT DETECTED Final   Coronavirus OC43 NOT DETECTED NOT DETECTED Final   Metapneumovirus NOT DETECTED NOT DETECTED Final   Rhinovirus / Enterovirus NOT DETECTED NOT DETECTED Final   Influenza A NOT DETECTED NOT DETECTED Final   Influenza B NOT DETECTED NOT DETECTED Final   Parainfluenza Virus 1 NOT DETECTED NOT DETECTED Final   Parainfluenza Virus 2 NOT DETECTED NOT DETECTED Final   Parainfluenza Virus 3 NOT DETECTED NOT DETECTED Final   Parainfluenza Virus 4 NOT DETECTED NOT DETECTED Final   Respiratory Syncytial Virus NOT DETECTED NOT DETECTED Final   Bordetella pertussis NOT DETECTED NOT DETECTED Final   Bordetella Parapertussis NOT DETECTED NOT DETECTED Final   Chlamydophila pneumoniae NOT DETECTED NOT DETECTED Final   Mycoplasma pneumoniae NOT DETECTED NOT DETECTED Final    Comment: Performed at [facility], [address], [place], [place] [address]  MRSA Next Gen by PCR, Nasal     Status: None   Collection Time: 08/03/23  1:59 PM   Specimen: Nasal Mucosa; Nasal Swab  Result Value Ref Range Status   MRSA by PCR Next Gen NOT DETECTED NOT DETECTED Final    Comment: (NOTE) The GeneXpert MRSA Assay (FDA approved for NASAL specimens only), is one component of a comprehensive MRSA colonization surveillance program. It is not intended to diagnose MRSA infection nor to guide or monitor treatment for MRSA  infections. Test performance is not FDA approved in patients less than 64 years old. Performed at [facility], [address] [place], [place] [address]       Radiology Studies last 3 days: [facility] Chest [facility] 1 View Result Date: 08/03/2023 CLINICAL DATA:  10031 Cough 10031 EXAM: PORTABLE CHEST - 1 VIEW COMPARISON:  July 31, 2023 FINDINGS: Dense airspace consolidation filling the right mid and lower lung zones. No large pleural effusion or pneumothorax. No cardiomegaly. Aortic atherosclerosis. No acute fracture or destructive lesions. Multilevel thoracic osteophytosis. IMPRESSION: Dense airspace consolidation filling the right mid and lower lung zones, consistent with worsening bronchopneumonia. A 2 view chest radiograph in 6-12 weeks is recommended to document resolution once treatment is complete. These results will be called to the ordering clinician or representative by the Radiologist Assistant and communication documented in the PACS or [employer]. Electronically Signed   By: Rogelia Myers M.D.   On: 08/03/2023 10:36   DG Chest Port 1 View Result Date: 07/31/2023 CLINICAL DATA:  Fever. EXAM: PORTABLE CHEST 1 VIEW COMPARISON:  08/22/2011. FINDINGS: The heart size and mediastinal contours are within normal limits. No pneumothorax or pleural effusion. Stable medial right basilar density likely represents pericardial fat and is unchanged since 2013. Lungs are otherwise clear. Aorta is calcified. The visualized skeletal structures are unremarkable. IMPRESSION: No active disease. Electronically Signed   By: Fonda Field M.D.   On:  07/31/2023 20:05        Laneta Blunt, DO [facility] 08/04/2023, 2:47 PM    Dictation software may have been used to generate the above note. Typos may occur and escape review in typed/dictated notes. Please contact Dr Blunt directly for clarity if needed.  Staff may message me via secure chat in Epic  but this may not receive  an immediate response,  please page me for urgent matters!  If 7PM-7AM, please contact night coverage [email or web address]

## 2023-08-04 NOTE — Plan of Care (Signed)
  Problem: Respiratory: Goal: Ability to maintain adequate ventilation will improve Outcome: Progressing   Problem: Education: Goal: Knowledge of General Education information will improve Description: Including pain rating scale, medication(s)/side effects and non-pharmacologic comfort measures Outcome: Progressing   Problem: Activity: Goal: Risk for activity intolerance will decrease Outcome: Progressing   Problem: Nutrition: Goal: Adequate nutrition will be maintained Outcome: Progressing   Problem: Coping: Goal: Level of anxiety will decrease Outcome: Progressing   Problem: Safety: Goal: Ability to remain free from injury will improve Outcome: Progressing   Problem: Skin Integrity: Goal: Risk for impaired skin integrity will decrease Outcome: Progressing   Problem: Activity: Goal: Ability to tolerate increased activity will improve Outcome: Progressing   Problem: Activity: Goal: Ability to tolerate increased activity will improve Outcome: Progressing   Problem: Respiratory: Goal: Ability to maintain adequate ventilation will improve Outcome: Progressing Goal: Ability to maintain a clear airway will improve Outcome: Progressing

## 2023-08-04 NOTE — Care Management Important Message (Signed)
 Important Message  Patient Details  Name: Areyana Leoni MRN: [id number] Date of Birth: [birth year]   Important Message Given:  Yes - Medicare IM     Nubia Ziesmer W, CMA 08/04/2023, 12:19 PM

## 2023-08-04 NOTE — TOC Progression Note (Signed)
 Transition of Care [facility]) - Progression Note    Patient Details  Name: Lauren [name] MRN: [id number] Date of Birth: 02-03-1946  Transition of Care [facility]) CM/SW Contact  Quintella Suzen Jansky, RN Phone Number: 08/04/2023, 3:13 PM  Clinical Narrative:     Continues on IV ABX. Still no discharge needs identified  by [facility]       Expected Discharge Plan and Services                                               Social Determinants of Health (SDOH) Interventions SDOH Screenings   Food Insecurity: No Food Insecurity (08/01/2023)  Housing: Low Risk  (08/01/2023)  Transportation Needs: No Transportation Needs (08/01/2023)  Utilities: Not At Risk (08/01/2023)  Alcohol Screen: Low Risk  (02/17/2022)  Depression (PHQ2-9): Low Risk  (07/24/2023)  Financial Resource Strain: Low Risk  (07/24/2023)  Physical Activity: Inactive (07/24/2023)  Social Connections: Moderately Integrated (08/01/2023)  Stress: No Stress Concern Present (07/24/2023)  Tobacco Use: Low Risk  (07/31/2023)  Health Literacy: Adequate Health Literacy (03/15/2023)    Readmission Risk Interventions     No data to display

## 2023-08-05 ENCOUNTER — Inpatient Hospital Stay

## 2023-08-05 DIAGNOSIS — L089 Local infection of the skin and subcutaneous tissue, unspecified: Secondary | ICD-10-CM | POA: Diagnosis not present

## 2023-08-05 DIAGNOSIS — S81802A Unspecified open wound, left lower leg, initial encounter: Secondary | ICD-10-CM | POA: Diagnosis not present

## 2023-08-05 LAB — CULTURE, BLOOD (ROUTINE X 2)
Culture: NO GROWTH
Culture: NO GROWTH

## 2023-08-05 LAB — BASIC METABOLIC PANEL WITH GFR
Anion gap: 8 (ref 5–15)
BUN: 11 mg/dL (ref 8–23)
CO2: 26 mmol/L (ref 22–32)
Calcium: 8.3 mg/dL — ABNORMAL LOW (ref 8.9–10.3)
Chloride: 105 mmol/L (ref 98–111)
Creatinine, Ser: 0.51 mg/dL (ref 0.44–1.00)
GFR, Estimated: >60 mL/min (ref >60)
Glucose, Bld: 90 mg/dL (ref 70–99)
Potassium: 3.8 mmol/L (ref 3.5–5.1)
Sodium: 139 mmol/L (ref 135–145)

## 2023-08-05 MED ORDER — AZITHROMYCIN 500 MG PO TABS
500.0000 mg | ORAL_TABLET | Freq: Every day | ORAL | Status: DC
  Administered 2023-08-05 – 2023-08-06 (×2): 500 mg via ORAL
  Filled 2023-08-05 (×2): qty 1

## 2023-08-05 NOTE — Plan of Care (Signed)
  Problem: Respiratory: Goal: Ability to maintain adequate ventilation will improve Outcome: Progressing   Problem: Education: Goal: Knowledge of General Education information will improve Description: Including pain rating scale, medication(s)/side effects and non-pharmacologic comfort measures Outcome: Progressing   Problem: Activity: Goal: Risk for activity intolerance will decrease Outcome: Progressing   Problem: Nutrition: Goal: Adequate nutrition will be maintained Outcome: Progressing   Problem: Coping: Goal: Level of anxiety will decrease Outcome: Progressing   Problem: Elimination: Goal: Will not experience complications related to bowel motility Outcome: Progressing Goal: Will not experience complications related to urinary retention Outcome: Progressing   Problem: Pain Managment: Goal: General experience of comfort will improve and/or be controlled Outcome: Progressing   Problem: Safety: Goal: Ability to remain free from injury will improve Outcome: Progressing   Problem: Skin Integrity: Goal: Risk for impaired skin integrity will decrease Outcome: Progressing

## 2023-08-05 NOTE — Progress Notes (Signed)
 PROGRESS NOTE    Lauren [name]   FMW:[id number] DOB: 16-Sep-1945  DOA: 07/31/2023 Date of Service: 08/05/23 which is hospital day 4  PCP: Randeen Laine LABOR, MD    Hospital course / significant events:   HPI: Lauren [name] is a 78 y.o. female with medical history significant for Sjogren's on pilocarpine , OSA on CPAP, history of CVA, HTN, A-fib on Eliquis  (failed DCCV in 2022), with cellulitis secondary to infected wound bilateral lower legs d/t abrasions to anterior lower legs while climbing out of a pool.  Had 2 courses of outpatient antibiotics (Doxy, 6/13-6/20 then Bactrim , 6/20-6/23).  Following abx the right leg seem to be improving however she did have some draining from the wound on the left anterior leg and continues to have pain in that area.  On the day of arrival she developed a fever.   06/23: to ED in the evening. Admitted to hospitalist. Given Rocephin , Vanc.  06/24: continuing IV abx w/ Rocephin   06/25: stil lfebrile as of 02:30 and again 16:00, anticipate d/c once fever-free 24+ hours. Reports skin looks/feels improved  06/26: reports worsening cough, CXR (+)lobar pneumonia and resp viral (+)adenovirus, still intermittently febrile, escalating abx 06/27: continue IV abx, repeat CXR in AM 06/28: CXR not improvement but also not worse, pt feeling some better but still weak, not ambulating much      Consultants:  none  Procedures/Surgeries: none      ASSESSMENT & PLAN:  Lobar Pneumonia new dx 08/03/23  Concern for hospital acquired pneumonia - question if CXR on admission was in fact showing developing RLL infiltrate vs persistent pericardial benign finding as noted, either way definitely lobar pneumonia now  (+)Adenovirus  Not septic, no respiratory failure  No hx tobacco/secondhand smoke or occupational respiratory exposure, no known sick contacts, no COPD/asthma  Cefepime  for possible HCAP Duoneb Antitussive Incentive spirometry, flutter valve Legionella  testing pending  Sputum culture pending  Low threshold for CT imaging if not improving but pt has been stable/improving   Did not get blood cultures as not meeting sepsis criteria and already received IV abx, would obtain if persistent fever despite abx change  Bilateral LE cellulitis failed outpatient po antibiotics  Sepsis parameters resolved  Skin is improving  Rocephin  switched to Cefepime  as above   Chronic atrial fibrillation with RVR  Heart rate in the 120s on presentation, likely driven by infection.  Rate controlled with resuming home lopressor  25 mg BID cont home lopressor  cont Eliquis    Hypokalemia Replace as needed Monitor BMP  History of CVA (cerebrovascular accident) No acute issues suspected   Obstructive sleep apnea Continue CPAP nightly / when asleep    Sjogren's syndrome  No acute issues   Class 2 obesity based on BMI: Body mass index is 35.36 kg/m.SABRA Significantly low or high BMI is associated with higher medical risk.  Underweight - under 18  overweight - 25 to 29 obese - 30 or more Class 1 obesity: BMI of 30.0 to 34 Class 2 obesity: BMI of 35.0 to 39 Class 3 obesity: BMI of 40.0 to 49 Super Morbid Obesity: BMI 50-59 Super-super Morbid Obesity: BMI 60+ Healthy nutrition and physical activity advised as adjunct to other disease management and risk reduction treatments    DVT prophylaxis: eliquis  IV fluids: no continuous IV fluids  Nutrition: regular Central lines / other devices: none  Code Status: DNR ACP documentation reviewed:  none on file in [facility]  TOC needs: none Medical barriers to dispo: IV abx, anticipate dc on  po abx tomorrow if feeling improved and ambulating well             Subjective / Brief ROS:  Patient reports still significant fatigue today No fever/chills Denies CP/SOB but has;t been ambulating  Pain controlled.  Denies new weakness.  Tolerating diet.    Family Communication: none at this time       Objective Findings:  Vitals:   08/04/23 1933 08/04/23 2107 08/05/23 0326 08/05/23 0813  BP: 91/63 104/62 113/67 115/66  Pulse: 91 85 84 94  Resp: 17  20 16   Temp: 98.9 F (37.2 C)  98.2 F (36.8 C) 98.1 F (36.7 C)  TempSrc: Oral     SpO2: 99%  97% 97%  Weight:      Height:        Intake/Output Summary (Last 24 hours) at 08/05/2023 1152 Last data filed at 08/04/2023 2100 Gross per 24 hour  Intake 690 ml  Output --  Net 690 ml   Filed Weights   08/01/23 0200  Weight: 105.5 kg    Examination:  Physical Exam Constitutional:      General: She is not in acute distress.  Cardiovascular:     Rate and Rhythm: Normal rate. Rhythm irregular.  Pulmonary:     Effort: Pulmonary effort is normal. No respiratory distress.     Breath sounds: No rhonchi (R lower lung fields sounds som eimproved compared to yesterday).   Skin:    General: Skin is warm and dry.     Comments: Scabbed-over longitudinal abrasions on anterior lower extremities, minimal erythema/warmth (Pt reports improvement in redness and pain)    Neurological:     General: No focal deficit present.     Mental Status: She is alert and oriented to person, place, and time.   Psychiatric:        Mood and Affect: Mood normal.        Behavior: Behavior normal.          Scheduled Medications:   apixaban   5 mg Oral BID   azithromycin   500 mg Oral Daily   DULoxetine   30 mg Oral Daily   metoprolol  tartrate  25 mg Oral BID    Continuous Infusions:  ceFEPime  (MAXIPIME ) IV 2 g (08/05/23 0505)    PRN Medications:  acetaminophen  **OR** acetaminophen , guaiFENesin -dextromethorphan , HYDROcodone -acetaminophen , ipratropium-albuterol , prochlorperazine , promethazine   Antimicrobials from admission:  Anti-infectives (From admission, onward)    Start     Dose/Rate Route Frequency Ordered Stop   08/05/23 1000  azithromycin  (ZITHROMAX ) tablet 500 mg        500 mg Oral Daily 08/05/23 0845 08/08/23 0959    08/03/23 1800  ceFEPIme  (MAXIPIME ) 2 g in sodium chloride  0.9 % 100 mL IVPB        2 g 200 mL/hr over 30 Minutes Intravenous Every 8 hours 08/03/23 1621     08/03/23 1715  ceFEPIme  (MAXIPIME ) 1 g in sodium chloride  0.9 % 100 mL IVPB  Status:  Discontinued        1 g 200 mL/hr over 30 Minutes Intravenous Every 12 hours 08/03/23 1618 08/03/23 1620   08/03/23 1230  azithromycin  (ZITHROMAX ) 500 mg in sodium chloride  0.9 % 250 mL IVPB  Status:  Discontinued        500 mg 250 mL/hr over 60 Minutes Intravenous Every 24 hours 08/03/23 1127 08/05/23 0845   08/01/23 1800  vancomycin  (VANCOREADY) IVPB 1500 mg/300 mL  Status:  Discontinued        1,500 mg  150 mL/hr over 120 Minutes Intravenous Every 24 hours 07/31/23 2155 08/01/23 1032   08/01/23 0600  cefTRIAXone  (ROCEPHIN ) 2 g in sodium chloride  0.9 % 100 mL IVPB  Status:  Discontinued        2 g 200 mL/hr over 30 Minutes Intravenous Every 24 hours 07/31/23 2140 08/03/23 1618   07/31/23 2000  vancomycin  (VANCOREADY) IVPB 2000 mg/400 mL        2,000 mg 200 mL/hr over 120 Minutes Intravenous  Once 07/31/23 1934 07/31/23 2220   07/31/23 1945  ceFEPIme  (MAXIPIME ) 2 g in sodium chloride  0.9 % 100 mL IVPB        2 g 200 mL/hr over 30 Minutes Intravenous  Once 07/31/23 1934 07/31/23 2052           Data Reviewed:  I have personally reviewed the following...  CBC: Recent Labs  Lab 07/31/23 1936 08/01/23 0413 08/03/23 1146  WBC 10.0 7.9 6.7  NEUTROABS 7.8*  --   --   HGB 13.8 12.9 12.2  HCT 40.7 41.1 36.6  MCV 84.1 88.8 84.9  PLT 206 180 158   Basic Metabolic Panel: Recent Labs  Lab 07/31/23 1936 08/01/23 0413 08/03/23 1146 08/05/23 0431  NA 133* 138 135 139  K 3.7 3.6 3.0* 3.8  CL 99 108 100 105  CO2 20* 21* 23 26  GLUCOSE 132* 107* 113* 90  BUN 14 13 9 11   CREATININE 0.61 0.52 0.49 0.51  CALCIUM  8.5* 8.3* 8.1* 8.3*   GFR: Estimated Creatinine Clearance: 73.7 mL/min (by C-G formula based on SCr of 0.51 mg/dL). Liver  Function Tests: Recent Labs  Lab 07/31/23 1936  AST 17  ALT 11  ALKPHOS 75  BILITOT 0.8  PROT 7.4  ALBUMIN 3.6   No results for input(s): LIPASE, AMYLASE in the last 168 hours. No results for input(s): AMMONIA in the last 168 hours. Coagulation Profile: Recent Labs  Lab 07/31/23 1936  INR 1.2   Cardiac Enzymes: No results for input(s): CKTOTAL, CKMB, CKMBINDEX, TROPONINI in the last 168 hours. BNP (last 3 results) No results for input(s): PROBNP in the last 8760 hours. HbA1C: No results for input(s): HGBA1C in the last 72 hours. CBG: No results for input(s): GLUCAP in the last 168 hours. Lipid Profile: No results for input(s): CHOL, HDL, LDLCALC, TRIG, CHOLHDL, LDLDIRECT in the last 72 hours. Thyroid  Function Tests: No results for input(s): TSH, T4TOTAL, FREET4, T3FREE, THYROIDAB in the last 72 hours. Anemia Panel: No results for input(s): VITAMINB12, FOLATE, FERRITIN, TIBC, IRON, RETICCTPCT in the last 72 hours. Most Recent Urinalysis On File:     Component Value Date/Time   COLORURINE YELLOW (A) 07/31/2023 2011   APPEARANCEUR CLEAR (A) 07/31/2023 2011   LABSPEC 1.025 07/31/2023 2011   PHURINE 5.0 07/31/2023 2011   GLUCOSEU NEGATIVE 07/31/2023 2011   HGBUR NEGATIVE 07/31/2023 2011   BILIRUBINUR NEGATIVE 07/31/2023 2011   BILIRUBINUR negative 09/11/2019 1324   KETONESUR 5 (A) 07/31/2023 2011   PROTEINUR NEGATIVE 07/31/2023 2011   UROBILINOGEN 0.2 09/11/2019 1324   NITRITE NEGATIVE 07/31/2023 2011   LEUKOCYTESUR NEGATIVE 07/31/2023 2011   Sepsis Labs: @LABRCNTIP (procalcitonin:4,lacticidven:4) Microbiology: Recent Results (from the past 240 hours)  Blood Culture (routine x 2)     Status: None   Collection Time: 07/31/23  7:36 PM   Specimen: BLOOD  Result Value Ref Range Status   Specimen Description BLOOD BLOOD RIGHT ARM  Final   Special Requests   Final    BOTTLES DRAWN AEROBIC AND ANAEROBIC  Blood Culture  results may not be optimal due to an inadequate volume of blood received in culture bottles   Culture   Final    NO GROWTH 5 DAYS Performed at [facility], [address] [place], [place] [address]    Report Status 08/05/2023 FINAL  Final  Blood Culture (routine x 2)     Status: None   Collection Time: 07/31/23  7:36 PM   Specimen: BLOOD  Result Value Ref Range Status   Specimen Description BLOOD BLOOD RIGHT ARM  Final   Special Requests   Final    BOTTLES DRAWN AEROBIC AND ANAEROBIC Blood Culture results may not be optimal due to an inadequate volume of blood received in culture bottles   Culture   Final    NO GROWTH 5 DAYS Performed at [facility], [address] [place], [place] [address]    Report Status 08/05/2023 FINAL  Final  Resp panel by RT-PCR (RSV, Flu A&B, Covid) Anterior Nasal Swab     Status: None   Collection Time: 08/03/23 10:28 AM   Specimen: Anterior Nasal Swab  Result Value Ref Range Status   SARS Coronavirus 2 by RT PCR NEGATIVE NEGATIVE Final    Comment: (NOTE) SARS-CoV-2 target nucleic acids are NOT DETECTED.  The SARS-CoV-2 RNA is generally detectable in upper respiratory specimens during the acute phase of infection. The lowest concentration of SARS-CoV-2 viral copies this assay can detect is 138 copies/mL. A negative result does not preclude SARS-Cov-2 infection and should not be used as the sole basis for treatment or other patient management decisions. A negative result may occur with  improper specimen collection/handling, submission of specimen other than nasopharyngeal swab, presence of viral mutation(s) within the areas targeted by this assay, and inadequate number of viral copies(<138 copies/mL). A negative result must be combined with clinical observations, patient history, and epidemiological information. The expected result is Negative.  Fact Sheet for Patients:  BloggerCourse.com  Fact Sheet  for Healthcare Providers:  SeriousBroker.it  This test is no t yet approved or cleared by the [place]  FDA and  has been authorized for detection and/or diagnosis of SARS-CoV-2 by FDA under an Emergency Use Authorization (EUA). This EUA will remain  in effect (meaning this test can be used) for the duration of the COVID-19 declaration under Section 564(b)(1) of the Act, 21 U.S.C.section 360bbb-3(b)(1), unless the authorization is terminated  or revoked sooner.       Influenza A by PCR NEGATIVE NEGATIVE Final   Influenza B by PCR NEGATIVE NEGATIVE Final    Comment: (NOTE) The Xpert Xpress SARS-CoV-2/FLU/RSV plus assay is intended as an aid in the diagnosis of influenza from Nasopharyngeal swab specimens and should not be used as a sole basis for treatment. Nasal washings and aspirates are unacceptable for Xpert Xpress SARS-CoV-2/FLU/RSV testing.  Fact Sheet for Patients: BloggerCourse.com  Fact Sheet for Healthcare Providers: SeriousBroker.it  This test is not yet approved or cleared by the [place]  FDA and has been authorized for detection and/or diagnosis of SARS-CoV-2 by FDA under an Emergency Use Authorization (EUA). This EUA will remain in effect (meaning this test can be used) for the duration of the COVID-19 declaration under Section 564(b)(1) of the Act, 21 U.S.C. section 360bbb-3(b)(1), unless the authorization is terminated or revoked.     Resp Syncytial Virus by PCR NEGATIVE NEGATIVE Final    Comment: (NOTE) Fact Sheet for Patients: BloggerCourse.com  Fact Sheet for Healthcare Providers: SeriousBroker.it  This test is  not yet approved or cleared by the [place]  FDA and has been authorized for detection and/or diagnosis of SARS-CoV-2 by FDA under an Emergency Use Authorization (EUA). This EUA will remain in effect (meaning  this test can be used) for the duration of the COVID-19 declaration under Section 564(b)(1) of the Act, 21 U.S.C. section 360bbb-3(b)(1), unless the authorization is terminated or revoked.  Performed at [facility], [address] [place], [place] [address]   Respiratory (~20 pathogens) panel by PCR     Status: Abnormal   Collection Time: 08/03/23 10:28 AM   Specimen: Nasopharyngeal Swab; Respiratory  Result Value Ref Range Status   Adenovirus DETECTED (A) NOT DETECTED Final   Coronavirus 229E NOT DETECTED NOT DETECTED Final    Comment: (NOTE) The Coronavirus on the Respiratory Panel, DOES NOT test for the novel  Coronavirus (2019 nCoV)    Coronavirus HKU1 NOT DETECTED NOT DETECTED Final   Coronavirus NL63 NOT DETECTED NOT DETECTED Final   Coronavirus OC43 NOT DETECTED NOT DETECTED Final   Metapneumovirus NOT DETECTED NOT DETECTED Final   Rhinovirus / Enterovirus NOT DETECTED NOT DETECTED Final   Influenza A NOT DETECTED NOT DETECTED Final   Influenza B NOT DETECTED NOT DETECTED Final   Parainfluenza Virus 1 NOT DETECTED NOT DETECTED Final   Parainfluenza Virus 2 NOT DETECTED NOT DETECTED Final   Parainfluenza Virus 3 NOT DETECTED NOT DETECTED Final   Parainfluenza Virus 4 NOT DETECTED NOT DETECTED Final   Respiratory Syncytial Virus NOT DETECTED NOT DETECTED Final   Bordetella pertussis NOT DETECTED NOT DETECTED Final   Bordetella Parapertussis NOT DETECTED NOT DETECTED Final   Chlamydophila pneumoniae NOT DETECTED NOT DETECTED Final   Mycoplasma pneumoniae NOT DETECTED NOT DETECTED Final    Comment: Performed at [facility], [address] [place], [place] [address]  MRSA Next Gen by PCR, Nasal     Status: None   Collection Time: 08/03/23  1:59 PM   Specimen: Nasal Mucosa; Nasal Swab  Result Value Ref Range Status   MRSA by PCR Next Gen NOT DETECTED NOT DETECTED Final    Comment: (NOTE) The GeneXpert MRSA Assay (FDA approved for NASAL specimens  only), is one component of a comprehensive MRSA colonization surveillance program. It is not intended to diagnose MRSA infection nor to guide or monitor treatment for MRSA infections. Test performance is not FDA approved in patients less than 8 years old. Performed at [facility], [address], [place], [place] [address]       Radiology Studies last 3 days: [facility] Chest [place] 1 View Result Date: 08/05/2023 CLINICAL DATA:  78 year old female with right mid and lower lung airspace opacity this month, progressive on portable chest 2 days ago. EXAM: PORTABLE CHEST 1 VIEW COMPARISON:  Portable chest 08/03/2023 and earlier. FINDINGS: Portable AP upright view at 0606 hours. Similar confluent right lung base opacity. A portion of the right hemidiaphragm remains visible. Stable lung volumes. Stable cardiac size and mediastinal contours. No pneumothorax or pulmonary edema. No confluent left lung opacity. No acute osseous abnormality identified. Paucity of bowel gas. IMPRESSION: Ongoing right mid and lower lung confluent opacity. No significant change. No new cardiopulmonary abnormality. Electronically Signed   By: VEAR Hurst M.D.   On: 08/05/2023 08:10   DG Chest Port 1 View Result Date: 08/03/2023 CLINICAL DATA:  10031 Cough 10031 EXAM: PORTABLE CHEST - 1 VIEW COMPARISON:  July 31, 2023 FINDINGS: Dense airspace consolidation filling the right mid and lower  lung zones. No large pleural effusion or pneumothorax. No cardiomegaly. Aortic atherosclerosis. No acute fracture or destructive lesions. Multilevel thoracic osteophytosis. IMPRESSION: Dense airspace consolidation filling the right mid and lower lung zones, consistent with worsening bronchopneumonia. A 2 view chest radiograph in 6-12 weeks is recommended to document resolution once treatment is complete. These results will be called to the ordering clinician or representative by the Radiologist Assistant and communication documented in the PACS or  [employer]. Electronically Signed   By: Rogelia Myers M.D.   On: 08/03/2023 10:36        Laneta Blunt, DO [facility] 08/05/2023, 11:52 AM    Dictation software may have been used to generate the above note. Typos may occur and escape review in typed/dictated notes. Please contact Dr Blunt directly for clarity if needed.  Staff may message me via secure chat in Epic  but this may not receive an immediate response,  please page me for urgent matters!  If 7PM-7AM, please contact night coverage [email or web address]

## 2023-08-06 DIAGNOSIS — S81802A Unspecified open wound, left lower leg, initial encounter: Secondary | ICD-10-CM | POA: Diagnosis not present

## 2023-08-06 DIAGNOSIS — L089 Local infection of the skin and subcutaneous tissue, unspecified: Secondary | ICD-10-CM | POA: Diagnosis not present

## 2023-08-06 MED ORDER — GUAIFENESIN-DM 100-10 MG/5ML PO SYRP: 10.0000 mL | ORAL_SOLUTION | ORAL | 0 refills | Status: DC | PRN

## 2023-08-06 MED ORDER — AZITHROMYCIN 250 MG PO TABS: 250.0000 mg | ORAL_TABLET | Freq: Every day | ORAL | 0 refills | Status: AC

## 2023-08-06 MED ORDER — FLUCONAZOLE IN SODIUM CHLORIDE 200-0.9 MG/100ML-% IV SOLN
200.0000 mg | Freq: Once | INTRAVENOUS | Status: AC
  Administered 2023-08-06: 200 mg via INTRAVENOUS
  Filled 2023-08-06 (×2): qty 100

## 2023-08-06 MED ORDER — FLUCONAZOLE 150 MG PO TABS: 150.0000 mg | ORAL_TABLET | Freq: Once | ORAL | 0 refills | Status: AC

## 2023-08-06 MED ORDER — AMOXICILLIN-POT CLAVULANATE 875-125 MG PO TABS: 1.0000 | ORAL_TABLET | Freq: Two times a day (BID) | ORAL | 0 refills | Status: AC

## 2023-08-06 MED ORDER — PROMETHAZINE HCL 25 MG PO TABS: 25.0000 mg | ORAL_TABLET | Freq: Four times a day (QID) | ORAL | 0 refills | Status: DC | PRN

## 2023-08-06 NOTE — Progress Notes (Signed)
 MD order received in [facility] to discharge pt home today; verbally reviewed AVS with pt, Rxs escribed to the [facility] on [address] in [place], [place], pt to call on Monday, 08/07/23 PCP to schedule follow-up appt; no questions voiced at this time; pt's discharge pending completion of IV Cefepime , removing existing IV site and arrival of her ride home

## 2023-08-06 NOTE — Discharge Summary (Signed)
 Physician Discharge Summary   Patient: Lauren [name] MRN: [id number]  DOB: [birth year]   Admit:     Date of Admission: 07/31/2023 Admitted from: home   Discharge: Date of discharge: 08/06/23 Disposition: Home Condition at discharge: good  CODE STATUS: FULL CODE     Discharge Physician: Laneta Blunt, DO [facility]     PCP: Randeen Laine LABOR, MD  Recommendations for Outpatient Follow-up:  Follow up with PCP [place], Laine LABOR, MD in 1-2 weeks Please obtain labs/tests: CBC, BMP, CXR in 1-2 weeks    Discharge Instructions     Amb referral to [facility]   Complete by: As directed    Discharge instructions   Complete by: As directed    Please call PCP to arrange hospital follow up - recommend repeat Xray in another 1-2 weeks to ensure resolution. Please seek emergency medical care if weakness, fever, trouble breathing. Cough is expected, treat as needed. Finish all antibiotics   Increase activity slowly   Complete by: As directed          Discharge Diagnoses: Principal Problem:   Traumatic wound of lower leg with infection, left, initial encounter Active Problems:   Chronic atrial fibrillation with RVR (HCC)   SIRS (systemic inflammatory response syndrome) (HCC)   Sjogren's syndrome (HCC)   Obstructive sleep apnea   History of CVA (cerebrovascular accident)   Cellulitis       Hospital Course: Hospital course / significant events:   HPI: Lauren [name] is a 78 y.o. female with medical history significant for Sjogren's on pilocarpine , OSA on CPAP, history of CVA, HTN, A-fib on Eliquis  (failed DCCV in 2022), with cellulitis secondary to infected wound bilateral lower legs d/t abrasions to anterior lower legs while climbing out of a pool.  Had 2 courses of outpatient antibiotics (Doxy, 6/13-6/20 then Bactrim , 6/20-6/23).  Following abx the right leg seem to be improving however she did have some draining from the wound on the left anterior leg and  continues to have pain in that area.  On the day of arrival she developed a fever.   06/23: to ED in the evening. Admitted to hospitalist. Given Rocephin , Vanc.  06/24: continuing IV abx w/ Rocephin   06/25: stil lfebrile as of 02:30 and again 16:00, anticipate d/c once fever-free 24+ hours. Reports skin looks/feels improved  06/26: reports worsening cough, CXR (+)lobar pneumonia and resp viral (+)adenovirus, still intermittently febrile, escalating abx 06/27: continue IV abx, repeat CXR in AM 06/28: CXR not improvement but also not worse, pt feeling some better but still weak, not ambulating much  06/29: feeling better today, ambulating independently, more energy, still some cough but no SOB. Stable for dc home      Consultants:  none  Procedures/Surgeries: none      ASSESSMENT & PLAN:  Lobar Pneumonia new dx 08/03/23  Concern for hospital acquired pneumonia - question if CXR on admission was in fact showing developing RLL infiltrate vs persistent pericardial benign finding as noted, either way definitely lobar pneumonia now  (+)Adenovirus  Not septic, no respiratory failure  No hx tobacco/secondhand smoke or occupational respiratory exposure, no known sick contacts, no COPD/asthma  Augmentin  + azithromycin  on discharge  Antitussive Incentive spirometry, flutter valve Repeat CXR outpatient per PCP  Bilateral LE cellulitis failed outpatient po antibiotics  Sepsis parameters resolved  Skin is improving  Abx as above but cellulitis at this point is improved/resolved    Chronic atrial fibrillation with RVR  Heart rate in  the 120s on presentation, likely driven by infection.  Rate controlled with resuming home lopressor  25 mg BID cont home lopressor  cont Eliquis    Hypokalemia Monitor BMP outpatient   History of CVA (cerebrovascular accident) No acute issues suspected   Obstructive sleep apnea Continue CPAP nightly / when asleep    Sjogren's syndrome  No acute  issues   Class 2 obesity based on BMI: Body mass index is 35.36 kg/m.SABRA Significantly low or high BMI is associated with higher medical risk.  Underweight - under 18  overweight - 25 to 29 obese - 30 or more Class 1 obesity: BMI of 30.0 to 34 Class 2 obesity: BMI of 35.0 to 39 Class 3 obesity: BMI of 40.0 to 49 Super Morbid Obesity: BMI 50-59 Super-super Morbid Obesity: BMI 60+ Healthy nutrition and physical activity advised as adjunct to other disease management and risk reduction treatments          Discharge Instructions  Allergies as of 08/06/2023       Reactions   Crestor  [rosuvastatin ]    myalgias   Lipitor [atorvastatin]    myalgias   Statins    joint pain   Tramadol     Night mares , does not help pain    Wasp Venom Swelling        Medication List     STOP taking these medications    sulfamethoxazole -trimethoprim  800-160 MG tablet Commonly known as: BACTRIM  DS       TAKE these medications    acetaminophen  500 MG tablet Commonly known as: TYLENOL  Take 1,000 mg by mouth every 8 (eight) hours as needed for moderate pain (pain score 4-6).   amoxicillin -clavulanate 875-125 MG tablet Commonly known as: AUGMENTIN  Take 1 tablet by mouth 2 (two) times daily for 7 days.   apixaban  5 MG Tabs tablet Commonly known as: Eliquis  Take 1 tablet (5 mg total) by mouth 2 (two) times daily.   azithromycin  250 MG tablet Commonly known as: Zithromax  Z-Pak Take 1 tablet (250 mg total) by mouth daily for 3 days.   DULoxetine  30 MG capsule Commonly known as: CYMBALTA  Take 1 capsule (30 mg total) by mouth daily.   fluconazole  150 MG tablet Commonly known as: DIFLUCAN  Take 1 tablet (150 mg total) by mouth once for 1 dose. As needed for yeast infection. Repeat dose in 72 hours if still having yeast symptoms   gabapentin  300 MG capsule Commonly known as: NEURONTIN  Take 2 by mouth in am and 3 in pm   guaiFENesin -dextromethorphan  100-10 MG/5ML syrup Commonly  known as: ROBITUSSIN DM Take 10 mLs by mouth every 4 (four) hours as needed for cough.   methocarbamol  500 MG tablet Commonly known as: ROBAXIN  Take 1 tablet (500 mg total) by mouth 2 (two) times daily as needed for muscle spasms. Cauttion of sedation   metoprolol  tartrate 25 MG tablet Commonly known as: LOPRESSOR  Take 1 tablet (25 mg total) by mouth 2 (two) times daily.   prednisoLONE acetate 1 % ophthalmic suspension Commonly known as: PRED FORTE Place 1 drop into the left eye 4 (four) times daily.   promethazine  25 MG tablet Commonly known as: PHENERGAN  Take 1 tablet (25 mg total) by mouth every 6 (six) hours as needed for nausea or vomiting.   Repatha  SureClick 140 MG/ML Soaj Generic drug: Evolocumab  INJECT ONE DOSE SUBCUTANEOUSLY EVERY 14 DAYS          Allergies  Allergen Reactions   Crestor  [Rosuvastatin ]     myalgias   Lipitor [  Atorvastatin]     myalgias   Statins     joint pain   Tramadol      Night mares , does not help pain    Wasp Venom Swelling     Subjective: feeling better today, ambulating independently, more energy, still some cough but no SOB   Discharge Exam: BP 116/72 (BP Location: Right Arm)   Pulse 84   Temp 97.8 F (36.6 C)   Resp 18   Ht 5' 8 (1.727 m)   Wt 105.5 kg   SpO2 100%   BMI 35.36 kg/m  General: Pt is alert, awake, not in acute distress Cardiovascular: RRR, S1/S2 +, no rubs, no gallops Respiratory: (+)rhonchi on R lower lung fields is improved from previous exams, no wheezing Abdominal: Soft, NT, ND, bowel sounds + Extremities: no edema, no cyanosis. Skin anterior shins minimal erythema and healing abrasions      The results of significant diagnostics from this hospitalization (including imaging, microbiology, ancillary and laboratory) are listed below for reference.     Microbiology: Recent Results (from the past 240 hours)  Blood Culture (routine x 2)     Status: None   Collection Time: 07/31/23  7:36 PM    Specimen: BLOOD  Result Value Ref Range Status   Specimen Description BLOOD BLOOD RIGHT ARM  Final   Special Requests   Final    BOTTLES DRAWN AEROBIC AND ANAEROBIC Blood Culture results may not be optimal due to an inadequate volume of blood received in culture bottles   Culture   Final    NO GROWTH 5 DAYS Performed at [facility], [address] [place], [place] [address]    Report Status 08/05/2023 FINAL  Final  Blood Culture (routine x 2)     Status: None   Collection Time: 07/31/23  7:36 PM   Specimen: BLOOD  Result Value Ref Range Status   Specimen Description BLOOD BLOOD RIGHT ARM  Final   Special Requests   Final    BOTTLES DRAWN AEROBIC AND ANAEROBIC Blood Culture results may not be optimal due to an inadequate volume of blood received in culture bottles   Culture   Final    NO GROWTH 5 DAYS Performed at [facility], [address] [place], [place] [address]    Report Status 08/05/2023 FINAL  Final  Resp panel by RT-PCR (RSV, Flu A&B, Covid) Anterior Nasal Swab     Status: None   Collection Time: 08/03/23 10:28 AM   Specimen: Anterior Nasal Swab  Result Value Ref Range Status   SARS Coronavirus 2 by RT PCR NEGATIVE NEGATIVE Final    Comment: (NOTE) SARS-CoV-2 target nucleic acids are NOT DETECTED.  The SARS-CoV-2 RNA is generally detectable in upper respiratory specimens during the acute phase of infection. The lowest concentration of SARS-CoV-2 viral copies this assay can detect is 138 copies/mL. A negative result does not preclude SARS-Cov-2 infection and should not be used as the sole basis for treatment or other patient management decisions. A negative result may occur with  improper specimen collection/handling, submission of specimen other than nasopharyngeal swab, presence of viral mutation(s) within the areas targeted by this assay, and inadequate number of viral copies(<138 copies/mL). A negative result must be combined with clinical  observations, patient history, and epidemiological information. The expected result is Negative.  Fact Sheet for Patients:  BloggerCourse.com  Fact Sheet for Healthcare Providers:  SeriousBroker.it  This test is no t yet approved or cleared by the [place]  [place] FDA and  has been authorized for detection and/or diagnosis of SARS-CoV-2 by FDA under an Emergency Use Authorization (EUA). This EUA will remain  in effect (meaning this test can be used) for the duration of the COVID-19 declaration under Section 564(b)(1) of the Act, 21 U.S.C.section 360bbb-3(b)(1), unless the authorization is terminated  or revoked sooner.       Influenza A by PCR NEGATIVE NEGATIVE Final   Influenza B by PCR NEGATIVE NEGATIVE Final    Comment: (NOTE) The Xpert Xpress SARS-CoV-2/FLU/RSV plus assay is intended as an aid in the diagnosis of influenza from Nasopharyngeal swab specimens and should not be used as a sole basis for treatment. Nasal washings and aspirates are unacceptable for Xpert Xpress SARS-CoV-2/FLU/RSV testing.  Fact Sheet for Patients: BloggerCourse.com  Fact Sheet for Healthcare Providers: SeriousBroker.it  This test is not yet approved or cleared by the [place]  FDA and has been authorized for detection and/or diagnosis of SARS-CoV-2 by FDA under an Emergency Use Authorization (EUA). This EUA will remain in effect (meaning this test can be used) for the duration of the COVID-19 declaration under Section 564(b)(1) of the Act, 21 U.S.C. section 360bbb-3(b)(1), unless the authorization is terminated or revoked.     Resp Syncytial Virus by PCR NEGATIVE NEGATIVE Final    Comment: (NOTE) Fact Sheet for Patients: BloggerCourse.com  Fact Sheet for Healthcare Providers: SeriousBroker.it  This test is not yet approved or cleared by  the [place]  FDA and has been authorized for detection and/or diagnosis of SARS-CoV-2 by FDA under an Emergency Use Authorization (EUA). This EUA will remain in effect (meaning this test can be used) for the duration of the COVID-19 declaration under Section 564(b)(1) of the Act, 21 U.S.C. section 360bbb-3(b)(1), unless the authorization is terminated or revoked.  Performed at [facility], [address] [place], [place] [address]   Respiratory (~20 pathogens) panel by PCR     Status: Abnormal   Collection Time: 08/03/23 10:28 AM   Specimen: Nasopharyngeal Swab; Respiratory  Result Value Ref Range Status   Adenovirus DETECTED (A) NOT DETECTED Final   Coronavirus 229E NOT DETECTED NOT DETECTED Final    Comment: (NOTE) The Coronavirus on the Respiratory Panel, DOES NOT test for the novel  Coronavirus (2019 nCoV)    Coronavirus HKU1 NOT DETECTED NOT DETECTED Final   Coronavirus NL63 NOT DETECTED NOT DETECTED Final   Coronavirus OC43 NOT DETECTED NOT DETECTED Final   Metapneumovirus NOT DETECTED NOT DETECTED Final   Rhinovirus / Enterovirus NOT DETECTED NOT DETECTED Final   Influenza A NOT DETECTED NOT DETECTED Final   Influenza B NOT DETECTED NOT DETECTED Final   Parainfluenza Virus 1 NOT DETECTED NOT DETECTED Final   Parainfluenza Virus 2 NOT DETECTED NOT DETECTED Final   Parainfluenza Virus 3 NOT DETECTED NOT DETECTED Final   Parainfluenza Virus 4 NOT DETECTED NOT DETECTED Final   Respiratory Syncytial Virus NOT DETECTED NOT DETECTED Final   Bordetella pertussis NOT DETECTED NOT DETECTED Final   Bordetella Parapertussis NOT DETECTED NOT DETECTED Final   Chlamydophila pneumoniae NOT DETECTED NOT DETECTED Final   Mycoplasma pneumoniae NOT DETECTED NOT DETECTED Final    Comment: Performed at [facility], [address] [place], [place] [address]  MRSA Next Gen by PCR, Nasal     Status: None   Collection Time: 08/03/23  1:59 PM   Specimen: Nasal Mucosa;  Nasal Swab  Result Value Ref Range Status   MRSA by PCR Next Gen  NOT DETECTED NOT DETECTED Final    Comment: (NOTE) The GeneXpert MRSA Assay (FDA approved for NASAL specimens only), is one component of a comprehensive MRSA colonization surveillance program. It is not intended to diagnose MRSA infection nor to guide or monitor treatment for MRSA infections. Test performance is not FDA approved in patients less than 73 years old. Performed at [facility], [address] [place], [place] [address]      Labs: BNP (last 3 results) No results for input(s): BNP in the last 8760 hours. Basic Metabolic Panel: Recent Labs  Lab 07/31/23 1936 08/01/23 0413 08/03/23 1146 08/05/23 0431  NA 133* 138 135 139  K 3.7 3.6 3.0* 3.8  CL 99 108 100 105  CO2 20* 21* 23 26  GLUCOSE 132* 107* 113* 90  BUN 14 13 9 11   CREATININE 0.61 0.52 0.49 0.51  CALCIUM  8.5* 8.3* 8.1* 8.3*   Liver Function Tests: Recent Labs  Lab 07/31/23 1936  AST 17  ALT 11  ALKPHOS 75  BILITOT 0.8  PROT 7.4  ALBUMIN 3.6   No results for input(s): LIPASE, AMYLASE in the last 168 hours. No results for input(s): AMMONIA in the last 168 hours. CBC: Recent Labs  Lab 07/31/23 1936 08/01/23 0413 08/03/23 1146  WBC 10.0 7.9 6.7  NEUTROABS 7.8*  --   --   HGB 13.8 12.9 12.2  HCT 40.7 41.1 36.6  MCV 84.1 88.8 84.9  PLT 206 180 158   Cardiac Enzymes: No results for input(s): CKTOTAL, CKMB, CKMBINDEX, TROPONINI in the last 168 hours. BNP: Invalid input(s): POCBNP CBG: No results for input(s): GLUCAP in the last 168 hours. D-Dimer No results for input(s): DDIMER in the last 72 hours. Hgb A1c No results for input(s): HGBA1C in the last 72 hours. Lipid Profile No results for input(s): CHOL, HDL, LDLCALC, TRIG, CHOLHDL, LDLDIRECT in the last 72 hours. Thyroid  function studies No results for input(s): TSH, T4TOTAL, T3FREE, THYROIDAB in the last 72  hours.  Invalid input(s): FREET3 Anemia work up No results for input(s): VITAMINB12, FOLATE, FERRITIN, TIBC, IRON, RETICCTPCT in the last 72 hours. Urinalysis    Component Value Date/Time   COLORURINE YELLOW (A) 07/31/2023 2011   APPEARANCEUR CLEAR (A) 07/31/2023 2011   LABSPEC 1.025 07/31/2023 2011   PHURINE 5.0 07/31/2023 2011   GLUCOSEU NEGATIVE 07/31/2023 2011   HGBUR NEGATIVE 07/31/2023 2011   BILIRUBINUR NEGATIVE 07/31/2023 2011   BILIRUBINUR negative 09/11/2019 1324   KETONESUR 5 (A) 07/31/2023 2011   PROTEINUR NEGATIVE 07/31/2023 2011   UROBILINOGEN 0.2 09/11/2019 1324   NITRITE NEGATIVE 07/31/2023 2011   LEUKOCYTESUR NEGATIVE 07/31/2023 2011   Sepsis Labs Recent Labs  Lab 07/31/23 1936 08/01/23 0413 08/03/23 1146  WBC 10.0 7.9 6.7   Microbiology Recent Results (from the past 240 hours)  Blood Culture (routine x 2)     Status: None   Collection Time: 07/31/23  7:36 PM   Specimen: BLOOD  Result Value Ref Range Status   Specimen Description BLOOD BLOOD RIGHT ARM  Final   Special Requests   Final    BOTTLES DRAWN AEROBIC AND ANAEROBIC Blood Culture results may not be optimal due to an inadequate volume of blood received in culture bottles   Culture   Final    NO GROWTH 5 DAYS Performed at [facility], [address], [place], [place] [address]    Report Status 08/05/2023 FINAL  Final  Blood Culture (routine x 2)     Status: None  Collection Time: 07/31/23  7:36 PM   Specimen: BLOOD  Result Value Ref Range Status   Specimen Description BLOOD BLOOD RIGHT ARM  Final   Special Requests   Final    BOTTLES DRAWN AEROBIC AND ANAEROBIC Blood Culture results may not be optimal due to an inadequate volume of blood received in culture bottles   Culture   Final    NO GROWTH 5 DAYS Performed at [facility], [address] [place], [place] [address]    Report Status 08/05/2023 FINAL  Final  Resp panel by RT-PCR (RSV, Flu A&B, Covid)  Anterior Nasal Swab     Status: None   Collection Time: 08/03/23 10:28 AM   Specimen: Anterior Nasal Swab  Result Value Ref Range Status   SARS Coronavirus 2 by RT PCR NEGATIVE NEGATIVE Final    Comment: (NOTE) SARS-CoV-2 target nucleic acids are NOT DETECTED.  The SARS-CoV-2 RNA is generally detectable in upper respiratory specimens during the acute phase of infection. The lowest concentration of SARS-CoV-2 viral copies this assay can detect is 138 copies/mL. A negative result does not preclude SARS-Cov-2 infection and should not be used as the sole basis for treatment or other patient management decisions. A negative result may occur with  improper specimen collection/handling, submission of specimen other than nasopharyngeal swab, presence of viral mutation(s) within the areas targeted by this assay, and inadequate number of viral copies(<138 copies/mL). A negative result must be combined with clinical observations, patient history, and epidemiological information. The expected result is Negative.  Fact Sheet for Patients:  BloggerCourse.com  Fact Sheet for Healthcare Providers:  SeriousBroker.it  This test is no t yet approved or cleared by the [place]  FDA and  has been authorized for detection and/or diagnosis of SARS-CoV-2 by FDA under an Emergency Use Authorization (EUA). This EUA will remain  in effect (meaning this test can be used) for the duration of the COVID-19 declaration under Section 564(b)(1) of the Act, 21 U.S.C.section 360bbb-3(b)(1), unless the authorization is terminated  or revoked sooner.       Influenza A by PCR NEGATIVE NEGATIVE Final   Influenza B by PCR NEGATIVE NEGATIVE Final    Comment: (NOTE) The Xpert Xpress SARS-CoV-2/FLU/RSV plus assay is intended as an aid in the diagnosis of influenza from Nasopharyngeal swab specimens and should not be used as a sole basis for treatment. Nasal washings  and aspirates are unacceptable for Xpert Xpress SARS-CoV-2/FLU/RSV testing.  Fact Sheet for Patients: BloggerCourse.com  Fact Sheet for Healthcare Providers: SeriousBroker.it  This test is not yet approved or cleared by the [place]  FDA and has been authorized for detection and/or diagnosis of SARS-CoV-2 by FDA under an Emergency Use Authorization (EUA). This EUA will remain in effect (meaning this test can be used) for the duration of the COVID-19 declaration under Section 564(b)(1) of the Act, 21 U.S.C. section 360bbb-3(b)(1), unless the authorization is terminated or revoked.     Resp Syncytial Virus by PCR NEGATIVE NEGATIVE Final    Comment: (NOTE) Fact Sheet for Patients: BloggerCourse.com  Fact Sheet for Healthcare Providers: SeriousBroker.it  This test is not yet approved or cleared by the [place]  FDA and has been authorized for detection and/or diagnosis of SARS-CoV-2 by FDA under an Emergency Use Authorization (EUA). This EUA will remain in effect (meaning this test can be used) for the duration of the COVID-19 declaration under Section 564(b)(1) of the Act, 21 U.S.C. section 360bbb-3(b)(1), unless the authorization is terminated or revoked.  Performed  at [facility], [address] [place], [place] [address]   Respiratory (~20 pathogens) panel by PCR     Status: Abnormal   Collection Time: 08/03/23 10:28 AM   Specimen: Nasopharyngeal Swab; Respiratory  Result Value Ref Range Status   Adenovirus DETECTED (A) NOT DETECTED Final   Coronavirus 229E NOT DETECTED NOT DETECTED Final    Comment: (NOTE) The Coronavirus on the Respiratory Panel, DOES NOT test for the novel  Coronavirus (2019 nCoV)    Coronavirus HKU1 NOT DETECTED NOT DETECTED Final   Coronavirus NL63 NOT DETECTED NOT DETECTED Final   Coronavirus OC43 NOT DETECTED NOT DETECTED Final    Metapneumovirus NOT DETECTED NOT DETECTED Final   Rhinovirus / Enterovirus NOT DETECTED NOT DETECTED Final   Influenza A NOT DETECTED NOT DETECTED Final   Influenza B NOT DETECTED NOT DETECTED Final   Parainfluenza Virus 1 NOT DETECTED NOT DETECTED Final   Parainfluenza Virus 2 NOT DETECTED NOT DETECTED Final   Parainfluenza Virus 3 NOT DETECTED NOT DETECTED Final   Parainfluenza Virus 4 NOT DETECTED NOT DETECTED Final   Respiratory Syncytial Virus NOT DETECTED NOT DETECTED Final   Bordetella pertussis NOT DETECTED NOT DETECTED Final   Bordetella Parapertussis NOT DETECTED NOT DETECTED Final   Chlamydophila pneumoniae NOT DETECTED NOT DETECTED Final   Mycoplasma pneumoniae NOT DETECTED NOT DETECTED Final    Comment: Performed at [facility], [address], [place], [place] [address]  MRSA Next Gen by PCR, Nasal     Status: None   Collection Time: 08/03/23  1:59 PM   Specimen: Nasal Mucosa; Nasal Swab  Result Value Ref Range Status   MRSA by PCR Next Gen NOT DETECTED NOT DETECTED Final    Comment: (NOTE) The GeneXpert MRSA Assay (FDA approved for NASAL specimens only), is one component of a comprehensive MRSA colonization surveillance program. It is not intended to diagnose MRSA infection nor to guide or monitor treatment for MRSA infections. Test performance is not FDA approved in patients less than 78 years old. Performed at [facility], [address], [place], [place] [address]    Imaging DG Chest [place] 1 View Result Date: 07/31/2023 CLINICAL DATA:  Fever. EXAM: PORTABLE CHEST 1 VIEW COMPARISON:  08/22/2011. FINDINGS: The heart size and mediastinal contours are within normal limits. No pneumothorax or pleural effusion. Stable medial right basilar density likely represents pericardial fat and is unchanged since 2013. Lungs are otherwise clear. Aorta is calcified. The visualized skeletal structures are unremarkable. IMPRESSION: No active disease. Electronically  Signed   By: Fonda Field M.D.   On: 07/31/2023 20:05      Time coordinating discharge: over 30 minutes  SIGNED:  Nickalos Petersen DO [facility]

## 2023-08-06 NOTE — Progress Notes (Signed)
 Pt's IVPB infusion complete; pt's IV removed per policy; pt to call her ride for discharge; verbally instructed pt to call when her ride is here in order for nursing to [facility]

## 2023-08-06 NOTE — Plan of Care (Signed)
  Problem: Fluid Volume: Goal: Hemodynamic stability will improve Outcome: Adequate for Discharge   Problem: Clinical Measurements: Goal: Diagnostic test results will improve Outcome: Adequate for Discharge Goal: Signs and symptoms of infection will decrease Outcome: Adequate for Discharge   Problem: Respiratory: Goal: Ability to maintain adequate ventilation will improve Outcome: Adequate for Discharge   Problem: Education: Goal: Knowledge of General Education information will improve Description: Including pain rating scale, medication(s)/side effects and non-pharmacologic comfort measures Outcome: Adequate for Discharge   Problem: Health Behavior/Discharge Planning: Goal: Ability to manage health-related needs will improve Outcome: Adequate for Discharge   Problem: Clinical Measurements: Goal: Ability to maintain clinical measurements within normal limits will improve Outcome: Adequate for Discharge Goal: Will remain free from infection Outcome: Adequate for Discharge Goal: Diagnostic test results will improve Outcome: Adequate for Discharge Goal: Respiratory complications will improve Outcome: Adequate for Discharge Goal: Cardiovascular complication will be avoided Outcome: Adequate for Discharge   Problem: Activity: Goal: Risk for activity intolerance will decrease Outcome: Adequate for Discharge   Problem: Nutrition: Goal: Adequate nutrition will be maintained Outcome: Adequate for Discharge   Problem: Coping: Goal: Level of anxiety will decrease Outcome: Adequate for Discharge   Problem: Elimination: Goal: Will not experience complications related to bowel motility Outcome: Adequate for Discharge Goal: Will not experience complications related to urinary retention Outcome: Adequate for Discharge   Problem: Pain Managment: Goal: General experience of comfort will improve and/or be controlled Outcome: Adequate for Discharge   Problem: Safety: Goal:  Ability to remain free from injury will improve Outcome: Adequate for Discharge   Problem: Skin Integrity: Goal: Risk for impaired skin integrity will decrease Outcome: Adequate for Discharge   Problem: Education: Goal: Knowledge of disease or condition will improve Outcome: Adequate for Discharge Goal: Understanding of medication regimen will improve Outcome: Adequate for Discharge Goal: Individualized Educational Video(s) Outcome: Adequate for Discharge   Problem: Activity: Goal: Ability to tolerate increased activity will improve Outcome: Adequate for Discharge   Problem: Cardiac: Goal: Ability to achieve and maintain adequate cardiopulmonary perfusion will improve Outcome: Adequate for Discharge   Problem: Health Behavior/Discharge Planning: Goal: Ability to safely manage health-related needs after discharge will improve Outcome: Adequate for Discharge   Problem: Activity: Goal: Ability to tolerate increased activity will improve Outcome: Adequate for Discharge   Problem: Clinical Measurements: Goal: Ability to maintain a body temperature in the normal range will improve Outcome: Adequate for Discharge   Problem: Respiratory: Goal: Ability to maintain adequate ventilation will improve Outcome: Adequate for Discharge Goal: Ability to maintain a clear airway will improve Outcome: Adequate for Discharge

## 2023-08-07 ENCOUNTER — Telehealth: Payer: Self-pay

## 2023-08-07 ENCOUNTER — Telehealth (HOSPITAL_COMMUNITY): Payer: Self-pay | Admitting: Pharmacy Technician

## 2023-08-07 NOTE — Telephone Encounter (Signed)
 Pharmacy Patient Advocate Encounter  Received notification from [employer] that Prior Authorization for Promethazine  HCl 25MG  tablets has been APPROVED from 08/07/2023 to 08/06/2024   PA #/Case ID/Reference #: [id number] Key: [id number]

## 2023-08-07 NOTE — Transitions of Care (Post Inpatient/ED Visit) (Signed)
 08/07/2023  Name: Lauren [name] MRN: [id number] DOB: March 15, 1945  Today's TOC FU Call Status: Today's TOC FU Call Status:: Successful TOC FU Call Completed TOC FU Call Complete Date: 08/07/23 Patient's Name and Date of Birth confirmed.  Transition Care Management Follow-up Telephone Call Date of Discharge: 08/06/23 Discharge Facility: [facility]) Type of Discharge: Inpatient Admission Primary Inpatient Discharge Diagnosis:: Traumatic Wound of lower leg with infection, left How have you been since you were released from the hospital?: Better Any questions or concerns?: No  Items Reviewed: Did you receive and understand the discharge instructions provided?: Yes Medications obtained,verified, and reconciled?: Yes (Medications Reviewed) Any new allergies since your discharge?: No Dietary orders reviewed?: No Do you have support at home?: Yes People in Home [RPT]: alone Name of Support/Comfort Primary Source: Friends, church  Medications Reviewed Today: Medications Reviewed Today     Reviewed by Eilleen Richerd GRADE, RN (Registered Nurse) on 08/07/23 at 1030  Med List Status: <None>   Medication Order Taking? Sig Documenting Provider Last Dose Status Informant  acetaminophen  (TYLENOL ) 500 MG tablet [id number] Yes Take 1,000 mg by mouth every 8 (eight) hours as needed for moderate pain (pain score 4-6). [provider]  Active Self  amoxicillin -clavulanate (AUGMENTIN ) 875-125 MG tablet [id number] Yes Take 1 tablet by mouth 2 (two) times daily for 7 days. Alexander, Natalie, DO  Active   apixaban  (ELIQUIS ) 5 MG TABS tablet [id number] Yes Take 1 tablet (5 mg total) by mouth 2 (two) times daily. Furth, Cadence H, PA-C  Active Self  azithromycin  (ZITHROMAX  Z-PAK) 250 MG tablet [id number] Yes Take 1 tablet (250 mg total) by mouth daily for 3 days. Alexander, Natalie, DO  Active   DULoxetine  (CYMBALTA ) 30 MG capsule [id number] Yes Take 1 capsule (30 mg total) by  mouth daily. Tower, Laine LABOR, MD  Active Self  Evolocumab  (REPATHA  SURECLICK) 140 MG/ML EMMANUEL [id number] Yes INJECT ONE DOSE SUBCUTANEOUSLY EVERY 14 DAYS Furth, Cadence H, PA-C  Active Self  gabapentin  (NEURONTIN ) 300 MG capsule [id number] Yes Take 2 by mouth in am and 3 in pm Tower, Laine LABOR, MD  Active Self  guaiFENesin -dextromethorphan  (ROBITUSSIN DM) 100-10 MG/5ML syrup [id number] Yes Take 10 mLs by mouth every 4 (four) hours as needed for cough. Alexander, Natalie, DO  Active   methocarbamol  (ROBAXIN ) 500 MG tablet [id number] Yes Take 1 tablet (500 mg total) by mouth 2 (two) times daily as needed for muscle spasms. Cauttion of sedation Tower, Laine LABOR, MD  Active Self  metoprolol  tartrate (LOPRESSOR ) 25 MG tablet [id number] Yes Take 1 tablet (25 mg total) by mouth 2 (two) times daily. Franchester, Cadence H, PA-C  Active Self  prednisoLONE acetate (PRED FORTE) 1 % ophthalmic suspension [id number] Yes Place 1 drop into the left eye 4 (four) times daily. [provider]  Active Self  promethazine  (PHENERGAN ) 25 MG tablet [id number] Yes Take 1 tablet (25 mg total) by mouth every 6 (six) hours as needed for nausea or vomiting. Alexander, Natalie, DO  Active           **08/07/23 patient corrected - Patient states she has NOT picked up Promethazine  yet but will later today especially if she needs it.  Home Care and Equipment/Supplies: Were Home Health Services Ordered?: No Any new equipment or medical supplies ordered?: No  Functional Questionnaire: Do you need assistance with bathing/showering or dressing?: No Do you need assistance with meal preparation?: No Do you need assistance with eating?: No Do you have difficulty maintaining continence:  No Do you need assistance with getting out of bed/getting out of a chair/moving?: No Do you have difficulty managing or taking your medications?: No  Follow up appointments reviewed: PCP Follow-up appointment confirmed?: Yes Date of PCP follow-up  appointment?: 08/17/23 (Appointment made by CMA now) Follow-up Provider: Arrive by 9:45 AMAppt at 10:00 AM (20 min) Corwin Antu, FNP [facility] Follow-up appointment confirmed?: NA Do you need transportation to your follow-up appointment?: No Do you understand care options if your condition(s) worsen?: Yes-patient verbalized understanding  SDOH Interventions Today    Flowsheet Row Most Recent Value  SDOH Interventions   Food Insecurity Interventions Intervention Not Indicated  Housing Interventions Intervention Not Indicated  Transportation Interventions Intervention Not Indicated  Utilities Interventions Intervention Not Indicated    Richerd Fish, RN, BSN, CCM [place]  [facility], [facility] RN Care Manager Direct Dial: [phone]

## 2023-08-15 DIAGNOSIS — H26491 Other secondary cataract, right eye: Secondary | ICD-10-CM | POA: Diagnosis not present

## 2023-08-17 ENCOUNTER — Ambulatory Visit: Admitting: Family

## 2023-08-17 ENCOUNTER — Encounter: Payer: Self-pay | Admitting: Family

## 2023-08-17 VITALS — BP 122/78 | HR 72 | Temp 97.6°F | Ht 68.0 in | Wt 231.8 lb

## 2023-08-17 DIAGNOSIS — J181 Lobar pneumonia, unspecified organism: Secondary | ICD-10-CM

## 2023-08-17 DIAGNOSIS — B3731 Acute candidiasis of vulva and vagina: Secondary | ICD-10-CM

## 2023-08-17 DIAGNOSIS — L03116 Cellulitis of left lower limb: Secondary | ICD-10-CM

## 2023-08-17 DIAGNOSIS — N898 Other specified noninflammatory disorders of vagina: Secondary | ICD-10-CM | POA: Diagnosis not present

## 2023-08-17 DIAGNOSIS — L03115 Cellulitis of right lower limb: Secondary | ICD-10-CM | POA: Diagnosis not present

## 2023-08-17 MED ORDER — FLUCONAZOLE 150 MG PO TABS: 150.0000 mg | ORAL_TABLET | Freq: Once | ORAL | 0 refills | Status: AC

## 2023-08-17 NOTE — Patient Instructions (Signed)
  Come in the week of the 28th for an XRAY

## 2023-08-17 NOTE — Assessment & Plan Note (Signed)
 Wet prep ordered today to confirm  Rx diflucan  150 mg x 1

## 2023-08-17 NOTE — Assessment & Plan Note (Signed)
 Overall symptom improvement and physical exam today unremarkable for abn lung sounds. Repeat cxr in the next 2-3 weeks too soon for repeat today Reviewed hospital visit as well as CXR

## 2023-08-17 NOTE — Assessment & Plan Note (Signed)
 Overall improving, wounds without s/s infection.  Pt advised to:  Please monitor site for worsening signs/symptoms of infection to include: increasing redness, increasing tenderness, increase in size, and or pustulant drainage from site. If this is to occur please let me know immediately.  Continue medications as prescribed. Monitor for s/s CDIFF as had had multiple antibiotics, pt made aware of symptoms.  Currently asymptomatic

## 2023-08-17 NOTE — Progress Notes (Signed)
 Established Patient Office Visit  Subjective:      CC:  Chief Complaint  Patient presents with   Hospitalization Follow-up    HPI: Lauren [name] is a 78 y.o. female presenting on 08/17/2023 for Hospitalization Follow-up  Went to the ER 6/23 discharged 08/06/23. Went in for cellulitis secondary to infected wound bil lower legs as she had previously climbed out of the pool and had abrasion on her lower legs. Had had two courses of antbx prior to Er (doxycycline  x 7 days bid and bactrim  bid for 7 days. Given rocephin  and vancomycin  first day of admission, still with fever 6/25 so was kept in for monitoring however 6/26 worsening cough CXR performed with lobar pneumonia + adenovirus, Cxr repeated 6/28 without improvement however pt was feeling better, and was discharged 6/29.   Dx: lobar pneumonia Discharged with augmentin  and azithromycin  given an antitussive and IC and flutter valve, of which she was compliant with. Advised via discharge summary need for outpatient repeat CXR with pcp. Cellulitis with improvement. Afib with RVR, chronic,. Discharged and advised to continue lopressor  25 mg bid and continue eliquis .   Had low potassium inpatient, outpt monitoring of K recommended. However 12 days ago 6/28 potassium WNL improved from 3.0 . Cbc 6/26 without leukocytosis   Today in office feeling much better, no wheezing. No sob. No longer needing to use her IC and or flutter valve. No fever. Not sleeping which is baseline however slightly worse. Cellulitis has improved bi lLE but she does report ongoing fatigue since all of this. She was given diflucan  in the hospital for a yeast infection and she took x one pill diflucan . She is taking kefir and kombucha but she is still having vaginal itching and no vaginal discharge.        Social history:  Relevant past medical, surgical, family and social history reviewed and updated as indicated. Interim medical history since our last visit  reviewed.  Allergies and medications reviewed and updated.  DATA REVIEWED: CHART IN EPIC     ROS: Negative unless specifically indicated above in HPI.    Current Outpatient Medications:    acetaminophen  (TYLENOL ) 500 MG tablet, Take 1,000 mg by mouth every 8 (eight) hours as needed for moderate pain (pain score 4-6)., Disp: , Rfl:    apixaban  (ELIQUIS ) 5 MG TABS tablet, Take 1 tablet (5 mg total) by mouth 2 (two) times daily., Disp: 180 tablet, Rfl: 3   DULoxetine  (CYMBALTA ) 30 MG capsule, Take 1 capsule (30 mg total) by mouth daily., Disp: 90 capsule, Rfl: 3   Evolocumab  (REPATHA  SURECLICK) 140 MG/ML SOAJ, INJECT ONE DOSE SUBCUTANEOUSLY EVERY 14 DAYS, Disp: 6 mL, Rfl: 3   fluconazole  (DIFLUCAN ) 150 MG tablet, Take 1 tablet (150 mg total) by mouth once for 1 dose., Disp: 1 tablet, Rfl: 0   gabapentin  (NEURONTIN ) 300 MG capsule, Take 2 by mouth in am and 3 in pm, Disp: 450 capsule, Rfl: 2   methocarbamol  (ROBAXIN ) 500 MG tablet, Take 1 tablet (500 mg total) by mouth 2 (two) times daily as needed for muscle spasms. Cauttion of sedation, Disp: 90 tablet, Rfl: 1   metoprolol  tartrate (LOPRESSOR ) 25 MG tablet, Take 1 tablet (25 mg total) by mouth 2 (two) times daily., Disp: 180 tablet, Rfl: 3   prednisoLONE acetate (PRED FORTE) 1 % ophthalmic suspension, Place 1 drop into the left eye 4 (four) times daily., Disp: , Rfl:    promethazine  (PHENERGAN ) 25 MG tablet, Take 1 tablet (25 mg  total) by mouth every 6 (six) hours as needed for nausea or vomiting., Disp: 15 tablet, Rfl: 0      Objective:    BP 122/78   Pulse 72   Temp 97.6 F (36.4 C) (Oral)   Ht 5' 8 (1.727 m)   Wt 231 lb 12.8 oz (105.1 kg)   SpO2 98%   BMI 35.25 kg/m   Wt Readings from Last 3 Encounters:  08/17/23 231 lb 12.8 oz (105.1 kg)  08/01/23 232 lb 9.4 oz (105.5 kg)  07/28/23 235 lb 6 oz (106.8 kg)    Physical Exam Vitals reviewed.  Constitutional:      General: She is not in acute distress.    Appearance:  Normal appearance. She is normal weight. She is not ill-appearing, toxic-appearing or diaphoretic.  HENT:     Head: Normocephalic.  Cardiovascular:     Rate and Rhythm: Normal rate and regular rhythm.  Pulmonary:     Effort: Pulmonary effort is normal.     Breath sounds: Normal breath sounds.  Musculoskeletal:        General: Normal range of motion.     Right lower leg: 1+ Edema present.     Left lower leg: No edema.  Skin:    Comments: Right lower ext shin with some erythema not very warm to touch just slightly healing well scabbing over edges approximated   Left lower extremity lower shin with healing wound without erythema   Neurological:     General: No focal deficit present.     Mental Status: She is alert and oriented to person, place, and time. Mental status is at baseline.  Psychiatric:        Mood and Affect: Mood normal.        Behavior: Behavior normal.        Thought Content: Thought content normal.        Judgment: Judgment normal.                    Assessment & Plan:  Vaginal candida Assessment & Plan: Wet prep ordered today to confirm  Rx diflucan  150 mg x 1  Orders: -     Fluconazole ; Take 1 tablet (150 mg total) by mouth once for 1 dose.  Dispense: 1 tablet; Refill: 0 -     WET PREP BY MOLECULAR PROBE  Vaginal itching -     WET PREP BY MOLECULAR PROBE  Lobar pneumonia (HCC) Assessment & Plan: Overall symptom improvement and physical exam today unremarkable for abn lung sounds. Repeat cxr in the next 2-3 weeks too soon for repeat today Reviewed hospital visit as well as CXR    Orders: -     DG Chest 2 View; Future  Cellulitis of both lower extremities Assessment & Plan: Overall improving, wounds without s/s infection.  Pt advised to:  Please monitor site for worsening signs/symptoms of infection to include: increasing redness, increasing tenderness, increase in size, and or pustulant drainage from site. If this is to occur please let  me know immediately.  Continue medications as prescribed. Monitor for s/s CDIFF as had had multiple antibiotics, pt made aware of symptoms.  Currently asymptomatic      Return in about 1 month (around 09/17/2023) for with PCP .  Ginger Patrick, MSN, APRN, FNP-C [facility]

## 2023-08-18 LAB — WET PREP BY MOLECULAR PROBE
Candida species: NOT DETECTED
Gardnerella vaginalis: NOT DETECTED
MICRO NUMBER:: 16682498
SPECIMEN QUALITY:: ADEQUATE
Trichomonas vaginosis: NOT DETECTED

## 2023-08-21 ENCOUNTER — Ambulatory Visit: Payer: Self-pay | Admitting: Family

## 2023-08-22 DIAGNOSIS — M3509 Sicca syndrome with other organ involvement: Secondary | ICD-10-CM | POA: Diagnosis not present

## 2023-08-22 DIAGNOSIS — M150 Primary generalized (osteo)arthritis: Secondary | ICD-10-CM | POA: Diagnosis not present

## 2023-08-22 DIAGNOSIS — M791 Myalgia, unspecified site: Secondary | ICD-10-CM | POA: Diagnosis not present

## 2023-09-04 ENCOUNTER — Ambulatory Visit
Admission: RE | Admit: 2023-09-04 | Discharge: 2023-09-04 | Disposition: A | Discharge status: Home / Self Care | Source: Ambulatory Visit | Attending: Family

## 2023-09-04 DIAGNOSIS — J181 Lobar pneumonia, unspecified organism: Secondary | ICD-10-CM | POA: Diagnosis not present

## 2023-09-04 DIAGNOSIS — R918 Other nonspecific abnormal finding of lung field: Secondary | ICD-10-CM | POA: Diagnosis not present

## 2023-09-15 ENCOUNTER — Encounter: Payer: Self-pay | Admitting: Family Medicine

## 2023-09-15 ENCOUNTER — Ambulatory Visit (INDEPENDENT_AMBULATORY_CARE_PROVIDER_SITE_OTHER)
Admission: RE | Admit: 2023-09-15 | Discharge: 2023-09-15 | Disposition: A | Discharge status: Home / Self Care | Source: Ambulatory Visit | Attending: Family Medicine | Admitting: Family Medicine

## 2023-09-15 ENCOUNTER — Ambulatory Visit (INDEPENDENT_AMBULATORY_CARE_PROVIDER_SITE_OTHER): Admitting: Family Medicine

## 2023-09-15 ENCOUNTER — Ambulatory Visit: Payer: Self-pay | Admitting: Family Medicine

## 2023-09-15 VITALS — BP 124/80 | HR 66 | Temp 97.9°F | Ht 68.0 in | Wt 234.0 lb

## 2023-09-15 DIAGNOSIS — L03115 Cellulitis of right lower limb: Secondary | ICD-10-CM

## 2023-09-15 DIAGNOSIS — J181 Lobar pneumonia, unspecified organism: Secondary | ICD-10-CM | POA: Diagnosis not present

## 2023-09-15 DIAGNOSIS — L03116 Cellulitis of left lower limb: Secondary | ICD-10-CM

## 2023-09-15 DIAGNOSIS — R918 Other nonspecific abnormal finding of lung field: Secondary | ICD-10-CM | POA: Diagnosis not present

## 2023-09-15 NOTE — Assessment & Plan Note (Signed)
 Reviewed hospital records, lab results and studies in detail  This appears to be resolved  Legs look normal/wounds are healed entirely   Gave the ok to get back into the pool  Call back and Er precautions noted in detail today

## 2023-09-15 NOTE — Patient Instructions (Signed)
 Glad you are feeling better  Get back to exercise as tolerated    Chest xray today  We will reach out with results

## 2023-09-15 NOTE — Assessment & Plan Note (Signed)
 Reviewed hospital records and notes /results from NP Dugal  (hosp for cellulitis and this) Treatment with augmentin  and azithro Much clinical improvement - back to normal with a little fatigue and anxious to get back to water aerobics Normal exam  Last cxr from 7/28 noted some improvement in patchy consolidative opacities in right mid and lower lung but not full resolution We will re check this today  If not improved- may consider CT No prior pulm history besides osa  No history of aspiratoin

## 2023-09-15 NOTE — Progress Notes (Signed)
 Subjective:    Patient ID: Lauren [name], female    DOB: September 27, 1945, 78 y.o.   MRN: [id number]  HPI  Wt Readings from Last 3 Encounters:  09/15/23 234 lb (106.1 kg)  08/17/23 231 lb 12.8 oz (105.1 kg)  08/01/23 232 lb 9.4 oz (105.5 kg)   35.58 kg/m  Vitals:   09/15/23 0926  BP: 124/80  Pulse: 66  Temp: 97.9 F (36.6 C)  SpO2: 96%   Pt presents for follow up of pneumonia    Seen by NP Dugal for hospitalization follow up (cellulitis) on 7/10 Was also dx with lobar pneumoina durig that hospitalization  Treated with augmentin  and azithro , anti tussive nad IC and flutter valve   CXR 7/28 noted improvement but not resolution   DG Chest 2 View Result Date: 09/09/2023 CLINICAL DATA:  Pneumonia EXAM: CHEST - 2 VIEW COMPARISON:  Chest radiograph 08/05/2023 FINDINGS: Stable cardiac and mediastinal contours. Improving but not resolved patchy consolidative opacities within the right mid and lower lung. No pleural effusion or pneumothorax. Thoracic spine degenerative changes. IMPRESSION: Improving but not resolved patchy consolidative opacities within the right mid and lower lung. Electronically Signed   By: Bard Moats M.D.   On: 09/09/2023 13:26    Has never smoked   Today- feeling better (just a little tired)  Wounds look healed  Healed / no open areas  Wants to return to water aerobics   No cough  No shortness of breath No wheezing   Patient Active Problem List   Diagnosis Date Noted   Cellulitis of both lower extremities 08/17/2023   Vaginal candida 08/17/2023   Lobar pneumonia (HCC) 08/17/2023   Traumatic wound of lower leg with infection, left, initial encounter 08/01/2023   SIRS (systemic inflammatory response syndrome) (HCC) 07/31/2023   Subclinical hyperthyroidism 04/18/2023   Abnormal TSH 04/17/2023   Pseudophakia of both eyes 05/26/2022   IBS (irritable bowel syndrome) 05/24/2021   Chronic atrial fibrillation with RVR (HCC) 09/29/2020   Myofascial pain  09/29/2020   History of CVA (cerebrovascular accident) 06/13/2020   DNR (do not resuscitate) 06/13/2020   Dermatochalasis of both upper eyelids 07/15/2019   Ptosis of both upper eyelids 07/15/2019   Carpal tunnel syndrome 01/25/2019   Vitamin B12 deficiency 03/27/2018   Sjogren's disease (HCC) 01/15/2018   Osteoarthritis 01/15/2018   Obesity (BMI 30-39.9) 01/15/2018   H/O herpes zoster 11/22/2017   Estrogen deficiency 01/09/2017   Routine general medical examination at a health care facility 01/01/2017   Obstructive sleep apnea 09/18/2016   Bee sting allergy 06/10/2014   History of shingles 11/27/2013   Hx of breast cancer 08/22/2011   Dry eye syndrome of bilateral lacrimal glands 01/20/2011   Sjogren's syndrome (HCC) 04/23/2008   Vitamin D  deficiency 04/22/2008   Hyperlipidemia 04/22/2008   GERD 04/22/2008   Prediabetes 04/22/2008   Past Medical History:  Diagnosis Date   Breast cancer (HCC) 2002   DCIS; BRCA 1 pos HER2 pos, ER2 pos   Cataract Removed 2021   DDD (degenerative disc disease)    DNR (do not resuscitate) 06/13/2020   GERD (gastroesophageal reflux disease)    History of echocardiogram    a. 06/2020 Echo: EF 55-60%, no rwma, nl RV size/fxn. Mildly dil LA.   HLD (hyperlipidemia)    statin intolerant   Hyperglycemia 10/2007   A1C elvated to 6.2   Myocardial infarction [facility])    Neuromuscular disorder (HCC) Carpal Tunnel L and R   Obstructive sleep apnea  Osteoarthritis    Osteopenia 2007   ? from xray with nl dexa   Oxygen deficiency 10/18   Persistent atrial fibrillation (HCC)    a. Dx 06/2020 in setting of stroke. CHA2DS2VASc = 5-->Eliquis ; b. 08/2020 Successful DCCV; c. 09/2020 Office f/u- pt in rate-controlled Afib.   Reactive depression (situational)    very well controlled, and now takes prozac  for sleep   Sjogren's syndrome (HCC)    Sleep apnea 10/18   Stroke (HCC) 06/2020   Urine incontinence    Vitamin D  deficiency    Past Surgical History:   Procedure Laterality Date   BILATERAL OOPHORECTOMY  03/2003   BREAST SURGERY  Bilateral breast removed 2001   CARDIOVERSION N/A 08/24/2020   Procedure: CARDIOVERSION;  Surgeon: Darron Deatrice LABOR, MD;  Location: [facility] ORS;  Service: Cardiovascular;  Laterality: N/A;   CATARACT EXTRACTION, BILATERAL     10/2019, 12/2019   COLONOSCOPY  06/2005   normal; recheck 3 years   EYE SURGERY  cataracts 2021   JOINT REPLACEMENT  Bilateral knee 2006 hip 2010   LUMBAR DISC SURGERY  1991   Ruptured disk L5   MASTECTOMY  2001   bilateral   TONSILLECTOMY  1974   TOTAL HIP ARTHROPLASTY  2011   TOTAL KNEE ARTHROPLASTY  2006   bilateral   TUBAL LIGATION  1982   Social History   Tobacco Use   Smoking status: Never   Smokeless tobacco: Never  Vaping Use   Vaping status: Never Used  Substance Use Topics   Alcohol use: Not Currently    Comment: rare, none in 2 years   Drug use: Never   Family History  Problem Relation Age of Onset   Arthritis Other        family hx   Breast cancer Other        1st degree relative <50   Diabetes Other        1st degree relative   Hyperlipidemia Other        family hx   Hypertension Other        family hx   Ovarian cancer Other        family hx   Prostate cancer Other        1st degree relative <50   Dementia Mother    Cancer Mother    Hearing loss Mother    Psoriasis Brother    Cancer Brother    Obesity Brother    Other Daughter        BRCA gene   ADD / ADHD Daughter    Cancer Daughter    Brain cancer Brother    Cancer Brother    Stroke Maternal Grandmother    Heart disease Maternal Grandmother    Arthritis Father    Cancer Father    Hearing loss Father    Alcohol abuse Maternal Grandfather    Allergies  Allergen Reactions   Crestor  [Rosuvastatin ]     myalgias   Lipitor [Atorvastatin]     myalgias   Statins     joint pain   Tramadol      Night mares , does not help pain    Wasp Venom Swelling   Current Outpatient Medications on  File Prior to Visit  Medication Sig Dispense Refill   acetaminophen  (TYLENOL ) 500 MG tablet Take 1,000 mg by mouth every 8 (eight) hours as needed for moderate pain (pain score 4-6).     apixaban  (ELIQUIS ) 5 MG TABS tablet Take 1 tablet (  5 mg total) by mouth 2 (two) times daily. 180 tablet 3   DULoxetine  (CYMBALTA ) 30 MG capsule Take 1 capsule (30 mg total) by mouth daily. 90 capsule 3   Evolocumab  (REPATHA  SURECLICK) 140 MG/ML SOAJ INJECT ONE DOSE SUBCUTANEOUSLY EVERY 14 DAYS 6 mL 3   gabapentin  (NEURONTIN ) 300 MG capsule Take 2 by mouth in am and 3 in pm 450 capsule 2   methocarbamol  (ROBAXIN ) 500 MG tablet Take 1 tablet (500 mg total) by mouth 2 (two) times daily as needed for muscle spasms. Cauttion of sedation 90 tablet 1   metoprolol  tartrate (LOPRESSOR ) 25 MG tablet Take 1 tablet (25 mg total) by mouth 2 (two) times daily. 180 tablet 3   No current facility-administered medications on file prior to visit.    Review of Systems  Constitutional:  Negative for activity change, appetite change, fatigue, fever and unexpected weight change.  HENT:  Negative for congestion, ear pain, rhinorrhea, sinus pressure and sore throat.   Eyes:  Negative for pain, redness and visual disturbance.  Respiratory:  Negative for cough, choking, chest tightness, shortness of breath, wheezing and stridor.   Cardiovascular:  Negative for chest pain and palpitations.  Gastrointestinal:  Negative for abdominal pain, blood in stool, constipation and diarrhea.  Endocrine: Negative for polydipsia and polyuria.  Genitourinary:  Negative for dysuria, frequency and urgency.  Musculoskeletal:  Negative for arthralgias, back pain and myalgias.  Skin:  Negative for pallor and rash.       Healed wounds on legs   Allergic/Immunologic: Negative for environmental allergies.  Neurological:  Negative for dizziness, syncope and headaches.  Hematological:  Negative for adenopathy. Does not bruise/bleed easily.   Psychiatric/Behavioral:  Negative for decreased concentration and dysphoric mood. The patient is not nervous/anxious.        Objective:   Physical Exam Constitutional:      General: She is not in acute distress.    Appearance: Normal appearance. She is well-developed. She is obese. She is not ill-appearing.  HENT:     Head: Normocephalic and atraumatic.  Eyes:     Conjunctiva/sclera: Conjunctivae normal.     Pupils: Pupils are equal, round, and reactive to light.  Neck:     Thyroid : No thyromegaly.     Vascular: No carotid bruit or JVD.  Cardiovascular:     Rate and Rhythm: Normal rate.     Heart sounds: Normal heart sounds.     No gallop.  Pulmonary:     Effort: Pulmonary effort is normal. No respiratory distress.     Breath sounds: Normal breath sounds. No stridor. No wheezing, rhonchi or rales.     Comments: Good air exch Abdominal:     General: There is no distension or abdominal bruit.     Palpations: Abdomen is soft.  Musculoskeletal:     Cervical back: Normal range of motion and neck supple.     Right lower leg: No edema.     Left lower leg: No edema.  Lymphadenopathy:     Cervical: No cervical adenopathy.  Skin:    General: Skin is warm and dry.     Coloration: Skin is not pale.     Findings: No rash.     Comments: Wounds on bilateral shins are entirely healed  Some scarring No erythema or swelling    Neurological:     Mental Status: She is alert.     Coordination: Coordination normal.     Deep Tendon Reflexes: Reflexes are normal and  symmetric. Reflexes normal.  Psychiatric:        Mood and Affect: Mood normal.           Assessment & Plan:   Problem List Items Addressed This Visit       Respiratory   Lobar pneumonia (HCC) - Primary   Reviewed hospital records and notes /results from NP Dugal  (hosp for cellulitis and this) Treatment with augmentin  and azithro Much clinical improvement - back to normal with a little fatigue and anxious to get  back to water aerobics Normal exam  Last cxr from 7/28 noted some improvement in patchy consolidative opacities in right mid and lower lung but not full resolution We will re check this today  If not improved- may consider CT No prior pulm history besides osa  No history of aspiratoin       Relevant Orders   DG Chest 2 View     Other   Cellulitis of both lower extremities   Reviewed hospital records, lab results and studies in detail  This appears to be resolved  Legs look normal/wounds are healed entirely   Gave the ok to get back into the pool  Call back and Er precautions noted in detail today

## 2023-09-18 ENCOUNTER — Encounter: Payer: Self-pay | Admitting: Family Medicine

## 2023-09-20 ENCOUNTER — Ambulatory Visit: Payer: Self-pay | Admitting: Family Medicine

## 2023-09-20 ENCOUNTER — Ambulatory Visit
Admission: RE | Admit: 2023-09-20 | Discharge: 2023-09-20 | Disposition: A | Discharge status: Home / Self Care | Source: Ambulatory Visit | Attending: Family Medicine | Admitting: Family Medicine

## 2023-09-20 DIAGNOSIS — J181 Lobar pneumonia, unspecified organism: Secondary | ICD-10-CM

## 2023-09-20 DIAGNOSIS — J9811 Atelectasis: Secondary | ICD-10-CM | POA: Diagnosis not present

## 2023-10-02 ENCOUNTER — Ambulatory Visit (INDEPENDENT_AMBULATORY_CARE_PROVIDER_SITE_OTHER): Admitting: "Endocrinology

## 2023-10-02 ENCOUNTER — Ambulatory Visit: Payer: Self-pay

## 2023-10-02 ENCOUNTER — Encounter: Payer: Self-pay | Admitting: "Endocrinology

## 2023-10-02 VITALS — BP 130/80 | HR 84 | Ht 68.0 in | Wt 236.0 lb

## 2023-10-02 DIAGNOSIS — E0590 Thyrotoxicosis, unspecified without thyrotoxic crisis or storm: Secondary | ICD-10-CM | POA: Diagnosis not present

## 2023-10-02 LAB — TSH+FREE T4: TSH W/REFLEX TO FT4: 0.63 m[IU]/L (ref 0.40–4.50)

## 2023-10-02 LAB — T3, FREE: T3, Free: 3.3 pg/mL (ref 2.3–4.2)

## 2023-10-02 NOTE — Progress Notes (Signed)
 Outpatient Endocrinology Note Lauren Birmingham, MD  10/02/23   Lauren [name] 1945/10/12 [id number]  Referring Provider: Randeen Laine LABOR, MD Primary Care Provider: Randeen Laine LABOR, MD Subjective  No chief complaint on file.   Assessment & Plan  Diagnoses and all orders for this visit:  Subclinical hyperthyroidism -     TSH + free T4 -     TRAb (TSH Receptor Binding Antibody) -     T3, free -     Thyroid  stimulating immunoglobulin   Lauren [name] is currently taking no thyroid  medication. Has history of A.fib (no osteoporosis). Patient was biochemically subclinically hyperthyroid.  Discussed the etiology for hyperthyroidism. Educated on thyroid  axis.  Recommend the following: labs today. Discussed methimazole with side effects if needed. Repeat labs in 3 months or sooner if symptoms of hyper or hypothyroidism develop.  Counseled on: -complications of untreated hyperthyroidism including atrial fibrillation, heart failure and osteoporosis -side effects of Methimazole including but not limited to allergic reaction, rash, bone marrow suppression, liver dysfunction  -compliance and follow up needs    If you notice any symptoms of worsening fatigue, fever with sore throat, loss of appetite, yellowing of eyes, dark urine, joint pains, sores in the mouth, itchy rash, light colored stools or abdominal pain, please stop the medication and call us  immediately as this can be a serious side effect of the medication.   I have reviewed current medications, nurse's notes, allergies, vital signs, past medical and surgical history, family medical history, and social history for this encounter. Counseled patient on symptoms, examination findings, lab findings, imaging results, treatment decisions and monitoring and prognosis. The patient understood the recommendations and agrees with the treatment plan. All questions regarding treatment plan were fully answered.   Return in about 6 months  (around 04/03/2024) for visit, labs today.   Lauren Birmingham, MD  10/02/23   I have reviewed current medications, nurse's notes, allergies, vital signs, past medical and surgical history, family medical history, and social history for this encounter. Counseled patient on symptoms, examination findings, lab findings, imaging results, treatment decisions and monitoring and prognosis. The patient understood the recommendations and agrees with the treatment plan. All questions regarding treatment plan were fully answered.   History of Present Illness Lauren [name] is a 78 y.o. year old female who presents to our clinic with subclinical hyperthyroidism diagnosed in 04/2023.    Never been on thyroid  medication  Symptoms suggestive of HYPOTHYROIDISM:  fatigue No weight gain No cold intolerance  No constipation  Yes, intermittent   Symptoms suggestive of HYPERTHYROIDISM:  weight loss  No heat intolerance No hyperdefecation  No palpitations  Yes, has A.fib  Compressive symptoms:  dysphagia  No dysphonia  No positional dyspnea (especially with simultaneous arms elevation)  No  Smokes  No On biotin  No Personal history of head/neck surgery/irradiation  No  Physical Exam  BP 130/80   Pulse 84   Ht 5' 8 (1.727 m)   Wt 236 lb (107 kg)   SpO2 96%   BMI 35.88 kg/m  Constitutional: well developed, well nourished Head: normocephalic, atraumatic, no exophthalmos Eyes: sclera anicteric, no redness Neck: no thyromegaly, no thyroid  tenderness; no nodules palpated Lungs: normal respiratory effort Neurology: alert and oriented, no fine hand tremor Skin: dry, no appreciable rashes Musculoskeletal: no appreciable defects Psychiatric: normal mood and affect  Allergies Allergies  Allergen Reactions   Crestor  [Rosuvastatin ]     myalgias   Lipitor [Atorvastatin]     myalgias  Statins     joint pain   Tramadol      Night mares , does not help pain    Wasp Venom Swelling    Current  Medications Patient's Medications  New Prescriptions   No medications on file  Previous Medications   ACETAMINOPHEN  (TYLENOL ) 500 MG TABLET    Take 1,000 mg by mouth every 8 (eight) hours as needed for moderate pain (pain score 4-6).   APIXABAN  (ELIQUIS ) 5 MG TABS TABLET    Take 1 tablet (5 mg total) by mouth 2 (two) times daily.   DULOXETINE  (CYMBALTA ) 30 MG CAPSULE    Take 1 capsule (30 mg total) by mouth daily.   EVOLOCUMAB  (REPATHA  SURECLICK) 140 MG/ML SOAJ    INJECT ONE DOSE SUBCUTANEOUSLY EVERY 14 DAYS   GABAPENTIN  (NEURONTIN ) 300 MG CAPSULE    Take 2 by mouth in am and 3 in pm   METHOCARBAMOL  (ROBAXIN ) 500 MG TABLET    Take 1 tablet (500 mg total) by mouth 2 (two) times daily as needed for muscle spasms. Cauttion of sedation   METOPROLOL  TARTRATE (LOPRESSOR ) 25 MG TABLET    Take 1 tablet (25 mg total) by mouth 2 (two) times daily.  Modified Medications   No medications on file  Discontinued Medications   No medications on file    Past Medical History Past Medical History:  Diagnosis Date   Breast cancer (HCC) 2002   DCIS; BRCA 1 pos HER2 pos, ER2 pos   Cataract Removed 2021   DDD (degenerative disc disease)    DNR (do not resuscitate) 06/13/2020   GERD (gastroesophageal reflux disease)    History of echocardiogram    a. 06/2020 Echo: EF 55-60%, no rwma, nl RV size/fxn. Mildly dil LA.   HLD (hyperlipidemia)    statin intolerant   Hyperglycemia 10/2007   A1C elvated to 6.2   Myocardial infarction [facility])    Neuromuscular disorder (HCC) Carpal Tunnel L and R   Obstructive sleep apnea    Osteoarthritis    Osteopenia 2007   ? from xray with nl dexa   Oxygen deficiency 10/18   Persistent atrial fibrillation (HCC)    a. Dx 06/2020 in setting of stroke. CHA2DS2VASc = 5-->Eliquis ; b. 08/2020 Successful DCCV; c. 09/2020 Office f/u- pt in rate-controlled Afib.   Reactive depression (situational)    very well controlled, and now takes prozac  for sleep   Sjogren's syndrome (HCC)     Sleep apnea 10/18   Stroke (HCC) 06/2020   Urine incontinence    Vitamin D  deficiency     Past Surgical History Past Surgical History:  Procedure Laterality Date   BILATERAL OOPHORECTOMY  03/2003   BREAST SURGERY  Bilateral breast removed 2001   CARDIOVERSION N/A 08/24/2020   Procedure: CARDIOVERSION;  Surgeon: Darron Deatrice LABOR, MD;  Location: [facility] ORS;  Service: Cardiovascular;  Laterality: N/A;   CATARACT EXTRACTION, BILATERAL     10/2019, 12/2019   COLONOSCOPY  06/2005   normal; recheck 3 years   EYE SURGERY  cataracts 2021   JOINT REPLACEMENT  Bilateral knee 2006 hip 2010   LUMBAR DISC SURGERY  1991   Ruptured disk L5   MASTECTOMY  2001   bilateral   TONSILLECTOMY  1974   TOTAL HIP ARTHROPLASTY  2011   TOTAL KNEE ARTHROPLASTY  2006   bilateral   TUBAL LIGATION  1982    Family History family history includes ADD / ADHD in her daughter; Alcohol abuse in her maternal grandfather; Arthritis in  her father and another family member; Brain cancer in her brother; Breast cancer in an other family member; Cancer in her brother, brother, daughter, father, and mother; Dementia in her mother; Diabetes in an other family member; Hearing loss in her father and mother; Heart disease in her maternal grandmother; Hyperlipidemia in an other family member; Hypertension in an other family member; Obesity in her brother; Other in her daughter; Ovarian cancer in an other family member; Prostate cancer in an other family member; Psoriasis in her brother; Stroke in her maternal grandmother.  Social History Social History   Socioeconomic History   Marital status: Widowed    Spouse name: Not on file   Number of children: 3   Years of education: Not on file   Highest education level: Master's degree (e.g., MA, MS, MEng, MEd, MSW, MBA)  Occupational History   Occupation: Retired  Tobacco Use   Smoking status: Never   Smokeless tobacco: Never  Vaping Use   Vaping status: Never Used  Substance  and Sexual Activity   Alcohol use: Not Currently    Comment: rare, none in 2 years   Drug use: Never   Sexual activity: Not Currently  Other Topics Concern   Not on file  Social History Narrative   Retired Psychiatric nurse from [employer]      Divorced      G3P3      Regular exercise-yes   Social Drivers of Health   Financial Resource Strain: Low Risk  (07/24/2023)   Overall Financial Resource Strain (CARDIA)    Difficulty of Paying Living Expenses: Not hard at all  Food Insecurity: No Food Insecurity (08/07/2023)   Hunger Vital Sign    Worried About Running Out of Food in the Last Year: Never true    Ran Out of Food in the Last Year: Never true  Transportation Needs: No Transportation Needs (08/07/2023)   PRAPARE - Administrator, Civil Service (Medical): No    Lack of Transportation (Non-Medical): No  Physical Activity: Inactive (07/24/2023)   Exercise Vital Sign    Days of Exercise per Week: 3 days    Minutes of Exercise per Session: 0 min  Stress: No Stress Concern Present (07/24/2023)   [employer] of Occupational Health - Occupational Stress Questionnaire    Feeling of Stress: Only a little  Social Connections: Moderately Integrated (08/01/2023)   Social Connection and Isolation Panel    Frequency of Communication with Friends and Family: More than three times a week    Frequency of Social Gatherings with Friends and Family: More than three times a week    Attends Religious Services: More than 4 times per year    Active Member of [employer] or Organizations: Yes    Attends Banker Meetings: More than 4 times per year    Marital Status: Widowed  Intimate Partner Violence: Not At Risk (08/07/2023)   Humiliation, Afraid, Rape, and Kick questionnaire    Fear of Current or Ex-Partner: No    Emotionally Abused: No    Physically Abused: No    Sexually Abused: No    Laboratory Investigations Lab Results  Component Value Date    TSH 0.27 (L) 04/17/2023   TSH 0.32 (L) 04/14/2023   TSH 0.86 02/12/2021   FREET4 0.70 04/17/2023     No results found for: TSI   No components found for: TRAB   Lab Results  Component Value Date   CHOL  138 04/14/2023   Lab Results  Component Value Date   HDL 53.00 04/14/2023   Lab Results  Component Value Date   LDLCALC 66 04/14/2023   Lab Results  Component Value Date   TRIG 96.0 04/14/2023   Lab Results  Component Value Date   CHOLHDL 3 04/14/2023   Lab Results  Component Value Date   CREATININE 0.51 08/05/2023   Lab Results  Component Value Date   GFR 87.30 04/17/2023      Component Value Date/Time   NA 139 08/05/2023 0431   NA 137 03/05/2021 1020   NA 140 10/31/2012 0821   K 3.8 08/05/2023 0431   K 4.3 10/31/2012 0821   CL 105 08/05/2023 0431   CL 103 10/26/2011 0957   CO2 26 08/05/2023 0431   CO2 22 10/31/2012 0821   GLUCOSE 90 08/05/2023 0431   GLUCOSE 89 10/31/2012 0821   GLUCOSE 103 (H) 10/26/2011 0957   BUN 11 08/05/2023 0431   BUN 9 03/05/2021 1020   BUN 12.1 10/31/2012 0821   CREATININE 0.51 08/05/2023 0431   CREATININE 0.7 10/31/2012 0821   CALCIUM  8.3 (L) 08/05/2023 0431   CALCIUM  9.6 10/31/2012 0821   PROT 7.4 07/31/2023 1936   PROT 6.9 03/05/2021 1020   PROT 7.4 10/31/2012 0821   ALBUMIN 3.6 07/31/2023 1936   ALBUMIN 4.1 03/05/2021 1020   ALBUMIN 3.6 10/31/2012 0821   AST 17 07/31/2023 1936   AST 15 10/31/2012 0821   ALT 11 07/31/2023 1936   ALT 12 10/31/2012 0821   ALKPHOS 75 07/31/2023 1936   ALKPHOS 82 10/31/2012 0821   BILITOT 0.8 07/31/2023 1936   BILITOT 0.4 03/05/2021 1020   BILITOT 0.37 10/31/2012 0821   GFRNONAA >60 08/05/2023 0431   GFRAA 99 09/11/2019 1331      Latest Ref Rng & Units 08/05/2023    4:31 AM 08/03/2023   11:46 AM 08/01/2023    4:13 AM  BMP  Glucose 70 - 99 mg/dL 90  886  892   BUN 8 - 23 mg/dL 11  9  13    Creatinine 0.44 - 1.00 mg/dL 9.48  9.50  9.47   Sodium 135 - 145 mmol/L 139  135  138    Potassium 3.5 - 5.1 mmol/L 3.8  3.0  3.6   Chloride 98 - 111 mmol/L 105  100  108   CO2 22 - 32 mmol/L 26  23  21    Calcium  8.9 - 10.3 mg/dL 8.3  8.1  8.3        Component Value Date/Time   WBC 6.7 08/03/2023 1146   RBC 4.31 08/03/2023 1146   HGB 12.2 08/03/2023 1146   HGB 14.0 03/05/2021 1020   HGB 14.7 10/31/2012 0821   HCT 36.6 08/03/2023 1146   HCT 43.3 03/05/2021 1020   HCT 43.8 10/31/2012 0821   PLT 158 08/03/2023 1146   PLT 265 03/05/2021 1020   MCV 84.9 08/03/2023 1146   MCV 86 03/05/2021 1020   MCV 87.1 10/31/2012 0821   MCH 28.3 08/03/2023 1146   MCHC 33.3 08/03/2023 1146   RDW 15.0 08/03/2023 1146   RDW 14.0 03/05/2021 1020   RDW 14.2 10/31/2012 0821   LYMPHSABS 1.2 07/31/2023 1936   LYMPHSABS 1.3 10/31/2012 0821   MONOABS 0.9 07/31/2023 1936   MONOABS 0.6 10/31/2012 0821   EOSABS 0.0 07/31/2023 1936   EOSABS 0.2 10/31/2012 0821   EOSABS 0.2 11/09/2007 1054   BASOSABS 0.0 07/31/2023 1936   BASOSABS  0.0 10/31/2012 9178      Parts of this note may have been dictated using voice recognition software. There may be variances in spelling and vocabulary which are unintentional. Not all errors are proofread. Please notify the dino if any discrepancies are noted or if the meaning of any statement is not clear.

## 2023-10-02 NOTE — Telephone Encounter (Signed)
  FYI Only or Action Required?: Action required by provider: update on patient condition.  Patient was last seen in primary care on 09/15/2023 by Randeen Laine LABOR, MD.  Called Nurse Triage reporting Insect Bite.  Symptoms began several days ago.  Interventions attempted: OTC medications: benadryl.  Symptoms are: gradually improving.  Triage Disposition: Home Care  Patient/caregiver understands and will follow disposition?: Yes  Copied from CRM #[id number]. Topic: Clinical - Red Word Triage >> Oct 02, 2023  9:29 AM Treva T wrote: [facility] that prompted transfer to Nurse Triage: Received call from patient states she got an insect bite on this past Friday, on hairline of forehead, and now experiencing swelling in that area that has radiated down to eye area, and is moving down facial area. Reason for Disposition  All other insect bites  Answer Assessment - Initial Assessment Questions States that oral benadryl is improving symptoms   1. TYPE of INSECT: What type of insect was it?      unknown 2. ONSET: When did you get bitten?      Friday, three days ago 3. LOCATION: Where is the insect bite located?      Forehead at hairline 4. REDNESS: Is the area red or pink? If Yes, ask: What size is the area of redness? (inches or cm). When did the redness start?     denies 5. PAIN: Is there any pain? If Yes, ask: How bad is the pain? (Scale 0-10; or none, mild, moderate, severe)     Described as minimal 4/10 6. ITCHING: Does it itch? If Yes, ask: How bad is the itch?      minimal 7. SWELLING: How big is the swelling? (e.g., inches, cm, or compare to coins)     Left eye and face swelling 8. OTHER SYMPTOMS: Do you have any other symptoms?  (e.g., difficulty breathing, fever, hives)     denies 9. PREGNANCY: Is there any chance you are pregnant? When was your last menstrual period?     N/A  Protocols used: Insect Bite-A-AH

## 2024-01-09 ENCOUNTER — Telehealth: Payer: Self-pay | Admitting: *Deleted

## 2024-01-09 DIAGNOSIS — Z853 Personal history of malignant neoplasm of breast: Secondary | ICD-10-CM

## 2024-01-09 NOTE — Telephone Encounter (Signed)
 I placed the order  Please print and I can sign  Thanks

## 2024-01-09 NOTE — Telephone Encounter (Signed)
 Copied from CRM #[id number]. Topic: Clinical - Order For Equipment >> Jan 09, 2024  1:39 PM Avram MATSU wrote: Reason for CRM: patient needs a order for breast prothesis sent to [facility]  [phone]  Phone:[phone]

## 2024-01-11 NOTE — Telephone Encounter (Signed)
 Order faxed.

## 2024-01-22 ENCOUNTER — Ambulatory Visit: Payer: Self-pay

## 2024-01-22 NOTE — Telephone Encounter (Signed)
 FYI Only or Action Required?: FYI only for provider: appointment scheduled on 01/24/24.  Patient was last seen in primary care on 09/15/2023 by Randeen Laine LABOR, MD.  Called Nurse Triage reporting Cough.  Symptoms began a week ago.  Interventions attempted: OTC medications: Mucinex , NyQuil and Rest, hydration, or home remedies.  Symptoms are: gradually improving.  Triage Disposition: See Within 3 Days in Office (overriding Home Care)  Patient/caregiver understands and will follow disposition?: Yes   Copied from CRM #[id number]. Topic: Clinical - Red Word Triage >> Jan 22, 2024  2:59 PM Harlene ORN wrote: Red Word that prompted transfer to Nurse Triage: Cold getting worse since Thursday last week. Cold is spreading to her chest. Mild lightheadedness. Reason for Disposition  Cough with cold symptoms (e.g., runny nose, postnasal drip, throat clearing)  Answer Assessment - Initial Assessment Questions Request appointment on 01/24/24-scheduled. She will call back if symptoms are worsening.   History of pna   1. ONSET: When did the cough begin?      Thursday 2. SEVERITY: How bad is the cough today?      Painful yesterday, better today 3. SPUTUM: Describe the color of your sputum (e.g., none, dry cough; clear, white, yellow, green)     Scant light yellow  4. HEMOPTYSIS: Are you coughing up any blood? If Yes, ask: How much? (e.g., flecks, streaks, tablespoons, etc.)     denies 5. DIFFICULTY BREATHING: Are you having difficulty breathing? If Yes, ask: How bad is it? (e.g., mild, moderate, severe)      Denies-on CPAP at night  6. FEVER: Do you have a fever? If Yes, ask: What is your temperature, how was it measured, and when did it start?     denies 7. CARDIAC HISTORY: Do you have any history of heart disease? (e.g., heart attack, congestive heart failure)       8. LUNG HISTORY: Do you have any history of lung disease?  (e.g., pulmonary embolus, asthma, emphysema)       9. PE RISK FACTORS: Do you have a history of blood clots? (or: recent major surgery, recent prolonged travel, bedridden)      10. OTHER SYMPTOMS: Do you have any other symptoms? (e.g., runny nose, wheezing, chest pain)       Intermittent mild lightheadedness  11. PREGNANCY: Is there any chance you are pregnant? When was your last menstrual period?        12. TRAVEL: Have you traveled out of the country in the last month? (e.g., travel history, exposures)  Protocols used: Cough - Acute Productive-A-AH

## 2024-01-22 NOTE — Telephone Encounter (Signed)
 Aware, will watch for correspondence Thanks for seeing her

## 2024-01-22 NOTE — Telephone Encounter (Signed)
 Next Appt With Family Medicine Darra Ring, MD) 01/24/2024 at 10:40 AM

## 2024-01-24 ENCOUNTER — Ambulatory Visit: Admitting: Family Medicine

## 2024-01-24 ENCOUNTER — Encounter: Payer: Self-pay | Admitting: Family Medicine

## 2024-01-24 VITALS — BP 118/80 | HR 85 | Temp 97.9°F | Ht 68.0 in | Wt 237.2 lb

## 2024-01-24 DIAGNOSIS — J069 Acute upper respiratory infection, unspecified: Secondary | ICD-10-CM | POA: Diagnosis not present

## 2024-01-24 NOTE — Assessment & Plan Note (Signed)
 Acute, improving Most likely acute upper respiratory infection due to virus.  No clear sign of bacterial superinfection. Recommend continued nasal saline irrigation, Flonase 2 sprays per nostril daily and Mucinex  DM. Recommend rest and fluids.  If not improving as expected she will notify. Return and ER precautions provided.  If severe shortness of breath go to the emergency room.

## 2024-01-24 NOTE — Progress Notes (Signed)
 Patient ID: Lauren [name], female    DOB: 10/14/1945, 78 y.o.   MRN: [id number]  This visit was conducted in person.  BP 118/80   Pulse 85   Temp 97.9 F (36.6 C) (Oral)   Ht 5' 8 (1.727 m)   Wt 237 lb 4 oz (107.6 kg)   SpO2 97%   BMI 36.07 kg/m    CC:  Chief Complaint  Patient presents with   Medical Management of Chronic Issues    Pt CC cough, sneezing, and feeling lightheaded x 1 wk. Feeling better today. Pt took mucinex  dm. NyQuil at night.    Subjective:   HPI: Lauren [name] is a 78 y.o. female presenting on 01/24/2024 for Medical Management of Chronic Issues (Pt CC cough, sneezing, and feeling lightheaded x 1 wk. Feeling better today. Pt took mucinex  dm. NyQuil at night.)  [facility]  Date of onset:   1 week Initial symptoms included  nasal congestion Symptoms progressed to chest congestion, cough, sneeze.  She feels better today.. less cough, less chest congestion in last 12 hours.   Sick contacts:  none COVID testing:   none     She has tried to treat with  Muceinx DM and Nyquil     No history of chronic lung disease such as asthma or COPD.  HX of PNA 08/2023, collapsed lung Non-smoker.       Relevant past medical, surgical, family and social history reviewed and updated as indicated. Interim medical history since our last visit reviewed. Allergies and medications reviewed and updated. Outpatient Medications Prior to Visit  Medication Sig Dispense Refill   acetaminophen  (TYLENOL ) 500 MG tablet Take 1,000 mg by mouth every 8 (eight) hours as needed for moderate pain (pain score 4-6).     apixaban  (ELIQUIS ) 5 MG TABS tablet Take 1 tablet (5 mg total) by mouth 2 (two) times daily. 180 tablet 3   DULoxetine  (CYMBALTA ) 30 MG capsule Take 1 capsule (30 mg total) by mouth daily. 90 capsule 3   Evolocumab  (REPATHA  SURECLICK) 140 MG/ML SOAJ INJECT ONE DOSE SUBCUTANEOUSLY EVERY 14 DAYS 6 mL 3   gabapentin  (NEURONTIN ) 300 MG capsule Take 2 by mouth in am and 3 in  pm 450 capsule 2   methocarbamol  (ROBAXIN ) 500 MG tablet Take 1 tablet (500 mg total) by mouth 2 (two) times daily as needed for muscle spasms. Cauttion of sedation 90 tablet 1   metoprolol  tartrate (LOPRESSOR ) 25 MG tablet Take 1 tablet (25 mg total) by mouth 2 (two) times daily. 180 tablet 3   No facility-administered medications prior to visit.     Per HPI unless specifically indicated in ROS section below Review of Systems  Constitutional:  Negative for fatigue and fever.  HENT:  Positive for congestion.   Eyes:  Negative for pain.  Respiratory:  Positive for cough. Negative for shortness of breath.   Cardiovascular:  Negative for chest pain, palpitations and leg swelling.  Gastrointestinal:  Negative for abdominal pain.  Genitourinary:  Negative for dysuria and vaginal bleeding.  Musculoskeletal:  Negative for back pain.  Neurological:  Negative for syncope, light-headedness and headaches.  Psychiatric/Behavioral:  Negative for dysphoric mood.    Objective:  BP 118/80   Pulse 85   Temp 97.9 F (36.6 C) (Oral)   Ht 5' 8 (1.727 m)   Wt 237 lb 4 oz (107.6 kg)   SpO2 97%   BMI 36.07 kg/m   Wt Readings from Last 3 Encounters:  01/24/24  237 lb 4 oz (107.6 kg)  10/02/23 236 lb (107 kg)  09/15/23 234 lb (106.1 kg)      Physical Exam Constitutional:      General: She is not in acute distress.    Appearance: She is well-developed. She is not ill-appearing or toxic-appearing.  HENT:     Head: Normocephalic.     Right Ear: Hearing, tympanic membrane, ear canal and external ear normal. Tympanic membrane is not erythematous, retracted or bulging.     Left Ear: Hearing, tympanic membrane, ear canal and external ear normal. Tympanic membrane is not erythematous, retracted or bulging.     Nose: Mucosal edema and rhinorrhea present.     Right Sinus: No maxillary sinus tenderness or frontal sinus tenderness.     Left Sinus: No maxillary sinus tenderness or frontal sinus tenderness.      Mouth/Throat:     Pharynx: Uvula midline.  Eyes:     General: Lids are normal. Lids are everted, no foreign bodies appreciated.     Conjunctiva/sclera: Conjunctivae normal.     Pupils: Pupils are equal, round, and reactive to light.  Neck:     Thyroid : No thyroid  mass or thyromegaly.     Vascular: No carotid bruit.     Trachea: Trachea normal.  Cardiovascular:     Rate and Rhythm: Normal rate and regular rhythm.     Pulses: Normal pulses.     Heart sounds: Normal heart sounds, S1 normal and S2 normal. No murmur heard.    No friction rub. No gallop.  Pulmonary:     Effort: Pulmonary effort is normal. No tachypnea or respiratory distress.     Breath sounds: Normal breath sounds. No decreased breath sounds, wheezing, rhonchi or rales.  Musculoskeletal:     Cervical back: Normal range of motion and neck supple.  Skin:    General: Skin is warm and dry.     Findings: No rash.  Neurological:     Mental Status: She is alert.  Psychiatric:        Mood and Affect: Mood is not anxious or depressed.        Speech: Speech normal.        Behavior: Behavior normal. Behavior is cooperative.        Judgment: Judgment normal.       Results for orders placed or performed in visit on 10/02/23  TSH + free T4   Collection Time: 10/02/23  3:21 PM  Result Value Ref Range   TSH W/REFLEX TO FT4 0.63 0.40 - 4.50 mIU/L  T3, free   Collection Time: 10/02/23  3:21 PM  Result Value Ref Range   T3, Free 3.3 2.3 - 4.2 pg/mL    Assessment and Plan  Viral URI Assessment & Plan:  Acute, improving Most likely acute upper respiratory infection due to virus.  No clear sign of bacterial superinfection. Recommend continued nasal saline irrigation, Flonase 2 sprays per nostril daily and Mucinex  DM. Recommend rest and fluids.  If not improving as expected she will notify. Return and ER precautions provided.  If severe shortness of breath go to the emergency room.     No follow-ups on file.   Greig Ring, MD

## 2024-02-06 ENCOUNTER — Other Ambulatory Visit: Payer: Self-pay | Admitting: Medical

## 2024-03-15 ENCOUNTER — Ambulatory Visit: Payer: Medicare Other

## 2024-04-02 ENCOUNTER — Ambulatory Visit: Admitting: Medical

## 2024-04-03 ENCOUNTER — Ambulatory Visit: Admitting: "Endocrinology

## 2024-04-19 ENCOUNTER — Encounter: Admitting: Family Medicine

## 2024-06-06 ENCOUNTER — Ambulatory Visit
